# Patient Record
Sex: Female | Born: 1970
Health system: Southern US, Community
[De-identification: ages and names within clinical notes are randomized; demographics above are authoritative.]

## PROBLEM LIST (undated history)

## (undated) DIAGNOSIS — R1310 Dysphagia, unspecified: Secondary | ICD-10-CM

## (undated) DIAGNOSIS — F32A Depression, unspecified: Secondary | ICD-10-CM

## (undated) DIAGNOSIS — I10 Essential (primary) hypertension: Secondary | ICD-10-CM

## (undated) DIAGNOSIS — K5900 Constipation, unspecified: Secondary | ICD-10-CM

## (undated) DIAGNOSIS — R51 Headache: Secondary | ICD-10-CM

## (undated) DIAGNOSIS — M2550 Pain in unspecified joint: Secondary | ICD-10-CM

## (undated) DIAGNOSIS — G4733 Obstructive sleep apnea (adult) (pediatric): Secondary | ICD-10-CM

## (undated) DIAGNOSIS — F410 Panic disorder [episodic paroxysmal anxiety] without agoraphobia: Secondary | ICD-10-CM

## (undated) DIAGNOSIS — M549 Dorsalgia, unspecified: Secondary | ICD-10-CM

## (undated) DIAGNOSIS — F419 Anxiety disorder, unspecified: Secondary | ICD-10-CM

## (undated) DIAGNOSIS — R519 Headache, unspecified: Secondary | ICD-10-CM

## (undated) DIAGNOSIS — R079 Chest pain, unspecified: Secondary | ICD-10-CM

## (undated) DIAGNOSIS — E88819 Insulin resistance, unspecified: Secondary | ICD-10-CM

## (undated) DIAGNOSIS — R609 Edema, unspecified: Secondary | ICD-10-CM

## (undated) DIAGNOSIS — R0602 Shortness of breath: Secondary | ICD-10-CM

## (undated) DIAGNOSIS — R112 Nausea with vomiting, unspecified: Secondary | ICD-10-CM

## (undated) DIAGNOSIS — M1990 Unspecified osteoarthritis, unspecified site: Secondary | ICD-10-CM

## (undated) DIAGNOSIS — E039 Hypothyroidism, unspecified: Secondary | ICD-10-CM

## (undated) DIAGNOSIS — E8881 Metabolic syndrome: Secondary | ICD-10-CM

## (undated) DIAGNOSIS — K219 Gastro-esophageal reflux disease without esophagitis: Secondary | ICD-10-CM

## (undated) DIAGNOSIS — G43909 Migraine, unspecified, not intractable, without status migrainosus: Secondary | ICD-10-CM

## (undated) DIAGNOSIS — F329 Major depressive disorder, single episode, unspecified: Secondary | ICD-10-CM

## (undated) DIAGNOSIS — Z9889 Other specified postprocedural states: Secondary | ICD-10-CM

## (undated) HISTORY — DX: Chest pain, unspecified: R07.9

## (undated) HISTORY — DX: Depression, unspecified: F32.A

## (undated) HISTORY — DX: Headache, unspecified: R51.9

## (undated) HISTORY — DX: Gastro-esophageal reflux disease without esophagitis: K21.9

## (undated) HISTORY — DX: Obstructive sleep apnea (adult) (pediatric): G47.33

## (undated) HISTORY — DX: Headache: R51

## (undated) HISTORY — DX: Hypothyroidism, unspecified: E03.9

## (undated) HISTORY — PX: MINOR CARPAL TUNNEL: SHX6472

## (undated) HISTORY — DX: Unspecified osteoarthritis, unspecified site: M19.90

## (undated) HISTORY — DX: Major depressive disorder, single episode, unspecified: F32.9

## (undated) HISTORY — DX: Pain in unspecified joint: M25.50

## (undated) HISTORY — DX: Panic disorder (episodic paroxysmal anxiety): F41.0

## (undated) HISTORY — PX: WISDOM TOOTH EXTRACTION: SHX21

## (undated) HISTORY — DX: Dysphagia, unspecified: R13.10

## (undated) HISTORY — DX: Edema, unspecified: R60.9

## (undated) HISTORY — DX: Insulin resistance, unspecified: E88.819

## (undated) HISTORY — DX: Metabolic syndrome: E88.81

## (undated) HISTORY — PX: ELBOW SURGERY: SHX618

## (undated) HISTORY — DX: Anxiety disorder, unspecified: F41.9

## (undated) HISTORY — DX: Dorsalgia, unspecified: M54.9

## (undated) HISTORY — DX: Shortness of breath: R06.02

## (undated) HISTORY — DX: Constipation, unspecified: K59.00

## (undated) HISTORY — DX: Migraine, unspecified, not intractable, without status migrainosus: G43.909

---

## 2012-07-14 DIAGNOSIS — M129 Arthropathy, unspecified: Secondary | ICD-10-CM | POA: Insufficient documentation

## 2012-07-14 DIAGNOSIS — R112 Nausea with vomiting, unspecified: Secondary | ICD-10-CM | POA: Insufficient documentation

## 2012-07-14 DIAGNOSIS — R252 Cramp and spasm: Secondary | ICD-10-CM | POA: Insufficient documentation

## 2012-07-14 DIAGNOSIS — B349 Viral infection, unspecified: Secondary | ICD-10-CM | POA: Insufficient documentation

## 2012-07-14 DIAGNOSIS — R197 Diarrhea, unspecified: Secondary | ICD-10-CM | POA: Insufficient documentation

## 2013-03-26 ENCOUNTER — Ambulatory Visit: Payer: BC Managed Care – PPO | Admitting: Physical Therapy

## 2013-04-01 ENCOUNTER — Ambulatory Visit: Payer: BC Managed Care – PPO | Attending: Family Medicine | Admitting: Rehabilitation

## 2013-04-01 DIAGNOSIS — M6281 Muscle weakness (generalized): Secondary | ICD-10-CM | POA: Diagnosis not present

## 2013-04-01 DIAGNOSIS — IMO0001 Reserved for inherently not codable concepts without codable children: Secondary | ICD-10-CM | POA: Diagnosis present

## 2013-04-01 DIAGNOSIS — M25569 Pain in unspecified knee: Secondary | ICD-10-CM | POA: Diagnosis not present

## 2013-04-04 ENCOUNTER — Ambulatory Visit: Payer: BC Managed Care – PPO | Admitting: Rehabilitation

## 2013-04-08 ENCOUNTER — Ambulatory Visit: Payer: BC Managed Care – PPO | Attending: Family Medicine | Admitting: Rehabilitation

## 2013-04-08 DIAGNOSIS — M6281 Muscle weakness (generalized): Secondary | ICD-10-CM | POA: Insufficient documentation

## 2013-04-08 DIAGNOSIS — M25569 Pain in unspecified knee: Secondary | ICD-10-CM | POA: Diagnosis not present

## 2013-04-08 DIAGNOSIS — IMO0001 Reserved for inherently not codable concepts without codable children: Secondary | ICD-10-CM | POA: Insufficient documentation

## 2013-04-11 ENCOUNTER — Ambulatory Visit: Payer: BC Managed Care – PPO | Admitting: Rehabilitation

## 2013-04-11 DIAGNOSIS — IMO0001 Reserved for inherently not codable concepts without codable children: Secondary | ICD-10-CM | POA: Diagnosis not present

## 2013-04-15 ENCOUNTER — Ambulatory Visit: Payer: BC Managed Care – PPO | Admitting: Rehabilitation

## 2013-04-18 ENCOUNTER — Ambulatory Visit: Payer: BC Managed Care – PPO | Admitting: Rehabilitation

## 2013-04-18 DIAGNOSIS — IMO0001 Reserved for inherently not codable concepts without codable children: Secondary | ICD-10-CM | POA: Diagnosis not present

## 2013-04-22 ENCOUNTER — Ambulatory Visit: Payer: BC Managed Care – PPO | Admitting: Rehabilitation

## 2013-04-23 ENCOUNTER — Ambulatory Visit: Payer: BC Managed Care – PPO | Admitting: Rehabilitation

## 2013-04-23 DIAGNOSIS — IMO0001 Reserved for inherently not codable concepts without codable children: Secondary | ICD-10-CM | POA: Diagnosis not present

## 2013-04-25 ENCOUNTER — Ambulatory Visit: Payer: BC Managed Care – PPO | Admitting: Rehabilitation

## 2013-04-30 ENCOUNTER — Encounter: Payer: BC Managed Care – PPO | Admitting: Rehabilitation

## 2013-05-03 ENCOUNTER — Ambulatory Visit: Payer: BC Managed Care – PPO | Admitting: Rehabilitation

## 2013-05-08 ENCOUNTER — Ambulatory Visit: Payer: BC Managed Care – PPO | Attending: Family Medicine | Admitting: Rehabilitation

## 2013-05-08 DIAGNOSIS — IMO0001 Reserved for inherently not codable concepts without codable children: Secondary | ICD-10-CM | POA: Insufficient documentation

## 2013-05-08 DIAGNOSIS — M6281 Muscle weakness (generalized): Secondary | ICD-10-CM | POA: Insufficient documentation

## 2013-05-08 DIAGNOSIS — M25569 Pain in unspecified knee: Secondary | ICD-10-CM | POA: Insufficient documentation

## 2013-05-14 ENCOUNTER — Ambulatory Visit: Payer: BC Managed Care – PPO | Admitting: Rehabilitation

## 2013-05-20 ENCOUNTER — Ambulatory Visit: Payer: BC Managed Care – PPO | Admitting: Rehabilitation

## 2013-05-23 ENCOUNTER — Ambulatory Visit: Payer: BC Managed Care – PPO | Admitting: Rehabilitation

## 2013-05-27 ENCOUNTER — Ambulatory Visit: Payer: BC Managed Care – PPO | Admitting: Rehabilitation

## 2013-05-30 ENCOUNTER — Ambulatory Visit: Payer: BC Managed Care – PPO | Admitting: Rehabilitation

## 2013-06-13 DIAGNOSIS — S92009A Unspecified fracture of unspecified calcaneus, initial encounter for closed fracture: Secondary | ICD-10-CM | POA: Insufficient documentation

## 2013-09-12 DIAGNOSIS — M2240 Chondromalacia patellae, unspecified knee: Secondary | ICD-10-CM | POA: Insufficient documentation

## 2013-12-12 DIAGNOSIS — R12 Heartburn: Secondary | ICD-10-CM | POA: Insufficient documentation

## 2013-12-12 DIAGNOSIS — R519 Headache, unspecified: Secondary | ICD-10-CM | POA: Insufficient documentation

## 2013-12-12 DIAGNOSIS — I10 Essential (primary) hypertension: Secondary | ICD-10-CM | POA: Insufficient documentation

## 2013-12-12 DIAGNOSIS — R51 Headache: Secondary | ICD-10-CM

## 2015-04-30 DIAGNOSIS — G5600 Carpal tunnel syndrome, unspecified upper limb: Secondary | ICD-10-CM | POA: Insufficient documentation

## 2015-04-30 DIAGNOSIS — G5603 Carpal tunnel syndrome, bilateral upper limbs: Secondary | ICD-10-CM | POA: Insufficient documentation

## 2015-07-07 ENCOUNTER — Encounter (HOSPITAL_BASED_OUTPATIENT_CLINIC_OR_DEPARTMENT_OTHER): Payer: Self-pay | Admitting: Emergency Medicine

## 2015-07-07 ENCOUNTER — Emergency Department (HOSPITAL_BASED_OUTPATIENT_CLINIC_OR_DEPARTMENT_OTHER): Payer: Self-pay

## 2015-07-07 ENCOUNTER — Emergency Department (HOSPITAL_BASED_OUTPATIENT_CLINIC_OR_DEPARTMENT_OTHER)
Admission: EM | Admit: 2015-07-07 | Discharge: 2015-07-07 | Disposition: A | Discharge status: Home / Self Care | Payer: Self-pay | Attending: Emergency Medicine | Admitting: Emergency Medicine

## 2015-07-07 DIAGNOSIS — I10 Essential (primary) hypertension: Secondary | ICD-10-CM | POA: Insufficient documentation

## 2015-07-07 DIAGNOSIS — R079 Chest pain, unspecified: Secondary | ICD-10-CM | POA: Insufficient documentation

## 2015-07-07 DIAGNOSIS — Z79899 Other long term (current) drug therapy: Secondary | ICD-10-CM | POA: Insufficient documentation

## 2015-07-07 HISTORY — DX: Essential (primary) hypertension: I10

## 2015-07-07 LAB — BASIC METABOLIC PANEL
Anion gap: 5 (ref 5–15)
BUN: 14 mg/dL (ref 6–20)
CO2: 27 mmol/L (ref 22–32)
Calcium: 8.7 mg/dL — ABNORMAL LOW (ref 8.9–10.3)
Chloride: 106 mmol/L (ref 101–111)
Creatinine, Ser: 0.81 mg/dL (ref 0.44–1.00)
GFR calc Af Amer: >60 mL/min (ref >60)
GFR calc non Af Amer: >60 mL/min (ref >60)
Glucose, Bld: 76 mg/dL (ref 65–99)
Potassium: 3.6 mmol/L (ref 3.5–5.1)
Sodium: 138 mmol/L (ref 135–145)

## 2015-07-07 LAB — CBC
HCT: 37.9 % (ref 36.0–46.0)
Hemoglobin: 12.8 g/dL (ref 12.0–15.0)
MCH: 29.4 pg (ref 26.0–34.0)
MCHC: 33.8 g/dL (ref 30.0–36.0)
MCV: 87.1 fL (ref 78.0–100.0)
Platelets: 301 10*3/uL (ref 150–400)
RBC: 4.35 MIL/uL (ref 3.87–5.11)
RDW: 13.5 % (ref 11.5–15.5)
WBC: 5 10*3/uL (ref 4.0–10.5)

## 2015-07-07 LAB — TROPONIN I: Troponin I: <0.03 ng/mL (ref <0.031)

## 2015-07-07 MED ORDER — ASPIRIN 81 MG PO CHEW
324.0000 mg | CHEWABLE_TABLET | Freq: Once | ORAL | Status: AC
  Administered 2015-07-07: 324 mg via ORAL
  Filled 2015-07-07: qty 4

## 2015-07-07 NOTE — ED Notes (Signed)
Pt states she did not feel well this am.  Started to have centralized chest pain radiating to left arm.  Pt states she did have diaphoresis and nausea.

## 2015-07-07 NOTE — Discharge Instructions (Signed)
Nonspecific Chest Pain  °Chest pain can be caused by many different conditions. There is always a chance that your pain could be related to something serious, such as a heart attack or a blood clot in your lungs. Chest pain can also be caused by conditions that are not life-threatening. If you have chest pain, it is very important to follow up with your health care provider. °CAUSES  °Chest pain can be caused by: °· Heartburn. °· Pneumonia or bronchitis. °· Anxiety or stress. °· Inflammation around your heart (pericarditis) or lung (pleuritis or pleurisy). °· A blood clot in your lung. °· A collapsed lung (pneumothorax). It can develop suddenly on its own (spontaneous pneumothorax) or from trauma to the chest. °· Shingles infection (varicella-zoster virus). °· Heart attack. °· Damage to the bones, muscles, and cartilage that make up your chest wall. This can include: °¨ Bruised bones due to injury. °¨ Strained muscles or cartilage due to frequent or repeated coughing or overwork. °¨ Fracture to one or more ribs. °¨ Sore cartilage due to inflammation (costochondritis). °RISK FACTORS  °Risk factors for chest pain may include: °· Activities that increase your risk for trauma or injury to your chest. °· Respiratory infections or conditions that cause frequent coughing. °· Medical conditions or overeating that can cause heartburn. °· Heart disease or family history of heart disease. °· Conditions or health behaviors that increase your risk of developing a blood clot. °· Having had chicken pox (varicella zoster). °SIGNS AND SYMPTOMS °Chest pain can feel like: °· Burning or tingling on the surface of your chest or deep in your chest. °· Crushing, pressure, aching, or squeezing pain. °· Dull or sharp pain that is worse when you move, cough, or take a deep breath. °· Pain that is also felt in your back, neck, shoulder, or arm, or pain that spreads to any of these areas. °Your chest pain may come and go, or it may stay  constant. °DIAGNOSIS °Lab tests or other studies may be needed to find the cause of your pain. Your health care provider may have you take a test called an ambulatory ECG (electrocardiogram). An ECG records your heartbeat patterns at the time the test is performed. You may also have other tests, such as: °· Transthoracic echocardiogram (TTE). During echocardiography, sound waves are used to create a picture of all of the heart structures and to look at how blood flows through your heart. °· Transesophageal echocardiogram (TEE). This is a more advanced imaging test that obtains images from inside your body. It allows your health care provider to see your heart in finer detail. °· Cardiac monitoring. This allows your health care provider to monitor your heart rate and rhythm in real time. °· Holter monitor. This is a portable device that records your heartbeat and can help to diagnose abnormal heartbeats. It allows your health care provider to track your heart activity for several days, if needed. °· Stress tests. These can be done through exercise or by taking medicine that makes your heart beat more quickly. °· Blood tests. °· Imaging tests. °TREATMENT  °Your treatment depends on what is causing your chest pain. Treatment may include: °· Medicines. These may include: °¨ Acid blockers for heartburn. °¨ Anti-inflammatory medicine. °¨ Pain medicine for inflammatory conditions. °¨ Antibiotic medicine, if an infection is present. °¨ Medicines to dissolve blood clots. °¨ Medicines to treat coronary artery disease. °· Supportive care for conditions that do not require medicines. This may include: °¨ Resting. °¨ Applying heat   or cold packs to injured areas. °¨ Limiting activities until pain decreases. °HOME CARE INSTRUCTIONS °· If you were prescribed an antibiotic medicine, finish it all even if you start to feel better. °· Avoid any activities that bring on chest pain. °· Do not use any tobacco products, including  cigarettes, chewing tobacco, or electronic cigarettes. If you need help quitting, ask your health care provider. °· Do not drink alcohol. °· Take medicines only as directed by your health care provider. °· Keep all follow-up visits as directed by your health care provider. This is important. This includes any further testing if your chest pain does not go away. °· If heartburn is the cause for your chest pain, you may be told to keep your head raised (elevated) while sleeping. This reduces the chance that acid will go from your stomach into your esophagus. °· Make lifestyle changes as directed by your health care provider. These may include: °¨ Getting regular exercise. Ask your health care provider to suggest some activities that are safe for you. °¨ Eating a heart-healthy diet. A registered dietitian can help you to learn healthy eating options. °¨ Maintaining a healthy weight. °¨ Managing diabetes, if necessary. °¨ Reducing stress. °SEEK MEDICAL CARE IF: °· Your chest pain does not go away after treatment. °· You have a rash with blisters on your chest. °· You have a fever. °SEEK IMMEDIATE MEDICAL CARE IF:  °· Your chest pain is worse. °· You have an increasing cough, or you cough up blood. °· You have severe abdominal pain. °· You have severe weakness. °· You faint. °· You have chills. °· You have sudden, unexplained chest discomfort. °· You have sudden, unexplained discomfort in your arms, back, neck, or jaw. °· You have shortness of breath at any time. °· You suddenly start to sweat, or your skin gets clammy. °· You feel nauseous or you vomit. °· You suddenly feel light-headed or dizzy. °· Your heart begins to beat quickly, or it feels like it is skipping beats. °These symptoms may represent a serious problem that is an emergency. Do not wait to see if the symptoms will go away. Get medical help right away. Call your local emergency services (911 in the [place] Do not drive yourself to the hospital. °  °This  information is not intended to replace advice given to you by your health care provider. Make sure you discuss any questions you have with your health care provider. °  °Document Released: 11/03/2004 Document Revised: 02/14/2014 Document Reviewed: 08/30/2013 °Elsevier Interactive Patient Education ©2016 [employer] ° °

## 2015-07-07 NOTE — ED Provider Notes (Signed)
CSN: [id number]     Arrival date & time 07/07/15  1008 History   First MD Initiated Contact with Patient 07/07/15 1028     Chief Complaint  Patient presents with  . Chest Pain     (Consider location/radiation/quality/duration/timing/severity/associated sxs/prior Treatment) HPI   43yF with CP. 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 Pressure in the center/L chest and radiated into LUE. Associated with dyspnea. Says is felt like she could not get a deep breath. Also was sweaty and felt nauseated. Symptoms resolved over about 30 minutes. Currently feels better but not quite back to normal. Denies continued pain or dyspnea. Hx of HTN. No known CAD. Her mother has had a heart attack but she is unsure of age when diagnosed with heart disease. Non smoker. No cough. No unusual leg pain or swelling.  Past Medical History  Diagnosis Date  . Hypertension   . Thyroid disease    Past Surgical History  Procedure Laterality Date  . Cesarean section     No family history on file. Social History  Substance Use Topics  . Smoking status: Never Smoker   . Smokeless tobacco: None  . Alcohol Use: None   OB History    No data available     Review of Systems  All systems reviewed and negative, other than as noted in HPI.   Allergies  Sulfa antibiotics  Home Medications   Prior to Admission medications   Medication Sig Start Date End Date Taking? Authorizing Provider  hydrochlorothiazide (HYDRODIURIL) 25 MG tablet Take 25 mg by mouth daily.   Yes Historical Provider, MD  levothyroxine (SYNTHROID, LEVOTHROID) 100 MCG tablet Take 100 mcg by mouth daily before breakfast.   Yes Historical Provider, MD  lisinopril (PRINIVIL,ZESTRIL) 10 MG tablet Take 10 mg by mouth daily.   Yes Historical Provider, MD  omeprazole (PRILOSEC) 20 MG capsule Take 20 mg by mouth daily.   Yes Historical Provider, MD  UNKNOWN TO PATIENT Birth control   Yes Historical Provider, MD   BP 136/84  mmHg  Pulse 58  Temp(Src) 98.5 F (36.9 C) (Oral)  Resp 8  Ht 5\' 2"  (1.575 m)  Wt 279 lb 11.2 oz (126.871 kg)  BMI 51.14 kg/m2  SpO2 100%  LMP 06/23/2015 (Approximate) Physical Exam  Constitutional: She appears well-developed and well-nourished. No distress.  Laying in bed. NAD. Obese.  HENT:  Head: Normocephalic and atraumatic.  Eyes: Conjunctivae are normal. Right eye exhibits no discharge. Left eye exhibits no discharge.  Neck: Neck supple.  Cardiovascular: Normal rate, regular rhythm and normal heart sounds.  Exam reveals no gallop and no friction rub.   No murmur heard. Pulmonary/Chest: Effort normal and breath sounds normal. No respiratory distress.  Abdominal: Soft. She exhibits no distension. There is no tenderness.  Musculoskeletal: She exhibits no edema or tenderness.  Lower extremities symmetric as compared to each other. No calf tenderness. Negative Homan's. No palpable cords.   Neurological: She is alert.  Skin: Skin is warm and dry.  Psychiatric: She has a normal mood and affect. Her behavior is normal. Thought content normal.  Nursing note and vitals reviewed.   ED Course  Procedures (including critical care time) Labs Review Labs Reviewed  BASIC METABOLIC PANEL - Abnormal; Notable for the following:    Calcium 8.7 (*)    All other components within normal limits  CBC  TROPONIN I    Imaging Review Dg Chest 2 View  07/07/2015  CLINICAL DATA:  Chest tightness and left arm pain with shortness of breath, initial encounter EXAM: CHEST  2 VIEW COMPARISON:  None. FINDINGS: The heart size and mediastinal contours are within normal limits. Both lungs are clear. The visualized skeletal structures are unremarkable. IMPRESSION: No active cardiopulmonary disease. Electronically Signed   By: Inez Catalina M.D.   On: 07/07/2015 11:10   I have personally reviewed and evaluated these images and lab results as part of my medical decision-making.   EKG  Interpretation   Date/Time:  Tuesday Jul 07 2015 10:15:14 EDT Ventricular Rate:  70 PR Interval:  164 QRS Duration: 78 QT Interval:  422 QTC Calculation: 455 R Axis:   28 Text Interpretation:  Normal sinus rhythm Low voltage QRS No old tracing  to compare Confirmed by Wilson Singer  MD, Pegram ([id number]) on 07/07/2015 10:29:29 AM  Also confirmed by Wilson Singer  MD, Kerrigan Glendening ([id number]), editor Yehuda Mao  [phone])  on 07/07/2015 10:46:40 AM      MDM   Final diagnoses:  Chest pain, unspecified chest pain type    44yF with CP. Initial work-up unremarkable, but her HPI has several concerning elements. Advised admission for r/o. She declines. She understands my concerns for possible ACS and the benefit of staying and risks of leaving including but not limited to MI/death. Advised to get a stress test as soon as she can arrange. Return to ED immediately for any concerns symptoms in mean time.   Virgel Manifold, MD 07/08/15 [phone]

## 2015-07-14 ENCOUNTER — Encounter (HOSPITAL_BASED_OUTPATIENT_CLINIC_OR_DEPARTMENT_OTHER): Payer: Self-pay | Admitting: *Deleted

## 2015-07-14 ENCOUNTER — Emergency Department (HOSPITAL_BASED_OUTPATIENT_CLINIC_OR_DEPARTMENT_OTHER)
Admission: EM | Admit: 2015-07-14 | Discharge: 2015-07-14 | Disposition: A | Discharge status: Home / Self Care | Payer: BLUE CROSS/BLUE SHIELD | Attending: Emergency Medicine | Admitting: Emergency Medicine

## 2015-07-14 ENCOUNTER — Emergency Department (HOSPITAL_BASED_OUTPATIENT_CLINIC_OR_DEPARTMENT_OTHER): Payer: BLUE CROSS/BLUE SHIELD

## 2015-07-14 DIAGNOSIS — Z79899 Other long term (current) drug therapy: Secondary | ICD-10-CM | POA: Diagnosis not present

## 2015-07-14 DIAGNOSIS — I10 Essential (primary) hypertension: Secondary | ICD-10-CM | POA: Diagnosis not present

## 2015-07-14 DIAGNOSIS — E079 Disorder of thyroid, unspecified: Secondary | ICD-10-CM | POA: Insufficient documentation

## 2015-07-14 DIAGNOSIS — M79632 Pain in left forearm: Secondary | ICD-10-CM | POA: Diagnosis not present

## 2015-07-14 MED ORDER — NAPROXEN 250 MG PO TABS
500.0000 mg | ORAL_TABLET | Freq: Once | ORAL | Status: AC
  Administered 2015-07-14: 500 mg via ORAL
  Filled 2015-07-14: qty 2

## 2015-07-14 NOTE — ED Provider Notes (Signed)
CSN: [id number]     Arrival date & time 07/14/15  1335 History   First MD Initiated Contact with Patient 07/14/15 1417     Chief Complaint  Patient presents with  . Arm Pain     (Consider location/radiation/quality/duration/timing/severity/associated sxs/prior Treatment) HPI 45 year old female who presents with left forearm and hand pain. History of hypertension and thyroid disease. States several month history of intermittent numbness of the left arm and one week of increased pain in the left hand and arm. States for several months, she would have tingling and "falling asleep" of her left forearm randomly while her arm is at rest. She would move it and shake it, and it would improve. Her doctor told her to wear a brace, but this feeling would intermittently continue. She was seen in ED one week ago with chest pain after building a grill. At time, had forearm pain. Left AMA, and states chest pain resolved. Continued left forearm pain, described as soreness, worse with palpation and movement. Denies numbness or true weakness of the hand, but states hand seems stiffer and difficult to move and she noticed that hand is mildly more swollen than the right.  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   Past Medical History  Diagnosis Date  . Hypertension   . Thyroid disease    Past Surgical History  Procedure Laterality Date  . Cesarean section     No family history on file. Social History  Substance Use Topics  . Smoking status: Never Smoker   . Smokeless tobacco: None  . Alcohol Use: No   OB History    No data available     Review of Systems 10/14 systems reviewed and are negative other than those stated in the HPI   Allergies  Sulfa antibiotics  Home Medications   Prior to Admission medications   Medication Sig Start Date End Date Taking? Authorizing Provider  hydrochlorothiazide (HYDRODIURIL) 25 MG tablet Take 25 mg by mouth daily.     Historical Provider, MD  levothyroxine (SYNTHROID, LEVOTHROID) 100 MCG tablet Take 100 mcg by mouth daily before breakfast.    Historical Provider, MD  lisinopril (PRINIVIL,ZESTRIL) 10 MG tablet Take 10 mg by mouth daily.    Historical Provider, MD  omeprazole (PRILOSEC) 20 MG capsule Take 20 mg by mouth daily.    Historical Provider, MD  UNKNOWN TO PATIENT Birth control    Historical Provider, MD   BP 155/105 mmHg  Pulse 73  Temp(Src) 98.6 F (37 C) (Oral)  Resp 18  Ht 5\' 2"  (1.575 m)  Wt 279 lb (126.554 kg)  BMI 51.02 kg/m2  SpO2 99%  LMP 06/23/2015 (Approximate) Physical Exam Physical Exam  Nursing note and vitals reviewed. Constitutional: Well developed, well nourished, non-toxic, and in no acute distress Head: Normocephalic and atraumatic.  Mouth/Throat: Oropharynx is clear and moist.  Neck: Normal range of motion. Neck supple.  Cardiovascular: Normal rate and regular rhythm.  +2 radial pulse Pulmonary/Chest: Effort normal and breath sounds normal.  Abdominal: Soft. There is no tenderness. There is no rebound and no guarding.  Musculoskeletal: Normal range of motion with pain with forearm supination/pronation and soreness of wrist and fingers with ROM. Tender to palpation of forearm muscles.  Neurological: Alert, no facial droop, fluent speech, Sensation intact in all 4 extremities, no dysmetria with finger to nose, no pronator drift, intact innervation involving radial, ulnar, and median nerves bilaterally. Skin: Skin is warm and dry.  Psychiatric:  Cooperative  ED Course  Procedures (including critical care time) Labs Review Labs Reviewed - No data to display  Imaging Review No results found. I have personally reviewed and evaluated these images and lab results as part of my medical decision-making.   EKG Interpretation None      MDM   Final diagnoses:  None    45 year old female with presents with one week of left arm pain and soreness. On presentation  well-appearing in no acute distress with non-concerning vital signs. She has normal neurological exam. History and exam is not suggestive of true numbness and weakness of the left arm. She does have some intermittent tingling of the left forearm in the setting of likely peripheral nerve compression when she does not move her arm, but this sensation goes away when she moves her arm. She does reproducible pain with palpation of her forearm arm muscles as well as forearm supination/pronation and wrist and finger extension and flexion. This is all musculoskeletal pain likely in the setting of her repetitive activity building grills recently. She does have some minimal soft tissue swelling of the left hand. Extremity is neurovascularly in tact. With mild swelling [name] for DVT performed and negative. She is stable for discharge home with continued supportive care. Strict return and follow-up instructions are reviewed. She expressed understanding of all discharge instructions, and felt comfortable with the plan of care.    Forde Dandy, MD 07/14/15 [phone]

## 2015-07-14 NOTE — Discharge Instructions (Signed)
Avoid repetitive activity with the left arm. Ice or apply heat packs. Take NSAIDs and tylenol for pain control. Return for worsening symptoms, including worsening swelling or pain, or any other symptoms concerning to you.  Musculoskeletal Pain Musculoskeletal pain is muscle and boney aches and pains. These pains can occur in any part of the body. Your caregiver may treat you without knowing the cause of the pain. They may treat you if blood or urine tests, X-rays, and other tests were normal.  CAUSES There is often not a definite cause or reason for these pains. These pains may be caused by a type of germ (virus). The discomfort may also come from overuse. Overuse includes working out too hard when your body is not fit. Boney aches also come from weather changes. Bone is sensitive to atmospheric pressure changes. HOME CARE INSTRUCTIONS   Ask when your test results will be ready. Make sure you get your test results.  Only take over-the-counter or prescription medicines for pain, discomfort, or fever as directed by your caregiver. If you were given medications for your condition, do not drive, operate machinery or power tools, or sign legal documents for 24 hours. Do not drink alcohol. Do not take sleeping pills or other medications that may interfere with treatment.  Continue all activities unless the activities cause more pain. When the pain lessens, slowly resume normal activities. Gradually increase the intensity and duration of the activities or exercise.  During periods of severe pain, bed rest may be helpful. Lay or sit in any position that is comfortable.  Putting ice on the injured area.  Put ice in a bag.  Place a towel between your skin and the bag.  Leave the ice on for 15 to 20 minutes, 3 to 4 times a day.  Follow up with your caregiver for continued problems and no reason can be found for the pain. If the pain becomes worse or does not go away, it may be necessary to repeat tests  or do additional testing. Your caregiver may need to look further for a possible cause. SEEK IMMEDIATE MEDICAL CARE IF:  You have pain that is getting worse and is not relieved by medications.  You develop chest pain that is associated with shortness or breath, sweating, feeling sick to your stomach (nauseous), or throw up (vomit).  Your pain becomes localized to the abdomen.  You develop any new symptoms that seem different or that concern you. MAKE SURE YOU:   Understand these instructions.  Will watch your condition.  Will get help right away if you are not doing well or get worse.   This information is not intended to replace advice given to you by your health care provider. Make sure you discuss any questions you have with your health care provider.   Document Released: 01/24/2005 Document Revised: 04/18/2011 Document Reviewed: 09/28/2012 Elsevier Interactive Patient Education [employer].

## 2015-07-14 NOTE — ED Notes (Signed)
Left arm pain for a week. She has been numbness on and off in same arm for 2 days with weak grip. No injury. She was seen for same pain along with chest pain a week ago and had a negative exam.

## 2015-07-21 ENCOUNTER — Telehealth: Payer: Self-pay | Admitting: *Deleted

## 2015-07-21 ENCOUNTER — Encounter: Payer: Self-pay | Admitting: *Deleted

## 2015-07-21 NOTE — Telephone Encounter (Signed)
Lost connection on call, called pt back and pt did not answer. LM to return call to complete Pre-Visit Info if available.

## 2015-07-22 ENCOUNTER — Encounter: Payer: Self-pay | Admitting: Physician Assistant

## 2015-07-22 ENCOUNTER — Ambulatory Visit (INDEPENDENT_AMBULATORY_CARE_PROVIDER_SITE_OTHER): Payer: BLUE CROSS/BLUE SHIELD | Admitting: Physician Assistant

## 2015-07-22 ENCOUNTER — Telehealth: Payer: Self-pay | Admitting: Physician Assistant

## 2015-07-22 VITALS — BP 148/98 | HR 67 | Temp 97.6°F | Resp 16 | Ht 62.0 in | Wt 282.2 lb

## 2015-07-22 DIAGNOSIS — G5602 Carpal tunnel syndrome, left upper limb: Secondary | ICD-10-CM

## 2015-07-22 DIAGNOSIS — Z8249 Family history of ischemic heart disease and other diseases of the circulatory system: Secondary | ICD-10-CM | POA: Diagnosis not present

## 2015-07-22 DIAGNOSIS — R079 Chest pain, unspecified: Secondary | ICD-10-CM

## 2015-07-22 DIAGNOSIS — M7711 Lateral epicondylitis, right elbow: Secondary | ICD-10-CM

## 2015-07-22 DIAGNOSIS — E039 Hypothyroidism, unspecified: Secondary | ICD-10-CM | POA: Diagnosis not present

## 2015-07-22 DIAGNOSIS — I10 Essential (primary) hypertension: Secondary | ICD-10-CM | POA: Diagnosis not present

## 2015-07-22 DIAGNOSIS — M7710 Lateral epicondylitis, unspecified elbow: Secondary | ICD-10-CM | POA: Insufficient documentation

## 2015-07-22 LAB — COMPREHENSIVE METABOLIC PANEL
ALBUMIN: 3.8 g/dL (ref 3.5–5.2)
ALT: 14 U/L (ref 0–35)
AST: 13 U/L (ref 0–37)
Alkaline Phosphatase: 46 U/L (ref 39–117)
BUN: 12 mg/dL (ref 6–23)
CHLORIDE: 106 meq/L (ref 96–112)
CO2: 27 meq/L (ref 19–32)
Calcium: 8.9 mg/dL (ref 8.4–10.5)
Creatinine, Ser: 0.74 mg/dL (ref 0.40–1.20)
GFR: 90.77 mL/min (ref >60.00)
Glucose, Bld: 93 mg/dL (ref 70–99)
POTASSIUM: 4.2 meq/L (ref 3.5–5.1)
SODIUM: 136 meq/L (ref 135–145)
Total Bilirubin: 0.6 mg/dL (ref 0.2–1.2)
Total Protein: 6.9 g/dL (ref 6.0–8.3)

## 2015-07-22 LAB — LIPID PANEL
CHOL/HDL RATIO: 3
Cholesterol: 130 mg/dL (ref 0–200)
HDL: 37.2 mg/dL — AB (ref >39.00)
LDL Cholesterol: 82 mg/dL (ref 0–99)
NonHDL: 92.73
TRIGLYCERIDES: 56 mg/dL (ref 0.0–149.0)
VLDL: 11.2 mg/dL (ref 0.0–40.0)

## 2015-07-22 LAB — TSH: TSH: 8.42 u[IU]/mL — AB (ref 0.35–4.50)

## 2015-07-22 LAB — HEMOGLOBIN A1C: Hgb A1c MFr Bld: 4.7 % (ref 4.6–6.5)

## 2015-07-22 MED ORDER — OMEPRAZOLE 20 MG PO CPDR: 20.0000 mg | DELAYED_RELEASE_CAPSULE | Freq: Every day | ORAL | Status: DC

## 2015-07-22 MED ORDER — DICLOFENAC POTASSIUM 50 MG PO TABS: 50.0000 mg | ORAL_TABLET | Freq: Two times a day (BID) | ORAL | Status: DC

## 2015-07-22 MED ORDER — HYDROCHLOROTHIAZIDE 25 MG PO TABS: 25.0000 mg | ORAL_TABLET | Freq: Every day | ORAL | Status: DC

## 2015-07-22 MED ORDER — LISINOPRIL 20 MG PO TABS: 20.0000 mg | ORAL_TABLET | Freq: Every day | ORAL | Status: DC

## 2015-07-22 NOTE — Patient Instructions (Signed)
Please go to the lab for blood work. We will call with your results.  We are making a few changes:  1 - For blood pressure -- your BP is too high with current medication regimen. Continue your hydrochlorothiazide. I am increasing your lisinopril to 20 mg daily. I have sent in a new prescription. Follow the diet at the bottom of this visit summary. We are setting you up for a stress test. This is very important.  2 - For weight -- we need to reassess your thyroid function. I suspect your medication will need an adjustment. I cannot start medications now for obesity until we get your heart checked out and BP under control. Keep up with diet and exercise.  3 - For Carpal tunnel -- wear the wrist brace at night. Avoid heavy lifting. Take the diclofenac as directed with food. Apply topical Salon Pas to the area. If not improving we will have to set you up with a hand surgery.  4 - For Elbow -- Avoid heavy lifting. Apply Salon Pas to the area. The Diclofenac will also help with this. Let me know if this is not improving.  Follow-up with me in 2 weeks.  Don't forget to ask your HR about FMLA paperwork to be caregiver for your boyfriend.  DASH Eating Plan DASH stands for "Dietary Approaches to Stop Hypertension." The DASH eating plan is a healthy eating plan that has been shown to reduce high blood pressure (hypertension). Additional health benefits may include reducing the risk of type 2 diabetes mellitus, heart disease, and stroke. The DASH eating plan may also help with weight loss. WHAT DO I NEED TO KNOW ABOUT THE DASH EATING PLAN? For the DASH eating plan, you will follow these general guidelines:  Choose foods with a percent daily value for sodium of less than 5% (as listed on the food label).  Use salt-free seasonings or herbs instead of table salt or sea salt.  Check with your health care provider or pharmacist before using salt substitutes.  Eat lower-sodium products, often labeled as  "lower sodium" or "no salt added."  Eat fresh foods.  Eat more vegetables, fruits, and low-fat dairy products.  Choose whole grains. Look for the word "whole" as the first word in the ingredient list.  Choose fish and skinless chicken or [place] more often than red meat. Limit fish, poultry, and meat to 6 oz (170 g) each day.  Limit sweets, desserts, sugars, and sugary drinks.  Choose heart-healthy fats.  Limit cheese to 1 oz (28 g) per day.  Eat more home-cooked food and less restaurant, buffet, and fast food.  Limit fried foods.  Cook foods using methods other than frying.  Limit canned vegetables. If you do use them, rinse them well to decrease the sodium.  When eating at a restaurant, ask that your food be prepared with less salt, or no salt if possible. WHAT FOODS CAN I EAT? Seek help from a dietitian for individual calorie needs. Grains Whole grain or whole wheat bread. Brown rice. Whole grain or whole wheat pasta. Quinoa, bulgur, and whole grain cereals. Low-sodium cereals. Corn or whole wheat flour tortillas. Whole grain cornbread. Whole grain crackers. Low-sodium crackers. Vegetables Fresh or frozen vegetables (raw, steamed, roasted, or grilled). Low-sodium or reduced-sodium tomato and vegetable juices. Low-sodium or reduced-sodium tomato sauce and paste. Low-sodium or reduced-sodium canned vegetables.  Fruits All fresh, canned (in natural juice), or frozen fruits. Meat and Other Protein Products Ground beef (85% or leaner), grass-fed  beef, or beef trimmed of fat. Skinless chicken or [place]. Ground chicken or [place]. Pork trimmed of fat. All fish and seafood. Eggs. Dried beans, peas, or lentils. Unsalted nuts and seeds. Unsalted canned beans. Dairy Low-fat dairy products, such as skim or 1% milk, 2% or reduced-fat cheeses, low-fat ricotta or cottage cheese, or plain low-fat yogurt. Low-sodium or reduced-sodium cheeses. Fats and Oils Tub margarines without trans fats.  Light or reduced-fat mayonnaise and salad dressings (reduced sodium). Avocado. Safflower, olive, or canola oils. Natural peanut or almond butter. Other Unsalted popcorn and pretzels. The items listed above may not be a complete list of recommended foods or beverages. Contact your dietitian for more options. WHAT FOODS ARE NOT RECOMMENDED? Grains White bread. White pasta. White rice. Refined cornbread. Bagels and croissants. Crackers that contain trans fat. Vegetables Creamed or fried vegetables. Vegetables in a cheese sauce. Regular canned vegetables. Regular canned tomato sauce and paste. Regular tomato and vegetable juices. Fruits Dried fruits. Canned fruit in light or heavy syrup. Fruit juice. Meat and Other Protein Products Fatty cuts of meat. Ribs, chicken wings, bacon, sausage, bologna, salami, chitterlings, fatback, hot dogs, bratwurst, and packaged luncheon meats. Salted nuts and seeds. Canned beans with salt. Dairy Whole or 2% milk, cream, half-and-half, and cream cheese. Whole-fat or sweetened yogurt. Full-fat cheeses or blue cheese. Nondairy creamers and whipped toppings. Processed cheese, cheese spreads, or cheese curds. Condiments Onion and garlic salt, seasoned salt, table salt, and sea salt. Canned and packaged gravies. Worcestershire sauce. Tartar sauce. Barbecue sauce. Teriyaki sauce. Soy sauce, including reduced sodium. Steak sauce. Fish sauce. Oyster sauce. Cocktail sauce. Horseradish. Ketchup and mustard. Meat flavorings and tenderizers. Bouillon cubes. Hot sauce. Tabasco sauce. Marinades. Taco seasonings. Relishes. Fats and Oils Butter, stick margarine, lard, shortening, ghee, and bacon fat. Coconut, palm kernel, or palm oils. Regular salad dressings. Other Pickles and olives. Salted popcorn and pretzels. The items listed above may not be a complete list of foods and beverages to avoid. Contact your dietitian for more information. WHERE CAN I FIND MORE  INFORMATION? National Heart, Lung, and Blood Institute: travelstabloid.com   This information is not intended to replace advice given to you by your health care provider. Make sure you discuss any questions you have with your health care provider.   Document Released: 01/13/2011 Document Revised: 02/14/2014 Document Reviewed: 11/28/2012 Elsevier Interactive Patient Education [employer].

## 2015-07-22 NOTE — Telephone Encounter (Signed)
Thyroid lab is abnormal and patient would need urine pregnancy test before starting new Rx for birth control/SLS 06/14 [HCTZ and Omeprazole Rx request to pharmacy/SLS 06/14] Please Advise.

## 2015-07-22 NOTE — Telephone Encounter (Signed)
No refills were requested when patient was asked about refills at visit. As such no urine pregnancy was checked regarding birth control medications. She will have to give urine sample for urine pregnancy before I will refill.  In regards to the levothyroxine, her TSH was elevated. Need to increase to 125 mcg tablet daily. FU lab 6 weeks for repeat TSH. May have to tweak further. See result note for all other lab results from visit.

## 2015-07-22 NOTE — Progress Notes (Signed)
Pre visit review using our clinic review tool, if applicable. No additional management support is needed unless otherwise documented below in the visit note/SLS  

## 2015-07-22 NOTE — Telephone Encounter (Signed)
Relation to [id number] Call back [phone] Pharmacy: [id number] - [place], [place]  Reason for call:  Patient requesting a refill of the following medication:   hydrochlorothiazide (HYDRODIURIL) 25 MG tablet  norethindrone (JOLIVETTE) 0.35 MG tablet  levothyroxine (SYNTHROID, LEVOTHROID) 100 MCG tablet  omeprazole (PRILOSEC) 20 MG capsule

## 2015-07-22 NOTE — Progress Notes (Signed)
Patient presents to clinic today to establish care. Patient is also an ER follow-up -- 07/07/15 (chest pain), 07/14/15 (forearm pain).   Acute Concerns: Patient c/o pain in left wrist and forearm, sometimes radiating up to her elbow. Denies trauma or injury but does heavy lifting at work. Was seen in the ER this past week for this. Was told symptoms were muscular in nature. Supportive measures were reviewed with patient in the ER. Patient endorses hand is intermittent tingling with numbness in the first 3 fingers, sometimes the whole hand will go numb. Is alleviated if she shakes her hand. Endorses she was told before by another provider that she may have carpal tunnel syndrome.   Patient also c/o pain in R lateral elbow over the past week. Is only present with heavy lifting. Denies trauma or injury. Denies numbness or tingling. Has not taken anything for symptoms.   Chronic Issues: Hypertension -- Is currently on regimen of HCTZ 25 mg daily and Lisinopril 10 mg daily. Endorses taking daily as directed. Patient denies palpitations, lightheadedness, dizziness, vision changes. Does note frequent headaches. Mother just had another heart attack at 14. Strong family history of CAD. Patient denies history of DM or high cholesterol. Was seen in ER on 07/07/15 with an episode of chest pain radiating into LUE. Initial workup in ER -- EKG, CXR, troponin negative. Overnight monitoring and stress test recommended but patient left AMA. Denies recurrence of chest pain since that time.  Hypothyroidism -- Is currently on levothyroxine 100 mcg daily. Endorses taking as directed. Notes weight gain and fatigue. States she has not had TSH level checked in a few years.  Morbid Obesity -- Body mass index is 51.61 kg/(m^2). Is eating only baked foods. No fried of fast foods. Is working on fruits and veggie intake. Is drinking mostly water. Is cutting back on portion sizes. No exercise regimen at present.   Past Medical  History  Diagnosis Date  . Hypertension   . Thyroid disease   . GERD (gastroesophageal reflux disease)   . Arthritis   . Frequent headaches   . Migraines   . Panic anxiety syndrome   . Depression     Past Surgical History  Procedure Laterality Date  . Cesarean section  2001 & 2002  . Wisdom tooth extraction      Current Outpatient Prescriptions on File Prior to Visit  Medication Sig Dispense Refill  . norethindrone (JOLIVETTE) 0.35 MG tablet      No current facility-administered medications on file prior to visit.    Allergies  Allergen Reactions  . Adhesive [Tape] Other (See Comments)    Skin Burn.  . Sulfa Antibiotics Rash    Family History  Problem Relation Age of Onset  . Hypertension Mother     Living  . Hypertension Father     Stepfather  . Hypertension Maternal Grandmother   . Cancer Maternal Grandfather   . Hypertension Maternal Grandfather   . Hypertension Paternal Grandmother   . Hypertension Paternal Grandfather   . Arthritis Mother   . Thyroid disease Mother   . Heart disease Mother   . Heart attack Mother   . Parkinson's disease Maternal Grandmother   . Alzheimer's disease Maternal Grandmother   . Diabetes Neg Hx   . Gout Brother   . ADD / ADHD Son     x1  . Other Son     #2-Unknown    Social History   Social History  . Marital Status: Single  Spouse Name: N/A  . Number of Children: N/A  . Years of Education: N/A   Occupational History  . Not on file.   Social History Main Topics  . Smoking status: Former Research scientist (life sciences)  . Smokeless tobacco: Never Used     Comment: Quit >10 years ago  . Alcohol Use: No  . Drug Use: No  . Sexual Activity:    Partners: Female     Comment: OCP   Other Topics Concern  . Not on file   Social History Narrative    Review of Systems  Constitutional: Positive for malaise/fatigue. Negative for fever and weight loss.  HENT: Negative for ear discharge, ear pain, hearing loss and tinnitus.   Eyes:  Negative for blurred vision, double vision, photophobia and pain.  Respiratory: Negative for cough and shortness of breath.   Cardiovascular: Positive for chest pain. Negative for palpitations.  Gastrointestinal: Negative for heartburn, nausea, vomiting, abdominal pain, diarrhea, constipation, blood in stool and melena.  Genitourinary: Negative for dysuria, urgency, frequency, hematuria and flank pain.  Musculoskeletal: Positive for joint pain. Negative for falls.  Neurological: Positive for headaches. Negative for dizziness and loss of consciousness.  Endo/Heme/Allergies: Negative for environmental allergies.  Psychiatric/Behavioral: Negative for depression, suicidal ideas, hallucinations and substance abuse. The patient is not nervous/anxious and does not have insomnia.     BP 148/98 mmHg  Pulse 67  Temp(Src) 97.6 F (36.4 C) (Oral)  Resp 16  Ht _0  (1.575 m)  Wt 282 lb 4 oz (128.028 kg)  BMI 51.61 kg/m2  SpO2 98%  LMP 06/23/2015 (Approximate)  Physical Exam  Constitutional: She is oriented to person, place, and time and well-developed, well-nourished, and in no distress.  HENT:  Head: Normocephalic and atraumatic.  Right Ear: External ear normal.  Left Ear: External ear normal.  Nose: Nose normal.  Mouth/Throat: Oropharynx is clear and moist. No oropharyngeal exudate.  TM within normal limits bilaterally.  Eyes: Conjunctivae are normal. Pupils are equal, round, and reactive to light.  Neck: Neck supple. No thyromegaly present.  Cardiovascular: Normal rate, regular rhythm, normal heart sounds and intact distal pulses.   Pulmonary/Chest: Effort normal and breath sounds normal. No respiratory distress. She has no wheezes. She has no rales. She exhibits no tenderness.  Abdominal: Soft. Bowel sounds are normal. She exhibits no distension. There is no tenderness.  Musculoskeletal:       Right elbow: She exhibits normal range of motion and no swelling. Tenderness found. Lateral  epicondyle tenderness noted.       Left elbow: Normal.  Neurological: She is alert and oriented to person, place, and time.  Left upper extremity is neurovascularly intact. Sensation is present but patient unable to differentiate sharp and soft sensations in first three fingers of left hand only. + Tinel and Phalen sign on exam. No muscular atrophy noted.  Skin: Skin is warm and dry. No rash noted.  Vitals reviewed.   Recent Results (from the past 2160 hour(s))  Basic metabolic panel     Status: Abnormal   Collection Time: 07/07/15 10:30 AM  Result Value Ref Range   Sodium 138 135 - 145 mmol/L   Potassium 3.6 3.5 - 5.1 mmol/L   Chloride 106 101 - 111 mmol/L   CO2 27 22 - 32 mmol/L   Glucose, Bld 76 65 - 99 mg/dL   BUN 14 6 - 20 mg/dL   Creatinine, Ser 0.81 0.44 - 1.00 mg/dL   Calcium 8.7 (L) 8.9 - 10.3 mg/dL  GFR calc non Af Amer >60 >60 mL/min   GFR calc Af Amer >60 >60 mL/min    Comment: (NOTE) The eGFR has been calculated using the CKD EPI equation. This calculation has not been validated in all clinical situations. eGFR's persistently <60 mL/min signify possible Chronic Kidney Disease.    Anion gap 5 5 - 15  CBC     Status: None   Collection Time: 07/07/15 10:30 AM  Result Value Ref Range   WBC 5.0 4.0 - 10.5 K/uL   RBC 4.35 3.87 - 5.11 MIL/uL   Hemoglobin 12.8 12.0 - 15.0 g/dL   HCT 37.9 36.0 - 46.0 %   MCV 87.1 78.0 - 100.0 fL   MCH 29.4 26.0 - 34.0 pg   MCHC 33.8 30.0 - 36.0 g/dL   RDW 13.5 11.5 - 15.5 %   Platelets 301 150 - 400 K/uL  Troponin I     Status: None   Collection Time: 07/07/15 10:30 AM  Result Value Ref Range   Troponin I <0.03 <0.031 ng/mL    Comment:        NO INDICATION OF MYOCARDIAL INJURY.   Comp Met (CMET)     Status: None   Collection Time: 07/22/15  8:47 AM  Result Value Ref Range   Sodium 136 135 - 145 mEq/L   Potassium 4.2 3.5 - 5.1 mEq/L   Chloride 106 96 - 112 mEq/L   CO2 27 19 - 32 mEq/L   Glucose, Bld 93 70 - 99 mg/dL   BUN  12 6 - 23 mg/dL   Creatinine, Ser 0.74 0.40 - 1.20 mg/dL   Total Bilirubin 0.6 0.2 - 1.2 mg/dL   Alkaline Phosphatase 46 39 - 117 U/L   AST 13 0 - 37 U/L   ALT 14 0 - 35 U/L   Total Protein 6.9 6.0 - 8.3 g/dL   Albumin 3.8 3.5 - 5.2 g/dL   Calcium 8.9 8.4 - 10.5 mg/dL   GFR 90.77 >60.00 mL/min  TSH     Status: Abnormal   Collection Time: 07/22/15  8:47 AM  Result Value Ref Range   TSH 8.42 (H) 0.35 - 4.50 uIU/mL  Hemoglobin A1c     Status: None   Collection Time: 07/22/15  8:47 AM  Result Value Ref Range   Hgb A1c MFr Bld 4.7 4.6 - 6.5 %    Comment: Glycemic Control Guidelines for People with Diabetes:Non Diabetic:  <6%Goal of Therapy: <7%Additional Action Suggested:  >8%   Lipid panel     Status: Abnormal   Collection Time: 07/22/15  8:47 AM  Result Value Ref Range   Cholesterol 130 0 - 200 mg/dL    Comment: ATP III Classification       Desirable:  < 200 mg/dL               Borderline High:  200 - 239 mg/dL          High:  > = 240 mg/dL   Triglycerides 56.0 0.0 - 149.0 mg/dL    Comment: Normal:  <150 mg/dLBorderline High:  150 - 199 mg/dL   HDL 37.20 (L) >39.00 mg/dL   VLDL 11.2 0.0 - 40.0 mg/dL   LDL Cholesterol 82 0 - 99 mg/dL   Total CHOL/HDL Ratio 3     Comment:                Men          Women1/2 Average Risk  3.4          3.3Average Risk          5.0          4.42X Average Risk          9.6          7.13X Average Risk          15.0          11.0                       NonHDL 92.73     Comment: NOTE:  Non-HDL goal should be 30 mg/dL higher than patient's LDL goal (i.e. LDL goal of < 70 mg/dL, would have non-HDL goal of < 100 mg/dL)    Assessment/Plan: Hypertension BP above goal today. Lipids and Glycemic control unknown. + family history of early CAD. Patient with episode of chest pain for which she went to ER on 5/30. No recurrence but concern for angina. Did not complete recommended workup in the ER. Will continue HCTZ. Increase lisinopril to 20 mg daily. Diet and  exercise reviewed. Will check labs today. Stress test will be ordered to assess for CAD. Alarm signs.symptoms reviewed with patient that would prompt ER assessment.  Thyroid activity decreased Will check TSH today. Will alter dose based on results. Getting this under control will help some with weight.  Carpal tunnel syndrome Wear brace at nighttime. Rx diclofenac to help with inflammation. Supportive measures reviewed. Will need referral to hand specialist if symptoms are not improving.  Lateral epicondylitis Mild. Rx Diclofenac BID. Supportive measures and OTC medications reviewed. FU if not resolving.  Morbid obesity ([place]) Body mass index is 51.61 kg/(m^2). Unfortunately medications are not an option at present until heart is further assessed and BP is under control. Thyroid function likely contributing. Will check lab panel today. Stress testing ordered. Diet and exercise reviewed with patient. Once everything is assessed -- if all is well with stress test and we get BP and thyroid under control we can discuss medications. Nutrition referral recommended but patient declines at present.  Family history of early CAD Will check lipids, A1C and order stress test. BP medications as directed.      Leeanne Rio, PA-C

## 2015-07-23 ENCOUNTER — Other Ambulatory Visit: Payer: Self-pay | Admitting: *Deleted

## 2015-07-23 DIAGNOSIS — E039 Hypothyroidism, unspecified: Secondary | ICD-10-CM

## 2015-07-23 DIAGNOSIS — Z3040 Encounter for surveillance of contraceptives, unspecified: Secondary | ICD-10-CM

## 2015-07-23 MED ORDER — LEVOTHYROXINE SODIUM 125 MCG PO TABS: 125.0000 ug | ORAL_TABLET | Freq: Every day | ORAL | Status: DC

## 2015-07-23 NOTE — Telephone Encounter (Signed)
Pt notified of results and verbalized understanding. Medication filled to pharmacy as requested. 6 week lab appt scheduled and future lab orders placed, and lab appt scheduled tomorrow for urine pregnancy only.

## 2015-07-23 NOTE — Assessment & Plan Note (Signed)
Will check lipids, A1C and order stress test. BP medications as directed.

## 2015-07-23 NOTE — Assessment & Plan Note (Signed)
Mild. Rx Diclofenac BID. Supportive measures and OTC medications reviewed. FU if not resolving.

## 2015-07-23 NOTE — Assessment & Plan Note (Signed)
Body mass index is 51.61 kg/(m^2). Unfortunately medications are not an option at present until heart is further assessed and BP is under control. Thyroid function likely contributing. Will check lab panel today. Stress testing ordered. Diet and exercise reviewed with patient. Once everything is assessed -- if all is well with stress test and we get BP and thyroid under control we can discuss medications. Nutrition referral recommended but patient declines at present.

## 2015-07-23 NOTE — Assessment & Plan Note (Signed)
Wear brace at nighttime. Rx diclofenac to help with inflammation. Supportive measures reviewed. Will need referral to hand specialist if symptoms are not improving.

## 2015-07-23 NOTE — Assessment & Plan Note (Addendum)
BP above goal today. Lipids and Glycemic control unknown. + family history of early CAD. Patient with episode of chest pain for which she went to ER on 5/30. No recurrence but concern for angina. Did not complete recommended workup in the ER. Will continue HCTZ. Increase lisinopril to 20 mg daily. Diet and exercise reviewed. Will check labs today. Stress test will be ordered to assess for CAD. Alarm signs.symptoms reviewed with patient that would prompt ER assessment.

## 2015-07-23 NOTE — Assessment & Plan Note (Signed)
Will check TSH today. Will alter dose based on results. Getting this under control will help some with weight.

## 2015-07-24 ENCOUNTER — Other Ambulatory Visit: Payer: BLUE CROSS/BLUE SHIELD

## 2015-07-24 DIAGNOSIS — Z3040 Encounter for surveillance of contraceptives, unspecified: Secondary | ICD-10-CM

## 2015-07-25 LAB — PREGNANCY, URINE: Preg Test, Ur: NEGATIVE

## 2015-07-27 MED ORDER — NORETHINDRONE 0.35 MG PO TABS: 1.0000 | ORAL_TABLET | Freq: Every day | ORAL | Status: DC

## 2015-07-27 NOTE — Addendum Note (Signed)
Addended by: Tasia Catchings on: 07/27/2015 04:12 PM   Modules accepted: Orders

## 2015-07-29 ENCOUNTER — Encounter: Payer: Self-pay | Admitting: Physician Assistant

## 2015-07-29 ENCOUNTER — Ambulatory Visit (INDEPENDENT_AMBULATORY_CARE_PROVIDER_SITE_OTHER): Payer: BLUE CROSS/BLUE SHIELD | Admitting: Physician Assistant

## 2015-07-29 VITALS — Ht 62.0 in | Wt 274.5 lb

## 2015-07-29 DIAGNOSIS — G43809 Other migraine, not intractable, without status migrainosus: Secondary | ICD-10-CM | POA: Diagnosis not present

## 2015-07-29 MED ORDER — ONDANSETRON HCL 4 MG PO TABS
4.0000 mg | ORAL_TABLET | Freq: Once | ORAL | Status: AC
  Administered 2015-07-29: 4 mg via ORAL

## 2015-07-29 MED ORDER — ONDANSETRON HCL 4 MG PO TABS: 4.0000 mg | ORAL_TABLET | Freq: Three times a day (TID) | ORAL | Status: DC | PRN

## 2015-07-29 MED ORDER — SUMATRIPTAN SUCCINATE 50 MG PO TABS: 50.0000 mg | ORAL_TABLET | Freq: Once | ORAL | Status: DC

## 2015-07-29 MED ORDER — KETOROLAC TROMETHAMINE 60 MG/2ML IM SOLN
60.0000 mg | Freq: Once | INTRAMUSCULAR | Status: AC
  Administered 2015-07-29: 60 mg via INTRAMUSCULAR

## 2015-07-29 NOTE — Progress Notes (Signed)
Pre visit review using our clinic review tool, if applicable. No additional management support is needed unless otherwise documented below in the visit note/SLS  

## 2015-07-29 NOTE — Progress Notes (Signed)
Patient presents to clinic today c/o intermittent migraine headache over the past couple of days. Endorses recurrence since this morning with photophobia, phonophobia, nausea. Endorses one episode of non-bloody emesis this morning. No recurrence. Denies vision change, AMS, fever, neck stiffness. Has not taken anything today for symptoms.  Past Medical History  Diagnosis Date  . Hypertension   . Thyroid disease   . GERD (gastroesophageal reflux disease)   . Arthritis   . Frequent headaches   . Migraines   . Panic anxiety syndrome   . Depression     Current Outpatient Prescriptions on File Prior to Visit  Medication Sig Dispense Refill  . hydrochlorothiazide (HYDRODIURIL) 25 MG tablet Take 1 tablet (25 mg total) by mouth daily. 30 tablet 1  . levothyroxine (SYNTHROID, LEVOTHROID) 125 MCG tablet Take 1 tablet (125 mcg total) by mouth daily. 90 tablet 0  . lisinopril (PRINIVIL,ZESTRIL) 20 MG tablet Take 1 tablet (20 mg total) by mouth daily. 30 tablet 1  . norethindrone (JOLIVETTE) 0.35 MG tablet Take 1 tablet (0.35 mg total) by mouth daily. 1 Package 12  . omeprazole (PRILOSEC) 20 MG capsule Take 1 capsule (20 mg total) by mouth daily. 30 capsule 1   No current facility-administered medications on file prior to visit.    Allergies  Allergen Reactions  . Adhesive [Tape] Other (See Comments)    Skin Burn.  . Sulfa Antibiotics Rash    Family History  Problem Relation Age of Onset  . Hypertension Mother     Living  . Hypertension Father     Stepfather  . Hypertension Maternal Grandmother   . Cancer Maternal Grandfather   . Hypertension Maternal Grandfather   . Hypertension Paternal Grandmother   . Hypertension Paternal Grandfather   . Arthritis Mother   . Thyroid disease Mother   . Heart disease Mother   . Heart attack Mother   . Parkinson's disease Maternal Grandmother   . Alzheimer's disease Maternal Grandmother   . Diabetes Neg Hx   . Gout Brother   . ADD / ADHD  Son     x1  . Other Son     #2-Unknown    Social History   Social History  . Marital Status: Single    Spouse Name: N/A  . Number of Children: N/A  . Years of Education: N/A   Social History Main Topics  . Smoking status: Former Research scientist (life sciences)  . Smokeless tobacco: Never Used     Comment: Quit >10 years ago  . Alcohol Use: No  . Drug Use: No  . Sexual Activity:    Partners: Female     Comment: OCP   Other Topics Concern  . None   Social History Narrative    Review of Systems - See HPI.  All other ROS are negative.  Ht '5\' 2"'  (1.575 m)  Wt 274 lb 8 oz (124.512 kg)  BMI 50.19 kg/m2  LMP 06/23/2015 (Approximate)  Physical Exam  Constitutional: She is oriented to person, place, and time and well-developed, well-nourished, and in no distress.  HENT:  Head: Normocephalic and atraumatic.  Right Ear: External ear normal.  Left Ear: External ear normal.  Nose: Nose normal.  Mouth/Throat: Oropharynx is clear and moist. No oropharyngeal exudate.  Eyes: Conjunctivae are normal. Pupils are equal, round, and reactive to light.  Neck: Normal range of motion. Neck supple.  Cardiovascular: Normal rate, regular rhythm, normal heart sounds and intact distal pulses.   Pulmonary/Chest: Effort normal and breath sounds  normal. No respiratory distress. She has no wheezes. She has no rales. She exhibits no tenderness.  Neurological: She is alert and oriented to person, place, and time.  Skin: Skin is warm and dry. No rash noted.  Psychiatric: Affect normal.  Vitals reviewed.   Recent Results (from the past 2160 hour(s))  Basic metabolic panel     Status: Abnormal   Collection Time: 07/07/15 10:30 AM  Result Value Ref Range   Sodium 138 135 - 145 mmol/L   Potassium 3.6 3.5 - 5.1 mmol/L   Chloride 106 101 - 111 mmol/L   CO2 27 22 - 32 mmol/L   Glucose, Bld 76 65 - 99 mg/dL   BUN 14 6 - 20 mg/dL   Creatinine, Ser 0.81 0.44 - 1.00 mg/dL   Calcium 8.7 (L) 8.9 - 10.3 mg/dL   GFR calc non  Af Amer >60 >60 mL/min   GFR calc Af Amer >60 >60 mL/min    Comment: (NOTE) The eGFR has been calculated using the CKD EPI equation. This calculation has not been validated in all clinical situations. eGFR's persistently <60 mL/min signify possible Chronic Kidney Disease.    Anion gap 5 5 - 15  CBC     Status: None   Collection Time: 07/07/15 10:30 AM  Result Value Ref Range   WBC 5.0 4.0 - 10.5 K/uL   RBC 4.35 3.87 - 5.11 MIL/uL   Hemoglobin 12.8 12.0 - 15.0 g/dL   HCT 37.9 36.0 - 46.0 %   MCV 87.1 78.0 - 100.0 fL   MCH 29.4 26.0 - 34.0 pg   MCHC 33.8 30.0 - 36.0 g/dL   RDW 13.5 11.5 - 15.5 %   Platelets 301 150 - 400 K/uL  Troponin I     Status: None   Collection Time: 07/07/15 10:30 AM  Result Value Ref Range   Troponin I <0.03 <0.031 ng/mL    Comment:        NO INDICATION OF MYOCARDIAL INJURY.   Comp Met (CMET)     Status: None   Collection Time: 07/22/15  8:47 AM  Result Value Ref Range   Sodium 136 135 - 145 mEq/L   Potassium 4.2 3.5 - 5.1 mEq/L   Chloride 106 96 - 112 mEq/L   CO2 27 19 - 32 mEq/L   Glucose, Bld 93 70 - 99 mg/dL   BUN 12 6 - 23 mg/dL   Creatinine, Ser 0.74 0.40 - 1.20 mg/dL   Total Bilirubin 0.6 0.2 - 1.2 mg/dL   Alkaline Phosphatase 46 39 - 117 U/L   AST 13 0 - 37 U/L   ALT 14 0 - 35 U/L   Total Protein 6.9 6.0 - 8.3 g/dL   Albumin 3.8 3.5 - 5.2 g/dL   Calcium 8.9 8.4 - 10.5 mg/dL   GFR 90.77 >60.00 mL/min  TSH     Status: Abnormal   Collection Time: 07/22/15  8:47 AM  Result Value Ref Range   TSH 8.42 (H) 0.35 - 4.50 uIU/mL  Hemoglobin A1c     Status: None   Collection Time: 07/22/15  8:47 AM  Result Value Ref Range   Hgb A1c MFr Bld 4.7 4.6 - 6.5 %    Comment: Glycemic Control Guidelines for People with Diabetes:Non Diabetic:  <6%Goal of Therapy: <7%Additional Action Suggested:  >8%   Lipid panel     Status: Abnormal   Collection Time: 07/22/15  8:47 AM  Result Value Ref Range   Cholesterol 130  0 - 200 mg/dL    Comment: ATP III  Classification       Desirable:  < 200 mg/dL               Borderline High:  200 - 239 mg/dL          High:  > = 240 mg/dL   Triglycerides 56.0 0.0 - 149.0 mg/dL    Comment: Normal:  <150 mg/dLBorderline High:  150 - 199 mg/dL   HDL 37.20 (L) >39.00 mg/dL   VLDL 11.2 0.0 - 40.0 mg/dL   LDL Cholesterol 82 0 - 99 mg/dL   Total CHOL/HDL Ratio 3     Comment:                Men          Women1/2 Average Risk     3.4          3.3Average Risk          5.0          4.42X Average Risk          9.6          7.13X Average Risk          15.0          11.0                       NonHDL 92.73     Comment: NOTE:  Non-HDL goal should be 30 mg/dL higher than patient's LDL goal (i.e. LDL goal of < 70 mg/dL, would have non-HDL goal of < 100 mg/dL)  Pregnancy, urine     Status: None   Collection Time: 07/24/15  7:07 AM  Result Value Ref Range   Preg Test, Ur NEGATIVE NEGATIVE    Comment: The sensitivity for this methodology is >24 mIU/mL.       Assessment/Plan: 1. Other migraine without status migrainosus, not intractable IM Toradol given in office. Rx Zofran. Rx Imitrex to use for abortive therapy. Supportive measures and diet reviewed. FU if recurring, as we may have to consider preventative therapy and imaging.  - ketorolac (TORADOL) injection 60 mg; Inject 2 mLs (60 mg total) into the muscle once. - ondansetron (ZOFRAN) tablet 4 mg; Take 1 tablet (4 mg total) by mouth once.   Leeanne Rio, PA-C

## 2015-07-29 NOTE — Patient Instructions (Signed)
Please stay hydrated. Caffeine will help with headaches. If you develop another migraine, please take the Imitrex as directed.  If these headaches are becoming more frequent, then we will need to consider starting a preventive medication.

## 2015-07-30 ENCOUNTER — Telehealth: Payer: Self-pay | Admitting: Physician Assistant

## 2015-07-30 NOTE — Telephone Encounter (Signed)
Work note written and filed at front desk for pick up. Pt notified and states that her head is "sore." She feels that her symptoms are improving, but it is taking some time to recover from the migraine. She is no longer experiencing "pounding HA" and photophobia like she was at appt yesterday. Advised pt to schedule follow-up appt if her migraine symptoms recur.

## 2015-07-30 NOTE — Telephone Encounter (Signed)
I am out of office today. Hoyle Sauer, would you please extend work note through Friday. Also assess patient to see if she needs repeat OV with me tomorrow if symptoms are recurring. Thank you kindly.

## 2015-07-30 NOTE — Telephone Encounter (Signed)
Caller name: Aziana Relation to pt: self  Call back number: [phone] Pharmacy:  Reason for call: Pt called stating was seen yesterday 07-29-15 and was given a work letter to return to work today 07-30-15 but pt states still not feeling well and would like to have a work note stating to return on Monday 08-03-15. Pt is needing letter for today. Please advise ASAP.

## 2015-08-03 ENCOUNTER — Telehealth (HOSPITAL_COMMUNITY): Payer: Self-pay | Admitting: *Deleted

## 2015-08-03 NOTE — Telephone Encounter (Signed)
Left message on voicemail in reference to upcoming appointment scheduled for 08/05/15 Phone number given for a call back so details instructions can be given. Hubbard Robinson, RN

## 2015-08-05 ENCOUNTER — Ambulatory Visit (HOSPITAL_COMMUNITY): Payer: BLUE CROSS/BLUE SHIELD | Attending: Cardiology

## 2015-08-05 DIAGNOSIS — I10 Essential (primary) hypertension: Secondary | ICD-10-CM | POA: Diagnosis not present

## 2015-08-05 DIAGNOSIS — R079 Chest pain, unspecified: Secondary | ICD-10-CM

## 2015-08-05 DIAGNOSIS — R9439 Abnormal result of other cardiovascular function study: Secondary | ICD-10-CM | POA: Insufficient documentation

## 2015-08-05 DIAGNOSIS — Z8249 Family history of ischemic heart disease and other diseases of the circulatory system: Secondary | ICD-10-CM | POA: Diagnosis not present

## 2015-08-05 DIAGNOSIS — R0609 Other forms of dyspnea: Secondary | ICD-10-CM | POA: Insufficient documentation

## 2015-08-05 MED ORDER — TECHNETIUM TC 99M TETROFOSMIN IV KIT
32.8000 | PACK | Freq: Once | INTRAVENOUS | Status: AC | PRN
  Administered 2015-08-05: 32.8 via INTRAVENOUS
  Filled 2015-08-05: qty 33

## 2015-08-05 MED ORDER — REGADENOSON 0.4 MG/5ML IV SOLN
0.4000 mg | Freq: Once | INTRAVENOUS | Status: AC
  Administered 2015-08-05: 0.4 mg via INTRAVENOUS

## 2015-08-06 ENCOUNTER — Ambulatory Visit (HOSPITAL_COMMUNITY): Payer: BLUE CROSS/BLUE SHIELD | Attending: Cardiology

## 2015-08-06 LAB — MYOCARDIAL PERFUSION IMAGING
CHL CUP NUCLEAR SDS: 7
CHL CUP NUCLEAR SRS: 2
CHL CUP NUCLEAR SSS: 9
CSEPPHR: 102 {beats}/min
LHR: 0.26
LV dias vol: 87 mL (ref 46–106)
LV sys vol: 25 mL
Rest HR: 73 {beats}/min
TID: 0.78

## 2015-08-06 MED ORDER — TECHNETIUM TC 99M TETROFOSMIN IV KIT
32.3000 | PACK | Freq: Once | INTRAVENOUS | Status: AC | PRN
  Administered 2015-08-06: 32.3 via INTRAVENOUS
  Filled 2015-08-06: qty 32

## 2015-08-14 ENCOUNTER — Encounter: Payer: Self-pay | Admitting: Physician Assistant

## 2015-08-14 ENCOUNTER — Ambulatory Visit (INDEPENDENT_AMBULATORY_CARE_PROVIDER_SITE_OTHER): Payer: BLUE CROSS/BLUE SHIELD | Admitting: Physician Assistant

## 2015-08-14 VITALS — BP 142/96 | HR 81 | Temp 98.4°F | Resp 16 | Ht 62.0 in | Wt 279.4 lb

## 2015-08-14 DIAGNOSIS — G5602 Carpal tunnel syndrome, left upper limb: Secondary | ICD-10-CM

## 2015-08-14 NOTE — Patient Instructions (Signed)
Please keep your phone on. You will be contacted by Hand Surgery for further assessment. Please continue wearing braces as directed.

## 2015-08-15 ENCOUNTER — Other Ambulatory Visit: Payer: Self-pay | Admitting: Physician Assistant

## 2015-08-15 NOTE — Progress Notes (Signed)
Patient presents to clinic today for follow-up of carpal tunnel syndrome of L wrist. Patient endorses taking medication and wearing brace as directed without substantial improvement. Notes some decreased grip strength. Denies numbness at present but tingling has continued.   Past Medical History  Diagnosis Date  . Hypertension   . Thyroid disease   . GERD (gastroesophageal reflux disease)   . Arthritis   . Frequent headaches   . Migraines   . Panic anxiety syndrome   . Depression     Current Outpatient Prescriptions on File Prior to Visit  Medication Sig Dispense Refill  . hydrochlorothiazide (HYDRODIURIL) 25 MG tablet Take 1 tablet (25 mg total) by mouth daily. 30 tablet 1  . levothyroxine (SYNTHROID, LEVOTHROID) 125 MCG tablet Take 1 tablet (125 mcg total) by mouth daily. 90 tablet 0  . lisinopril (PRINIVIL,ZESTRIL) 20 MG tablet Take 1 tablet (20 mg total) by mouth daily. 30 tablet 1  . norethindrone (JOLIVETTE) 0.35 MG tablet Take 1 tablet (0.35 mg total) by mouth daily. 1 Package 12  . omeprazole (PRILOSEC) 20 MG capsule Take 1 capsule (20 mg total) by mouth daily. 30 capsule 1  . ondansetron (ZOFRAN) 4 MG tablet Take 1 tablet (4 mg total) by mouth every 8 (eight) hours as needed for nausea or vomiting. 20 tablet 0  . SUMAtriptan (IMITREX) 50 MG tablet Take 1 tablet (50 mg total) by mouth once. May repeat in 2 hours if headache persists or recurs. 10 tablet 0   No current facility-administered medications on file prior to visit.    Allergies  Allergen Reactions  . Adhesive [Tape] Other (See Comments)    Skin Burn.  [name] [Menthol (Topical Analgesic)] Other (See Comments)    Skin Burn.  . Sulfa Antibiotics Rash    Family History  Problem Relation Age of Onset  . Hypertension Mother     Living  . Hypertension Father     Stepfather  . Hypertension Maternal Grandmother   . Cancer Maternal Grandfather   . Hypertension Maternal Grandfather   . Hypertension  Paternal Grandmother   . Hypertension Paternal Grandfather   . Arthritis Mother   . Thyroid disease Mother   . Heart disease Mother   . Heart attack Mother   . Parkinson's disease Maternal Grandmother   . Alzheimer's disease Maternal Grandmother   . Diabetes Neg Hx   . Gout Brother   . ADD / ADHD Son     x1  . Other Son     #2-Unknown  . Migraines Mother     Social History   Social History  . Marital Status: Single    Spouse Name: N/A  . Number of Children: N/A  . Years of Education: N/A   Social History Main Topics  . Smoking status: Former Research scientist (life sciences)  . Smokeless tobacco: Never Used     Comment: Quit >10 years ago  . Alcohol Use: No  . Drug Use: No  . Sexual Activity:    Partners: Female     Comment: OCP   Other Topics Concern  . None   Social History Narrative    Review of Systems - See HPI.  All other ROS are negative.  BP 142/96 mmHg  Pulse 81  Temp(Src) 98.4 F (36.9 C) (Oral)  Resp 16  Ht _0  (1.575 m)  Wt 279 lb 6 oz (126.724 kg)  BMI 51.09 kg/m2  SpO2 98%  LMP 07/30/2015  Physical Exam  Constitutional: She is oriented  to person, place, and time and well-developed, well-nourished, and in no distress.  HENT:  Head: Normocephalic and atraumatic.  Eyes: Conjunctivae are normal.  Cardiovascular: Normal rate, regular rhythm, normal heart sounds and intact distal pulses.   Pulmonary/Chest: Effort normal.  Musculoskeletal:       Left hand: She exhibits normal range of motion and no tenderness. Decreased sensation noted. Decreased sensation is present in the radial distribution. Normal strength noted.  Neurological: She is alert and oriented to person, place, and time. She has normal sensation and normal strength.  Vitals reviewed.   Recent Results (from the past 2160 hour(s))  Basic metabolic panel     Status: Abnormal   Collection Time: 07/07/15 10:30 AM  Result Value Ref Range   Sodium 138 135 - 145 mmol/L   Potassium 3.6 3.5 - 5.1 mmol/L    Chloride 106 101 - 111 mmol/L   CO2 27 22 - 32 mmol/L   Glucose, Bld 76 65 - 99 mg/dL   BUN 14 6 - 20 mg/dL   Creatinine, Ser 0.81 0.44 - 1.00 mg/dL   Calcium 8.7 (L) 8.9 - 10.3 mg/dL   GFR calc non Af Amer >60 >60 mL/min   GFR calc Af Amer >60 >60 mL/min    Comment: (NOTE) The eGFR has been calculated using the CKD EPI equation. This calculation has not been validated in all clinical situations. eGFR's persistently <60 mL/min signify possible Chronic Kidney Disease.    Anion gap 5 5 - 15  CBC     Status: None   Collection Time: 07/07/15 10:30 AM  Result Value Ref Range   WBC 5.0 4.0 - 10.5 K/uL   RBC 4.35 3.87 - 5.11 MIL/uL   Hemoglobin 12.8 12.0 - 15.0 g/dL   HCT 37.9 36.0 - 46.0 %   MCV 87.1 78.0 - 100.0 fL   MCH 29.4 26.0 - 34.0 pg   MCHC 33.8 30.0 - 36.0 g/dL   RDW 13.5 11.5 - 15.5 %   Platelets 301 150 - 400 K/uL  Troponin I     Status: None   Collection Time: 07/07/15 10:30 AM  Result Value Ref Range   Troponin I <0.03 <0.031 ng/mL    Comment:        NO INDICATION OF MYOCARDIAL INJURY.   Comp Met (CMET)     Status: None   Collection Time: 07/22/15  8:47 AM  Result Value Ref Range   Sodium 136 135 - 145 mEq/L   Potassium 4.2 3.5 - 5.1 mEq/L   Chloride 106 96 - 112 mEq/L   CO2 27 19 - 32 mEq/L   Glucose, Bld 93 70 - 99 mg/dL   BUN 12 6 - 23 mg/dL   Creatinine, Ser 0.74 0.40 - 1.20 mg/dL   Total Bilirubin 0.6 0.2 - 1.2 mg/dL   Alkaline Phosphatase 46 39 - 117 U/L   AST 13 0 - 37 U/L   ALT 14 0 - 35 U/L   Total Protein 6.9 6.0 - 8.3 g/dL   Albumin 3.8 3.5 - 5.2 g/dL   Calcium 8.9 8.4 - 10.5 mg/dL   GFR 90.77 >60.00 mL/min  TSH     Status: Abnormal   Collection Time: 07/22/15  8:47 AM  Result Value Ref Range   TSH 8.42 (H) 0.35 - 4.50 uIU/mL  Hemoglobin A1c     Status: None   Collection Time: 07/22/15  8:47 AM  Result Value Ref Range   Hgb A1c MFr Bld 4.7  4.6 - 6.5 %    Comment: Glycemic Control Guidelines for People with Diabetes:Non Diabetic:  <6%Goal  of Therapy: <7%Additional Action Suggested:  >8%   Lipid panel     Status: Abnormal   Collection Time: 07/22/15  8:47 AM  Result Value Ref Range   Cholesterol 130 0 - 200 mg/dL    Comment: ATP III Classification       Desirable:  < 200 mg/dL               Borderline High:  200 - 239 mg/dL          High:  > = 240 mg/dL   Triglycerides 56.0 0.0 - 149.0 mg/dL    Comment: Normal:  <150 mg/dLBorderline High:  150 - 199 mg/dL   HDL 37.20 (L) >39.00 mg/dL   VLDL 11.2 0.0 - 40.0 mg/dL   LDL Cholesterol 82 0 - 99 mg/dL   Total CHOL/HDL Ratio 3     Comment:                Men          Women1/2 Average Risk     3.4          3.3Average Risk          5.0          4.42X Average Risk          9.6          7.13X Average Risk          15.0          11.0                       NonHDL 92.73     Comment: NOTE:  Non-HDL goal should be 30 mg/dL higher than patient's LDL goal (i.e. LDL goal of < 70 mg/dL, would have non-HDL goal of < 100 mg/dL)  Pregnancy, urine     Status: None   Collection Time: 07/24/15  7:07 AM  Result Value Ref Range   Preg Test, Ur NEGATIVE NEGATIVE    Comment: The sensitivity for this methodology is >24 mIU/mL.     Myocardial Perfusion Imaging     Status: None   Collection Time: 08/06/15 12:49 PM  Result Value Ref Range   Rest HR 73 bpm   Rest BP 95/72 mmHg   Exercise duration (min)  min   Exercise duration (sec)  sec   Estimated workload  METS   Peak HR 102 bpm   Peak BP 123/79 mmHg   MPHR  bpm   Percent HR  %   RPE     LV sys vol 25 mL   TID 0.78    LV dias vol 87 46 - 106 mL   LHR 0.26    SSS 9    SRS 2    SDS 7     Assessment/Plan: 1. Carpal tunnel syndrome, left Continue bracing and OTC medications. Referral placed to Hand Surgery for further management.  - Ambulatory referral to Hand Surgery   Leeanne Rio, PA-C

## 2015-08-20 ENCOUNTER — Encounter: Payer: Self-pay | Admitting: Physician Assistant

## 2015-08-20 ENCOUNTER — Other Ambulatory Visit: Payer: Self-pay | Admitting: Physician Assistant

## 2015-08-21 MED ORDER — ONDANSETRON HCL 4 MG PO TABS: 4.0000 mg | ORAL_TABLET | Freq: Three times a day (TID) | ORAL | Status: DC | PRN

## 2015-08-21 MED ORDER — LEVOTHYROXINE SODIUM 125 MCG PO TABS: 125.0000 ug | ORAL_TABLET | Freq: Every day | ORAL | Status: DC

## 2015-08-21 MED ORDER — SUMATRIPTAN SUCCINATE 50 MG PO TABS: 50.0000 mg | ORAL_TABLET | Freq: Once | ORAL | Status: DC

## 2015-09-04 ENCOUNTER — Other Ambulatory Visit (INDEPENDENT_AMBULATORY_CARE_PROVIDER_SITE_OTHER): Payer: BLUE CROSS/BLUE SHIELD

## 2015-09-04 DIAGNOSIS — E039 Hypothyroidism, unspecified: Secondary | ICD-10-CM

## 2015-09-04 LAB — TSH: TSH: 5.75 u[IU]/mL — AB (ref 0.35–4.50)

## 2015-09-07 ENCOUNTER — Telehealth: Payer: Self-pay | Admitting: *Deleted

## 2015-09-07 MED ORDER — LEVOTHYROXINE SODIUM 150 MCG PO TABS: 150.0000 ug | ORAL_TABLET | Freq: Every day | ORAL | 3 refills | Status: DC

## 2015-09-07 NOTE — Telephone Encounter (Signed)
Patient informed, understood & agreed; new Rx to pharmacy/SLS 07/31

## 2015-09-07 NOTE — Telephone Encounter (Signed)
-----   Message from Brunetta Jeans, PA-C sent at 09/06/2015  8:08 PM EDT ----- TSH improved but still above goal. Need to increase levothyroxine to 150 mcg daily. Ok to send in 30-day supply with 3 refills. Repeat TSH again in 6 weeks.

## 2015-09-28 ENCOUNTER — Other Ambulatory Visit: Payer: Self-pay | Admitting: Physician Assistant

## 2015-09-28 DIAGNOSIS — G5602 Carpal tunnel syndrome, left upper limb: Secondary | ICD-10-CM

## 2015-09-28 DIAGNOSIS — M7711 Lateral epicondylitis, right elbow: Secondary | ICD-10-CM

## 2015-09-28 NOTE — Telephone Encounter (Signed)
Rx request Denied; pt needs to contact office per provider; was D/C as Ineffective in July 2017/SLS 08/21

## 2015-10-16 ENCOUNTER — Encounter: Payer: Self-pay | Admitting: Physician Assistant

## 2015-10-16 ENCOUNTER — Ambulatory Visit (INDEPENDENT_AMBULATORY_CARE_PROVIDER_SITE_OTHER): Payer: BLUE CROSS/BLUE SHIELD | Admitting: Physician Assistant

## 2015-10-16 VITALS — BP 128/84 | HR 74 | Temp 98.2°F | Resp 16 | Ht 62.0 in | Wt 280.5 lb

## 2015-10-16 DIAGNOSIS — E039 Hypothyroidism, unspecified: Secondary | ICD-10-CM | POA: Diagnosis not present

## 2015-10-16 LAB — TSH: TSH: 0.99 u[IU]/mL (ref 0.35–4.50)

## 2015-10-16 MED ORDER — CITALOPRAM HYDROBROMIDE 10 MG PO TABS: 10.0000 mg | ORAL_TABLET | Freq: Every day | ORAL | 1 refills | Status: DC

## 2015-10-16 MED ORDER — PHENTERMINE HCL 15 MG PO CAPS: 15.0000 mg | ORAL_CAPSULE | ORAL | 1 refills | Status: DC

## 2015-10-16 NOTE — Progress Notes (Signed)
Patient presents to clinic today to discuss options for weight loss. Body mass index is 51.3 kg/m. Is trying to work on exercise but this is somewhat limited at present due to bilateral significant carpal tunnel syndrome, for which she is seeing a specialist. Is trying to cook at home more often. Does eat fast food a few times per week.  Patient also due for repeat TSH check to assess if current levothyroxine dose has been sufficient to treat her hypothyroidism.  Past Medical History:  Diagnosis Date  . Arthritis   . Depression   . Frequent headaches   . GERD (gastroesophageal reflux disease)   . Hypertension   . Migraines   . Panic anxiety syndrome   . Thyroid disease     Current Outpatient Prescriptions on File Prior to Visit  Medication Sig Dispense Refill  . hydrochlorothiazide (HYDRODIURIL) 25 MG tablet Take 1 tablet (25 mg total) by mouth daily. 30 tablet 1  . levothyroxine (SYNTHROID, LEVOTHROID) 150 MCG tablet Take 1 tablet (150 mcg total) by mouth daily. 30 tablet 3  . lisinopril (PRINIVIL,ZESTRIL) 20 MG tablet Take 1 tablet (20 mg total) by mouth daily. 30 tablet 1  . norethindrone (JOLIVETTE) 0.35 MG tablet Take 1 tablet (0.35 mg total) by mouth daily. 1 Package 12  . omeprazole (PRILOSEC) 20 MG capsule Take 1 capsule (20 mg total) by mouth daily. 30 capsule 1  . ondansetron (ZOFRAN) 4 MG tablet Take 1 tablet (4 mg total) by mouth every 8 (eight) hours as needed for nausea or vomiting. 20 tablet 0  . SUMAtriptan (IMITREX) 50 MG tablet Take 1 tablet (50 mg total) by mouth once. May repeat in 2 hours if headache persists or recurs. 10 tablet 0   No current facility-administered medications on file prior to visit.     Allergies  Allergen Reactions  . Adhesive [Tape] Other (See Comments)    Skin Burn.  [name] [Menthol (Topical Analgesic)] Other (See Comments)    Skin Burn.  . Sulfa Antibiotics Rash    Family History  Problem Relation Age of Onset  .  Hypertension Mother     Living  . Arthritis Mother   . Thyroid disease Mother   . Heart disease Mother   . Heart attack Mother   . Migraines Mother   . Hypertension Father     Stepfather  . Hypertension Maternal Grandmother   . Parkinson's disease Maternal Grandmother   . Alzheimer's disease Maternal Grandmother   . Cancer Maternal Grandfather   . Hypertension Maternal Grandfather   . Hypertension Paternal Grandmother   . Hypertension Paternal Grandfather   . Gout Brother   . ADD / ADHD Son     x1  . Other Son     #2-Unknown  . Diabetes Neg Hx     Social History   Social History  . Marital status: Single    Spouse name: N/A  . Number of children: N/A  . Years of education: N/A   Social History Main Topics  . Smoking status: Former Research scientist (life sciences)  . Smokeless tobacco: Never Used     Comment: Quit >10 years ago  . Alcohol use No  . Drug use: No  . Sexual activity: Yes    Partners: Female     Comment: OCP   Other Topics Concern  . None   Social History Narrative  . None    Review of Systems - See HPI.  All other ROS are negative.  BP 128/84 (  BP Location: Left Arm, Patient Position: Sitting, Cuff Size: Large)   Pulse 74   Temp 98.2 F (36.8 C) (Oral)   Resp 16   Ht _0  (1.575 m)   Wt 280 lb 8 oz (127.2 kg)   LMP 09/24/2015   SpO2 98%   BMI 51.30 kg/m   Physical Exam  Constitutional: She is oriented to person, place, and time and well-developed, well-nourished, and in no distress.  HENT:  Head: Normocephalic and atraumatic.  Eyes: Conjunctivae are normal.  Neck: Neck supple.  Cardiovascular: Normal rate, regular rhythm, normal heart sounds and intact distal pulses.   Pulmonary/Chest: Breath sounds normal. No respiratory distress. She has no wheezes. She has no rales. She exhibits no tenderness.  Neurological: She is alert and oriented to person, place, and time.  Skin: Skin is warm and dry. No rash noted.  Psychiatric: Affect normal.  Vitals  reviewed.   Recent Results (from the past 2160 hour(s))  Comp Met (CMET)     Status: None   Collection Time: 07/22/15  8:47 AM  Result Value Ref Range   Sodium 136 135 - 145 mEq/L   Potassium 4.2 3.5 - 5.1 mEq/L   Chloride 106 96 - 112 mEq/L   CO2 27 19 - 32 mEq/L   Glucose, Bld 93 70 - 99 mg/dL   BUN 12 6 - 23 mg/dL   Creatinine, Ser 0.74 0.40 - 1.20 mg/dL   Total Bilirubin 0.6 0.2 - 1.2 mg/dL   Alkaline Phosphatase 46 39 - 117 U/L   AST 13 0 - 37 U/L   ALT 14 0 - 35 U/L   Total Protein 6.9 6.0 - 8.3 g/dL   Albumin 3.8 3.5 - 5.2 g/dL   Calcium 8.9 8.4 - 10.5 mg/dL   GFR 90.77 >60.00 mL/min  TSH     Status: Abnormal   Collection Time: 07/22/15  8:47 AM  Result Value Ref Range   TSH 8.42 (H) 0.35 - 4.50 uIU/mL  Hemoglobin A1c     Status: None   Collection Time: 07/22/15  8:47 AM  Result Value Ref Range   Hgb A1c MFr Bld 4.7 4.6 - 6.5 %    Comment: Glycemic Control Guidelines for People with Diabetes:Non Diabetic:  <6%Goal of Therapy: <7%Additional Action Suggested:  >8%   Lipid panel     Status: Abnormal   Collection Time: 07/22/15  8:47 AM  Result Value Ref Range   Cholesterol 130 0 - 200 mg/dL    Comment: ATP III Classification       Desirable:  < 200 mg/dL               Borderline High:  200 - 239 mg/dL          High:  > = 240 mg/dL   Triglycerides 56.0 0.0 - 149.0 mg/dL    Comment: Normal:  <150 mg/dLBorderline High:  150 - 199 mg/dL   HDL 37.20 (L) >39.00 mg/dL   VLDL 11.2 0.0 - 40.0 mg/dL   LDL Cholesterol 82 0 - 99 mg/dL   Total CHOL/HDL Ratio 3     Comment:                Men          Women1/2 Average Risk     3.4          3.3Average Risk          5.0  4.42X Average Risk          9.6          7.13X Average Risk          15.0          11.0                       NonHDL 92.73     Comment: NOTE:  Non-HDL goal should be 30 mg/dL higher than patient's LDL goal (i.e. LDL goal of < 70 mg/dL, would have non-HDL goal of < 100 mg/dL)  Pregnancy, urine     Status: None    Collection Time: 07/24/15  7:07 AM  Result Value Ref Range   Preg Test, Ur NEGATIVE NEGATIVE    Comment: The sensitivity for this methodology is >24 mIU/mL.     Myocardial Perfusion Imaging     Status: None   Collection Time: 08/06/15 12:49 PM  Result Value Ref Range   Rest HR 73 bpm   Rest BP 95/72 mmHg   Exercise duration (min)  min   Exercise duration (sec)  sec   Estimated workload  METS   Peak HR 102 bpm   Peak BP 123/79 mmHg   MPHR  bpm   Percent HR  %   RPE     LV sys vol 25 mL   TID 0.78    LV dias vol 87 46 - 106 mL   LHR 0.26    SSS 9    SRS 2    SDS 7   TSH     Status: Abnormal   Collection Time: 09/04/15  7:04 AM  Result Value Ref Range   TSH 5.75 (H) 0.35 - 4.50 uIU/mL    Assessment/Plan: 1. Hypothyroidism, unspecified hypothyroidism type Is taking medication as directed. Will repeat TSH today. - TSH  2. Morbid obesity, unspecified obesity type [facility]) Exercise limited but discussed recommendations. Dietary recommendations reviewed. She needs to cut out fast food and watch portion sizes. Referral placed to nutrition. Rx phentermine 15 mg daily. FU scheduled. She will give thought to Bariatric consult.  - phentermine 15 MG capsule; Take 1 capsule (15 mg total) by mouth every morning.  Dispense: 30 capsule; Refill: 1   Leeanne Rio, [place]

## 2015-10-16 NOTE — Patient Instructions (Signed)
Please go to the lab for blood work. I will call you with your results.  Please start the phentermine as directed. You will be contacted for an assessment by Nutrition. Please keep up with diet and exercise.  Avoid fast foods.  Please start the Citalopram as directed.  Follow-up with me in 1 month.

## 2015-11-12 ENCOUNTER — Encounter: Payer: BLUE CROSS/BLUE SHIELD | Attending: Physician Assistant | Admitting: Dietician

## 2015-11-12 ENCOUNTER — Encounter: Payer: Self-pay | Admitting: Dietician

## 2015-11-12 NOTE — Progress Notes (Signed)
  Medical Nutrition Therapy:  Appt start time: 0935 end time:  1020.  Patient was early.   Assessment:  Primary concerns today: Patient is here alone.  She would like to lose weight.  She is currently seeing Dr. Hassell Done and considering options for weight loss surgery. She requested RD appointment.  Other hx includes HTN, GERD, and hypothyroidism.  She has been prescribed phentamine for weight loss but has not started this yet due to arm surgery yesterday.  She plans on starting this soon.  Labs 07/22/15 include:  HDL 37, A1C of 4.7%.  She only sleeps around 3 hours per day due to pain.  Patient lives with her boyfriend.  Both share shopping and cooking.  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  She is also going to school full time (online) studying criminal justice and substance abuse through [employer].  Hobbies include remote control cars and this has increased her walking.  Weight hx: Lowest adult weight Unknown Highest weight 290 lbs 05/2015 Today 282 lbs  Patient lives with her boyfriend.  Both shops and he cooks.  They use low fat cooking methods most often.  He is wanting to lose weight as well and has been motivational to her.  Preferred Learning Style:   No preference indicated   Learning Readiness:   Ready  Change in progress   MEDICATIONS: see list to include phentamine which she will start soon.   DIETARY INTAKE:  Usual eating pattern includes 2-3 meals and 2 snacks per day.  Avoided foods include soda.    24-hr recall:  B ( AM):  Sweetened oatmeal OR SKIPS most often "not a morning person". Snk ( AM): banana  L ( PM): sandwich or fast food Snk ( PM): granola bar D ( PM): SKIPS or peanut butter and banana or PB&J sandwich OR granola bar (based on how tired she is or how hot) Snk ( PM): none Beverages: water, half and half sweet tea,   Usual physical activity: she and her boyfriend joined the gym.  They go 3-4 times per week for one  hour.  She also walks the dogs and walks with remote control cars.  Estimated energy needs: 1600 calories 120 g protein  Progress Towards Goal(s):  In progress.   Nutritional Diagnosis:  NB-1.1 Food and nutrition-related knowledge deficit As related to weight loss.  As evidenced by patient report.    Intervention:  Nutrition counseling/education related to weight loss.  Discussed benefits of continued physical activity.  Effects of eating on metabolism and importance of not skipping meals.  Discussed increasing non starchy vegetables and healthier choices when eating out.  Teaching Method Utilized:  Visual Auditory Hands on  Handouts given during visit include:  Weight loss tips  My plate  Snack list  Barriers to learning/adherence to lifestyle change: none  Demonstrated degree of understanding via:  Teach Back   Monitoring/Evaluation:  Dietary intake, exercise, and body weight in 1 month(s).

## 2015-11-12 NOTE — Patient Instructions (Addendum)
Continue to stay as active as possible.  Aim for 30-60 minutes most days. Eat slowly.  Stop when you are full. Continue mindful food choices. Avoid skipping meals. Before a snack ask, "Am I hungry?".  If you are not hungry walk or do something else that you enjoy. Each meal needs a small amount of protein and carbohydrate. Half of the plate should be non starchy vegetables. A Quarter of the plate should be protein (meat, egg, cheese, beans, etc.) Aim for 2 carbohydrates choices per choices per meal (30 grams).

## 2015-11-17 ENCOUNTER — Ambulatory Visit: Payer: BLUE CROSS/BLUE SHIELD | Admitting: Physician Assistant

## 2015-12-14 ENCOUNTER — Ambulatory Visit: Payer: BLUE CROSS/BLUE SHIELD | Admitting: Dietician

## 2016-01-24 ENCOUNTER — Other Ambulatory Visit: Payer: Self-pay | Admitting: Physician Assistant

## 2016-01-26 ENCOUNTER — Encounter: Payer: Self-pay | Admitting: General Practice

## 2016-01-26 NOTE — Telephone Encounter (Signed)
Medication filled to pharmacy as requested. #30 with 0, mychart message sent to pt to schedule a Thyroid follow up this month with [facility].

## 2016-02-02 ENCOUNTER — Telehealth: Payer: Self-pay | Admitting: Physician Assistant

## 2016-02-02 NOTE — Telephone Encounter (Signed)
Patient would like to transfer care from [facility] to [facility].

## 2016-02-03 NOTE — Telephone Encounter (Signed)
Ok with me 

## 2016-02-04 NOTE — Telephone Encounter (Signed)
Ok with me 

## 2016-02-08 DIAGNOSIS — Z86718 Personal history of other venous thrombosis and embolism: Secondary | ICD-10-CM | POA: Insufficient documentation

## 2016-02-08 HISTORY — DX: Personal history of other venous thrombosis and embolism: Z86.718

## 2016-02-22 ENCOUNTER — Telehealth: Payer: Self-pay

## 2016-02-22 NOTE — Telephone Encounter (Signed)
Pre visit call made to patient. Patient at work right now. Will call before 5 or will try to come in early per patient.

## 2016-02-23 ENCOUNTER — Encounter: Payer: Self-pay | Admitting: Family

## 2016-02-23 ENCOUNTER — Ambulatory Visit (INDEPENDENT_AMBULATORY_CARE_PROVIDER_SITE_OTHER): Payer: Commercial Managed Care - PPO | Admitting: Family

## 2016-02-23 DIAGNOSIS — R5383 Other fatigue: Secondary | ICD-10-CM | POA: Diagnosis not present

## 2016-02-23 DIAGNOSIS — E039 Hypothyroidism, unspecified: Secondary | ICD-10-CM | POA: Diagnosis not present

## 2016-02-23 DIAGNOSIS — G4700 Insomnia, unspecified: Secondary | ICD-10-CM | POA: Insufficient documentation

## 2016-02-23 DIAGNOSIS — R400 Somnolence: Secondary | ICD-10-CM

## 2016-02-23 DIAGNOSIS — K219 Gastro-esophageal reflux disease without esophagitis: Secondary | ICD-10-CM | POA: Insufficient documentation

## 2016-02-23 DIAGNOSIS — F339 Major depressive disorder, recurrent, unspecified: Secondary | ICD-10-CM

## 2016-02-23 DIAGNOSIS — G43909 Migraine, unspecified, not intractable, without status migrainosus: Secondary | ICD-10-CM | POA: Insufficient documentation

## 2016-02-23 DIAGNOSIS — G43809 Other migraine, not intractable, without status migrainosus: Secondary | ICD-10-CM

## 2016-02-23 DIAGNOSIS — F32A Depression, unspecified: Secondary | ICD-10-CM | POA: Insufficient documentation

## 2016-02-23 DIAGNOSIS — F329 Major depressive disorder, single episode, unspecified: Secondary | ICD-10-CM | POA: Insufficient documentation

## 2016-02-23 DIAGNOSIS — I10 Essential (primary) hypertension: Secondary | ICD-10-CM

## 2016-02-23 DIAGNOSIS — Z0000 Encounter for general adult medical examination without abnormal findings: Secondary | ICD-10-CM

## 2016-02-23 LAB — COMPREHENSIVE METABOLIC PANEL
ALT: 17 U/L (ref 0–35)
AST: 18 U/L (ref 0–37)
Albumin: 3.7 g/dL (ref 3.5–5.2)
Alkaline Phosphatase: 50 U/L (ref 39–117)
BUN: 14 mg/dL (ref 6–23)
CO2: 27 meq/L (ref 19–32)
Calcium: 9 mg/dL (ref 8.4–10.5)
Chloride: 104 mEq/L (ref 96–112)
Creatinine, Ser: 0.68 mg/dL (ref 0.40–1.20)
GFR: 99.8 mL/min (ref >60.00)
GLUCOSE: 84 mg/dL (ref 70–99)
POTASSIUM: 3.7 meq/L (ref 3.5–5.1)
Sodium: 136 mEq/L (ref 135–145)
Total Bilirubin: 0.6 mg/dL (ref 0.2–1.2)
Total Protein: 7.1 g/dL (ref 6.0–8.3)

## 2016-02-23 LAB — CBC WITH DIFFERENTIAL/PLATELET
BASOS PCT: 0.3 % (ref 0.0–3.0)
Basophils Absolute: 0 10*3/uL (ref 0.0–0.1)
EOS PCT: 2.8 % (ref 0.0–5.0)
Eosinophils Absolute: 0.2 10*3/uL (ref 0.0–0.7)
HEMATOCRIT: 35.9 % — AB (ref 36.0–46.0)
Hemoglobin: 12.4 g/dL (ref 12.0–15.0)
LYMPHS PCT: 20.3 % (ref 12.0–46.0)
Lymphs Abs: 1.3 10*3/uL (ref 0.7–4.0)
MCHC: 34.4 g/dL (ref 30.0–36.0)
MCV: 83.3 fl (ref 78.0–100.0)
Monocytes Absolute: 0.5 10*3/uL (ref 0.1–1.0)
Monocytes Relative: 7.4 % (ref 3.0–12.0)
NEUTROS ABS: 4.5 10*3/uL (ref 1.4–7.7)
Neutrophils Relative %: 69.2 % (ref 43.0–77.0)
PLATELETS: 325 10*3/uL (ref 150.0–400.0)
RBC: 4.32 Mil/uL (ref 3.87–5.11)
RDW: 13.7 % (ref 11.5–15.5)
WBC: 6.6 10*3/uL (ref 4.0–10.5)

## 2016-02-23 LAB — TSH: TSH: 5.19 u[IU]/mL — ABNORMAL HIGH (ref 0.35–4.50)

## 2016-02-23 MED ORDER — AMLODIPINE BESYLATE 5 MG PO TABS: 5.0000 mg | ORAL_TABLET | Freq: Every day | ORAL | 3 refills | Status: DC

## 2016-02-23 MED ORDER — VENLAFAXINE HCL ER 37.5 MG PO CP24: ORAL_CAPSULE | ORAL | 1 refills | Status: DC

## 2016-02-23 MED ORDER — PANTOPRAZOLE SODIUM 40 MG PO TBEC
40.0000 mg | DELAYED_RELEASE_TABLET | Freq: Every day | ORAL | 3 refills | Status: DC
Start: 2016-02-23 — End: 2016-06-14

## 2016-02-23 NOTE — Patient Instructions (Addendum)
Stop phentermine. Decrease citalopram 10mg  to 1/2 tab daily for 1 week, then every other day for 1 week then stop. Begin effexor 1 tab once daily for 3 days, then increase to 2 tabs once daily on day 4.   You will be contacted about your referral to the medical weight loss clinic and about your referral for a home sleep study.   Add amlodipine for your blood pressure.

## 2016-02-23 NOTE — Assessment & Plan Note (Signed)
Will refer to the medical weight loss clinic for further management.

## 2016-02-23 NOTE — Progress Notes (Signed)
Subjective:    Patient ID: Sharon Rivers, female    DOB: October 13, 1970, 46 y.o.   MRN: [id number]  HPI  Ms.  Rivers is a 46 yr old female who presents today to establish care in transfer from another provider at our practice.    1) hypothyroid- reports good compliance with her synthroid. Reports feeling very tired.   Reports that she is "always sleepy."  Reports that her sleep is non-restorative.  Has trouble  staying asleep. She thinks she snores sometimes.   Lab Results  Component Value Date   TSH 0.99 10/16/2015   2) Obesity- reports that she does not feel that the phentermine is not helping to suppress her appetite.    Wt Readings from Last 3 Encounters:  02/23/16 295 lb 6.4 oz (134 kg)  11/12/15 282 lb (127.9 kg)  10/16/15 280 lb 8 oz (127.2 kg)   3)  GERD- patient has been maintained on omeprazole 20mg  once daily.  Notes recent deterioration in her gerd symptoms.   4) Insomnia-  Pt works 3:30 PM until midnight.  Having difficulty sleeping through the night.    5) HTN- reports good compliance with hctz and lisinopril.  BP Readings from Last 3 Encounters:  02/23/16 (!) 152/105  10/16/15 128/84  08/14/15 (!) 142/96     6) migraines- reports that she is having daily migraines.    7) Depression- notes that in the last 3 weeks her anxiety has worsened. Reports that she has been grinding her teeth, cries "at the drop of a hat."    Review of Systems See HPI  Past Medical History:  Diagnosis Date  . Arthritis   . Depression   . Frequent headaches   . GERD (gastroesophageal reflux disease)   . Hypertension   . Migraines   . Panic anxiety syndrome   . Thyroid disease      Social History   Social History  . Marital status: Single    Spouse name: N/A  . Number of children: N/A  . Years of education: N/A   Occupational History  . Not on file.   Social History Main Topics  . Smoking status: Former Research scientist (life sciences)  . Smokeless tobacco: Never Used     Comment: Quit >10  years ago  . Alcohol use No  . Drug use: No  . Sexual activity: Yes    Partners: Female     Comment: OCP   Other Topics Concern  . Not on file   Social History Narrative  . No narrative on file    Past Surgical History:  Procedure Laterality Date  . CESAREAN SECTION  2001 & 2002  . WISDOM TOOTH EXTRACTION      Family History  Problem Relation Age of Onset  . Hypertension Mother     Living  . Arthritis Mother   . Thyroid disease Mother   . Heart disease Mother   . Heart attack Mother   . Migraines Mother   . Hypertension Father     Stepfather  . Hypertension Maternal Grandmother   . Parkinson's disease Maternal Grandmother   . Alzheimer's disease Maternal Grandmother   . Cancer Maternal Grandfather   . Hypertension Maternal Grandfather   . Hypertension Paternal Grandmother   . Hypertension Paternal Grandfather   . Gout Brother   . ADD / ADHD Son     x1  . Other Son     #2-Unknown  . Diabetes Neg Hx     Allergies  Allergen  Reactions  . Adhesive [Tape] Other (See Comments)    Skin Burn.  [name] [Menthol (Topical Analgesic)] Other (See Comments)    Skin Burn.  . Sulfa Antibiotics Rash    Current Outpatient Prescriptions on File Prior to Visit  Medication Sig Dispense Refill  . citalopram (CELEXA) 10 MG tablet Take 1 tablet (10 mg total) by mouth daily. 30 tablet 1  . hydrochlorothiazide (HYDRODIURIL) 25 MG tablet Take 1 tablet (25 mg total) by mouth daily. 30 tablet 1  . HYDROcodone-acetaminophen (NORCO) 10-325 MG tablet Take 1 tablet by mouth every 6 (six) hours as needed.    [name] levothyroxine (SYNTHROID, LEVOTHROID) 150 MCG tablet TAKE 1 TABLET BY MOUTH DAILY 30 tablet 0  . lisinopril (PRINIVIL,ZESTRIL) 20 MG tablet Take 1 tablet (20 mg total) by mouth daily. 30 tablet 1  . norethindrone (JOLIVETTE) 0.35 MG tablet Take 1 tablet (0.35 mg total) by mouth daily. 1 Package 12  . ondansetron (ZOFRAN) 4 MG tablet Take 1 tablet (4 mg total) by mouth every 8  (eight) hours as needed for nausea or vomiting. 20 tablet 0  . SUMAtriptan (IMITREX) 50 MG tablet Take 1 tablet (50 mg total) by mouth once. May repeat in 2 hours if headache persists or recurs. 10 tablet 0   No current facility-administered medications on file prior to visit.     BP (!) 150/96 (BP Location: Right Arm) Comment (Cuff Size): thigh cuff  Pulse 75   Temp 98 F (36.7 C) (Oral)   Resp 18   Ht 5\' 2"  (1.575 m)   Wt 295 lb 6.4 oz (134 kg)   LMP 02/02/2016   SpO2 98% Comment: ROOM AIR  BMI 54.03 kg/m       Objective:   Physical Exam  Constitutional: She is oriented to person, place, and time. She appears well-developed and well-nourished.  HENT:  Head: Normocephalic and atraumatic.  Eyes: Conjunctivae are normal.  Cardiovascular: Normal rate, regular rhythm and normal heart sounds.   No murmur heard. Pulmonary/Chest: Effort normal and breath sounds normal. No respiratory distress. She has no wheezes.  Musculoskeletal: She exhibits no edema.  Lymphadenopathy:    She has no cervical adenopathy.  Neurological: She is alert and oriented to person, place, and time.  Psychiatric: She has a normal mood and affect. Her behavior is normal. Judgment and thought content normal.          Assessment & Plan:

## 2016-02-23 NOTE — Assessment & Plan Note (Signed)
Uncontrolled. Advised pt to d/c omeprazole and begin protonix 40mg . Discussed importance of weight loss and gerd diet.

## 2016-02-23 NOTE — Assessment & Plan Note (Signed)
Uncontrolled, add amlodipine 5mg .

## 2016-02-23 NOTE — Assessment & Plan Note (Signed)
Uncontrolled. She has gained 15 pounds since starting citalopram. Will wean off of citalopram and begin trial of effexor as outlined in AVS.

## 2016-02-23 NOTE — Assessment & Plan Note (Signed)
I would like to rule out OSA as a contributing factor.  Obtain sleep study.

## 2016-02-23 NOTE — Progress Notes (Signed)
Pre visit review using our clinic review tool, if applicable. No additional management support is needed unless otherwise documented below in the visit note. 

## 2016-02-23 NOTE — Assessment & Plan Note (Signed)
Uncontrolled.  ? If secondary to undiagnosed  OSA.

## 2016-02-23 NOTE — Assessment & Plan Note (Signed)
Due to fatigue, will check follow up TSH.  I would also recommend referral for a home sleep study to test her for OSA.  Due to habitus and complaints, I have a high suspicion for OSA.

## 2016-02-24 ENCOUNTER — Other Ambulatory Visit: Payer: Self-pay | Admitting: Physician Assistant

## 2016-02-25 ENCOUNTER — Other Ambulatory Visit: Payer: Self-pay | Admitting: Family

## 2016-02-25 ENCOUNTER — Encounter: Payer: Self-pay | Admitting: Family

## 2016-02-25 DIAGNOSIS — E039 Hypothyroidism, unspecified: Secondary | ICD-10-CM

## 2016-02-25 MED ORDER — LEVOTHYROXINE SODIUM 175 MCG PO TABS: 175.0000 ug | ORAL_TABLET | Freq: Every day | ORAL | 1 refills | Status: DC

## 2016-02-27 ENCOUNTER — Encounter (INDEPENDENT_AMBULATORY_CARE_PROVIDER_SITE_OTHER): Payer: Self-pay | Admitting: Family Medicine

## 2016-03-03 ENCOUNTER — Encounter: Payer: Self-pay | Admitting: Family

## 2016-03-10 ENCOUNTER — Ambulatory Visit (INDEPENDENT_AMBULATORY_CARE_PROVIDER_SITE_OTHER): Payer: Commercial Managed Care - PPO | Admitting: Family Medicine

## 2016-03-10 ENCOUNTER — Encounter (INDEPENDENT_AMBULATORY_CARE_PROVIDER_SITE_OTHER): Payer: Self-pay | Admitting: Family Medicine

## 2016-03-10 ENCOUNTER — Ambulatory Visit (HOSPITAL_BASED_OUTPATIENT_CLINIC_OR_DEPARTMENT_OTHER): Payer: Commercial Managed Care - PPO

## 2016-03-10 VITALS — BP 113/76 | HR 72 | Temp 98.5°F | Resp 12 | Ht 62.0 in | Wt 280.0 lb

## 2016-03-10 DIAGNOSIS — R5383 Other fatigue: Secondary | ICD-10-CM

## 2016-03-10 DIAGNOSIS — Z1331 Encounter for screening for depression: Secondary | ICD-10-CM

## 2016-03-10 DIAGNOSIS — I10 Essential (primary) hypertension: Secondary | ICD-10-CM | POA: Diagnosis not present

## 2016-03-10 DIAGNOSIS — E039 Hypothyroidism, unspecified: Secondary | ICD-10-CM

## 2016-03-10 DIAGNOSIS — R0602 Shortness of breath: Secondary | ICD-10-CM | POA: Diagnosis not present

## 2016-03-10 DIAGNOSIS — Z1389 Encounter for screening for other disorder: Secondary | ICD-10-CM

## 2016-03-10 DIAGNOSIS — Z9189 Other specified personal risk factors, not elsewhere classified: Secondary | ICD-10-CM | POA: Diagnosis not present

## 2016-03-10 DIAGNOSIS — Z0289 Encounter for other administrative examinations: Secondary | ICD-10-CM

## 2016-03-10 NOTE — Progress Notes (Signed)
Office: [phone]  /  Fax: [phone]   HPI:   Chief Complaint: OBESITY  Sharon Rivers (MR# [id number]) is a 46 y.o. female who presents on 03/10/2016 for obesity evaluation and treatment. Current BMI is Body mass index is 51.21 kg/m.[name] Sharon Rivers has struggled with obesity for years and has been unsuccessful in either losing weight or maintaining long term weight loss. Sharon Rivers attended our information session and states she is currently in the action stage of change and ready to dedicate time achieving and maintaining a healthier weight. Patient feels she does not eat a lot, but does make poor food choices.   Sharon Rivers states her desired weight loss is 140 lbs.  she has been heavy most of  her life she started gaining weight around 46 years old. her heaviest weight ever was 295 lbs. she has significant food cravings issues  she snacks frequently in the evenings she wakes up frquently in the middle of the night to eat she is frequently drinking liquids with calories she has problems with excessive hunger    Sharon Rivers feels her energy is lower than it should be. This has worsened with weight gain and has worsened recently. Lashana admits to daytime somnolence and  admits to waking up still tired. Patient is at risk for obstructive sleep apnea. Patent has a history of symptoms of morning Sharon and morning headache. Patient generally gets 2 or 3 hours of sleep per night, and states they generally have nightime awakenings. Snoring is present. Apneic episodes is present. Epworth Sleepiness Score is 22.  Sharon Rivers notes increasing shortness of breath with exercising and seems to be worsening over time with weight gain. She notes getting out of breath sooner with activity than she used to. This has gotten worse recently. Sharon Rivers denies orthopnea.  Sharon Rivers has a diagnosis of hypothyroidism. She is on levothyroxine. She denies hot or cold intolerance or palpitations, but  does admit to ongoing Sharon.  Sharon Rivers is a 46 y.o. female with Sharon.  Sharon Rivers denies chest pain or shortness of breath on exertion. She is working weight loss to help control her blood pressure with the goal of decreasing her risk of heart attack and stroke. Sharon Rivers blood pressure is currently controlled. She is currently on Lisinopril/Amlodipine/HCTZ.    At risk for cardiovascular disease Sharon Rivers is at a higher than average risk for cardiovascular disease due to obesity. She currently denies any chest pain.   Sharon Rivers Food and Mood (modified PHQ-9) score was  Sharon screen PHQ 2/9 03/10/2016  Decreased Interest 3  Down, Depressed, Hopeless 3  PHQ - 2 Score 6  Altered sleeping 3  Tired, decreased energy 3  Change in appetite 3  Feeling bad or failure about yourself  3  Trouble concentrating 3  Moving slowly or fidgety/restless 2  PHQ-9 Score 23    ALLERGIES: Allergies  Allergen Reactions  . Adhesive [Tape] Other (See Comments)    Skin Burn.  [name] [Menthol (Topical Analgesic)] Other (See Comments)    Skin Burn.  . Sulfa Antibiotics Rash    MEDICATIONS: Current Outpatient Prescriptions on File Prior to Visit  Medication Sig Dispense Refill  . amLODipine (NORVASC) 5 MG tablet Take 1 tablet (5 mg total) by mouth daily. 30 tablet 3  . hydrochlorothiazide (HYDRODIURIL) 25 MG tablet Take 1 tablet (25 mg total) by mouth daily. 30 tablet 1  . levothyroxine (SYNTHROID) 175 MCG tablet Take 1 tablet (175 mcg total) by mouth  daily before breakfast. 30 tablet 1  . lisinopril (PRINIVIL,ZESTRIL) 20 MG tablet Take 1 tablet (20 mg total) by mouth daily. 30 tablet 1  . norethindrone (JOLIVETTE) 0.35 MG tablet Take 1 tablet (0.35 mg total) by mouth daily. 1 Package 12  . ondansetron (ZOFRAN) 4 MG tablet Take 1 tablet (4 mg total) by mouth every 8 (eight) hours as needed for nausea or vomiting. 20 tablet 0  . pantoprazole (PROTONIX)  40 MG tablet Take 1 tablet (40 mg total) by mouth daily. 30 tablet 3  . SUMAtriptan (IMITREX) 50 MG tablet Take 1 tablet (50 mg total) by mouth once. May repeat in 2 hours if headache persists or recurs. 10 tablet 0  . venlafaxine XR (EFFEXOR XR) 37.5 MG 24 hr capsule 1 tab by mouth once daily for 3 days, then increase to 2 tabs once daily on day 4 60 capsule 1   No current facility-administered medications on file prior to visit.     PAST MEDICAL HISTORY: Past Medical History:  Diagnosis Date  . Anxiety   . Arthritis   . Back pain   . Chest pain   . Constipation   . Sharon   . Edema    feet and legs  . Frequent headaches   . GERD (gastroesophageal reflux disease)   . Sharon   . Hypothyroidism   . Joint pain   . Migraines   . Osteoarthritis   . Panic anxiety syndrome   . SOB (shortness of breath)   . Swallowing difficulty   . Thyroid disease     PAST SURGICAL HISTORY: Past Surgical History:  Procedure Laterality Date  . CESAREAN SECTION  2001 & 2002  . MINOR CARPAL TUNNEL     pinched nerve in elbow  . WISDOM TOOTH EXTRACTION      SOCIAL HISTORY: Social History  Substance Use Topics  . Smoking status: Former Research scientist (life sciences)  . Smokeless tobacco: Never Used     Comment: Quit >10 years ago  . Alcohol use No    FAMILY HISTORY: Family History  Problem Relation Age of Onset  . Sharon Mother     Living  . Arthritis Mother   . Thyroid disease Mother   . Heart disease Mother   . Heart attack Mother   . Migraines Mother   . Sharon Mother   . Obesity Mother   . Sharon Father     Stepfather  . Sharon Maternal Grandmother   . Parkinson's disease Maternal Grandmother   . Alzheimer's disease Maternal Grandmother   . Cancer Maternal Grandfather   . Sharon Maternal Grandfather   . Sharon Paternal Grandmother   . Sharon Paternal Grandfather   . Gout Brother   . ADD / ADHD Son     x1  . Other Son     #2-Unknown  .  Diabetes Neg Hx     ROS: Review of Systems  Constitutional: Positive for malaise/Sharon.       Trouble sleeping Dry mouth  HENT: Positive for sinus pain and tinnitus.        Headache Nose stuffiness  Eyes: Positive for blurred vision and double vision.       Floaters  Respiratory: Positive for shortness of breath (with activity).        Difficulty Breathing while lying down Sudden awakening from sleep with shortness of breath  Cardiovascular: Positive for chest pain.       Calf/Leg pain with walking Leg cramping  Gastrointestinal: Positive for constipation  and heartburn.       Difficulty swallowing  Musculoskeletal: Positive for back pain and neck pain.       Stiffness  Skin:       Dryness  Psychiatric/Behavioral: Positive for Sharon. The patient is nervous/anxious.        Stress  All other systems reviewed and are negative.   PHYSICAL EXAM: Blood pressure 113/76, pulse 72, temperature 98.5 F (36.9 C), temperature source Oral, resp. rate 12, height 5\' 2"  (1.575 m), weight 280 lb (127 kg), last menstrual period 03/06/2016, SpO2 97 %. Body mass index is 51.21 kg/m. Physical Exam  Constitutional: She is oriented to person, place, and time. She appears well-developed and well-nourished.  HENT:  Head: Normocephalic and atraumatic.  Eyes: Conjunctivae and EOM are normal. Pupils are equal, round, and reactive to light.  Neck: Normal range of motion. Neck supple.  Cardiovascular: Normal rate and regular rhythm.   Pulmonary/Chest: Effort normal and breath sounds normal.  Abdominal: Soft. Bowel sounds are normal.  Musculoskeletal: Normal range of motion. She exhibits edema (on BLE).  Neurological: She is alert and oriented to person, place, and time.  Skin: Skin is warm and dry.  Psychiatric: She has a normal mood and affect. Her behavior is normal.  Vitals reviewed.   RECENT LABS AND TESTS: BMET    Component Value Date/Time   NA 136 02/23/2016 1235   K 3.7  02/23/2016 1235   CL 104 02/23/2016 1235   CO2 27 02/23/2016 1235   GLUCOSE 84 02/23/2016 1235   BUN 14 02/23/2016 1235   CREATININE 0.68 02/23/2016 1235   CALCIUM 9.0 02/23/2016 1235   GFRNONAA >60 07/07/2015 1030   GFRAA >60 07/07/2015 1030   Lab Results  Component Value Date   HGBA1C 4.7 07/22/2015   No results found for: INSULIN CBC    Component Value Date/Time   WBC 6.6 02/23/2016 1235   RBC 4.32 02/23/2016 1235   HGB 12.4 02/23/2016 1235   HCT 35.9 (L) 02/23/2016 1235   PLT 325.0 02/23/2016 1235   MCV 83.3 02/23/2016 1235   MCH 29.4 07/07/2015 1030   MCHC 34.4 02/23/2016 1235   RDW 13.7 02/23/2016 1235   LYMPHSABS 1.3 02/23/2016 1235   MONOABS 0.5 02/23/2016 1235   EOSABS 0.2 02/23/2016 1235   BASOSABS 0.0 02/23/2016 1235   Iron/TIBC/Ferritin/ %Sat No results found for: IRON, TIBC, FERRITIN, IRONPCTSAT Lipid Panel     Component Value Date/Time   CHOL 130 07/22/2015 0847   TRIG 56.0 07/22/2015 0847   HDL 37.20 (L) 07/22/2015 0847   CHOLHDL 3 07/22/2015 0847   VLDL 11.2 07/22/2015 0847   LDLCALC 82 07/22/2015 0847   Hepatic Function Panel     Component Value Date/Time   PROT 7.1 02/23/2016 1235   ALBUMIN 3.7 02/23/2016 1235   AST 18 02/23/2016 1235   ALT 17 02/23/2016 1235   ALKPHOS 50 02/23/2016 1235   BILITOT 0.6 02/23/2016 1235      Component Value Date/Time   TSH 5.19 (H) 02/23/2016 1235   TSH 0.99 10/16/2015 1135   TSH 5.75 (H) 09/04/2015 0704    ECG  shows NSR with a rate of 73 bpm. INDIRECT CALORIMETER done today shows a VO2 of 288 and a REE of 2006.    ASSESSMENT AND PLAN: Other Sharon - Plan: EKG 12-Lead, Vitamin B12, Comprehensive metabolic panel, Folate, Hemoglobin A1c, Insulin, random, VITAMIN D 25 Hydroxy (Vit-D Deficiency, Fractures), Lipid Panel With LDL/HDL Ratio, CANCELED: CBC With Differential  Shortness  of breath on exertion  Essential Sharon  Hypothyroidism, unspecified type - Plan: T3, T4, free, TSH  Sharon  screening  At risk for heart disease  Morbid obesity ([place])  PLAN:  Sharon Rivers was informed that her Sharon may be related to obesity, Sharon or many other causes. Labs will be ordered, and in the meanwhile Sharon Rivers has agreed to work on diet, exercise and weight loss to help with Sharon. Proper sleep hygiene was discussed including the need for 7-8 hours of quality sleep each night. A sleep study was not ordered based on symptoms and Epworth score. Will check labs and follow up.   Sharon Rivers shortness of breath appears to be obesity related and exercise induced. She has agreed to work on weight loss and gradually increase exercise to treat her exercise induced shortness of breath. If Sharon Rivers follows our instructions and loses weight without improvement of her shortness of breath, we will plan to refer to pulmonology. We will monitor this condition regularly. Sharon Rivers agrees to this plan.  Sharon Rivers was informed of the importance of good thyroid control to help with weight loss efforts. She was also informed that supertheraputic thyroid levels are dangerous and will not improve weight loss results. Will check labs and follow up.    Sharon We discussed sodium restriction, working on healthy weight loss, and a regular exercise program as the means to achieve improved blood pressure control. Ernie agreed with this plan and agreed to follow up as directed. We will continue to monitor her blood pressure as well as her progress with the above lifestyle modifications. She will continue her medications as prescribed and will watch for signs of hypotension as she continues her lifestyle modifications.   Cardiovascular risk counselling Yalexi was given extended (at least 15 minutes) coronary artery disease prevention counseling today. She is 46 y.o. female and has risk factors for heart disease including obesity. We discussed intensive lifestyle modifications today  with an emphasis on specific weight loss instructions and strategies. Pt was also informed of the importance of increasing exercise and decreasing saturated fats to help prevent heart disease.  Sharon Screen Mamta had a positive Sharon screening. Sharon is commonly associated with obesity and often results in emotional eating behaviors. We will monitor this closely and work on CBT to help improve the non-hunger eating patterns. Referral to Psychology may be required if no improvement is seen as she continues in our clinic.  Obesity Aliannah is currently in the action stage of change and her goal is to continue with weight loss efforts She has agreed to follow the Category 3 plan Yocelin has been instructed to work up to a goal of 150 minutes of combined cardio and strengthening exercise per week for weight loss and overall health benefits. We discussed the following Behavioral Modification Stratagies today: increasing lean protein intake, decreasing simple carbohydrates , increasing vegetables and increasing fiber rich foods  Mayda has agreed to follow up with our clinic in 2 weeks. She was informed of the importance of frequent follow up visits to maximize her success with intensive lifestyle modifications for her multiple health conditions. She was informed we would discuss her lab results at her next visit unless there is a critical issue that needs to be addressed sooner. Xion agreed to keep her next visit at the agreed upon time to discuss these results.  I, April Moore , am acting as Education administrator for Dennard Nip, MD  I have reviewed the above documentation for  accuracy and completeness, and I agree with the above. -Dennard Nip, MD

## 2016-03-11 LAB — INSULIN, RANDOM: INSULIN: 37.1 u[IU]/mL — ABNORMAL HIGH (ref 2.6–24.9)

## 2016-03-11 LAB — COMPREHENSIVE METABOLIC PANEL
A/G RATIO: 1.4 (ref 1.2–2.2)
ALBUMIN: 4.2 g/dL (ref 3.5–5.5)
ALT: 17 IU/L (ref 0–32)
AST: 13 IU/L (ref 0–40)
Alkaline Phosphatase: 73 IU/L (ref 39–117)
BILIRUBIN TOTAL: 0.4 mg/dL (ref 0.0–1.2)
BUN / CREAT RATIO: 18 (ref 9–23)
BUN: 14 mg/dL (ref 6–24)
CHLORIDE: 97 mmol/L (ref 96–106)
CO2: 25 mmol/L (ref 18–29)
Calcium: 9.3 mg/dL (ref 8.7–10.2)
Creatinine, Ser: 0.78 mg/dL (ref 0.57–1.00)
GFR calc non Af Amer: 93 mL/min/{1.73_m2} (ref >59)
GFR, EST AFRICAN AMERICAN: 107 mL/min/{1.73_m2} (ref >59)
GLUCOSE: 89 mg/dL (ref 65–99)
Globulin, Total: 3 g/dL (ref 1.5–4.5)
POTASSIUM: 4.2 mmol/L (ref 3.5–5.2)
Sodium: 136 mmol/L (ref 134–144)
TOTAL PROTEIN: 7.2 g/dL (ref 6.0–8.5)

## 2016-03-11 LAB — LIPID PANEL WITH LDL/HDL RATIO
Cholesterol, Total: 123 mg/dL (ref 100–199)
HDL: 38 mg/dL — AB (ref >39)
LDL Calculated: 68 mg/dL (ref 0–99)
LDl/HDL Ratio: 1.8 ratio units (ref 0.0–3.2)
TRIGLYCERIDES: 87 mg/dL (ref 0–149)
VLDL Cholesterol Cal: 17 mg/dL (ref 5–40)

## 2016-03-11 LAB — T4, FREE: FREE T4: 1.72 ng/dL (ref 0.82–1.77)

## 2016-03-11 LAB — TSH: TSH: 0.428 u[IU]/mL — AB (ref 0.450–4.500)

## 2016-03-11 LAB — HEMOGLOBIN A1C
Est. average glucose Bld gHb Est-mCnc: 91 mg/dL
Hgb A1c MFr Bld: 4.8 % (ref 4.8–5.6)

## 2016-03-11 LAB — VITAMIN B12: VITAMIN B 12: 415 pg/mL (ref 232–1245)

## 2016-03-11 LAB — T3: T3, Total: 142 ng/dL (ref 71–180)

## 2016-03-11 LAB — VITAMIN D 25 HYDROXY (VIT D DEFICIENCY, FRACTURES): VIT D 25 HYDROXY: 16.2 ng/mL — AB (ref 30.0–100.0)

## 2016-03-11 LAB — FOLATE: FOLATE: 7.8 ng/mL (ref >3.0)

## 2016-03-13 DIAGNOSIS — G4733 Obstructive sleep apnea (adult) (pediatric): Secondary | ICD-10-CM | POA: Diagnosis not present

## 2016-03-14 ENCOUNTER — Ambulatory Visit (INDEPENDENT_AMBULATORY_CARE_PROVIDER_SITE_OTHER): Payer: Commercial Managed Care - PPO | Admitting: Family

## 2016-03-14 ENCOUNTER — Encounter: Payer: Self-pay | Admitting: Family

## 2016-03-14 VITALS — BP 117/79 | HR 85 | Temp 98.2°F | Resp 16 | Ht 62.0 in | Wt 286.0 lb

## 2016-03-14 DIAGNOSIS — Z23 Encounter for immunization: Secondary | ICD-10-CM

## 2016-03-14 DIAGNOSIS — G43809 Other migraine, not intractable, without status migrainosus: Secondary | ICD-10-CM

## 2016-03-14 MED ORDER — KETOROLAC TROMETHAMINE 60 MG/2ML IM SOLN
60.0000 mg | Freq: Once | INTRAMUSCULAR | Status: AC
  Administered 2016-03-14: 60 mg via INTRAMUSCULAR

## 2016-03-14 MED ORDER — LORATADINE 10 MG PO TABS: 10.0000 mg | ORAL_TABLET | Freq: Every day | ORAL | 11 refills | Status: DC

## 2016-03-14 MED ORDER — FLUTICASONE PROPIONATE 50 MCG/ACT NA SUSP: 2.0000 | Freq: Every day | NASAL | 0 refills | Status: DC

## 2016-03-14 NOTE — Progress Notes (Signed)
Pre visit review using our clinic review tool, if applicable. No additional management support is needed unless otherwise documented below in the visit note. 

## 2016-03-14 NOTE — Patient Instructions (Signed)
Please begin flonase nasal spray and claritin (otc) for your nasal congestion. You may continue imitrex on an as needed basis as well as zofran as needed for nausea. Call if headache worsens or if headache does not improve. Call if nasal congestion is not improved in 1 week.

## 2016-03-14 NOTE — Progress Notes (Signed)
Subjective:    Patient ID: Sharon Rivers, female    DOB: Jan 16, 1971, 46 y.o.   MRN: [id number]  HPI  Sharon Rivers is 46 yr old female with chief complaint of migraine. Began 2 days ago.  Resolves briefly with imitrex, then "comes right back." reports associated nausea without vomiting.  Feels like "my whole head is going to blow apart." she reports that her headache is 10/10.  Notes that when she blows her nose she is blowing out blood.  Has had nasal congestion for 2-3 days. She notes some tenderness on her forehead and at the back of her head. She denies fever.     Review of Systems See HPI  Past Medical History:  Diagnosis Date  . Anxiety   . Arthritis   . Back pain   . Chest pain   . Constipation   . Depression   . Edema    feet and legs  . Frequent headaches   . GERD (gastroesophageal reflux disease)   . Hypertension   . Hypothyroidism   . Joint pain   . Migraines   . Osteoarthritis   . Panic anxiety syndrome   . SOB (shortness of breath)   . Swallowing difficulty   . Thyroid disease      Social History   Social History  . Marital status: Single    Spouse name: N/A  . Number of children: N/A  . Years of education: N/A   Occupational History  . Assembler     [employer]   Social History Main Topics  . Smoking status: Former Research scientist (life sciences)  . Smokeless tobacco: Never Used     Comment: Quit >10 years ago  . Alcohol use No  . Drug use: No  . Sexual activity: Yes    Partners: Female     Comment: OCP   Other Topics Concern  . Not on file   Social History Narrative  . No narrative on file    Past Surgical History:  Procedure Laterality Date  . CESAREAN SECTION  2001 & 2002  . MINOR CARPAL TUNNEL     pinched nerve in elbow  . WISDOM TOOTH EXTRACTION      Family History  Problem Relation Age of Onset  . Hypertension Mother     Living  . Arthritis Mother   . Thyroid disease Mother   . Heart disease Mother   . Heart attack Mother   . Migraines Mother    . Depression Mother   . Obesity Mother   . Hypertension Father     Stepfather  . Hypertension Maternal Grandmother   . Parkinson's disease Maternal Grandmother   . Alzheimer's disease Maternal Grandmother   . Cancer Maternal Grandfather   . Hypertension Maternal Grandfather   . Hypertension Paternal Grandmother   . Hypertension Paternal Grandfather   . Gout Brother   . ADD / ADHD Son     x1  . Other Son     #2-Unknown  . Diabetes Neg Hx     Allergies  Allergen Reactions  . Adhesive [Tape] Other (See Comments)    Skin Burn.  [name] [Menthol (Topical Analgesic)] Other (See Comments)    Skin Burn.  . Sulfa Antibiotics Rash    Current Outpatient Prescriptions on File Prior to Visit  Medication Sig Dispense Refill  . amLODipine (NORVASC) 5 MG tablet Take 1 tablet (5 mg total) by mouth daily. 30 tablet 3  . gabapentin (NEURONTIN) 300 MG capsule Take 300  mg by mouth 3 (three) times daily.    . hydrochlorothiazide (HYDRODIURIL) 25 MG tablet Take 1 tablet (25 mg total) by mouth daily. 30 tablet 1  . levothyroxine (SYNTHROID) 175 MCG tablet Take 1 tablet (175 mcg total) by mouth daily before breakfast. 30 tablet 1  . lisinopril (PRINIVIL,ZESTRIL) 20 MG tablet Take 1 tablet (20 mg total) by mouth daily. 30 tablet 1  . norethindrone (JOLIVETTE) 0.35 MG tablet Take 1 tablet (0.35 mg total) by mouth daily. 1 Package 12  . ondansetron (ZOFRAN) 4 MG tablet Take 1 tablet (4 mg total) by mouth every 8 (eight) hours as needed for nausea or vomiting. 20 tablet 0  . pantoprazole (PROTONIX) 40 MG tablet Take 1 tablet (40 mg total) by mouth daily. 30 tablet 3  . SUMAtriptan (IMITREX) 50 MG tablet Take 1 tablet (50 mg total) by mouth once. May repeat in 2 hours if headache persists or recurs. 10 tablet 0  . venlafaxine XR (EFFEXOR XR) 37.5 MG 24 hr capsule 1 tab by mouth once daily for 3 days, then increase to 2 tabs once daily on day 4 60 capsule 1   No current facility-administered  medications on file prior to visit.     BP 117/79 (BP Location: Left Arm, Cuff Size: Large)   Pulse 85   Temp 98.2 F (36.8 C) (Oral)   Resp 16   Ht 5\' 2"  (1.575 m)   Wt 286 lb (129.7 kg)   LMP 03/06/2016   SpO2 100% Comment: room air  BMI 52.31 kg/m       Objective:   Physical Exam  Constitutional: She is oriented to person, place, and time. She appears well-developed and well-nourished.  HENT:  Head: Normocephalic and atraumatic.  Right Ear: Tympanic membrane and ear canal normal.  Left Ear: Tympanic membrane normal.  Nose: Right sinus exhibits no maxillary sinus tenderness and no frontal sinus tenderness. Left sinus exhibits no maxillary sinus tenderness and no frontal sinus tenderness.  Poor dentition  Eyes: EOM are normal. Pupils are equal, round, and reactive to light.  Cardiovascular: Normal rate, regular rhythm and normal heart sounds.   No murmur heard. Pulmonary/Chest: Effort normal and breath sounds normal. No respiratory distress. She has no wheezes.  Musculoskeletal: She exhibits no edema.  Neurological: She is alert and oriented to person, place, and time. No cranial nerve deficit. She exhibits normal muscle tone. Coordination normal.  Psychiatric: She has a normal mood and affect. Her behavior is normal. Judgment and thought content normal.          Assessment & Plan:  Tdap today

## 2016-03-14 NOTE — Assessment & Plan Note (Signed)
Deteriorated.  Will give toradol 60mg  IM x 1 in the office today.  Advised pt OK to continue imitrex prn and zofran prn. She is advised to call if symptoms worsen or if symptoms fail to improve.   She is also complaining of some nasal congestion- will rx with flonase and otc claritin.  Could be viral etiology. She is advised to call if symptoms worsen or if not improved in 1 week.

## 2016-03-15 ENCOUNTER — Telehealth: Payer: Self-pay | Admitting: Family

## 2016-03-15 ENCOUNTER — Other Ambulatory Visit: Payer: Self-pay | Admitting: *Deleted

## 2016-03-15 DIAGNOSIS — R400 Somnolence: Secondary | ICD-10-CM

## 2016-03-15 DIAGNOSIS — G4733 Obstructive sleep apnea (adult) (pediatric): Secondary | ICD-10-CM

## 2016-03-15 NOTE — Telephone Encounter (Signed)
Attempted to reach pt and left message to return my call. 

## 2016-03-15 NOTE — Telephone Encounter (Signed)
Patient would like to extend Dr. Note returning back to work 03/16/16. Patient would like to pick up letter when ready, please advise

## 2016-03-15 NOTE — Telephone Encounter (Signed)
Pt states the headache went away. States she feels bruised in the hip at the injection site and it hurts to wear anything. States this happens every time she gets an injection and she is usually very sore for a couple of days afterward. Pt is asking to extend work note due to this until tomorrow.  Please advise?

## 2016-03-15 NOTE — Telephone Encounter (Signed)
Spoke with pt and she voices understanding. Pt will pick up letter tomorrow. Letter placed at front desk for pick up.

## 2016-03-15 NOTE — Telephone Encounter (Signed)
Ok. She should see [name] back in the office is the soreness worsens or fails to improve in the next day or two please.

## 2016-03-16 ENCOUNTER — Encounter: Payer: Self-pay | Admitting: Family

## 2016-03-16 DIAGNOSIS — G43909 Migraine, unspecified, not intractable, without status migrainosus: Secondary | ICD-10-CM

## 2016-03-16 MED ORDER — AMITRIPTYLINE HCL 25 MG PO TABS: 25.0000 mg | ORAL_TABLET | Freq: Every day | ORAL | 1 refills | Status: DC

## 2016-03-17 ENCOUNTER — Encounter: Payer: Self-pay | Admitting: Family

## 2016-03-17 ENCOUNTER — Telehealth: Payer: Self-pay | Admitting: Family

## 2016-03-17 DIAGNOSIS — G4733 Obstructive sleep apnea (adult) (pediatric): Secondary | ICD-10-CM

## 2016-03-17 HISTORY — DX: Obstructive sleep apnea (adult) (pediatric): G47.33

## 2016-03-17 NOTE — Telephone Encounter (Signed)
Sleep study shows severe osa, needs titration in lab for cpap.

## 2016-03-18 NOTE — Addendum Note (Signed)
Addended by: Kelle Darting A on: 03/18/2016 02:58 PM   Modules accepted: Orders

## 2016-03-18 NOTE — Telephone Encounter (Signed)
Order re-entered.  Sharon Rivers  Ronny Flurry, CMA        Can you change place of service to [place]. Thanks

## 2016-03-18 NOTE — Telephone Encounter (Signed)
Notified pt and she voices understanding. 

## 2016-03-21 ENCOUNTER — Encounter: Payer: Self-pay | Admitting: Family

## 2016-03-21 NOTE — Telephone Encounter (Signed)
Spoke with pt. She states that she woke up with a slight headache and took her imitrex. States she started to feel dizzy after this. Reports that she feels lightheaded when she gets up to move around, has a constant pressure in her chest that tightens when she tries to take a deep breath. Per verbal from RN, pt should proceed to the ER for further evaluation. Notified pt and she voices understanding.

## 2016-03-22 ENCOUNTER — Emergency Department (HOSPITAL_BASED_OUTPATIENT_CLINIC_OR_DEPARTMENT_OTHER): Payer: Commercial Managed Care - PPO

## 2016-03-22 ENCOUNTER — Emergency Department (HOSPITAL_BASED_OUTPATIENT_CLINIC_OR_DEPARTMENT_OTHER)
Admission: EM | Admit: 2016-03-22 | Discharge: 2016-03-22 | Disposition: A | Discharge status: Home / Self Care | Payer: Commercial Managed Care - PPO | Attending: Emergency Medicine | Admitting: Emergency Medicine

## 2016-03-22 ENCOUNTER — Encounter (HOSPITAL_BASED_OUTPATIENT_CLINIC_OR_DEPARTMENT_OTHER): Payer: Self-pay | Admitting: *Deleted

## 2016-03-22 DIAGNOSIS — R079 Chest pain, unspecified: Secondary | ICD-10-CM | POA: Insufficient documentation

## 2016-03-22 DIAGNOSIS — Z79899 Other long term (current) drug therapy: Secondary | ICD-10-CM | POA: Diagnosis not present

## 2016-03-22 DIAGNOSIS — R42 Dizziness and giddiness: Secondary | ICD-10-CM | POA: Diagnosis not present

## 2016-03-22 DIAGNOSIS — Z87891 Personal history of nicotine dependence: Secondary | ICD-10-CM | POA: Insufficient documentation

## 2016-03-22 DIAGNOSIS — R0602 Shortness of breath: Secondary | ICD-10-CM | POA: Insufficient documentation

## 2016-03-22 DIAGNOSIS — E039 Hypothyroidism, unspecified: Secondary | ICD-10-CM | POA: Insufficient documentation

## 2016-03-22 DIAGNOSIS — I10 Essential (primary) hypertension: Secondary | ICD-10-CM | POA: Insufficient documentation

## 2016-03-22 LAB — CBC
HCT: 38.2 % (ref 36.0–46.0)
HEMOGLOBIN: 13.1 g/dL (ref 12.0–15.0)
MCH: 28.9 pg (ref 26.0–34.0)
MCHC: 34.3 g/dL (ref 30.0–36.0)
MCV: 84.1 fL (ref 78.0–100.0)
PLATELETS: 326 10*3/uL (ref 150–400)
RBC: 4.54 MIL/uL (ref 3.87–5.11)
RDW: 13.6 % (ref 11.5–15.5)
WBC: 8.7 10*3/uL (ref 4.0–10.5)

## 2016-03-22 LAB — BASIC METABOLIC PANEL
ANION GAP: 8 (ref 5–15)
BUN: 14 mg/dL (ref 6–20)
CALCIUM: 9 mg/dL (ref 8.9–10.3)
CO2: 29 mmol/L (ref 22–32)
CREATININE: 0.89 mg/dL (ref 0.44–1.00)
Chloride: 98 mmol/L — ABNORMAL LOW (ref 101–111)
GFR calc Af Amer: >60 mL/min (ref >60)
GLUCOSE: 93 mg/dL (ref 65–99)
Potassium: 3.6 mmol/L (ref 3.5–5.1)
Sodium: 135 mmol/L (ref 135–145)

## 2016-03-22 LAB — D-DIMER, QUANTITATIVE (NOT AT ARMC): D DIMER QUANT: 0.5 ug{FEU}/mL (ref 0.00–0.50)

## 2016-03-22 LAB — TROPONIN I: Troponin I: <0.03 ng/mL (ref <0.03)

## 2016-03-22 MED ORDER — ASPIRIN 81 MG PO CHEW
324.0000 mg | CHEWABLE_TABLET | Freq: Once | ORAL | Status: AC
  Administered 2016-03-22: 324 mg via ORAL
  Filled 2016-03-22: qty 4

## 2016-03-22 MED ORDER — GI COCKTAIL ~~LOC~~
30.0000 mL | Freq: Once | ORAL | Status: AC
  Administered 2016-03-22: 30 mL via ORAL
  Filled 2016-03-22: qty 30

## 2016-03-22 NOTE — ED Triage Notes (Signed)
Patient states she developed light chest pain yesterday.  Today, the chest pain returned while she as working.  Describes the pain as across her entire chest, a tightness and is associated with lightheadedness.  Has a history of same.

## 2016-03-22 NOTE — Discharge Instructions (Signed)

## 2016-03-22 NOTE — ED Provider Notes (Signed)
Emergency Department Provider Note  By signing my name below, I, Higinio Plan, attest that this documentation has been prepared under the direction and in the presence of Margette Fast, MD . Electronically Signed: Higinio Plan, Scribe. 03/22/2016. 6:53 PM.  I have reviewed the triage vital signs and the nursing notes.  HISTORY  Chief Complaint Chest Pain  HPI Comments: Shaivi Deboe is a 46 y.o. female with PMHx of HTN, who presents to the Emergency Department complaining of gradually improving, intermittent, "squeezing," chest pain that began yesterday evening while at work. Pt reports her pain is exacerbated with exertion but temporarily relieved when lying still and does not radiate anywhere. She states associated shortness of breath and lightheadedness. She notes she has not taken any aspirin or other medications for her pain. Pt denies any recent surgeries or hospitalizations, recent long travel, and hx of DVT.   Past Medical History:  Diagnosis Date  . Anxiety   . Arthritis   . Back pain   . Chest pain   . Constipation   . Depression   . Edema    feet and legs  . Frequent headaches   . GERD (gastroesophageal reflux disease)   . Hypertension   . Hypothyroidism   . Joint pain   . Migraines   . OSA (obstructive sleep apnea) 03/17/2016  . Osteoarthritis   . Panic anxiety syndrome   . SOB (shortness of breath)   . Swallowing difficulty   . Thyroid disease     Patient Active Problem List   Diagnosis Date Noted  . OSA (obstructive sleep apnea) 03/17/2016  . Insomnia 02/23/2016  . GERD (gastroesophageal reflux disease) 02/23/2016  . Migraines 02/23/2016  . Depression 02/23/2016  . Hypertension 07/22/2015  . Lateral epicondylitis 07/22/2015  . Thyroid activity decreased 07/22/2015  . Family history of early CAD 07/22/2015  . Carpal tunnel syndrome 04/30/2015  . Morbid obesity ([place]) 09/12/2013    Past Surgical History:  Procedure Laterality Date  . CESAREAN SECTION   2001 & 2002  . ELBOW SURGERY    . MINOR CARPAL TUNNEL     pinched nerve in elbow  . WISDOM TOOTH EXTRACTION      Current Outpatient Rx  . Order #: [id number] Class: Normal  . Order #: [id number] Class: Normal  . Order #: [id number] Class: Normal  . Order #: [id number] Class: Historical Med  . Order #: [id number] Class: Normal  . Order #: [id number] Class: Normal  . Order #: [id number] Class: Normal  . Order #: [id number] Class: OTC  . Order #: [id number] Class: Normal  . Order #: [id number] Class: Normal  . Order #: [id number] Class: Normal  . Order #: [id number] Class: Normal  . Order #: [id number] Class: Normal    Allergies Adhesive [tape]; Biofreeze [menthol (topical analgesic)]; and Sulfa antibiotics  Family History  Problem Relation Age of Onset  . Hypertension Mother     Living  . Arthritis Mother   . Thyroid disease Mother   . Heart disease Mother   . Heart attack Mother   . Migraines Mother   . Depression Mother   . Obesity Mother   . Hypertension Father     Stepfather  . Hypertension Maternal Grandmother   . Parkinson's disease Maternal Grandmother   . Alzheimer's disease Maternal Grandmother   . Cancer Maternal Grandfather   . Hypertension Maternal Grandfather   . Hypertension Paternal Grandmother   . Hypertension Paternal Grandfather   . Gout Brother   . ADD / ADHD Son     x1  .  Other Son     #2-Unknown  . Diabetes Neg Hx     Social History Social History  Substance Use Topics  . Smoking status: Former Research scientist (life sciences)  . Smokeless tobacco: Never Used     Comment: Quit >10 years ago  . Alcohol use No    Review of Systems  Constitutional: No fever/chills Eyes: No visual changes. ENT: No sore throat. Cardiovascular: Positive chest pain with lightheadedness.  Respiratory: Denies shortness of breath. Gastrointestinal: No abdominal pain.  No nausea, no vomiting.  No diarrhea.  No constipation. Genitourinary: Negative for dysuria. Musculoskeletal: Negative for back  pain. Skin: Negative for rash. Neurological: Negative for headaches, focal weakness or numbness.  10-point ROS otherwise negative.  ____________________________________________   PHYSICAL EXAM:  VITAL SIGNS: ED Triage Vitals  Enc Vitals Group     BP 03/22/16 1809 126/84     Pulse Rate 03/22/16 1809 106     Resp 03/22/16 1809 20     Temp 03/22/16 1809 97.8 F (36.6 C)     Temp Source 03/22/16 1809 Oral     SpO2 03/22/16 1809 100 %     Weight 03/22/16 1810 285 lb (129.3 kg)     Height 03/22/16 1810 5\' 2"  (1.575 m)     Pain Score 03/22/16 1810 8    Constitutional: Alert and oriented. Well appearing and in no acute distress. Eyes: Conjunctivae are normal.  Head: Atraumatic. Nose: No congestion/rhinnorhea. Mouth/Throat: Mucous membranes are moist.  Neck: No stridor.   Cardiovascular: Normal rate, regular rhythm. Good peripheral circulation. Grossly normal heart sounds.   Respiratory: Normal respiratory effort.  No retractions. Lungs CTAB. Gastrointestinal: Soft and nontender. No distention.  Musculoskeletal: No lower extremity tenderness nor edema. No gross deformities of extremities. Neurologic:  Normal speech and language. No gross focal neurologic deficits are appreciated.  Skin:  Skin is warm, dry and intact. No rash noted.   ____________________________________________   LABS (all labs ordered are listed, but only abnormal results are displayed)  Labs Reviewed  BASIC METABOLIC PANEL - Abnormal; Notable for the following:       Result Value   Chloride 98 (*)    All other components within normal limits  CBC  TROPONIN I  D-DIMER, QUANTITATIVE (NOT AT [facility])  TROPONIN I   ____________________________________________  EKG   EKG Interpretation  Date/Time:  Tuesday March 22 2016 18:10:15 EST Ventricular Rate:  103 PR Interval:  170 QRS Duration: 84 QT Interval:  346 QTC Calculation: 453 R Axis:   14 Text Interpretation:  Sinus tachycardia Cannot rule  out Anterior infarct , age undetermined Abnormal ECG No STEMI.  Confirmed by LONG MD, JOSHUA [phone]) on 03/22/2016 11:41:49 PM       ____________________________________________  RADIOLOGY  Dg Chest 2 View  Result Date: 03/22/2016 CLINICAL DATA:  Chest pain EXAM: CHEST  2 VIEW COMPARISON:  Jul 07, 2015 FINDINGS: There is no edema or consolidation. Heart size and pulmonary vascularity are within normal limits. No pneumothorax. No adenopathy. No bone lesions. IMPRESSION: No edema or consolidation Electronically Signed   By: Lowella Grip III M.D.   On: 03/22/2016 19:16   ____________________________________________   PROCEDURES  Procedure(s) performed:   Procedures  None ____________________________________________   INITIAL IMPRESSION / ASSESSMENT AND PLAN / ED COURSE  Pertinent labs & imaging results that were available during my care of the patient were reviewed by me and considered in my medical decision making (see chart for details).  Patient presents to the ED for  evaluation of CP and lightheadedness. Normal troponin trending. Normal d-dimer with low pre-test probability. EKG unremarkable. Plan for discharge home with PCP and Cardiology follow up for consideration of outpatient ECHO and Stress Test. Discussed my impression and plan with the patient in detail.   At this time, I do not feel there is any life-threatening condition present. I have reviewed and discussed all results (EKG, imaging, lab, urine as appropriate), exam findings with patient. I have reviewed nursing notes and appropriate previous records.  I feel the patient is safe to be discharged home without further emergent workup. Discussed usual and customary return precautions. Patient and family (if present) verbalize understanding and are comfortable with this plan.  Patient will follow-up with their primary care provider. If they do not have a primary care provider, information for follow-up has been provided  to them. All questions have been answered.   I personally performed the services described in this documentation, which was scribed in my presence. The recorded information has been reviewed and is accurate.    ____________________________________________  FINAL CLINICAL IMPRESSION(S) / ED DIAGNOSES  Final diagnoses:  Nonspecific chest pain     MEDICATIONS GIVEN DURING THIS VISIT:  Medications  aspirin chewable tablet 324 mg (324 mg Oral Given 03/22/16 1845)  gi cocktail (Maalox,Lidocaine,Donnatal) (30 mLs Oral Given 03/22/16 1845)     NEW OUTPATIENT MEDICATIONS STARTED DURING THIS VISIT:  None   Note:  This document was prepared using Dragon voice recognition software and may include unintentional dictation errors.  Nanda Quinton, MD Emergency Medicine    Margette Fast, MD 03/23/16 [phone]

## 2016-03-23 ENCOUNTER — Encounter: Payer: Self-pay | Admitting: Family

## 2016-03-24 ENCOUNTER — Ambulatory Visit (INDEPENDENT_AMBULATORY_CARE_PROVIDER_SITE_OTHER): Payer: Commercial Managed Care - PPO | Admitting: Family Medicine

## 2016-03-24 VITALS — BP 110/77 | HR 91 | Ht 62.0 in | Wt 286.0 lb

## 2016-03-24 DIAGNOSIS — Z9189 Other specified personal risk factors, not elsewhere classified: Secondary | ICD-10-CM

## 2016-03-24 DIAGNOSIS — E8881 Metabolic syndrome: Secondary | ICD-10-CM | POA: Diagnosis not present

## 2016-03-24 DIAGNOSIS — E559 Vitamin D deficiency, unspecified: Secondary | ICD-10-CM

## 2016-03-24 MED ORDER — VITAMIN D (ERGOCALCIFEROL) 1.25 MG (50000 UNIT) PO CAPS: 50000.0000 [IU] | ORAL_CAPSULE | ORAL | 0 refills | Status: DC

## 2016-03-24 MED ORDER — METFORMIN HCL 500 MG PO TABS: 500.0000 mg | ORAL_TABLET | Freq: Every day | ORAL | 0 refills | Status: DC

## 2016-03-24 NOTE — Progress Notes (Signed)
Office: [phone]  /  Fax: [phone]   HPI:   Chief Complaint: OBESITY Sharon Rivers is here to discuss her progress with her obesity treatment plan. She is following her eating plan approximately 75 % of the time and states she is exercising 0 minutes 0 times per week. Sharon Rivers hasn't followed the plan well over the last week. She states she hasn't felt good and went to the emergency department with chest pain since our last visit. She states she is confused easily, especially in the last 2 days. She states she followed the plan for the first week. Her weight is 286 lb (129.7 kg) today and has had a weight gain of 6 lbs over a period of 2 weeks since her last visit. She has gained 6 lbs since starting treatment with [name].  Vitamin D deficiency Sharon Rivers has a new diagnosis of vitamin D deficiency, very low at 16.2 She is not currently taking vit D. She admits fatigue and denies nausea, vomiting or muscle weakness.  Insulin Resistance Sharon Rivers has a new diagnosis of insulin resistance based on her elevated fasting insulin level very high at 37. She admits polyphagia. She has a normal Hgb A1c and glucose. Although Sharon Rivers's blood glucose readings are still under good control, insulin resistance puts her at greater risk of metabolic syndrome and diabetes. Her elevated insulin may be contributing to her polyphagia and feeling weak and tired. She is not taking metformin currently and continues to work on diet and exercise to decrease risk of diabetes.  At risk for diabetes Sharon Rivers is at higher than average risk for developing diabetes due to her obesity. She currently denies polyuria or polydipsia.  Wt Readings from Last 500 Encounters:  03/24/16 286 lb (129.7 kg)  03/22/16 285 lb (129.3 kg)  03/14/16 286 lb (129.7 kg)  03/10/16 280 lb (127 kg)  02/23/16 295 lb 6.4 oz (134 kg)  11/12/15 282 lb (127.9 kg)  10/16/15 280 lb 8 oz (127.2 kg)  08/14/15 279 lb 6 oz (126.7 kg)  08/05/15 282 lb (127.9 kg)    07/29/15 274 lb 8 oz (124.5 kg)  07/22/15 282 lb 4 oz (128 kg)  07/14/15 279 lb (126.6 kg)  07/07/15 279 lb 11.2 oz (126.9 kg)     ALLERGIES: Allergies  Allergen Reactions  . Adhesive [Tape] Other (See Comments)    Skin Burn.  [name] [Menthol (Topical Analgesic)] Other (See Comments)    Skin Burn.  . Sulfa Antibiotics Rash    MEDICATIONS: Current Outpatient Prescriptions on File Prior to Visit  Medication Sig Dispense Refill  . amitriptyline (ELAVIL) 25 MG tablet Take 1 tablet (25 mg total) by mouth at bedtime. 30 tablet 1  . amLODipine (NORVASC) 5 MG tablet Take 1 tablet (5 mg total) by mouth daily. 30 tablet 3  . fluticasone (FLONASE) 50 MCG/ACT nasal spray Place 2 sprays into both nostrils daily. 16 g 0  . gabapentin (NEURONTIN) 300 MG capsule Take 300 mg by mouth 3 (three) times daily.    . hydrochlorothiazide (HYDRODIURIL) 25 MG tablet Take 1 tablet (25 mg total) by mouth daily. 30 tablet 1  . levothyroxine (SYNTHROID) 175 MCG tablet Take 1 tablet (175 mcg total) by mouth daily before breakfast. 30 tablet 1  . lisinopril (PRINIVIL,ZESTRIL) 20 MG tablet Take 1 tablet (20 mg total) by mouth daily. 30 tablet 1  . loratadine (CLARITIN) 10 MG tablet Take 1 tablet (10 mg total) by mouth daily. 30 tablet 11  . norethindrone (JOLIVETTE) 0.35  MG tablet Take 1 tablet (0.35 mg total) by mouth daily. 1 Package 12  . ondansetron (ZOFRAN) 4 MG tablet Take 1 tablet (4 mg total) by mouth every 8 (eight) hours as needed for nausea or vomiting. 20 tablet 0  . pantoprazole (PROTONIX) 40 MG tablet Take 1 tablet (40 mg total) by mouth daily. 30 tablet 3  . SUMAtriptan (IMITREX) 50 MG tablet Take 1 tablet (50 mg total) by mouth once. May repeat in 2 hours if headache persists or recurs. 10 tablet 0  . venlafaxine XR (EFFEXOR XR) 37.5 MG 24 hr capsule 1 tab by mouth once daily for 3 days, then increase to 2 tabs once daily on day 4 60 capsule 1   No current facility-administered medications on  file prior to visit.     PAST MEDICAL HISTORY: Past Medical History:  Diagnosis Date  . Anxiety   . Arthritis   . Back pain   . Chest pain   . Constipation   . Depression   . Edema    feet and legs  . Frequent headaches   . GERD (gastroesophageal reflux disease)   . Hypertension   . Hypothyroidism   . Joint pain   . Migraines   . OSA (obstructive sleep apnea) 03/17/2016  . Osteoarthritis   . Panic anxiety syndrome   . SOB (shortness of breath)   . Swallowing difficulty   . Thyroid disease     PAST SURGICAL HISTORY: Past Surgical History:  Procedure Laterality Date  . CESAREAN SECTION  2001 & 2002  . ELBOW SURGERY    . MINOR CARPAL TUNNEL     pinched nerve in elbow  . WISDOM TOOTH EXTRACTION      SOCIAL HISTORY: Social History  Substance Use Topics  . Smoking status: Former Research scientist (life sciences)  . Smokeless tobacco: Never Used     Comment: Quit >10 years ago  . Alcohol use No    FAMILY HISTORY: Family History  Problem Relation Age of Onset  . Hypertension Mother     Living  . Arthritis Mother   . Thyroid disease Mother   . Heart disease Mother   . Heart attack Mother   . Migraines Mother   . Depression Mother   . Obesity Mother   . Hypertension Father     Stepfather  . Hypertension Maternal Grandmother   . Parkinson's disease Maternal Grandmother   . Alzheimer's disease Maternal Grandmother   . Cancer Maternal Grandfather   . Hypertension Maternal Grandfather   . Hypertension Paternal Grandmother   . Hypertension Paternal Grandfather   . Gout Brother   . ADD / ADHD Son     x1  . Other Son     #2-Unknown  . Diabetes Neg Hx     ROS: Review of Systems  Constitutional: Positive for malaise/fatigue. Negative for weight loss.  Gastrointestinal: Negative for nausea and vomiting.  Genitourinary: Negative for frequency.  Musculoskeletal:       Negative muscle weakness  Neurological: Positive for weakness.  Endo/Heme/Allergies: Negative for polydipsia.        Polyphagia    PHYSICAL EXAM: Blood pressure 110/77, pulse 91, height 5\' 2"  (1.575 m), weight 286 lb (129.7 kg), last menstrual period 03/06/2016, SpO2 99 %. Body mass index is 52.31 kg/m. Physical Exam  Constitutional: She is oriented to person, place, and time. She appears well-developed and well-nourished.  Cardiovascular: Normal rate.   Pulmonary/Chest: Effort normal.  Musculoskeletal: Normal range of motion.  Neurological: She is  oriented to person, place, and time.  Skin: Skin is warm and dry.  Psychiatric: She has a normal mood and affect. Her behavior is normal.  Vitals reviewed.   RECENT LABS AND TESTS: BMET    Component Value Date/Time   NA 135 03/22/2016 1828   NA 136 03/10/2016 1051   K 3.6 03/22/2016 1828   CL 98 (L) 03/22/2016 1828   CO2 29 03/22/2016 1828   GLUCOSE 93 03/22/2016 1828   BUN 14 03/22/2016 1828   BUN 14 03/10/2016 1051   CREATININE 0.89 03/22/2016 1828   CALCIUM 9.0 03/22/2016 1828   GFRNONAA >60 03/22/2016 1828   GFRAA >60 03/22/2016 1828   Lab Results  Component Value Date   HGBA1C 4.8 03/10/2016   HGBA1C 4.7 07/22/2015   Lab Results  Component Value Date   INSULIN 37.1 (H) 03/10/2016   CBC    Component Value Date/Time   WBC 8.7 03/22/2016 1828   RBC 4.54 03/22/2016 1828   HGB 13.1 03/22/2016 1828   HCT 38.2 03/22/2016 1828   PLT 326 03/22/2016 1828   MCV 84.1 03/22/2016 1828   MCH 28.9 03/22/2016 1828   MCHC 34.3 03/22/2016 1828   RDW 13.6 03/22/2016 1828   LYMPHSABS 1.3 02/23/2016 1235   MONOABS 0.5 02/23/2016 1235   EOSABS 0.2 02/23/2016 1235   BASOSABS 0.0 02/23/2016 1235   Iron/TIBC/Ferritin/ %Sat No results found for: IRON, TIBC, FERRITIN, IRONPCTSAT Lipid Panel     Component Value Date/Time   CHOL 123 03/10/2016 1051   TRIG 87 03/10/2016 1051   HDL 38 (L) 03/10/2016 1051   CHOLHDL 3 07/22/2015 0847   VLDL 11.2 07/22/2015 0847   LDLCALC 68 03/10/2016 1051   Hepatic Function Panel     Component Value  Date/Time   PROT 7.2 03/10/2016 1051   ALBUMIN 4.2 03/10/2016 1051   AST 13 03/10/2016 1051   ALT 17 03/10/2016 1051   ALKPHOS 73 03/10/2016 1051   BILITOT 0.4 03/10/2016 1051      Component Value Date/Time   TSH 0.428 (L) 03/10/2016 1051   TSH 5.19 (H) 02/23/2016 1235   TSH 0.99 10/16/2015 1135    ASSESSMENT AND PLAN: Vitamin D deficiency - Plan: Vitamin D, Ergocalciferol, (DRISDOL) 50000 units CAPS capsule  Insulin resistance  At risk for diabetes mellitus  Morbid obesity ([place])  PLAN:  Vitamin D Deficiency Sharon Rivers was informed that low vitamin D levels contributes to fatigue and are associated with obesity, breast, and colon cancer. She agrees to start to take prescription Vit D @50 ,000 IU every week #4 with no refills and will follow up for routine testing of vitamin D, at least 2-3 times per year. She was informed of the risk of over-replacement of vitamin D and agrees to not increase her dose unless he discusses this with [name] first.  Insulin Resistance Sharon Rivers will continue to work on weight loss, exercise, and decreasing simple carbohydrates in her diet to help decrease the risk of diabetes. We dicussed metformin including benefits and risks. She was informed that eating too many simple carbohydrates or too many calories at one sitting increases the likelihood of GI side effects. Sharon Rivers requested metformin for now and prescription was written today for Metformin 500 mg every morning #30 with no refills. Sharon Rivers agreed to follow up with [name] as directed to monitor her progress.  Diabetes risk counselling Sharon Rivers was given extended (at least 15 minutes) diabetes prevention counseling today. She is 46 y.o. female and has risk factors for  diabetes including obesity. We discussed intensive lifestyle modifications today with an emphasis on weight loss as well as increasing exercise and decreasing simple carbohydrates in her diet.  Obesity Sharon Rivers is currently in the action stage of  change. As such, her goal is to get back to weight loss efforts  She has agreed to get back to strict Category 3 plan Sharon Rivers has been instructed to work up to a goal of 150 minutes of combined cardio and strengthening exercise per week for weight loss and overall health benefits. We discussed the following Behavioral Modification Stratagies today: increasing lean protein intake, decreasing simple carbohydrates , increasing vegetables and work on meal planning and easy cooking plans  Sharon Rivers has agreed to follow up with our clinic in 2 weeks. She was informed of the importance of frequent follow up visits to maximize her success with intensive lifestyle modifications for her multiple health conditions.  I, Doreene Nest, am acting as scribe for Dennard Nip, MD  I have reviewed the above documentation for accuracy and completeness, and I agree with the above. -Dennard Nip, MD

## 2016-03-25 ENCOUNTER — Telehealth: Payer: Self-pay | Admitting: *Deleted

## 2016-03-25 ENCOUNTER — Encounter: Payer: Self-pay | Admitting: Family

## 2016-03-25 ENCOUNTER — Ambulatory Visit (INDEPENDENT_AMBULATORY_CARE_PROVIDER_SITE_OTHER): Payer: Commercial Managed Care - PPO | Admitting: Family

## 2016-03-25 ENCOUNTER — Ambulatory Visit (HOSPITAL_BASED_OUTPATIENT_CLINIC_OR_DEPARTMENT_OTHER)
Admission: RE | Admit: 2016-03-25 | Discharge: 2016-03-25 | Disposition: A | Discharge status: Home / Self Care | Payer: Commercial Managed Care - PPO | Source: Ambulatory Visit | Attending: Family | Admitting: Family

## 2016-03-25 VITALS — BP 126/84 | HR 86 | Temp 98.0°F | Resp 16 | Ht 62.0 in | Wt 289.8 lb

## 2016-03-25 DIAGNOSIS — R079 Chest pain, unspecified: Secondary | ICD-10-CM | POA: Diagnosis not present

## 2016-03-25 DIAGNOSIS — R51 Headache: Secondary | ICD-10-CM | POA: Diagnosis not present

## 2016-03-25 DIAGNOSIS — R413 Other amnesia: Secondary | ICD-10-CM

## 2016-03-25 DIAGNOSIS — I10 Essential (primary) hypertension: Secondary | ICD-10-CM | POA: Diagnosis not present

## 2016-03-25 DIAGNOSIS — R519 Headache, unspecified: Secondary | ICD-10-CM

## 2016-03-25 DIAGNOSIS — G43909 Migraine, unspecified, not intractable, without status migrainosus: Secondary | ICD-10-CM

## 2016-03-25 NOTE — Progress Notes (Signed)
Subjective:    Patient ID: Sharon Rivers, female    DOB: 06-Jan-1971, 46 y.o.   MRN: [id number]  HPI  Sharon Rivers is a 46 yr old female who presents today for ER follow up.  ER record is reviewed.  Pt presented to the ED on 03/22/16 with chief complaint of chest pain. She also reported "lightheadedness" at that time.  She had normal troponin, normal d dimer.    We last saw her on 2/5 with chief complaint of migraine.    Patient reports that when she walks her head gets "more woozy."  Notes some blurring of her vision.  Has a dull headache. Patient reports some mild nausea the other night.  None now.  She reports that the imitrex "eases it off" . Reports that she has jumbling of words in her mind .   Notes that she sometimes will find herself in  Denies seizures, tremors, bowel incontinence.  Notes stress incontinence.  She denies LOC.   She reports that claritin and flonase is helping her.  The last day that she worked was Tuesday.    Review of Systems    see HPI  Past Medical History:  Diagnosis Date  . Anxiety   . Arthritis   . Back pain   . Chest pain   . Constipation   . Depression   . Edema    feet and legs  . Frequent headaches   . GERD (gastroesophageal reflux disease)   . Hypertension   . Hypothyroidism   . Joint pain   . Migraines   . OSA (obstructive sleep apnea) 03/17/2016  . Osteoarthritis   . Panic anxiety syndrome   . SOB (shortness of breath)   . Swallowing difficulty   . Thyroid disease      Social History   Social History  . Marital status: Single    Spouse name: N/A  . Number of children: N/A  . Years of education: N/A   Occupational History  . Assembler     [employer]   Social History Main Topics  . Smoking status: Former Research scientist (life sciences)  . Smokeless tobacco: Never Used     Comment: Quit >10 years ago  . Alcohol use No  . Drug use: No  . Sexual activity: Yes    Partners: Female    Birth control/ protection: Pill     Comment: OCP   Other  Topics Concern  . Not on file   Social History Narrative  . No narrative on file    Past Surgical History:  Procedure Laterality Date  . CESAREAN SECTION  2001 & 2002  . ELBOW SURGERY    . MINOR CARPAL TUNNEL     pinched nerve in elbow  . WISDOM TOOTH EXTRACTION      Family History  Problem Relation Age of Onset  . Hypertension Mother     Living  . Arthritis Mother   . Thyroid disease Mother   . Heart disease Mother   . Heart attack Mother   . Migraines Mother   . Depression Mother   . Obesity Mother   . Hypertension Father     Stepfather  . Hypertension Maternal Grandmother   . Parkinson's disease Maternal Grandmother   . Alzheimer's disease Maternal Grandmother   . Cancer Maternal Grandfather   . Hypertension Maternal Grandfather   . Hypertension Paternal Grandmother   . Hypertension Paternal Grandfather   . Gout Brother   . ADD / ADHD Son  x1  . Other Son     #2-Unknown  . Diabetes Neg Hx     Allergies  Allergen Reactions  . Adhesive [Tape] Other (See Comments)    Skin Burn.  [name] [Menthol (Topical Analgesic)] Other (See Comments)    Skin Burn.  . Sulfa Antibiotics Rash    Current Outpatient Prescriptions on File Prior to Visit  Medication Sig Dispense Refill  . amitriptyline (ELAVIL) 25 MG tablet Take 1 tablet (25 mg total) by mouth at bedtime. 30 tablet 1  . amLODipine (NORVASC) 5 MG tablet Take 1 tablet (5 mg total) by mouth daily. 30 tablet 3  . fluticasone (FLONASE) 50 MCG/ACT nasal spray Place 2 sprays into both nostrils daily. 16 g 0  . gabapentin (NEURONTIN) 300 MG capsule Take 300 mg by mouth 3 (three) times daily.    . hydrochlorothiazide (HYDRODIURIL) 25 MG tablet Take 1 tablet (25 mg total) by mouth daily. 30 tablet 1  . levothyroxine (SYNTHROID) 175 MCG tablet Take 1 tablet (175 mcg total) by mouth daily before breakfast. 30 tablet 1  . lisinopril (PRINIVIL,ZESTRIL) 20 MG tablet Take 1 tablet (20 mg total) by mouth daily. 30  tablet 1  . loratadine (CLARITIN) 10 MG tablet Take 1 tablet (10 mg total) by mouth daily. 30 tablet 11  . metFORMIN (GLUCOPHAGE) 500 MG tablet Take 1 tablet (500 mg total) by mouth daily with breakfast. 30 tablet 0  . norethindrone (JOLIVETTE) 0.35 MG tablet Take 1 tablet (0.35 mg total) by mouth daily. 1 Package 12  . ondansetron (ZOFRAN) 4 MG tablet Take 1 tablet (4 mg total) by mouth every 8 (eight) hours as needed for nausea or vomiting. 20 tablet 0  . pantoprazole (PROTONIX) 40 MG tablet Take 1 tablet (40 mg total) by mouth daily. 30 tablet 3  . SUMAtriptan (IMITREX) 50 MG tablet Take 1 tablet (50 mg total) by mouth once. May repeat in 2 hours if headache persists or recurs. 10 tablet 0  . venlafaxine XR (EFFEXOR XR) 37.5 MG 24 hr capsule 1 tab by mouth once daily for 3 days, then increase to 2 tabs once daily on day 4 60 capsule 1  . Vitamin D, Ergocalciferol, (DRISDOL) 50000 units CAPS capsule Take 1 capsule (50,000 Units total) by mouth every 7 (seven) days. 4 capsule 0   No current facility-administered medications on file prior to visit.     BP 126/84 (BP Location: Left Arm) Comment (Cuff Size): thigh  Pulse 86   Temp 98 F (36.7 C) (Oral)   Resp 16   Ht 5\' 2"  (1.575 m)   Wt 289 lb 12.8 oz (131.5 kg)   LMP 03/06/2016 (Exact Date)   SpO2 100% Comment: room air  BMI 53.01 kg/m    Objective:   Physical Exam  Constitutional: She is oriented to person, place, and time. She appears well-developed and well-nourished.  HENT:  Head: Normocephalic.  Cardiovascular: Normal rate, regular rhythm and normal heart sounds.   No murmur heard. Pulmonary/Chest: Effort normal and breath sounds normal. No respiratory distress. She has no wheezes.  Musculoskeletal: She exhibits no edema.  Neurological: She is alert and oriented to person, place, and time.  Psychiatric: She has a normal mood and affect. Her behavior is normal. Judgment and thought content normal.          Assessment &  Plan:  Atypical chest pain- will refer to cardiology for further evaluation.  She is instructed to return to the ER if she develops  recurrent chest pain. EKG tracing is personally reviewed.  EKG notes NSR.  No acute changes.    HTN- will d/c hctz due to c/o feeling dizzy when she bends over. Orthostatics are performed today and she is not not significantly orthostatic.    Migraine- she has apt scheduled with neurology.  Advised pt to keep this appointment. I also advised her to schedule an eye exam to see if she may need glasses.  I will check a CT of the head to rule out CVA.   She is asking to be written out of work on medical leave.  At this time I feel that she is still medically able to work.

## 2016-03-25 NOTE — Patient Instructions (Signed)
Please keep your upcoming appointment with neurology.  You will be contacted about your referral to cardiology. Please return to the ER if you develop recurrent chest pain.

## 2016-03-25 NOTE — Telephone Encounter (Signed)
Gave pt work note for this past week. Advised pt to look into FMLA for intermittent leave while continuing work up for current symptoms. Pt states she has not been at work long enough to be eligible for [facility] but was told that she could be out for medical leave. Pt states she is not driving because of her dizziness and "wavy vision" and was hoping she could get medical leave until symptoms subside. Just wanted to check and see if you have any other recommendation?

## 2016-03-25 NOTE — Telephone Encounter (Signed)
Notified pt and she voices understanding. She states she is going to schedule an appt with [facility] her self and will have them forward their findings to [name].

## 2016-03-25 NOTE — Telephone Encounter (Signed)
I would recommend eye exam and d/c hctz as we discussed at her visit. I think that it may be some time time before we get a good answer and since she has not had an Loss of consciousness and her orthostatics, CT head and neuro exam are unremarkable, I think it is ok for her to continue to work.

## 2016-03-25 NOTE — Progress Notes (Signed)
Pre visit review using our clinic review tool, if applicable. No additional management support is needed unless otherwise documented below in the visit note. 

## 2016-03-26 ENCOUNTER — Ambulatory Visit (HOSPITAL_BASED_OUTPATIENT_CLINIC_OR_DEPARTMENT_OTHER)
Admission: RE | Admit: 2016-03-26 | Discharge: 2016-03-26 | Disposition: A | Discharge status: Home / Self Care | Payer: Commercial Managed Care - PPO | Source: Ambulatory Visit | Attending: Family | Admitting: Family

## 2016-03-26 ENCOUNTER — Encounter (HOSPITAL_BASED_OUTPATIENT_CLINIC_OR_DEPARTMENT_OTHER): Payer: Self-pay

## 2016-03-26 DIAGNOSIS — Z1231 Encounter for screening mammogram for malignant neoplasm of breast: Secondary | ICD-10-CM | POA: Diagnosis not present

## 2016-03-26 DIAGNOSIS — Z0000 Encounter for general adult medical examination without abnormal findings: Secondary | ICD-10-CM

## 2016-03-29 ENCOUNTER — Encounter: Payer: Self-pay | Admitting: Family

## 2016-03-30 ENCOUNTER — Ambulatory Visit (INDEPENDENT_AMBULATORY_CARE_PROVIDER_SITE_OTHER): Payer: BLUE CROSS/BLUE SHIELD | Admitting: Family Medicine

## 2016-03-30 ENCOUNTER — Encounter: Payer: Self-pay | Admitting: Family Medicine

## 2016-03-30 VITALS — BP 132/88 | HR 91 | Temp 99.0°F | Ht 62.75 in | Wt 288.4 lb

## 2016-03-30 DIAGNOSIS — G4733 Obstructive sleep apnea (adult) (pediatric): Secondary | ICD-10-CM

## 2016-03-30 DIAGNOSIS — I10 Essential (primary) hypertension: Secondary | ICD-10-CM | POA: Diagnosis not present

## 2016-03-30 DIAGNOSIS — R400 Somnolence: Secondary | ICD-10-CM

## 2016-03-30 DIAGNOSIS — R079 Chest pain, unspecified: Secondary | ICD-10-CM

## 2016-03-30 NOTE — Progress Notes (Signed)
[place] [facility] [address] [place], [place] [address] [id number] [phone]  Date:  03/30/2016   Name:  Sharon Rivers   DOB:  26-Mar-1970   MRN:  [id number]  PCP:  [name]., NP    Chief Complaint: Loss of Consciousness (Per patient. Lightheadedness past couple weeks. )   History of Present Illness:  Sharon Rivers is a 46 y.o. very pleasant female patient who presents with the following:  See in the ER on 2/13 with chest pain and lightheadedness.  Had a CP rule out in the ER and negative D dimer, and then saw Melissa on 2/16 She was referred to cardiology.  We also stopped her HCTZ due to feeling dizzy at her last visit here  Here today with concern about possible fainting episode She states that 2 days ago while home alone she was watching TV in a chair and "hours later I came to on my couch."  She was not lying on the ground and does not remember passing out, but does not remember how she got from the chair to the couch. She is not aware of any injury or fall.  No one else was home and she is not sure what may have happened.   She otherwise felt fine when she woke up apart from being sleepy  Her appt with cardiology is tomorrow.   She states that otherwise she is still having some dizziness and feels tired.  "I just want to sleep."  She is not having any CP like what took her to the ER She had a low risk cardiac stress done 6/17  She does have a vitamin D def which is now being treated.   Possible cause of her episode may be somnolence due to OSA She has an in lab sleep study scheduled for 3/4, she did do a home test already which was positive for OSA.  They have not given her a CPAP as of yet but she should be able to get this soon She had a negative contrasted CT of her head on 2/16 but has not had an MRI as of yet She is also scheduled to see neurology in April  LMP 1/28 BP Readings from Last 3 Encounters:   03/30/16 132/88  03/25/16 126/84  03/24/16 110/77    Patient Active Problem List   Diagnosis Date Noted  . OSA (obstructive sleep apnea) 03/17/2016  . Insomnia 02/23/2016  . GERD (gastroesophageal reflux disease) 02/23/2016  . Migraines 02/23/2016  . Depression 02/23/2016  . Hypertension 07/22/2015  . Lateral epicondylitis 07/22/2015  . Thyroid activity decreased 07/22/2015  . Family history of early CAD 07/22/2015  . Carpal tunnel syndrome 04/30/2015  . Morbid obesity ([place]) 09/12/2013    Past Medical History:  Diagnosis Date  . Anxiety   . Arthritis   . Back pain   . Chest pain   . Constipation   . Depression   . Edema    feet and legs  . Frequent headaches   . GERD (gastroesophageal reflux disease)   . Hypertension   . Hypothyroidism   . Joint pain   . Migraines   . OSA (obstructive sleep apnea) 03/17/2016  . Osteoarthritis   . Panic anxiety syndrome   . SOB (shortness of breath)   . Swallowing difficulty   . Thyroid disease     Past Surgical History:  Procedure Laterality Date  . CESAREAN SECTION  2001 & 2002  .  ELBOW SURGERY    . MINOR CARPAL TUNNEL     pinched nerve in elbow  . WISDOM TOOTH EXTRACTION      Social History  Substance Use Topics  . Smoking status: Former Research scientist (life sciences)  . Smokeless tobacco: Never Used     Comment: Quit >10 years ago  . Alcohol use No    Family History  Problem Relation Age of Onset  . Hypertension Mother     Living  . Arthritis Mother   . Thyroid disease Mother   . Heart disease Mother     MI at age 33  . Heart attack Mother   . Migraines Mother   . Depression Mother   . Obesity Mother   . Hypertension Maternal Grandmother   . Parkinson's disease Maternal Grandmother   . Alzheimer's disease Maternal Grandmother   . Cancer Maternal Grandfather   . Hypertension Maternal Grandfather   . Hypertension Paternal Grandmother   . Hypertension Paternal Grandfather   . Gout Brother   . ADD / ADHD Son     x1  . Other  Son     #2-Unknown  . Diabetes Neg Hx     Allergies  Allergen Reactions  . Adhesive [Tape] Other (See Comments)    Skin Burn.  [name] [Menthol (Topical Analgesic)] Other (See Comments)    Skin Burn.  . Sulfa Antibiotics Rash    Medication list has been reviewed and updated.  Current Outpatient Prescriptions on File Prior to Visit  Medication Sig Dispense Refill  . amitriptyline (ELAVIL) 25 MG tablet Take 1 tablet (25 mg total) by mouth at bedtime. 30 tablet 1  . amLODipine (NORVASC) 5 MG tablet Take 1 tablet (5 mg total) by mouth daily. 30 tablet 3  . fluticasone (FLONASE) 50 MCG/ACT nasal spray Place 2 sprays into both nostrils daily. 16 g 0  . gabapentin (NEURONTIN) 300 MG capsule Take 300 mg by mouth 3 (three) times daily.    [name] levothyroxine (SYNTHROID) 175 MCG tablet Take 1 tablet (175 mcg total) by mouth daily before breakfast. 30 tablet 1  . lisinopril (PRINIVIL,ZESTRIL) 20 MG tablet Take 1 tablet (20 mg total) by mouth daily. 30 tablet 1  . loratadine (CLARITIN) 10 MG tablet Take 1 tablet (10 mg total) by mouth daily. 30 tablet 11  . metFORMIN (GLUCOPHAGE) 500 MG tablet Take 1 tablet (500 mg total) by mouth daily with breakfast. 30 tablet 0  . norethindrone (JOLIVETTE) 0.35 MG tablet Take 1 tablet (0.35 mg total) by mouth daily. 1 Package 12  . ondansetron (ZOFRAN) 4 MG tablet Take 1 tablet (4 mg total) by mouth every 8 (eight) hours as needed for nausea or vomiting. 20 tablet 0  . pantoprazole (PROTONIX) 40 MG tablet Take 1 tablet (40 mg total) by mouth daily. 30 tablet 3  . SUMAtriptan (IMITREX) 50 MG tablet Take 1 tablet (50 mg total) by mouth once. May repeat in 2 hours if headache persists or recurs. 10 tablet 0  . venlafaxine XR (EFFEXOR XR) 37.5 MG 24 hr capsule 1 tab by mouth once daily for 3 days, then increase to 2 tabs once daily on day 4 60 capsule 1  . Vitamin D, Ergocalciferol, (DRISDOL) 50000 units CAPS capsule Take 1 capsule (50,000 Units total) by mouth  every 7 (seven) days. 4 capsule 0   No current facility-administered medications on file prior to visit.     Review of Systems: No fever or chills, no nausea, vomiting or diarrhea, no rash  As per HPI- otherwise negative. She takes elavil and neurontin at bedtime but had not taken anything sedating prior to her episode 2 days ago  She notes that she will have urinary urgency but this is baseline since she had children She does have somnolence for the last week or so- maybe longer.  She has started snoring "really bad" per her BF  Physical Examination: Vitals:   03/30/16 1546  BP: 132/88  Pulse: 91  Temp: 99 F (37.2 C)   Vitals:   03/30/16 1546  Weight: 288 lb 6.4 oz (130.8 kg)  Height: 5' 2.75" (1.594 m)   Body mass index is 51.5 kg/m. Ideal Body Weight: Weight in (lb) to have BMI = 25: 139.7  GEN: WDWN, NAD, Non-toxic, A & O x 3, obese, appears older than stated age  81: Atraumatic, Normocephalic. Neck supple. No masses, No LAD. Bilateral TM wnl, oropharynx normal.  PEERL,EOMI.   Ears and Nose: No external deformity. CV: RRR, No M/G/R. No JVD. No thrill. No extra heart sounds. PULM: CTA B, no wheezes, crackles, rhonchi. No retractions. No resp. distress. No accessory muscle use. ABD: S, NT, ND, +BS. No rebound. No HSM.  Benign belly EXTR: No c/c/e NEURO Normal gait.   Moves all extremities normally, normal speech, no facial drooping PSYCH: Normally interactive. Conversant. Not depressed or anxious appearing.  Calm demeanor.    Assessment and Plan: Daytime somnolence  Essential hypertension  Morbid obesity (HCC)  OSA (obstructive sleep apnea)  Chest pain, unspecified type  Here today to follow-up. She is concerned about an episode 2 days ago when she was watching TV in a chair and then woke up from sleep on her couch, unsure of how she moved to the couch.  I suspect that she likely got very tired and moved to the couch without remembering it, perhaps related to  her sleep apnea. At this time she has no acute symptoms and is already set up to see cardiology (tomorrow), for a formal sleep study and to see neurology Asked her to be aware of somnolence and avoid driving if she feels sleepy- take a nap first.  Otherwise she will keep her planned follow- up appts and alert me if she has any other concerning symptoms.   Signed Lamar Blinks, MD

## 2016-03-30 NOTE — Telephone Encounter (Signed)
Pt read above mychart message at 1:35pm. Attempted to reach pt to confirm that she would come in to appt as scheduled at 3:45pm but got pt's voicemail.

## 2016-03-30 NOTE — Telephone Encounter (Signed)
Attempted to reach pt for additional information and left message for pt to return my call. Per verbal from PCP, she needs office evaluation today and should not drive herself. If pt unable to come in today she needs to go to the ER for evaluation today. Scheduled pt for 3:45 today with Dr Lorelei Pont and left message for pt to return my call. Also sent email to pt regarding appt today.

## 2016-03-30 NOTE — Patient Instructions (Signed)
Be sure to avoid driving if you are feeling sleepy.   I think that you have all the appropriate follow-up in place.  Let me know if you need anything in the meantime

## 2016-03-30 NOTE — Telephone Encounter (Signed)
See additional patient email from 03/29/16.

## 2016-03-30 NOTE — Progress Notes (Signed)
Pre visit review using our clinic tool,if applicable. No additional management support is needed unless otherwise documented below in the visit note.  

## 2016-03-31 ENCOUNTER — Ambulatory Visit (INDEPENDENT_AMBULATORY_CARE_PROVIDER_SITE_OTHER): Payer: Commercial Managed Care - PPO | Admitting: Nurse Practitioner

## 2016-03-31 ENCOUNTER — Encounter: Payer: Self-pay | Admitting: Nurse Practitioner

## 2016-03-31 VITALS — BP 138/84 | HR 73 | Ht 62.5 in | Wt 290.8 lb

## 2016-03-31 DIAGNOSIS — R072 Precordial pain: Secondary | ICD-10-CM

## 2016-03-31 DIAGNOSIS — I10 Essential (primary) hypertension: Secondary | ICD-10-CM

## 2016-03-31 DIAGNOSIS — R400 Somnolence: Secondary | ICD-10-CM | POA: Diagnosis not present

## 2016-03-31 NOTE — Progress Notes (Signed)
Cardiology Clinic Note   Patient Name: Sharon Rivers Date of Encounter: 03/31/2016  Primary Care Provider:  [name]., NP Primary Cardiologist:  New - will f/u with Corky Downs, MD   Patient Profile    46 year old female with a prior history of hypertension, hypothyroidism, GERD, obesity, anxiety, depression, and presumed sleep apnea, who presents for evaluation of chest pain and dyspnea on exertion.  Past Medical History    Past Medical History:  Diagnosis Date  . Anxiety   . Back pain   . Chest pain    a. 07/2015 Myoview: EF 71%, medium size, mild intensity, partially reversible septal defect with overlying breast attenuation -> likely artifact. No significant reversible ischemia.  . Constipation   . Depression   . Edema    feet and legs  . Frequent headaches   . GERD (gastroesophageal reflux disease)   . Hypertension   . Hypothyroidism   . Joint pain   . Migraines   . OSA (obstructive sleep apnea) 03/17/2016  . Osteoarthritis   . Panic anxiety syndrome   . SOB (shortness of breath)   . Swallowing difficulty    Past Surgical History:  Procedure Laterality Date  . CESAREAN SECTION  2001 & 2002  . ELBOW SURGERY    . MINOR CARPAL TUNNEL     pinched nerve in elbow  . WISDOM TOOTH EXTRACTION      Allergies  Allergies  Allergen Reactions  . Adhesive [Tape] Other (See Comments)    Skin Burn.  [name] [Menthol (Topical Analgesic)] Other (See Comments)    Skin Burn.  . Sulfa Antibiotics Rash    History of Present Illness    46 year old female with the above past medical history including morbid obesity, presumed sleep apnea, osteoarthritis, arthritis, migraines, anxiety, depression, GERD, and hypothyroidism. She previously smoked in high school but hasn't smoked since 1992. She rarely uses alcohol. She does not have a significant history of premature CAD, though it is notable that her mother had a heart attack at age 45. She is unaware of her father's  medical history.  In the late spring of 2017, she had an episode of chest and left arm pain and was subsequently evaluated with stress testing. This was deemed to be low risk with attenuation artifact and normal LV function. She says that she did reasonably well following that episode. 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 This involves a fair amount of exertion. She has been seen by weight loss medicine and does hope to begin exercising more regularly.  She was in her usual state of health until February 13, when she developed band like discomfort around her chest, will driving to work. This persisted for several hours at work. She also noted some lightheadedness. She called her boyfriend who picked her up and brought to the emergency department. There, ECG was normal. Troponin, d-dimer, and electrolytes were also normal. Chest x-ray was nonacute. She was subsequently discharged home. Since then, she has been more aware of dyspnea on exertion occurring at work. She has also noted brief episodes of exertional chest discomfort that resolved within a few seconds to minutes. She denies PND, orthopnea, dizziness, edema, palpitations, or early satiety. She does describe an episode where she was sitting on her chair watching TV and then she awoke 4-5 hours later, lying on her couch. She doesn't know how she got from her chair to her couch. She does not recall any prodromal  symptoms or sequelae. She has no prior history of syncope.   Home Medications    Prior to Admission medications   Medication Sig Start Date End Date Taking? Authorizing Provider  amitriptyline (ELAVIL) 25 MG tablet Take 1 tablet (25 mg total) by mouth at bedtime. 03/16/16  Yes [name], NP  amLODipine (NORVASC) 5 MG tablet Take 1 tablet (5 mg total) by mouth daily. 02/23/16  Yes [name], NP  fluticasone (FLONASE) 50 MCG/ACT nasal spray Place 2 sprays into both  nostrils daily. 03/14/16  Yes [name], NP  gabapentin (NEURONTIN) 300 MG capsule Take 300 mg by mouth 3 (three) times daily.   Yes Historical Provider, MD  levothyroxine (SYNTHROID) 175 MCG tablet Take 1 tablet (175 mcg total) by mouth daily before breakfast. 02/25/16  Yes [name], NP  lisinopril (PRINIVIL,ZESTRIL) 20 MG tablet Take 1 tablet (20 mg total) by mouth daily. 07/22/15  Yes Brunetta Jeans, PA-C  loratadine (CLARITIN) 10 MG tablet Take 1 tablet (10 mg total) by mouth daily. 03/14/16  Yes [name], NP  metFORMIN (GLUCOPHAGE) 500 MG tablet Take 1 tablet (500 mg total) by mouth daily with breakfast. 03/24/16  Yes Caren D Leafy Ro, MD  norethindrone (JOLIVETTE) 0.35 MG tablet Take 1 tablet (0.35 mg total) by mouth daily. 07/27/15  Yes Brunetta Jeans, PA-C  ondansetron (ZOFRAN) 4 MG tablet Take 1 tablet (4 mg total) by mouth every 8 (eight) hours as needed for nausea or vomiting. 08/21/15  Yes Brunetta Jeans, PA-C  pantoprazole (PROTONIX) 40 MG tablet Take 1 tablet (40 mg total) by mouth daily. 02/23/16  Yes [name], NP  SUMAtriptan (IMITREX) 50 MG tablet Take 1 tablet (50 mg total) by mouth once. May repeat in 2 hours if headache persists or recurs. 08/21/15  Yes Brunetta Jeans, PA-C  venlafaxine XR (EFFEXOR XR) 37.5 MG 24 hr capsule 1 tab by mouth once daily for 3 days, then increase to 2 tabs once daily on day 4 02/23/16  Yes [name], NP  Vitamin D, Ergocalciferol, (DRISDOL) 50000 units CAPS capsule Take 1 capsule (50,000 Units total) by mouth every 7 (seven) days. 03/24/16  Yes Caren Drucie Opitz, MD    Family History    Family History  Problem Relation Age of Onset  . Hypertension Mother     Living  . Arthritis Mother   . Thyroid disease Mother   . Heart disease Mother     MI at age 42  . Heart attack Mother   . Migraines Mother   . Depression Mother   . Obesity Mother   . Hypertension Maternal Grandmother   . Parkinson's disease  Maternal Grandmother   . Alzheimer's disease Maternal Grandmother   . Cancer Maternal Grandfather   . Hypertension Maternal Grandfather   . Hypertension Paternal Grandmother   . Hypertension Paternal Grandfather   . Gout Brother   . ADD / ADHD Son     x1  . Other Son     #2-Unknown  . Diabetes Neg Hx     Social History    Social History   Social History  . Marital status: Single    Spouse name: N/A  . Number of children: N/A  . Years of education: N/A   Occupational History  . Assembler     [employer]   Social History Main Topics  . Smoking status: Former Research scientist (life sciences)  . Smokeless tobacco: Never Used     Comment: quit 1992  . Alcohol  use No  . Drug use: No  . Sexual activity: Yes    Partners: Female    Birth control/ protection: Pill     Comment: OCP   Other Topics Concern  . Not on file   Social History Narrative   She has 2 children (grown). She did not retain custody of these children.   Lives with boyfriend in [place].   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      Review of Systems    General:  No chills, fever, night sweats or weight changes.  Cardiovascular:  +++ chest pain, +++ dyspnea on exertion, no edema, orthopnea, palpitations, paroxysmal nocturnal dyspnea. Dermatological: No rash, lesions/masses Respiratory: No cough, +++ dyspnea Urologic: No hematuria, dysuria Abdominal:   No nausea, vomiting, diarrhea, bright red blood per rectum, melena, or hematemesis Neurologic:  No visual changes, wkns, changes in mental status. All other systems reviewed and are otherwise negative except as noted above.  Physical Exam    VS:  BP 138/84   Pulse 73   Ht 5' 2.5" (1.588 m)   Wt 290 lb 12.8 oz (131.9 kg)   LMP 03/06/2016   BMI 52.34 kg/m  , BMI Body mass index is 52.34 kg/m. GEN: morbidly obese, in no acute distress.  HEENT: normal.  Neck: Supple, obese, difficult to gauge JVP, no carotid bruits, or masses. Cardiac: RRR, 2/6 SEM LUSB, no rubs, or  gallops. No clubbing, cyanosis, edema.  Radials/DP/PT 2+ and equal bilaterally.  Respiratory:  Respirations regular and unlabored, clear to auscultation bilaterally. GI: Soft, obese, nontender, nondistended, BS + x 4. MS: no deformity or atrophy. Skin: warm and dry, no rash. Neuro:  Strength and sensation are intact. Psych: Normal affect.  Accessory Clinical Findings    ECG - Regular sinus rhythm, 73, no acute ST or T changes.   Assessment & Plan   1.  Precordial chest pain: Patient has prior history of chest pain dating back to June 2017, at which time she underwent stress testing, which was low risk with breast attenuation noted. EF was normal. She had recurrent chest pain on February 13, which occurred at rest and persisted for 3-4 hours. She was seen in the emergency department. Despite prolonged symptoms, ECG and troponin were normal. Since that episode, she notes intermittent somewhat fleeting rest and exertional chest discomfort. She has also had an increase in dyspnea on exertion. We discussed options for further evaluation. I do not think there is value to repeat stress testing at this time. We did discuss cardiac CT angiography, however given her size, she would not be a suitable candidate. We also discussed diagnostic catheterization, however collectively agreed that given prior nonischemic stress test and recent lack of objective evidence of ischemia despite prolonged chest pain, that a more conservative approach would be appropriate. As a result, in the setting of dyspnea and a soft systolic murmur, we'll arrange for 2-D echocardiogram. I will plan to see her back in 2-3 weeks to discuss results and symptoms. Provided that echo is normal and symptoms are stable, I would not pursue further invasive evaluation. If however symptoms worsen or LV function is noted to be reduced, we will need to reconsider diagnostic catheterization.   2. Essential hypertension: Blood pressure is mildly  elevated at 134/84 today. She is on amlodipine 5 mg and lisinopril 20 mg. She is in the process of working on lifestyle changes and weight loss. I will not titrate her medications at all.   3. Hypothyroidism: TSH was  recently mildly suppressed at 0.4-8. T3 and free T4 were normal. She is on levothyroxine and followed by primary care.   4. Probable sleep apnea: History of snoring. She has a sleep study scheduled for early March.   5. Daytime somnolence: Sleep study for early March as above. She recently had an episode where she was sitting in her chair watching TV and subsequently woke up several hours later on her couch. She has no recollection of how she got from her chair to her couch. This does not sound to be syncope or arrhythmia related. She most likely fell asleep. I would not pursue monitoring at this time.   6. Morbid obesity: Patient is now being followed in the healthy weight and wellness clinic. She is interested in losing weight and does want to begin an exercise regimen.   7. Disposition: Follow-up echocardiogram. Follow up in clinic in 2-3 weeks.  Murray Hodgkins, NP 03/31/2016, 9:58 AM

## 2016-03-31 NOTE — Patient Instructions (Signed)
Medication Instructions:   NO CHANGE  Testing/Procedures:  Your physician has requested that you have an echocardiogram. Echocardiography is a painless test that uses sound waves to create images of your heart. It provides your doctor with information about the size and shape of your heart and how well your heart's chambers and valves are working. This procedure takes approximately one hour. There are no restrictions for this procedure.    Follow-Up:  Your physician recommends that you schedule a follow-up appointment in: [place] NP

## 2016-04-05 ENCOUNTER — Encounter (INDEPENDENT_AMBULATORY_CARE_PROVIDER_SITE_OTHER): Payer: Self-pay | Admitting: Family Medicine

## 2016-04-10 ENCOUNTER — Ambulatory Visit (HOSPITAL_BASED_OUTPATIENT_CLINIC_OR_DEPARTMENT_OTHER): Payer: BLUE CROSS/BLUE SHIELD | Attending: Family | Admitting: Pulmonary Disease

## 2016-04-10 VITALS — Ht 62.5 in | Wt 290.0 lb

## 2016-04-10 DIAGNOSIS — G4733 Obstructive sleep apnea (adult) (pediatric): Secondary | ICD-10-CM | POA: Diagnosis present

## 2016-04-12 ENCOUNTER — Ambulatory Visit (INDEPENDENT_AMBULATORY_CARE_PROVIDER_SITE_OTHER): Payer: BLUE CROSS/BLUE SHIELD | Admitting: Family Medicine

## 2016-04-12 VITALS — BP 122/83 | HR 89 | Temp 97.8°F | Resp 16 | Ht 62.5 in | Wt 284.0 lb

## 2016-04-12 DIAGNOSIS — E559 Vitamin D deficiency, unspecified: Secondary | ICD-10-CM

## 2016-04-12 DIAGNOSIS — R7303 Prediabetes: Secondary | ICD-10-CM | POA: Diagnosis not present

## 2016-04-12 DIAGNOSIS — Z9189 Other specified personal risk factors, not elsewhere classified: Secondary | ICD-10-CM | POA: Diagnosis not present

## 2016-04-12 MED ORDER — METFORMIN HCL 500 MG PO TABS: 500.0000 mg | ORAL_TABLET | Freq: Every day | ORAL | 0 refills | Status: DC

## 2016-04-12 MED ORDER — VITAMIN D (ERGOCALCIFEROL) 1.25 MG (50000 UNIT) PO CAPS: 50000.0000 [IU] | ORAL_CAPSULE | ORAL | 0 refills | Status: DC

## 2016-04-12 NOTE — Progress Notes (Signed)
Office: [phone]  /  Fax: [phone]   HPI:   Chief Complaint: OBESITY Sharon Rivers is here to discuss her progress with her obesity treatment plan. She is following her eating plan approximately 95 % of the time and states she is exercising 0 minutes 0 times per week. Sharon Rivers is doing well with weight loss. She is struggling to eat all th food on her plan. She is bored with lunch and would like more options.  Her weight is 284 lb (128.8 kg) today and has had a weight loss of 2 pounds over a period of 2 to 3 weeks since her last visit. She has lost 0 lbs since starting treatment with [name].  Vitamin D deficiency Sharon Rivers has a diagnosis of vitamin D deficiency and is not yet at goal. She is currently taking vit D and denies nausea, vomiting or muscle weakness.  Pre-Diabetes Sharon Rivers has a diagnosis of prediabetes based on her elevated Hgb A1c and was informed this puts her at greater risk of developing diabetes. She is taking metformin currently and is stable on Metformin. She is doing well with diet and is ready to start exercise to decrease risk of diabetes. She denies nausea or hypoglycemia.  At risk for diabetes Sharon Rivers is at higher than average risk for developing diabetes due to her obesity. She currently denies polyuria or polydipsia.   Wt Readings from Last 500 Encounters:  04/12/16 284 lb (128.8 kg)  04/10/16 290 lb (131.5 kg)  03/31/16 290 lb 12.8 oz (131.9 kg)  03/30/16 288 lb 6.4 oz (130.8 kg)  03/25/16 289 lb 12.8 oz (131.5 kg)  03/24/16 286 lb (129.7 kg)  03/22/16 285 lb (129.3 kg)  03/14/16 286 lb (129.7 kg)  03/10/16 280 lb (127 kg)  02/23/16 295 lb 6.4 oz (134 kg)  11/12/15 282 lb (127.9 kg)  10/16/15 280 lb 8 oz (127.2 kg)  08/14/15 279 lb 6 oz (126.7 kg)  08/05/15 282 lb (127.9 kg)  07/29/15 274 lb 8 oz (124.5 kg)  07/22/15 282 lb 4 oz (128 kg)  07/14/15 279 lb (126.6 kg)  07/07/15 279 lb 11.2 oz (126.9 kg)     ALLERGIES: Allergies  Allergen Reactions  .  Adhesive [Tape] Other (See Comments)    Skin Burn.  [name] [Menthol (Topical Analgesic)] Other (See Comments)    Skin Burn.  . Sulfa Antibiotics Rash    MEDICATIONS: Current Outpatient Prescriptions on File Prior to Visit  Medication Sig Dispense Refill  . amitriptyline (ELAVIL) 25 MG tablet Take 1 tablet (25 mg total) by mouth at bedtime. 30 tablet 1  . amLODipine (NORVASC) 5 MG tablet Take 1 tablet (5 mg total) by mouth daily. 30 tablet 3  . fluticasone (FLONASE) 50 MCG/ACT nasal spray Place 2 sprays into both nostrils daily. 16 g 0  . gabapentin (NEURONTIN) 300 MG capsule Take 300 mg by mouth 3 (three) times daily.    [name] levothyroxine (SYNTHROID) 175 MCG tablet Take 1 tablet (175 mcg total) by mouth daily before breakfast. 30 tablet 1  . lisinopril (PRINIVIL,ZESTRIL) 20 MG tablet Take 1 tablet (20 mg total) by mouth daily. 30 tablet 1  . loratadine (CLARITIN) 10 MG tablet Take 1 tablet (10 mg total) by mouth daily. 30 tablet 11  . norethindrone (JOLIVETTE) 0.35 MG tablet Take 1 tablet (0.35 mg total) by mouth daily. 1 Package 12  . ondansetron (ZOFRAN) 4 MG tablet Take 1 tablet (4 mg total) by mouth every 8 (eight) hours as needed for  nausea or vomiting. 20 tablet 0  . pantoprazole (PROTONIX) 40 MG tablet Take 1 tablet (40 mg total) by mouth daily. 30 tablet 3  . SUMAtriptan (IMITREX) 50 MG tablet Take 1 tablet (50 mg total) by mouth once. Sharon Rivers repeat in 2 hours if headache persists or recurs. 10 tablet 0  . venlafaxine XR (EFFEXOR XR) 37.5 MG 24 hr capsule 1 tab by mouth once daily for 3 days, then increase to 2 tabs once daily on day 4 60 capsule 1   No current facility-administered medications on file prior to visit.     PAST MEDICAL HISTORY: Past Medical History:  Diagnosis Date  . Anxiety   . Back pain   . Chest pain    a. 07/2015 Myoview: EF 71%, medium size, mild intensity, partially reversible septal defect with overlying breast attenuation -> likely artifact. No  significant reversible ischemia.  . Constipation   . Depression   . Edema    feet and legs  . Frequent headaches   . GERD (gastroesophageal reflux disease)   . Hypertension   . Hypothyroidism   . Joint pain   . Migraines   . OSA (obstructive sleep apnea) 03/17/2016  . Osteoarthritis   . Panic anxiety syndrome   . SOB (shortness of breath)   . Swallowing difficulty     PAST SURGICAL HISTORY: Past Surgical History:  Procedure Laterality Date  . CESAREAN SECTION  2001 & 2002  . ELBOW SURGERY    . MINOR CARPAL TUNNEL     pinched nerve in elbow  . WISDOM TOOTH EXTRACTION      SOCIAL HISTORY: Social History  Substance Use Topics  . Smoking status: Former Research scientist (life sciences)  . Smokeless tobacco: Never Used     Comment: quit 1992  . Alcohol use No    FAMILY HISTORY: Family History  Problem Relation Age of Onset  . Hypertension Mother     Living  . Arthritis Mother   . Thyroid disease Mother   . Heart disease Mother     MI at age 27  . Heart attack Mother   . Migraines Mother   . Depression Mother   . Obesity Mother   . Hypertension Maternal Grandmother   . Parkinson's disease Maternal Grandmother   . Alzheimer's disease Maternal Grandmother   . Cancer Maternal Grandfather   . Hypertension Maternal Grandfather   . Hypertension Paternal Grandmother   . Hypertension Paternal Grandfather   . Gout Brother   . ADD / ADHD Son     x1  . Other Son     #2-Unknown  . Diabetes Neg Hx     ROS: Review of Systems  Constitutional: Positive for weight loss.  Gastrointestinal: Negative for nausea and vomiting.  Genitourinary: Negative for frequency.  Musculoskeletal:       Negative muscle weakness  Endo/Heme/Allergies: Negative for polydipsia.       Negative hypoglycemia    PHYSICAL EXAM: Blood pressure 122/83, pulse 89, temperature 97.8 F (36.6 C), temperature source Oral, resp. rate 16, height 5' 2.5" (1.588 m), weight 284 lb (128.8 kg), SpO2 98 %. Body mass index is 51.12  kg/m. Physical Exam  Constitutional: She is oriented to person, place, and time. She appears well-developed and well-nourished.  Cardiovascular: Normal rate.   Pulmonary/Chest: Effort normal.  Musculoskeletal: Normal range of motion.  Neurological: She is oriented to person, place, and time.  Skin: Skin is warm and dry.  Psychiatric: She has a normal mood and affect. Her  behavior is normal.  Vitals reviewed.   RECENT LABS AND TESTS: BMET    Component Value Date/Time   NA 135 03/22/2016 1828   NA 136 03/10/2016 1051   K 3.6 03/22/2016 1828   CL 98 (L) 03/22/2016 1828   CO2 29 03/22/2016 1828   GLUCOSE 93 03/22/2016 1828   BUN 14 03/22/2016 1828   BUN 14 03/10/2016 1051   CREATININE 0.89 03/22/2016 1828   CALCIUM 9.0 03/22/2016 1828   GFRNONAA >60 03/22/2016 1828   GFRAA >60 03/22/2016 1828   Lab Results  Component Value Date   HGBA1C 4.8 03/10/2016   HGBA1C 4.7 07/22/2015   Lab Results  Component Value Date   INSULIN 37.1 (H) 03/10/2016   CBC    Component Value Date/Time   WBC 8.7 03/22/2016 1828   RBC 4.54 03/22/2016 1828   HGB 13.1 03/22/2016 1828   HCT 38.2 03/22/2016 1828   PLT 326 03/22/2016 1828   MCV 84.1 03/22/2016 1828   MCH 28.9 03/22/2016 1828   MCHC 34.3 03/22/2016 1828   RDW 13.6 03/22/2016 1828   LYMPHSABS 1.3 02/23/2016 1235   MONOABS 0.5 02/23/2016 1235   EOSABS 0.2 02/23/2016 1235   BASOSABS 0.0 02/23/2016 1235   Iron/TIBC/Ferritin/ %Sat No results found for: IRON, TIBC, FERRITIN, IRONPCTSAT Lipid Panel     Component Value Date/Time   CHOL 123 03/10/2016 1051   TRIG 87 03/10/2016 1051   HDL 38 (L) 03/10/2016 1051   CHOLHDL 3 07/22/2015 0847   VLDL 11.2 07/22/2015 0847   LDLCALC 68 03/10/2016 1051   Hepatic Function Panel     Component Value Date/Time   PROT 7.2 03/10/2016 1051   ALBUMIN 4.2 03/10/2016 1051   AST 13 03/10/2016 1051   ALT 17 03/10/2016 1051   ALKPHOS 73 03/10/2016 1051   BILITOT 0.4 03/10/2016 1051        Component Value Date/Time   TSH 0.428 (L) 03/10/2016 1051   TSH 5.19 (H) 02/23/2016 1235   TSH 0.99 10/16/2015 1135    ASSESSMENT AND PLAN: Vitamin D deficiency - Plan: Vitamin D, Ergocalciferol, (DRISDOL) 50000 units CAPS capsule  Prediabetes - Plan: metFORMIN (GLUCOPHAGE) 500 MG tablet  At risk for diabetes mellitus  Morbid obesity ([place])  PLAN:  Vitamin D Deficiency Sharon Rivers was informed that low vitamin D levels contributes to fatigue and are associated with obesity, breast, and colon cancer. She agrees to continue to take prescription Vit D @50 ,000 IU every week, we will refill for 1 month and will follow up for routine testing of vitamin D, at least 2-3 times per year. She was informed of the risk of over-replacement of vitamin D and agrees to not increase her dose unless he discusses this with [name] first.  Pre-Diabetes Sharon Rivers will continue to work on weight loss, exercise, and decreasing simple carbohydrates in her diet to help decrease the risk of diabetes. We dicussed metformin including benefits and risks. She was informed that eating too many simple carbohydrates or too many calories at one sitting increases the likelihood of GI side effects. Sharon Rivers agrees to continue to take metformin for now and a prescription was written today for 1 month refill. Sharon Rivers agreed to follow up with [name] as directed to monitor her progress.  Diabetes risk counselling Sharon Rivers was given extended (at least 15 minutes) diabetes prevention counseling today. She is 46 y.o. female and has risk factors for diabetes including obesity. We discussed intensive lifestyle modifications today with an emphasis on weight loss as  well as increasing exercise and decreasing simple carbohydrates in her diet.  Obesity Sharon Rivers is currently in the action stage of change. As such, her goal is to continue with weight loss efforts She has agreed to keep a food journal with 350 to 550 calories and 30 grams of protein at lunch  daily and follow the Category 3 plan Sharon Rivers has been instructed to work up to a goal of 150 minutes of combined cardio and strengthening exercise per week for weight loss and overall health benefits. We discussed the following Behavioral Modification Stratagies today: increasing lean protein intake, increasing lower sugar fruits and work on meal planning and easy cooking plans  Sharon Rivers has agreed to follow up with our clinic in 2 weeks. She was informed of the importance of frequent follow up visits to maximize her success with intensive lifestyle modifications for her multiple health conditions.  I, Doreene Nest, am acting as scribe for Dennard Nip, MD  I have reviewed the above documentation for accuracy and completeness, and I agree with the above. -Dennard Nip, MD

## 2016-04-13 DIAGNOSIS — G4730 Sleep apnea, unspecified: Secondary | ICD-10-CM | POA: Diagnosis not present

## 2016-04-13 NOTE — Procedures (Signed)
Patient Name: Sharon Rivers, Sharon Rivers Date: 04/10/2016 Gender: Female D.O.B: 1970/11/14 Age (years): 45 Referring Provider: Earlie Counts Height (inches): 63 Interpreting Physician: Kara Mead MD, ABSM Weight (lbs): 290 RPSGT: Baxter Flattery BMI: 32 MRN: [id number] Neck Size: 16.00   CLINICAL INFORMATION The patient is referred for a CPAP titration to treat sleep apnea.  Date of  HST: 03/2016, AHI 39/h  SLEEP STUDY TECHNIQUE As per the AASM Manual for the Scoring of Sleep and Associated Events v2.3 (April 2016) with a hypopnea requiring 4% desaturations.  The channels recorded and monitored were frontal, central and occipital EEG, electrooculogram (EOG), submentalis EMG (chin), nasal and oral airflow, thoracic and abdominal wall motion, anterior tibialis EMG, snore microphone, electrocardiogram, and pulse oximetry. Continuous positive airway pressure (CPAP) was initiated at the beginning of the study and titrated to treat sleep-disordered breathing.  MEDICATIONS Medications self-administered by patient taken the night of the study : LEVOTHYROXINE, VITAMIN D2, LISINOPRIL, AMLODIPINE, NORETHINDRONE ACETATE, PANTOPRAZOLE SODIUM, AMITRIPTYLINE, GABAPENTIN, VENLAFAXINE X12  TECHNICIAN COMMENTS Comments added by technician: Patient had difficulty initiating sleep. PATIENT TOLERATED THE MASK GOOD   RESPIRATORY PARAMETERS Optimal PAP Pressure (cm): 11 AHI at Optimal Pressure (/hr): 0.0 Overall Minimal O2 (%): 88.00 Supine % at Optimal Pressure (%): 27 Minimal O2 at Optimal Pressure (%): 90.0     SLEEP ARCHITECTURE The study was initiated at 11:03:44 PM and ended at 5:10:34 AM.  Sleep onset time was 10.3 minutes and the sleep efficiency was 92.4%. The total sleep time was 339.1 minutes.  The patient spent 5.01% of the night in stage N1 sleep, 69.48% in stage N2 sleep, 6.78% in stage N3 and 18.73% in REM.Stage REM latency was 182.0 minutes  Wake after sleep onset was 17.5. Alpha  intrusion was absent. Supine sleep was 25.02%.  CARDIAC DATA The 2 lead EKG demonstrated sinus rhythm. The mean heart rate was 75.28 beats per minute. Other EKG findings include: None.   LEG MOVEMENT DATA The total Periodic Limb Movements of Sleep (PLMS) were 72. The PLMS index was 12.74. A PLMS index of <15 is considered normal in adults.  IMPRESSIONS - The optimal PAP pressure was 11 cm of water. - Central sleep apnea was not noted during this titration (CAI = 0.0/h). - Mild oxygen desaturations were observed during this titration (min O2 = 88.00%). - No snoring was audible during this study. - No cardiac abnormalities were observed during this study. - Mild periodic limb movements were observed during this study. Arousals associated with PLMs were rare.   DIAGNOSIS - Obstructive Sleep Apnea (327.23 [G47.33 ICD-10])   RECOMMENDATIONS - Trial of CPAP therapy on 11 cm H2O with a Small size Fisher[employer]Paykel Full Face Mask Simplus mask and heated humidification. - Avoid alcohol, sedatives and other CNS depressants that may worsen sleep apnea and disrupt normal sleep architecture. - Sleep hygiene should be reviewed to assess factors that may improve sleep quality. - Weight management and regular exercise should be initiated or continued. - Return to [facility] for re-evaluation after 4 weeks of therapy    Kara Mead MD Board Certified in [place]

## 2016-04-14 ENCOUNTER — Telehealth: Payer: Self-pay | Admitting: Family

## 2016-04-14 DIAGNOSIS — G4733 Obstructive sleep apnea (adult) (pediatric): Secondary | ICD-10-CM

## 2016-04-14 NOTE — Telephone Encounter (Signed)
Sleep study show OSA.  I will arrange home cpap.  I would like to see her back in the office 1 month after starting cpap.

## 2016-04-15 ENCOUNTER — Ambulatory Visit (HOSPITAL_COMMUNITY): Payer: BLUE CROSS/BLUE SHIELD | Attending: Internal Medicine

## 2016-04-15 ENCOUNTER — Other Ambulatory Visit: Payer: Self-pay

## 2016-04-15 DIAGNOSIS — E669 Obesity, unspecified: Secondary | ICD-10-CM | POA: Insufficient documentation

## 2016-04-15 DIAGNOSIS — R072 Precordial pain: Secondary | ICD-10-CM | POA: Insufficient documentation

## 2016-04-15 DIAGNOSIS — I10 Essential (primary) hypertension: Secondary | ICD-10-CM | POA: Insufficient documentation

## 2016-04-15 DIAGNOSIS — Z6841 Body Mass Index (BMI) 40.0 and over, adult: Secondary | ICD-10-CM | POA: Insufficient documentation

## 2016-04-15 NOTE — Telephone Encounter (Signed)
Tried to reach pt lvm to call back.  

## 2016-04-19 ENCOUNTER — Other Ambulatory Visit: Payer: Self-pay | Admitting: Family

## 2016-04-19 ENCOUNTER — Other Ambulatory Visit (INDEPENDENT_AMBULATORY_CARE_PROVIDER_SITE_OTHER): Payer: Self-pay | Admitting: Family Medicine

## 2016-04-19 DIAGNOSIS — E039 Hypothyroidism, unspecified: Secondary | ICD-10-CM

## 2016-04-19 DIAGNOSIS — R7303 Prediabetes: Secondary | ICD-10-CM

## 2016-04-19 DIAGNOSIS — E559 Vitamin D deficiency, unspecified: Secondary | ICD-10-CM

## 2016-04-19 NOTE — Telephone Encounter (Signed)
Notified pt and she voices understanding and is agreeable to proceed with CPAP. Pt will call back to schedule follow up once she gets set up on CPAP machine.

## 2016-04-19 NOTE — Telephone Encounter (Signed)
30 day supply of levothyroxine was sent to pharmacy. Pt last TSH was 03/15/16 and I am unsure if we addressed the result.  Please advise?

## 2016-04-20 NOTE — Telephone Encounter (Signed)
It looks like this was ordered by Dr. Leafy Ro, lets have her repeat TSH please.

## 2016-04-20 NOTE — Telephone Encounter (Signed)
Notified pt and she voices understanding. Lab appt scheduled for tomorrow at 7:15am, future order entered. Pt also states that she will be having her CPAP set up on 04/27/16. Scheduled pt for 1 month f/u of OSA / CPAP on 05/31/16 at 11:45am.

## 2016-04-21 ENCOUNTER — Other Ambulatory Visit (INDEPENDENT_AMBULATORY_CARE_PROVIDER_SITE_OTHER): Payer: BLUE CROSS/BLUE SHIELD

## 2016-04-21 ENCOUNTER — Encounter: Payer: Self-pay | Admitting: Nurse Practitioner

## 2016-04-21 ENCOUNTER — Ambulatory Visit (INDEPENDENT_AMBULATORY_CARE_PROVIDER_SITE_OTHER): Payer: BLUE CROSS/BLUE SHIELD | Admitting: Nurse Practitioner

## 2016-04-21 VITALS — BP 143/109 | HR 73 | Ht 62.5 in | Wt 291.4 lb

## 2016-04-21 DIAGNOSIS — R0789 Other chest pain: Secondary | ICD-10-CM | POA: Diagnosis not present

## 2016-04-21 DIAGNOSIS — G4733 Obstructive sleep apnea (adult) (pediatric): Secondary | ICD-10-CM

## 2016-04-21 DIAGNOSIS — I10 Essential (primary) hypertension: Secondary | ICD-10-CM | POA: Diagnosis not present

## 2016-04-21 DIAGNOSIS — E039 Hypothyroidism, unspecified: Secondary | ICD-10-CM

## 2016-04-21 LAB — TSH: TSH: 1.98 u[IU]/mL (ref 0.35–4.50)

## 2016-04-21 MED ORDER — AMLODIPINE BESYLATE 10 MG PO TABS: 10.0000 mg | ORAL_TABLET | Freq: Every day | ORAL | 5 refills | Status: DC

## 2016-04-21 NOTE — Patient Instructions (Signed)
Increase your amlodipine from 5mg  to 10mg  daily. Continue to check your blood pressure readings at home daily.  Your physician wants you to follow-up as needed. Call us if you have concerns or questions.

## 2016-04-21 NOTE — Progress Notes (Signed)
Office Visit    Patient Name: Sharon Rivers Date of Encounter: 04/21/2016  Primary Care Provider:  [name]., NP Primary Cardiologist:  Corky Downs, MD   Chief Complaint    46 year old female with history of morbid obesity, sleep apnea, atypical chest pain, hypertension, dyspnea on exertion, who presents for follow-up.  Past Medical History    Past Medical History:  Diagnosis Date  . Anxiety   . Back pain   . Chest pain    a. 07/2015 Myoview: EF 71%, medium size, mild intensity, partially reversible septal defect with overlying breast attenuation -> likely artifact. No significant reversible ischemia.  . Constipation   . Depression   . Edema    feet and legs  . Frequent headaches   . GERD (gastroesophageal reflux disease)   . Hypertension   . Hypothyroidism   . Joint pain   . Migraines   . OSA (obstructive sleep apnea) 03/17/2016  . Osteoarthritis   . Panic anxiety syndrome   . SOB (shortness of breath)    a. 04/2016 Echo: EF 60-65%, Gr1 DD.  [name] Swallowing difficulty    Past Surgical History:  Procedure Laterality Date  . CESAREAN SECTION  2001 & 2002  . ELBOW SURGERY    . MINOR CARPAL TUNNEL     pinched nerve in elbow  . WISDOM TOOTH EXTRACTION      Allergies  Allergies  Allergen Reactions  . Adhesive [Tape] Other (See Comments)    Skin Burn.  [name] [Menthol (Topical Analgesic)] Other (See Comments)    Skin Burn.  . Sulfa Antibiotics Rash    History of Present Illness    46 year old female with the above past medical history including morbid obesity, sleep apnea, arthritis, migraines, anxiety, depression, GERD, hypertension, atypical chest pain, dyspnea on exertion, and hypothyroidism. Chest pain history dates back to the spring of 2017, at which time she was evaluated with stress testing, which was deemed to be low risk with attenuation artifact and normal LV function. She had recurrent bandlike chest discomfort on February 13 and was seen  in the emergency department. ECG, Troponin, chest x-ray, and d-dimer were normal and she was discharged home with cardiology follow-up. I saw her in February 22. At that point, she was having occasional brief episodes of chest discomfort that was noting dyspnea on exertion more than anything. I obtained an echocardiogram which showed normal LV function and grade 1 diastolic dysfunction.  Since her last visit, she has done reasonably well. She has not had any significant or prolonged chest discomfort. Dyspnea seems to have improved some. She recently had a sleep study and has been diagnosed with sleep apnea. She picks up her CPAP next week. She denies palpitations, PND, orthopnea, dizziness, syncope, edema, or early satiety.  Home Medications    Prior to Admission medications   Medication Sig Start Date End Date Taking? Authorizing Provider  amitriptyline (ELAVIL) 25 MG tablet Take 1 tablet (25 mg total) by mouth at bedtime. 03/16/16  Yes [name], NP  amLODipine (NORVASC) 5 MG tablet Take 1 tablet (5 mg total) by mouth daily. 02/23/16  Yes [name], NP  fluticasone (FLONASE) 50 MCG/ACT nasal spray Place 2 sprays into both nostrils daily. 03/14/16  Yes [name], NP  gabapentin (NEURONTIN) 300 MG capsule Take 300 mg by mouth 3 (three) times daily.   Yes Historical Provider, MD  levothyroxine (SYNTHROID, LEVOTHROID) 175 MCG tablet TAKE 1 TABLET(175 MCG) BY MOUTH DAILY BEFORE BREAKFAST 04/19/16  Yes [name], NP  lisinopril (PRINIVIL,ZESTRIL) 20 MG tablet Take 1 tablet (20 mg total) by mouth daily. 07/22/15  Yes Brunetta Jeans, PA-C  loratadine (CLARITIN) 10 MG tablet Take 1 tablet (10 mg total) by mouth daily. 03/14/16  Yes [name], NP  metFORMIN (GLUCOPHAGE) 500 MG tablet Take 1 tablet (500 mg total) by mouth daily with breakfast. 04/12/16  Yes Caren D Leafy Ro, MD  norethindrone (JOLIVETTE) 0.35 MG tablet Take 1 tablet (0.35 mg total) by mouth daily. 07/27/15   Yes Brunetta Jeans, PA-C  ondansetron (ZOFRAN) 4 MG tablet Take 1 tablet (4 mg total) by mouth every 8 (eight) hours as needed for nausea or vomiting. 08/21/15  Yes Brunetta Jeans, PA-C  pantoprazole (PROTONIX) 40 MG tablet Take 1 tablet (40 mg total) by mouth daily. 02/23/16  Yes [name], NP  SUMAtriptan (IMITREX) 50 MG tablet Take 1 tablet (50 mg total) by mouth once. May repeat in 2 hours if headache persists or recurs. 08/21/15  Yes Brunetta Jeans, PA-C  venlafaxine XR (EFFEXOR-XR) 37.5 MG 24 hr capsule TAKE 1 CAPSULE BY MOUTH EVERY DAY FOR 3 DAYS. THEN INCREASE TO 2 CAPSULE CAPSULES DAILY 04/19/16  Yes [name], NP  Vitamin D, Ergocalciferol, (DRISDOL) 50000 units CAPS capsule Take 1 capsule (50,000 Units total) by mouth every 7 (seven) days. 04/12/16  Yes Caren D Leafy Ro, MD    Review of Systems    Still with some dyspnea on exertion but this seems to be slightly improved. Occasional brief/fleeting chest discomfort.  All other systems reviewed and are otherwise negative except as noted above.  Physical Exam    VS:  BP (!) 143/109   Pulse 73   Ht 5' 2.5" (1.588 m)   Wt 291 lb 6.4 oz (132.2 kg)   SpO2 98%   BMI 52.45 kg/m  , BMI Body mass index is 52.45 kg/m. GEN: Morbidly obese, in no acute distress.  HEENT: normal.  Neck: Supple, obese, difficult to gauge JVP, no carotid bruits, or masses. Cardiac: RRR, soft systolic murmur at the left upper sternal border, no rubs, or gallops. No clubbing, cyanosis, edema.  Radials/DP/PT 2+ and equal bilaterally.  Respiratory:  Respirations regular and unlabored, clear to auscultation bilaterally. GI: Soft, nontender, nondistended, BS + x 4. MS: no deformity or atrophy. Skin: warm and dry, no rash. Neuro:  Strength and sensation are intact. Psych: Normal affect.  Accessory Clinical Findings    2D Echocardiogram 3.9.2018  Study Conclusions   - Left ventricle: The cavity size was normal. Wall thickness was   increased  in a pattern of moderate LVH. Systolic function was   normal. The estimated ejection fraction was in the range of 60%   to 65%. Wall motion was normal; there were no regional wall   motion abnormalities. Doppler parameters are consistent with   abnormal left ventricular relaxation (grade 1 diastolic   dysfunction). The E/e&' ratio is between 8-15, suggesting   indeterminate LV filling pressure. - Left atrium: The atrium was normal in size. - Inferior vena cava: The vessel was normal in size. The   respirophasic diameter changes were in the normal range (>= 50%),   consistent with normal central venous pressure.   Impressions:   - LVEF 60-65%, moderate LVH, normal wall motion, diastolic   dysfunction, indetermiante LV filling pressure, normal LA size,   normal IVC.   Assessment & Plan    1.  Atypical chest pain: Patient has a history of chest  pain with negative Myoview in 2017. She was recently evaluated in emergency department with recurrent chest pain and normal enzymes and EKG. I recently obtained an echo that showed normal LV function with diastolic dysfunction. Since her last visit, she has had only a few episodes of fleeting discomfort. Given atypical symptoms and previous nonischemic workup, we will not pursue this further.  2. Essential hypertension: Blood pressure is elevated today. She is not sure what it runs at home. I will increase her amlodipine to 10 mg daily. She remains on lisinopril 20 g daily. If pressure remains elevated, lisinopril could be titrated to 40 mg. She will likely require an additional agent however.  3. Obstructive apnea: Status post recent sleep study. She is pending placement of CPAP.  4. Disposition: Follow-up when necessary.  Murray Hodgkins, NP 04/21/2016, 9:04 AM

## 2016-04-24 ENCOUNTER — Encounter: Payer: Self-pay | Admitting: Family

## 2016-04-26 ENCOUNTER — Ambulatory Visit (INDEPENDENT_AMBULATORY_CARE_PROVIDER_SITE_OTHER): Payer: BLUE CROSS/BLUE SHIELD | Admitting: Family Medicine

## 2016-04-26 VITALS — BP 129/85 | HR 85 | Temp 98.8°F | Ht 63.0 in | Wt 284.0 lb

## 2016-04-26 DIAGNOSIS — F3289 Other specified depressive episodes: Secondary | ICD-10-CM | POA: Diagnosis not present

## 2016-04-26 MED ORDER — TOPIRAMATE 50 MG PO TABS: 50.0000 mg | ORAL_TABLET | Freq: Every day | ORAL | 0 refills | Status: DC

## 2016-04-26 NOTE — Progress Notes (Signed)
Office: [phone]  /  Fax: [phone]   HPI:   Chief Complaint: OBESITY Sharon Rivers is here to discuss her progress with her obesity treatment plan. She is following her eating plan approximately 100 % of the time and states she is exercising 30 minutes 3 times per week. Sharon Rivers is off track with weight loss, mostly portion control/smart choices. She is working on [employer] around 50 % of the time. She is still struggling with emotional eating. Her weight is 284 lb (128.8 kg) today and has maintained her weight over a period of 2 weeks since her last visit. She has gained 4 lbs since starting treatment with [name].  Depression with emotional eating behaviors Sharon Rivers is struggling with emotional eating and using food for comfort to the extent that it is negatively impacting her health. She often snacks when she is not hungry. Sharon Rivers sometimes feels she is out of control and then feels guilty that she made poor food choices. She is on effexor but still notes emotional eating. She has been working on behavior modification techniques to help reduce her emotional eating and has been somewhat successful. She shows no sign of suicidal or homicidal ideations. She is on birth control and not sexually active.  Depression screen [facility] 2/9 03/10/2016 11/12/2015  Decreased Interest 3 0  Down, Depressed, Hopeless 3 1  PHQ - 2 Score 6 1  Altered sleeping 3 -  Tired, decreased energy 3 -  Change in appetite 3 -  Feeling bad or failure about yourself  3 -  Trouble concentrating 3 -  Moving slowly or fidgety/restless 2 -  PHQ-9 Score 23 -     Wt Readings from Last 500 Encounters:  04/26/16 284 lb (128.8 kg)  04/21/16 291 lb 6.4 oz (132.2 kg)  04/12/16 284 lb (128.8 kg)  04/10/16 290 lb (131.5 kg)  03/31/16 290 lb 12.8 oz (131.9 kg)  03/30/16 288 lb 6.4 oz (130.8 kg)  03/25/16 289 lb 12.8 oz (131.5 kg)  03/24/16 286 lb (129.7 kg)  03/22/16 285 lb (129.3 kg)  03/14/16 286 lb (129.7 kg)  03/10/16  280 lb (127 kg)  02/23/16 295 lb 6.4 oz (134 kg)  11/12/15 282 lb (127.9 kg)  10/16/15 280 lb 8 oz (127.2 kg)  08/14/15 279 lb 6 oz (126.7 kg)  08/05/15 282 lb (127.9 kg)  07/29/15 274 lb 8 oz (124.5 kg)  07/22/15 282 lb 4 oz (128 kg)  07/14/15 279 lb (126.6 kg)  07/07/15 279 lb 11.2 oz (126.9 kg)     ALLERGIES: Allergies  Allergen Reactions  . Adhesive [Tape] Other (See Comments)    Skin Burn.  [name] [Menthol (Topical Analgesic)] Other (See Comments)    Skin Burn.  . Sulfa Antibiotics Rash    MEDICATIONS: Current Outpatient Prescriptions on File Prior to Visit  Medication Sig Dispense Refill  . amitriptyline (ELAVIL) 25 MG tablet Take 1 tablet (25 mg total) by mouth at bedtime. 30 tablet 1  . amLODipine (NORVASC) 10 MG tablet Take 1 tablet (10 mg total) by mouth daily. 30 tablet 5  . fluticasone (FLONASE) 50 MCG/ACT nasal spray Place 2 sprays into both nostrils daily. 16 g 0  . gabapentin (NEURONTIN) 300 MG capsule Take 300 mg by mouth 3 (three) times daily.    [name] levothyroxine (SYNTHROID, LEVOTHROID) 175 MCG tablet TAKE 1 TABLET(175 MCG) BY MOUTH DAILY BEFORE BREAKFAST 30 tablet 0  . lisinopril (PRINIVIL,ZESTRIL) 20 MG tablet Take 1 tablet (20 mg total) by mouth  daily. 30 tablet 1  . loratadine (CLARITIN) 10 MG tablet Take 1 tablet (10 mg total) by mouth daily. 30 tablet 11  . metFORMIN (GLUCOPHAGE) 500 MG tablet Take 1 tablet (500 mg total) by mouth daily with breakfast. 30 tablet 0  . norethindrone (JOLIVETTE) 0.35 MG tablet Take 1 tablet (0.35 mg total) by mouth daily. 1 Package 12  . ondansetron (ZOFRAN) 4 MG tablet Take 1 tablet (4 mg total) by mouth every 8 (eight) hours as needed for nausea or vomiting. 20 tablet 0  . pantoprazole (PROTONIX) 40 MG tablet Take 1 tablet (40 mg total) by mouth daily. 30 tablet 3  . SUMAtriptan (IMITREX) 50 MG tablet Take 1 tablet (50 mg total) by mouth once. May repeat in 2 hours if headache persists or recurs. 10 tablet 0  .  venlafaxine XR (EFFEXOR-XR) 37.5 MG 24 hr capsule TAKE 1 CAPSULE BY MOUTH EVERY DAY FOR 3 DAYS. THEN INCREASE TO 2 CAPSULE CAPSULES DAILY 60 capsule 3  . Vitamin D, Ergocalciferol, (DRISDOL) 50000 units CAPS capsule Take 1 capsule (50,000 Units total) by mouth every 7 (seven) days. 4 capsule 0   No current facility-administered medications on file prior to visit.     PAST MEDICAL HISTORY: Past Medical History:  Diagnosis Date  . Anxiety   . Back pain   . Chest pain    a. 07/2015 Myoview: EF 71%, medium size, mild intensity, partially reversible septal defect with overlying breast attenuation -> likely artifact. No significant reversible ischemia.  . Constipation   . Depression   . Edema    feet and legs  . Frequent headaches   . GERD (gastroesophageal reflux disease)   . Hypertension   . Hypothyroidism   . Joint pain   . Migraines   . OSA (obstructive sleep apnea) 03/17/2016  . Osteoarthritis   . Panic anxiety syndrome   . SOB (shortness of breath)    a. 04/2016 Echo: EF 60-65%, Gr1 DD.  [name] Swallowing difficulty     PAST SURGICAL HISTORY: Past Surgical History:  Procedure Laterality Date  . CESAREAN SECTION  2001 & 2002  . ELBOW SURGERY    . MINOR CARPAL TUNNEL     pinched nerve in elbow  . WISDOM TOOTH EXTRACTION      SOCIAL HISTORY: Social History  Substance Use Topics  . Smoking status: Former Research scientist (life sciences)  . Smokeless tobacco: Never Used     Comment: quit 1992  . Alcohol use No    FAMILY HISTORY: Family History  Problem Relation Age of Onset  . Hypertension Mother     Living  . Arthritis Mother   . Thyroid disease Mother   . Heart disease Mother     MI at age 87  . Heart attack Mother   . Migraines Mother   . Depression Mother   . Obesity Mother   . Hypertension Maternal Grandmother   . Parkinson's disease Maternal Grandmother   . Alzheimer's disease Maternal Grandmother   . Cancer Maternal Grandfather   . Hypertension Maternal Grandfather   . Hypertension  Paternal Grandmother   . Hypertension Paternal Grandfather   . Gout Brother   . ADD / ADHD Son     x1  . Other Son     #2-Unknown  . Diabetes Neg Hx     ROS: Review of Systems  Constitutional: Negative for weight loss.  Psychiatric/Behavioral: Positive for depression. Negative for suicidal ideas.    PHYSICAL EXAM: Blood pressure 129/85, pulse 85,  temperature 98.8 F (37.1 C), temperature source Oral, height 5\' 3"  (1.6 m), weight 284 lb (128.8 kg), last menstrual period 04/12/2016, SpO2 100 %. Body mass index is 50.31 kg/m. Physical Exam  Constitutional: She is oriented to person, place, and time. She appears well-developed and well-nourished.  Cardiovascular: Normal rate.   Pulmonary/Chest: Effort normal.  Musculoskeletal: Normal range of motion.  Neurological: She is oriented to person, place, and time.  Skin: Skin is warm and dry.  Vitals reviewed.   RECENT LABS AND TESTS: BMET    Component Value Date/Time   NA 135 03/22/2016 1828   NA 136 03/10/2016 1051   K 3.6 03/22/2016 1828   CL 98 (L) 03/22/2016 1828   CO2 29 03/22/2016 1828   GLUCOSE 93 03/22/2016 1828   BUN 14 03/22/2016 1828   BUN 14 03/10/2016 1051   CREATININE 0.89 03/22/2016 1828   CALCIUM 9.0 03/22/2016 1828   GFRNONAA >60 03/22/2016 1828   GFRAA >60 03/22/2016 1828   Lab Results  Component Value Date   HGBA1C 4.8 03/10/2016   HGBA1C 4.7 07/22/2015   Lab Results  Component Value Date   INSULIN 37.1 (H) 03/10/2016   CBC    Component Value Date/Time   WBC 8.7 03/22/2016 1828   RBC 4.54 03/22/2016 1828   HGB 13.1 03/22/2016 1828   HCT 38.2 03/22/2016 1828   PLT 326 03/22/2016 1828   MCV 84.1 03/22/2016 1828   MCH 28.9 03/22/2016 1828   MCHC 34.3 03/22/2016 1828   RDW 13.6 03/22/2016 1828   LYMPHSABS 1.3 02/23/2016 1235   MONOABS 0.5 02/23/2016 1235   EOSABS 0.2 02/23/2016 1235   BASOSABS 0.0 02/23/2016 1235   Iron/TIBC/Ferritin/ %Sat No results found for: IRON, TIBC, FERRITIN,  IRONPCTSAT Lipid Panel     Component Value Date/Time   CHOL 123 03/10/2016 1051   TRIG 87 03/10/2016 1051   HDL 38 (L) 03/10/2016 1051   CHOLHDL 3 07/22/2015 0847   VLDL 11.2 07/22/2015 0847   LDLCALC 68 03/10/2016 1051   Hepatic Function Panel     Component Value Date/Time   PROT 7.2 03/10/2016 1051   ALBUMIN 4.2 03/10/2016 1051   AST 13 03/10/2016 1051   ALT 17 03/10/2016 1051   ALKPHOS 73 03/10/2016 1051   BILITOT 0.4 03/10/2016 1051      Component Value Date/Time   TSH 1.98 04/21/2016 0709   TSH 0.428 (L) 03/10/2016 1051   TSH 5.19 (H) 02/23/2016 1235    ASSESSMENT AND PLAN: Other depression - Plan: topiramate (TOPAMAX) 50 MG tablet  Morbid obesity (HCC)  PLAN:  Depression with Emotional Eating Behaviors We discussed behavior modification techniques today to help Jenilyn deal with her emotional eating and depression. She has agreed to start to take topiramate 50 mg qhs #30 with no refills and agreed to follow up as directed. Nabria wants to continue birth control, faithful due to risk of birth defects on topiramate.  We spent > than 50% of the 30 minute visit on the counseling as documented in the note.  Obesity Darnelle is not currently in the action stage of change. As such, her goal is to get back to weightloss efforts  She has agreed to keep a food journal with 400 to 500 calories and 30 grams of  Protein at lunch daily and follow the Category 3 plan Adlene has been instructed to work up to a goal of 150 minutes of combined cardio and strengthening exercise per week for weight loss and overall  health benefits. We discussed the following Behavioral Modification Stratagies today: increasing lean protein intake, decreasing simple carbohydrates , increasing lower sugar fruits and emotional eating strategies  Dotty has agreed to follow up with our clinic in 2 weeks. She was informed of the importance of frequent follow up visits to maximize her success with intensive  lifestyle modifications for her multiple health conditions.  I, Doreene Nest, am acting as scribe for Dennard Nip, MD  I have reviewed the above documentation for accuracy and completeness, and I agree with the above. -Dennard Nip, MD

## 2016-05-10 ENCOUNTER — Ambulatory Visit (INDEPENDENT_AMBULATORY_CARE_PROVIDER_SITE_OTHER): Payer: BLUE CROSS/BLUE SHIELD | Admitting: Family Medicine

## 2016-05-10 VITALS — BP 86/62 | HR 97 | Temp 98.5°F | Ht 63.0 in | Wt 279.0 lb

## 2016-05-10 DIAGNOSIS — E559 Vitamin D deficiency, unspecified: Secondary | ICD-10-CM | POA: Diagnosis not present

## 2016-05-10 DIAGNOSIS — E8881 Metabolic syndrome: Secondary | ICD-10-CM

## 2016-05-10 MED ORDER — METFORMIN HCL 500 MG PO TABS: 500.0000 mg | ORAL_TABLET | Freq: Every day | ORAL | 0 refills | Status: DC

## 2016-05-10 MED ORDER — VITAMIN D (ERGOCALCIFEROL) 1.25 MG (50000 UNIT) PO CAPS
50000.0000 [IU] | ORAL_CAPSULE | ORAL | 0 refills | Status: DC
Start: 2016-05-10 — End: 2016-06-13

## 2016-05-10 NOTE — Progress Notes (Signed)
Office: [phone]  /  Fax: [phone]   HPI:   Chief Complaint: OBESITY Sharon Rivers is here to discuss her progress with her obesity treatment plan. She is following her eating plan approximately 85 % of the time and states she is exercising 30 minutes 3 to 4 times per week. Sharon Rivers continues to do well with weight loss but often not eating enough calories and likely not enough protein. She is not journaling much, mostly portion control/smart choices. Her weight is 279 lb (126.6 kg) today and has had a weight loss of 5 pounds over a period of 2 weeks since her last visit. She has lost 1 lb since starting treatment with [name].  Vitamin D deficiency Sharon Rivers has a diagnosis of vitamin D deficiency. She is currently stable on vit D, not yet at goal. she denies nausea, vomiting or muscle weakness.  Insulin Resistance Sharon Rivers has a diagnosis of insulin resistance based on her elevated fasting insulin level >5. Although Sharon Rivers blood glucose readings are still under good control, insulin resistance puts her at greater risk of metabolic syndrome and diabetes. She is stable on metformin currently and continues to work on diet and exercise to decrease risk of diabetes. She denies nausea, vomiting or hypoglycemia and notes decreased polyphagia.  Wt Readings from Last 500 Encounters:  05/10/16 279 lb (126.6 kg)  04/26/16 284 lb (128.8 kg)  04/21/16 291 lb 6.4 oz (132.2 kg)  04/12/16 284 lb (128.8 kg)  04/10/16 290 lb (131.5 kg)  03/31/16 290 lb 12.8 oz (131.9 kg)  03/30/16 288 lb 6.4 oz (130.8 kg)  03/25/16 289 lb 12.8 oz (131.5 kg)  03/24/16 286 lb (129.7 kg)  03/22/16 285 lb (129.3 kg)  03/14/16 286 lb (129.7 kg)  03/10/16 280 lb (127 kg)  02/23/16 295 lb 6.4 oz (134 kg)  11/12/15 282 lb (127.9 kg)  10/16/15 280 lb 8 oz (127.2 kg)  08/14/15 279 lb 6 oz (126.7 kg)  08/05/15 282 lb (127.9 kg)  07/29/15 274 lb 8 oz (124.5 kg)  07/22/15 282 lb 4 oz (128 kg)  07/14/15 279 lb (126.6 kg)  07/07/15  279 lb 11.2 oz (126.9 kg)     ALLERGIES: Allergies  Allergen Reactions  . Adhesive [Tape] Other (See Comments)    Skin Burn.  [name] [Menthol (Topical Analgesic)] Other (See Comments)    Skin Burn.  . Sulfa Antibiotics Rash    MEDICATIONS: Current Outpatient Prescriptions on File Prior to Visit  Medication Sig Dispense Refill  . amitriptyline (ELAVIL) 25 MG tablet Take 1 tablet (25 mg total) by mouth at bedtime. 30 tablet 1  . amLODipine (NORVASC) 10 MG tablet Take 1 tablet (10 mg total) by mouth daily. 30 tablet 5  . fluticasone (FLONASE) 50 MCG/ACT nasal spray Place 2 sprays into both nostrils daily. 16 g 0  . gabapentin (NEURONTIN) 300 MG capsule Take 300 mg by mouth 3 (three) times daily.    [name] levothyroxine (SYNTHROID, LEVOTHROID) 175 MCG tablet TAKE 1 TABLET(175 MCG) BY MOUTH DAILY BEFORE BREAKFAST 30 tablet 0  . lisinopril (PRINIVIL,ZESTRIL) 20 MG tablet Take 1 tablet (20 mg total) by mouth daily. 30 tablet 1  . loratadine (CLARITIN) 10 MG tablet Take 1 tablet (10 mg total) by mouth daily. 30 tablet 11  . norethindrone (JOLIVETTE) 0.35 MG tablet Take 1 tablet (0.35 mg total) by mouth daily. 1 Package 12  . ondansetron (ZOFRAN) 4 MG tablet Take 1 tablet (4 mg total) by mouth every 8 (eight) hours as  needed for nausea or vomiting. 20 tablet 0  . pantoprazole (PROTONIX) 40 MG tablet Take 1 tablet (40 mg total) by mouth daily. 30 tablet 3  . SUMAtriptan (IMITREX) 50 MG tablet Take 1 tablet (50 mg total) by mouth once. May repeat in 2 hours if headache persists or recurs. 10 tablet 0  . topiramate (TOPAMAX) 50 MG tablet Take 1 tablet (50 mg total) by mouth daily. Take one tab QHS 30 tablet 0  . venlafaxine XR (EFFEXOR-XR) 37.5 MG 24 hr capsule TAKE 1 CAPSULE BY MOUTH EVERY DAY FOR 3 DAYS. THEN INCREASE TO 2 CAPSULE CAPSULES DAILY 60 capsule 3   No current facility-administered medications on file prior to visit.     PAST MEDICAL HISTORY: Past Medical History:  Diagnosis Date   . Anxiety   . Back pain   . Chest pain    a. 07/2015 Myoview: EF 71%, medium size, mild intensity, partially reversible septal defect with overlying breast attenuation -> likely artifact. No significant reversible ischemia.  . Constipation   . Depression   . Edema    feet and legs  . Frequent headaches   . GERD (gastroesophageal reflux disease)   . Hypertension   . Hypothyroidism   . Joint pain   . Migraines   . OSA (obstructive sleep apnea) 03/17/2016  . Osteoarthritis   . Panic anxiety syndrome   . SOB (shortness of breath)    a. 04/2016 Echo: EF 60-65%, Gr1 DD.  [name] Swallowing difficulty     PAST SURGICAL HISTORY: Past Surgical History:  Procedure Laterality Date  . CESAREAN SECTION  2001 & 2002  . ELBOW SURGERY    . MINOR CARPAL TUNNEL     pinched nerve in elbow  . WISDOM TOOTH EXTRACTION      SOCIAL HISTORY: Social History  Substance Use Topics  . Smoking status: Former Research scientist (life sciences)  . Smokeless tobacco: Never Used     Comment: quit 1992  . Alcohol use No    FAMILY HISTORY: Family History  Problem Relation Age of Onset  . Hypertension Mother     Living  . Arthritis Mother   . Thyroid disease Mother   . Heart disease Mother     MI at age 9  . Heart attack Mother   . Migraines Mother   . Depression Mother   . Obesity Mother   . Hypertension Maternal Grandmother   . Parkinson's disease Maternal Grandmother   . Alzheimer's disease Maternal Grandmother   . Cancer Maternal Grandfather   . Hypertension Maternal Grandfather   . Hypertension Paternal Grandmother   . Hypertension Paternal Grandfather   . Gout Brother   . ADD / ADHD Son     x1  . Other Son     #2-Unknown  . Diabetes Neg Hx     ROS: Review of Systems  Constitutional: Positive for weight loss.  Gastrointestinal: Negative for nausea and vomiting.  Musculoskeletal:       Negative muscle weakness  Endo/Heme/Allergies:       Polyphagia Negative hypoglycemia    PHYSICAL EXAM: Blood pressure  (!) 86/62, pulse 97, temperature 98.5 F (36.9 C), temperature source Oral, height 5\' 3"  (1.6 m), weight 279 lb (126.6 kg), last menstrual period 04/12/2016, SpO2 99 %. Body mass index is 49.42 kg/m. Physical Exam  RECENT LABS AND TESTS: BMET    Component Value Date/Time   NA 135 03/22/2016 1828   NA 136 03/10/2016 1051   K 3.6 03/22/2016 1828  CL 98 (L) 03/22/2016 1828   CO2 29 03/22/2016 1828   GLUCOSE 93 03/22/2016 1828   BUN 14 03/22/2016 1828   BUN 14 03/10/2016 1051   CREATININE 0.89 03/22/2016 1828   CALCIUM 9.0 03/22/2016 1828   GFRNONAA >60 03/22/2016 1828   GFRAA >60 03/22/2016 1828   Lab Results  Component Value Date   HGBA1C 4.8 03/10/2016   HGBA1C 4.7 07/22/2015   Lab Results  Component Value Date   INSULIN 37.1 (H) 03/10/2016   CBC    Component Value Date/Time   WBC 8.7 03/22/2016 1828   RBC 4.54 03/22/2016 1828   HGB 13.1 03/22/2016 1828   HCT 38.2 03/22/2016 1828   PLT 326 03/22/2016 1828   MCV 84.1 03/22/2016 1828   MCH 28.9 03/22/2016 1828   MCHC 34.3 03/22/2016 1828   RDW 13.6 03/22/2016 1828   LYMPHSABS 1.3 02/23/2016 1235   MONOABS 0.5 02/23/2016 1235   EOSABS 0.2 02/23/2016 1235   BASOSABS 0.0 02/23/2016 1235   Iron/TIBC/Ferritin/ %Sat No results found for: IRON, TIBC, FERRITIN, IRONPCTSAT Lipid Panel     Component Value Date/Time   CHOL 123 03/10/2016 1051   TRIG 87 03/10/2016 1051   HDL 38 (L) 03/10/2016 1051   CHOLHDL 3 07/22/2015 0847   VLDL 11.2 07/22/2015 0847   LDLCALC 68 03/10/2016 1051   Hepatic Function Panel     Component Value Date/Time   PROT 7.2 03/10/2016 1051   ALBUMIN 4.2 03/10/2016 1051   AST 13 03/10/2016 1051   ALT 17 03/10/2016 1051   ALKPHOS 73 03/10/2016 1051   BILITOT 0.4 03/10/2016 1051      Component Value Date/Time   TSH 1.98 04/21/2016 0709   TSH 0.428 (L) 03/10/2016 1051   TSH 5.19 (H) 02/23/2016 1235    ASSESSMENT AND PLAN: Vitamin D deficiency - Plan: Vitamin D, Ergocalciferol,  (DRISDOL) 50000 units CAPS capsule  Insulin resistance - Plan: metFORMIN (GLUCOPHAGE) 500 MG tablet  Morbid obesity ([place])  PLAN:  Vitamin D Deficiency Amazing was informed that low vitamin D levels contributes to fatigue and are associated with obesity, breast, and colon cancer. She agrees to continue to take prescription Vit D @50 ,000 IU every week, we will refill for 1 month and will follow up for routine testing of vitamin D, at least 2-3 times per year. She was informed of the risk of over-replacement of vitamin D and agrees to not increase her dose unless he discusses this with [name] first.  Insulin Resistance Mayrene will continue to work on weight loss, exercise, and decreasing simple carbohydrates in her diet to help decrease the risk of diabetes. We dicussed metformin including benefits and risks. She was informed that eating too many simple carbohydrates or too many calories at one sitting increases the likelihood of GI side effects. Concha agrees to continue to take metformin for now and prescription was written today for 1 month refill. Amijah agreed to follow up with [name] as directed to monitor her progress.  Obesity Oaklynn is currently in the action stage of change. As such, her goal is to continue with weight loss efforts She has agreed to keep a food journal with 400 to 500 calories and 30+ grams of protein at lunch daily and follow the Category 2 plan Louie has been instructed to work up to a goal of 150 minutes of combined cardio and strengthening exercise per week or continue walking 30 minutes 3 to 4 times per week for weight loss and overall health  benefits. We discussed the following Behavioral Modification Stratagies today: increasing lean protein intake, increasing lower sugar fruits, ways to avoid boredom eating and decrease liquid calories  Chinwe has agreed to follow up with our clinic in 2 weeks. She was informed of the importance of frequent follow up visits to maximize  her success with intensive lifestyle modifications for her multiple health conditions.  I, Doreene Nest, am acting as scribe for Dennard Nip, MD  I have reviewed the above documentation for accuracy and completeness, and I agree with the above. -Dennard Nip, MD

## 2016-05-16 ENCOUNTER — Ambulatory Visit (INDEPENDENT_AMBULATORY_CARE_PROVIDER_SITE_OTHER): Payer: BLUE CROSS/BLUE SHIELD | Admitting: Neurology

## 2016-05-16 ENCOUNTER — Encounter: Payer: Self-pay | Admitting: Neurology

## 2016-05-16 ENCOUNTER — Other Ambulatory Visit: Payer: Self-pay | Admitting: Family

## 2016-05-16 VITALS — BP 122/70 | HR 86 | Ht 63.0 in | Wt 279.2 lb

## 2016-05-16 DIAGNOSIS — G43709 Chronic migraine without aura, not intractable, without status migrainosus: Secondary | ICD-10-CM

## 2016-05-16 DIAGNOSIS — Z9989 Dependence on other enabling machines and devices: Secondary | ICD-10-CM | POA: Diagnosis not present

## 2016-05-16 DIAGNOSIS — G4733 Obstructive sleep apnea (adult) (pediatric): Secondary | ICD-10-CM

## 2016-05-16 DIAGNOSIS — F419 Anxiety disorder, unspecified: Secondary | ICD-10-CM | POA: Diagnosis not present

## 2016-05-16 MED ORDER — SUMATRIPTAN SUCCINATE 100 MG PO TABS: ORAL_TABLET | ORAL | 2 refills | Status: DC

## 2016-05-16 NOTE — Progress Notes (Addendum)
NEUROLOGY CONSULTATION NOTE  Sharon Rivers MRN: [id number] DOB: 1970-06-28  Referring provider: [name], NP Primary care provider: [name], NP  Reason for consult:  migraines  HISTORY OF PRESENT ILLNESS: Sharon Rivers is a 46 year old female with hypertension, hypothyroidism, OSA, and anxiety who presents for migraines.  History supplemented by PCP note.  Onset:  "all of my life" but worse over the past year.  She denies new triggers. Location:  Bi-frontal/occipital Quality:  Varies from pressure to pounding Intensity:  Varies from moderate to severe Aura:  no Prodrome:  no Postdrome:  no Associated symptoms:  Nausea, vomiting, photophobia, phonophobia.  She has not had any new worse headache of her life, waking up from sleep Duration:  A couple of hours to all day.  Sometimes wakes up with them. Frequency:  Almost daily (severe occurs once a week) Frequency of abortive medication: when she takes sumatriptan, not more than 2 days out of the week) Triggers/exacerbating factors:  Menstrual cycle Relieving factors:  Laying down Activity:  Cannot function when severe.  Sometimes, she reports blurred vision.  Past NSAIDS:  Ibuprofen, naproxen Past analgesics:  Tylenol (previously helpful) Past abortive triptans:  no Past muscle relaxants:  no Past anti-emetic:  no Past antihypertensive medications:  HCTZ Past antidepressant medications:  citalopram Past anticonvulsant medications:  no Past vitamins/Herbal/Supplements:  no Other past therapies:  no  Current NSAIDS:  no Current analgesics:  Excedrin (ineffective) Current triptans:  sumatriptan 50mg  (eases intensity but not aborts) Current anti-emetic:  Zofran 4mg  Current muscle relaxants:  no Current anti-anxiolytic:  no Current sleep aide:  no Current Antihypertensive medications:  amlodipine Current Antidepressant medications:  venlafaxine XR 75mg  (started less than a month ago for anxiety),  amitriptyline 25mg  Current Anticonvulsant medications:  topiramate 50mg  (started 2.5 weeks ago for weight loss) Current Vitamins/Herbal/Supplements:  D Current Antihistamines/Decongestants:  Flonase, Claritin Other therapy:  no  Caffeine:  tea Alcohol:  no Smoker:  Former smoker Diet:  Hydrates with water.  Stopped soda Exercise:  3 to 4 days a week Depression/anxiety:  Some anxiety.  Recently started venlafaxine. Sleep hygiene:  Poor.  Recently started CPAP Family history of headache:  Mother, aunt, grandmother  CT of head from 03/25/16 was unremarkable. Recent labs:  CBC with WBC 8.7, HGB 13.1, HCT 38.2, PLT 326; BMP with Na 135, K 3.6, Cl 98, CO2 29, glucose 93, BUN 14, Cr 0.89; TSH 1.98; B12 415  PAST MEDICAL HISTORY: Past Medical History:  Diagnosis Date  . Anxiety   . Back pain   . Chest pain    a. 07/2015 Myoview: EF 71%, medium size, mild intensity, partially reversible septal defect with overlying breast attenuation -> likely artifact. No significant reversible ischemia.  . Constipation   . Depression   . Edema    feet and legs  . Frequent headaches   . GERD (gastroesophageal reflux disease)   . Hypertension   . Hypothyroidism   . Joint pain   . Migraines   . OSA (obstructive sleep apnea) 03/17/2016  . Osteoarthritis   . Panic anxiety syndrome   . SOB (shortness of breath)    a. 04/2016 Echo: EF 60-65%, Gr1 DD.  [name] Swallowing difficulty     PAST SURGICAL HISTORY: Past Surgical History:  Procedure Laterality Date  . CESAREAN SECTION  2001 & 2002  . ELBOW SURGERY    . MINOR CARPAL TUNNEL     pinched nerve in elbow  . WISDOM TOOTH EXTRACTION  MEDICATIONS: Current Outpatient Prescriptions on File Prior to Visit  Medication Sig Dispense Refill  . amLODipine (NORVASC) 10 MG tablet Take 1 tablet (10 mg total) by mouth daily. 30 tablet 5  . fluticasone (FLONASE) 50 MCG/ACT nasal spray Place 2 sprays into both nostrils daily. 16 g 0  . gabapentin (NEURONTIN)  300 MG capsule Take 300 mg by mouth 3 (three) times daily.    [name] levothyroxine (SYNTHROID, LEVOTHROID) 175 MCG tablet TAKE 1 TABLET(175 MCG) BY MOUTH DAILY BEFORE BREAKFAST 30 tablet 0  . lisinopril (PRINIVIL,ZESTRIL) 20 MG tablet Take 1 tablet (20 mg total) by mouth daily. 30 tablet 1  . loratadine (CLARITIN) 10 MG tablet Take 1 tablet (10 mg total) by mouth daily. 30 tablet 11  . metFORMIN (GLUCOPHAGE) 500 MG tablet Take 1 tablet (500 mg total) by mouth daily with breakfast. 30 tablet 0  . norethindrone (JOLIVETTE) 0.35 MG tablet Take 1 tablet (0.35 mg total) by mouth daily. 1 Package 12  . ondansetron (ZOFRAN) 4 MG tablet Take 1 tablet (4 mg total) by mouth every 8 (eight) hours as needed for nausea or vomiting. 20 tablet 0  . pantoprazole (PROTONIX) 40 MG tablet Take 1 tablet (40 mg total) by mouth daily. 30 tablet 3  . topiramate (TOPAMAX) 50 MG tablet Take 1 tablet (50 mg total) by mouth daily. Take one tab QHS 30 tablet 0  . venlafaxine XR (EFFEXOR-XR) 37.5 MG 24 hr capsule TAKE 1 CAPSULE BY MOUTH EVERY DAY FOR 3 DAYS. THEN INCREASE TO 2 CAPSULE CAPSULES DAILY 60 capsule 3  . Vitamin D, Ergocalciferol, (DRISDOL) 50000 units CAPS capsule Take 1 capsule (50,000 Units total) by mouth every 7 (seven) days. 4 capsule 0   No current facility-administered medications on file prior to visit.     ALLERGIES: Allergies  Allergen Reactions  . Adhesive [Tape] Other (See Comments)    Skin Burn.  [name] [Menthol (Topical Analgesic)] Other (See Comments)    Skin Burn.  . Sulfa Antibiotics Rash    FAMILY HISTORY: Family History  Problem Relation Age of Onset  . Hypertension Mother     Living  . Arthritis Mother   . Thyroid disease Mother   . Heart disease Mother     MI at age 57  . Heart attack Mother   . Migraines Mother   . Depression Mother   . Obesity Mother   . Hypertension Maternal Grandmother   . Parkinson's disease Maternal Grandmother   . Alzheimer's disease Maternal  Grandmother   . Cancer Maternal Grandfather   . Hypertension Maternal Grandfather   . Hypertension Paternal Grandmother   . Hypertension Paternal Grandfather   . Gout Brother   . ADD / ADHD Son     x1  . Other Son     #2-Unknown  . Diabetes Neg Hx     SOCIAL HISTORY: Social History   Social History  . Marital status: Single    Spouse name: N/A  . Number of children: N/A  . Years of education: N/A   Occupational History  . Assembler     [employer]   Social History Main Topics  . Smoking status: Former Research scientist (life sciences)  . Smokeless tobacco: Never Used     Comment: quit 1992  . Alcohol use No  . Drug use: No  . Sexual activity: Yes    Partners: Female    Birth control/ protection: Pill     Comment: OCP   Other Topics Concern  . Not  on file   Social History Narrative   She has 2 children (grown). She did not retain custody of these children.   Lives with boyfriend in [place].   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     REVIEW OF SYSTEMS: Constitutional: No fevers, chills, or sweats, no generalized fatigue, change in appetite Eyes: No visual changes, double vision, eye pain Ear, nose and throat: No hearing loss, ear pain, nasal congestion, sore throat Cardiovascular: No chest pain, palpitations Respiratory:  No shortness of breath at rest or with exertion, wheezes GastrointestinaI: No nausea, vomiting, diarrhea, abdominal pain, fecal incontinence Genitourinary:  No dysuria, urinary retention or frequency Musculoskeletal:  No neck pain, back pain Integumentary: No rash, pruritus, skin lesions Neurological: as above Psychiatric: No depression, insomnia, anxiety Endocrine: No palpitations, fatigue, diaphoresis, mood swings, change in appetite, change in weight, increased thirst Hematologic/Lymphatic:  No purpura, petechiae. Allergic/Immunologic: no itchy/runny eyes, nasal congestion, recent allergic reactions, rashes  PHYSICAL EXAM: Vitals:   05/16/16 0806  BP:  122/70  Pulse: 86   General: No acute distress.  Patient appears well-groomed.  Morbidly obese Head:  Normocephalic/atraumatic Eyes:  fundi examined but not visualized Neck: supple, no paraspinal tenderness, full range of motion Back: No paraspinal tenderness Heart: regular rate and rhythm Lungs: Clear to auscultation bilaterally. Vascular: No carotid bruits. Neurological Exam: Mental status: alert and oriented to person, place, and time, recent and remote memory intact, fund of knowledge intact, attention and concentration intact, speech fluent and not dysarthric, language intact. Cranial nerves: CN I: not tested CN II: pupils equal, round and reactive to light, visual fields intact CN III, IV, VI:  full range of motion, no nystagmus, no ptosis CN V: facial sensation intact CN VII: upper and lower face symmetric CN VIII: hearing intact CN IX, X: gag intact, uvula midline CN XI: sternocleidomastoid and trapezius muscles intact CN XII: tongue midline Bulk & Tone: normal, no fasciculations. Motor:  5/5 throughout  Sensation: temperature and vibration sensation intact. Deep Tendon Reflexes:  2+ throughout, toes downgoing. Finger to nose testing:  Without dysmetria.  Heel to shin:  Without dysmetria.  Gait:  Normal station and stride.  Able to turn and tandem walk. Romberg negative.  IMPRESSION: Chronic migraine OSA Morbid obesity (BMI 49.46) Anxiety  PLAN: 1.  Stop amitriptyline.  Continue venlafaxine and topiramate.  She is to contact me in one week and we can increase topiramate from 50mg  to 100mg  at bedtime if needed. 2.  Increase sumatriptan to 100mg  3.  Limit use of pain relievers to no more than 2 days out of the week.  These medications include acetaminophen, ibuprofen, triptans and narcotics.  This will help reduce risk of rebound headaches. 4.  Be aware of common food triggers such as processed sweets, processed foods with nitrites (such as deli meat, hot dogs,  sausages), foods with MSG, alcohol (such as wine), chocolate, certain cheeses, certain fruits (dried fruits, some citrus fruit), vinegar, diet soda. 4.  Avoid caffeine 5.  Routine exercise 6.  Proper sleep hygiene.  Use CPAP as directed. 7.  Stay adequately hydrated with water 8.  Keep a headache diary. 9.  Maintain proper stress management. 10.  Continue working on weight loss 11.  Consider supplements:  Magnesium citrate 400mg  to 600mg  daily, riboflavin 400mg , Coenzyme Q 10 100mg  three times daily 12.  Advised her to see an optometrist for formal eye exam. 13.  Follow up in 3 months.  Thank you for allowing me to take part in the  care of this patient.  Metta Clines, DO  CC:  [name], NP

## 2016-05-16 NOTE — Patient Instructions (Signed)
Migraine Recommendations: 1.  Stop amitriptyline.  Contact me in one week with update.  If headaches still frequent, we will increase the topiramate. 2.  Take sumatriptan 100mg  at earliest onset of headache.  May repeat dose once in 2 hours if needed.  Do not exceed two tablets in 24 hours. 3.  Limit use of pain relievers to no more than 2 days out of the week.  These medications include acetaminophen, ibuprofen, triptans and narcotics.  This will help reduce risk of rebound headaches. 4.  Be aware of common food triggers such as processed sweets, processed foods with nitrites (such as deli meat, hot dogs, sausages), foods with MSG, alcohol (such as wine), chocolate, certain cheeses, certain fruits (dried fruits, some citrus fruit), vinegar, diet soda. 4.  Avoid caffeine 5.  Routine exercise 6.  Proper sleep hygiene.  Use CPAP as directed. 7.  Stay adequately hydrated with water 8.  Keep a headache diary. 9.  Maintain proper stress management. 10.  Do not skip meals. 11.  Consider supplements:  Magnesium citrate 400mg  to 600mg  daily, riboflavin 400mg , Coenzyme Q 10 100mg  three times daily 12.  Please see an optometrist for formal eye exam. 13.  Follow up in 3 months.

## 2016-05-17 NOTE — Telephone Encounter (Signed)
Thyroid medication [levothyroxine] sent to pharmacy, Amitriptyline not px by PCP; and also d/c by Neurology [Dr. Jaffe] per [facility]; request Denied/SLS 04/10

## 2016-05-22 ENCOUNTER — Other Ambulatory Visit (INDEPENDENT_AMBULATORY_CARE_PROVIDER_SITE_OTHER): Payer: Self-pay | Admitting: Family Medicine

## 2016-05-22 DIAGNOSIS — F3289 Other specified depressive episodes: Secondary | ICD-10-CM

## 2016-05-23 ENCOUNTER — Encounter: Payer: Self-pay | Admitting: Neurology

## 2016-05-26 ENCOUNTER — Ambulatory Visit (INDEPENDENT_AMBULATORY_CARE_PROVIDER_SITE_OTHER): Payer: BLUE CROSS/BLUE SHIELD | Admitting: Family Medicine

## 2016-05-26 VITALS — BP 111/75 | HR 93 | Temp 98.1°F | Ht 63.0 in | Wt 274.0 lb

## 2016-05-26 DIAGNOSIS — G43809 Other migraine, not intractable, without status migrainosus: Secondary | ICD-10-CM

## 2016-05-26 MED ORDER — TOPIRAMATE 100 MG PO TABS: 100.0000 mg | ORAL_TABLET | Freq: Every day | ORAL | 0 refills | Status: DC

## 2016-05-26 NOTE — Progress Notes (Signed)
Office: [phone]  /  Fax: [phone]   HPI:   Chief Complaint: OBESITY Sharon Rivers is here to discuss her progress with her obesity treatment plan. She is following her eating plan approximately 80 % of the time and states she is walking 30 minutes 3 times per week. Sharon Rivers is doing well with weight loss, she is working on increasing lean protein for dinner and eating 2 to 3 servings of vegetables per day. Her weight is 274 lb (124.3 kg) today and has had a weight loss of 5 pounds over a period of 2 weeks since her last visit. She has lost 6 lbs since starting treatment with [name].  Migraine Headache Sharon Rivers is on Topamax, doing well. She saw her neurologist recently and he suggested we increase dose to 100 mg. [name] is taking birth control pills very regularly. LMP was approximately 2 weeks ago.  Wt Readings from Last 500 Encounters:  05/26/16 274 lb (124.3 kg)  05/16/16 279 lb 3.2 oz (126.6 kg)  05/10/16 279 lb (126.6 kg)  04/26/16 284 lb (128.8 kg)  04/21/16 291 lb 6.4 oz (132.2 kg)  04/12/16 284 lb (128.8 kg)  04/10/16 290 lb (131.5 kg)  03/31/16 290 lb 12.8 oz (131.9 kg)  03/30/16 288 lb 6.4 oz (130.8 kg)  03/25/16 289 lb 12.8 oz (131.5 kg)  03/24/16 286 lb (129.7 kg)  03/22/16 285 lb (129.3 kg)  03/14/16 286 lb (129.7 kg)  03/10/16 280 lb (127 kg)  02/23/16 295 lb 6.4 oz (134 kg)  11/12/15 282 lb (127.9 kg)  10/16/15 280 lb 8 oz (127.2 kg)  08/14/15 279 lb 6 oz (126.7 kg)  08/05/15 282 lb (127.9 kg)  07/29/15 274 lb 8 oz (124.5 kg)  07/22/15 282 lb 4 oz (128 kg)  07/14/15 279 lb (126.6 kg)  07/07/15 279 lb 11.2 oz (126.9 kg)     ALLERGIES: Allergies  Allergen Reactions  . Adhesive [Tape] Other (See Comments)    Skin Burn.  [name] [Menthol (Topical Analgesic)] Other (See Comments)    Skin Burn.  . Sulfa Antibiotics Rash    MEDICATIONS: Current Outpatient Prescriptions on File Prior to Visit  Medication Sig Dispense Refill  . amLODipine (NORVASC) 10 MG  tablet Take 1 tablet (10 mg total) by mouth daily. 30 tablet 5  . fluticasone (FLONASE) 50 MCG/ACT nasal spray Place 2 sprays into both nostrils daily. 16 g 0  . gabapentin (NEURONTIN) 300 MG capsule Take 300 mg by mouth 3 (three) times daily.    [name] levothyroxine (SYNTHROID, LEVOTHROID) 175 MCG tablet TAKE 1 TABLET(175 MCG) BY MOUTH DAILY BEFORE BREAKFAST 30 tablet 0  . lisinopril (PRINIVIL,ZESTRIL) 20 MG tablet Take 1 tablet (20 mg total) by mouth daily. 30 tablet 1  . loratadine (CLARITIN) 10 MG tablet Take 1 tablet (10 mg total) by mouth daily. 30 tablet 11  . metFORMIN (GLUCOPHAGE) 500 MG tablet Take 1 tablet (500 mg total) by mouth daily with breakfast. 30 tablet 0  . norethindrone (JOLIVETTE) 0.35 MG tablet Take 1 tablet (0.35 mg total) by mouth daily. 1 Package 12  . ondansetron (ZOFRAN) 4 MG tablet Take 1 tablet (4 mg total) by mouth every 8 (eight) hours as needed for nausea or vomiting. 20 tablet 0  . pantoprazole (PROTONIX) 40 MG tablet Take 1 tablet (40 mg total) by mouth daily. 30 tablet 3  . SUMAtriptan (IMITREX) 100 MG tablet Take 1 tablet earliest onset of migraine.  May repeat once in 2 hours if headache persists  or recurs.  Do not exceed more than 2 tablets in 24 hours 10 tablet 2  . venlafaxine XR (EFFEXOR-XR) 37.5 MG 24 hr capsule TAKE 1 CAPSULE BY MOUTH EVERY DAY FOR 3 DAYS. THEN INCREASE TO 2 CAPSULE CAPSULES DAILY 60 capsule 3  . Vitamin D, Ergocalciferol, (DRISDOL) 50000 units CAPS capsule Take 1 capsule (50,000 Units total) by mouth every 7 (seven) days. 4 capsule 0   No current facility-administered medications on file prior to visit.     PAST MEDICAL HISTORY: Past Medical History:  Diagnosis Date  . Anxiety   . Back pain   . Chest pain    a. 07/2015 Myoview: EF 71%, medium size, mild intensity, partially reversible septal defect with overlying breast attenuation -> likely artifact. No significant reversible ischemia.  . Constipation   . Depression   . Edema    feet  and legs  . Frequent headaches   . GERD (gastroesophageal reflux disease)   . Hypertension   . Hypothyroidism   . Joint pain   . Migraines   . OSA (obstructive sleep apnea) 03/17/2016  . Osteoarthritis   . Panic anxiety syndrome   . SOB (shortness of breath)    a. 04/2016 Echo: EF 60-65%, Gr1 DD.  [name] Swallowing difficulty     PAST SURGICAL HISTORY: Past Surgical History:  Procedure Laterality Date  . CESAREAN SECTION  2001 & 2002  . ELBOW SURGERY    . MINOR CARPAL TUNNEL     pinched nerve in elbow  . WISDOM TOOTH EXTRACTION      SOCIAL HISTORY: Social History  Substance Use Topics  . Smoking status: Former Research scientist (life sciences)  . Smokeless tobacco: Never Used     Comment: quit 1992  . Alcohol use No    FAMILY HISTORY: Family History  Problem Relation Age of Onset  . Hypertension Mother     Living  . Arthritis Mother   . Thyroid disease Mother   . Heart disease Mother     MI at age 58  . Heart attack Mother   . Migraines Mother   . Depression Mother   . Obesity Mother   . Hypertension Maternal Grandmother   . Parkinson's disease Maternal Grandmother   . Alzheimer's disease Maternal Grandmother   . Cancer Maternal Grandfather   . Hypertension Maternal Grandfather   . Hypertension Paternal Grandmother   . Hypertension Paternal Grandfather   . Gout Brother   . ADD / ADHD Son     x1  . Other Son     #2-Unknown  . Diabetes Neg Hx     ROS: Review of Systems  Constitutional: Positive for weight loss.  Neurological: Positive for headaches (migraine).    PHYSICAL EXAM: Blood pressure 111/75, pulse 93, temperature 98.1 F (36.7 C), temperature source Oral, height 5\' 3"  (1.6 m), weight 274 lb (124.3 kg), last menstrual period 05/02/2016, SpO2 97 %. Body mass index is 48.54 kg/m. Physical Exam  Constitutional: She is oriented to person, place, and time. She appears well-developed and well-nourished.  Cardiovascular: Normal rate.   Pulmonary/Chest: Effort normal.    Musculoskeletal: Normal range of motion.  Neurological: She is oriented to person, place, and time.  Skin: Skin is warm and dry.  Psychiatric: She has a normal mood and affect. Her behavior is normal.  Vitals reviewed.   RECENT LABS AND TESTS: BMET    Component Value Date/Time   NA 135 03/22/2016 1828   NA 136 03/10/2016 1051   K 3.6  03/22/2016 1828   CL 98 (L) 03/22/2016 1828   CO2 29 03/22/2016 1828   GLUCOSE 93 03/22/2016 1828   BUN 14 03/22/2016 1828   BUN 14 03/10/2016 1051   CREATININE 0.89 03/22/2016 1828   CALCIUM 9.0 03/22/2016 1828   GFRNONAA >60 03/22/2016 1828   GFRAA >60 03/22/2016 1828   Lab Results  Component Value Date   HGBA1C 4.8 03/10/2016   HGBA1C 4.7 07/22/2015   Lab Results  Component Value Date   INSULIN 37.1 (H) 03/10/2016   CBC    Component Value Date/Time   WBC 8.7 03/22/2016 1828   RBC 4.54 03/22/2016 1828   HGB 13.1 03/22/2016 1828   HCT 38.2 03/22/2016 1828   PLT 326 03/22/2016 1828   MCV 84.1 03/22/2016 1828   MCH 28.9 03/22/2016 1828   MCHC 34.3 03/22/2016 1828   RDW 13.6 03/22/2016 1828   LYMPHSABS 1.3 02/23/2016 1235   MONOABS 0.5 02/23/2016 1235   EOSABS 0.2 02/23/2016 1235   BASOSABS 0.0 02/23/2016 1235   Iron/TIBC/Ferritin/ %Sat No results found for: IRON, TIBC, FERRITIN, IRONPCTSAT Lipid Panel     Component Value Date/Time   CHOL 123 03/10/2016 1051   TRIG 87 03/10/2016 1051   HDL 38 (L) 03/10/2016 1051   CHOLHDL 3 07/22/2015 0847   VLDL 11.2 07/22/2015 0847   LDLCALC 68 03/10/2016 1051   Hepatic Function Panel     Component Value Date/Time   PROT 7.2 03/10/2016 1051   ALBUMIN 4.2 03/10/2016 1051   AST 13 03/10/2016 1051   ALT 17 03/10/2016 1051   ALKPHOS 73 03/10/2016 1051   BILITOT 0.4 03/10/2016 1051      Component Value Date/Time   TSH 1.98 04/21/2016 0709   TSH 0.428 (L) 03/10/2016 1051   TSH 5.19 (H) 02/23/2016 1235    ASSESSMENT AND PLAN: Other migraine without status migrainosus, not  intractable  Morbid obesity ([place])  PLAN:  Migraine Headache Michala agrees to increase Topamax to 100 mg every night #30 with no refills and follow up with our clinic in 2 weeks.  Obesity Pearlene is currently in the action stage of change. As such, her goal is to continue with weight loss efforts She has agreed to follow the Category 2 plan Citlalic has been instructed to work up to a goal of 150 minutes of combined cardio and strengthening exercise per week for weight loss and overall health benefits. We discussed the following Behavioral Modification Stratagies today: increasing lean protein intake, increasing lower sugar fruits, work on meal planning and easy cooking plans, emotional eating strategies and decrease liquid calories  Shakeyla has agreed to follow up with our clinic in 2 weeks. She was informed of the importance of frequent follow up visits to maximize her success with intensive lifestyle modifications for her multiple health conditions.  I, Doreene Nest, am acting as scribe for Dennard Nip, MD  I have reviewed the above documentation for accuracy and completeness, and I agree with the above. -Dennard Nip, MD

## 2016-05-31 ENCOUNTER — Encounter: Payer: Self-pay | Admitting: Family

## 2016-05-31 ENCOUNTER — Ambulatory Visit (INDEPENDENT_AMBULATORY_CARE_PROVIDER_SITE_OTHER): Payer: BLUE CROSS/BLUE SHIELD | Admitting: Family

## 2016-05-31 DIAGNOSIS — I10 Essential (primary) hypertension: Secondary | ICD-10-CM | POA: Diagnosis not present

## 2016-05-31 DIAGNOSIS — G43809 Other migraine, not intractable, without status migrainosus: Secondary | ICD-10-CM | POA: Diagnosis not present

## 2016-05-31 DIAGNOSIS — G4733 Obstructive sleep apnea (adult) (pediatric): Secondary | ICD-10-CM | POA: Diagnosis not present

## 2016-05-31 NOTE — Assessment & Plan Note (Signed)
Improving clinically, continue cpap. Encouraged pt to continue her weight loss efforts (working with the weight loss clinic).  Will request download report.

## 2016-05-31 NOTE — Assessment & Plan Note (Signed)
BP stable off hctz.  Monitor.

## 2016-05-31 NOTE — Progress Notes (Signed)
Subjective:    Patient ID: Sharon Rivers, female    DOB: August 21, 1970, 46 y.o.   MRN: [id number]  HPI  OSA- reports feeling better during the day. She reports good compliance with CPAP.    HTN-reports improvement in her dizziness since hctz was d/c'd.  BP Readings from Last 3 Encounters:  05/31/16 114/66  05/26/16 111/75  05/16/16 122/70   Migraine- following with neurology. Reports that topamax was increased.  Amitriptyline was d/c'd.  She reports only mild HA's, no severe migraine symptoms as before.    Review of Systems See HPI  Past Medical History:  Diagnosis Date  . Anxiety   . Back pain   . Chest pain    a. 07/2015 Myoview: EF 71%, medium size, mild intensity, partially reversible septal defect with overlying breast attenuation -> likely artifact. No significant reversible ischemia.  . Constipation   . Depression   . Edema    feet and legs  . Frequent headaches   . GERD (gastroesophageal reflux disease)   . Hypertension   . Hypothyroidism   . Joint pain   . Migraines   . OSA (obstructive sleep apnea) 03/17/2016  . Osteoarthritis   . Panic anxiety syndrome   . SOB (shortness of breath)    a. 04/2016 Echo: EF 60-65%, Gr1 DD.  [name] Swallowing difficulty      Social History   Social History  . Marital status: Single    Spouse name: N/A  . Number of children: N/A  . Years of education: N/A   Occupational History  . Assembler     [employer]   Social History Main Topics  . Smoking status: Former Research scientist (life sciences)  . Smokeless tobacco: Never Used     Comment: quit 1992  . Alcohol use No  . Drug use: No  . Sexual activity: Yes    Partners: Female    Birth control/ protection: Pill     Comment: OCP   Other Topics Concern  . Not on file   Social History Narrative   She has 2 children (grown). She did not retain custody of these children.   Lives with boyfriend in [place].   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     Past Surgical History:  Procedure  Laterality Date  . CESAREAN SECTION  2001 & 2002  . ELBOW SURGERY    . MINOR CARPAL TUNNEL     pinched nerve in elbow  . WISDOM TOOTH EXTRACTION      Family History  Problem Relation Age of Onset  . Hypertension Mother     Living  . Arthritis Mother   . Thyroid disease Mother   . Heart disease Mother     MI at age 37  . Heart attack Mother   . Migraines Mother   . Depression Mother   . Obesity Mother   . Hypertension Maternal Grandmother   . Parkinson's disease Maternal Grandmother   . Alzheimer's disease Maternal Grandmother   . Cancer Maternal Grandfather   . Hypertension Maternal Grandfather   . Hypertension Paternal Grandmother   . Hypertension Paternal Grandfather   . Gout Brother   . ADD / ADHD Son     x1  . Other Son     #2-Unknown  . Diabetes Neg Hx     Allergies  Allergen Reactions  . Adhesive [Tape] Other (See Comments)    Skin Burn.  [name] [Menthol (Topical Analgesic)] Other (See Comments)    Skin Burn.  [name]  Sulfa Antibiotics Rash    Current Outpatient Prescriptions on File Prior to Visit  Medication Sig Dispense Refill  . amLODipine (NORVASC) 10 MG tablet Take 1 tablet (10 mg total) by mouth daily. 30 tablet 5  . fluticasone (FLONASE) 50 MCG/ACT nasal spray Place 2 sprays into both nostrils daily. 16 g 0  . gabapentin (NEURONTIN) 300 MG capsule Take 300 mg by mouth 3 (three) times daily.    [name] levothyroxine (SYNTHROID, LEVOTHROID) 175 MCG tablet TAKE 1 TABLET(175 MCG) BY MOUTH DAILY BEFORE BREAKFAST 30 tablet 0  . lisinopril (PRINIVIL,ZESTRIL) 20 MG tablet Take 1 tablet (20 mg total) by mouth daily. 30 tablet 1  . loratadine (CLARITIN) 10 MG tablet Take 1 tablet (10 mg total) by mouth daily. 30 tablet 11  . metFORMIN (GLUCOPHAGE) 500 MG tablet Take 1 tablet (500 mg total) by mouth daily with breakfast. 30 tablet 0  . norethindrone (JOLIVETTE) 0.35 MG tablet Take 1 tablet (0.35 mg total) by mouth daily. 1 Package 12  . ondansetron (ZOFRAN) 4 MG  tablet Take 1 tablet (4 mg total) by mouth every 8 (eight) hours as needed for nausea or vomiting. 20 tablet 0  . pantoprazole (PROTONIX) 40 MG tablet Take 1 tablet (40 mg total) by mouth daily. 30 tablet 3  . SUMAtriptan (IMITREX) 100 MG tablet Take 1 tablet earliest onset of migraine.  May repeat once in 2 hours if headache persists or recurs.  Do not exceed more than 2 tablets in 24 hours 10 tablet 2  . topiramate (TOPAMAX) 100 MG tablet Take 1 tablet (100 mg total) by mouth at bedtime. 30 tablet 0  . venlafaxine XR (EFFEXOR-XR) 37.5 MG 24 hr capsule TAKE 1 CAPSULE BY MOUTH EVERY DAY FOR 3 DAYS. THEN INCREASE TO 2 CAPSULE CAPSULES DAILY 60 capsule 3  . Vitamin D, Ergocalciferol, (DRISDOL) 50000 units CAPS capsule Take 1 capsule (50,000 Units total) by mouth every 7 (seven) days. 4 capsule 0   No current facility-administered medications on file prior to visit.     BP 114/66 (BP Location: Right Arm, Cuff Size: Large)   Pulse 80   Temp 98.4 F (36.9 C) (Oral)   Resp 18   Ht 5\' 3"  (1.6 m)   Wt 273 lb (123.8 kg)   LMP 05/02/2016   SpO2 100% Comment: room air  BMI 48.36 kg/m       Objective:   Physical Exam  Constitutional: She is oriented to person, place, and time. She appears well-developed and well-nourished.  HENT:  Poor dentition  Cardiovascular: Normal rate, regular rhythm and normal heart sounds.   No murmur heard. Pulmonary/Chest: Effort normal and breath sounds normal. No respiratory distress. She has no wheezes.  Neurological: She is alert and oriented to person, place, and time.  Psychiatric: She has a normal mood and affect. Her behavior is normal. Judgment and thought content normal.          Assessment & Plan:

## 2016-05-31 NOTE — Assessment & Plan Note (Signed)
Stable/improved. Management per neurology.

## 2016-05-31 NOTE — Patient Instructions (Addendum)
Please schedule office visit for mole removal.  Continue to wear your cpap every night.

## 2016-05-31 NOTE — Progress Notes (Signed)
Pre visit review using our clinic review tool, if applicable. No additional management support is needed unless otherwise documented below in the visit note. 

## 2016-06-03 ENCOUNTER — Encounter: Payer: Self-pay | Admitting: Family

## 2016-06-03 ENCOUNTER — Ambulatory Visit (INDEPENDENT_AMBULATORY_CARE_PROVIDER_SITE_OTHER): Payer: BLUE CROSS/BLUE SHIELD | Admitting: Family

## 2016-06-03 ENCOUNTER — Other Ambulatory Visit (HOSPITAL_COMMUNITY)
Admission: RE | Admit: 2016-06-03 | Discharge: 2016-06-03 | Disposition: A | Discharge status: Home / Self Care | Payer: BLUE CROSS/BLUE SHIELD | Source: Ambulatory Visit | Attending: Family | Admitting: Family

## 2016-06-03 VITALS — BP 110/78 | HR 84 | Temp 98.5°F | Resp 18 | Ht 63.0 in | Wt 277.8 lb

## 2016-06-03 DIAGNOSIS — D229 Melanocytic nevi, unspecified: Secondary | ICD-10-CM | POA: Diagnosis not present

## 2016-06-03 DIAGNOSIS — L918 Other hypertrophic disorders of the skin: Secondary | ICD-10-CM

## 2016-06-03 NOTE — Progress Notes (Signed)
Pre visit review using our clinic review tool, if applicable. No additional management support is needed unless otherwise documented below in the visit note. 

## 2016-06-03 NOTE — Progress Notes (Addendum)
Subjective:    Patient ID: Sharon Rivers, female    DOB: 03/17/1970, 46 y.o.   MRN: [id number]  HPI   Sharon Rivers is a 46 yr old female who presents today for removal of 3 moles from her trunk.     Review of Systems See HPI  Past Medical History:  Diagnosis Date  . Anxiety   . Back pain   . Chest pain    a. 07/2015 Myoview: EF 71%, medium size, mild intensity, partially reversible septal defect with overlying breast attenuation -> likely artifact. No significant reversible ischemia.  . Constipation   . Depression   . Edema    feet and legs  . Frequent headaches   . GERD (gastroesophageal reflux disease)   . Hypertension   . Hypothyroidism   . Joint pain   . Migraines   . OSA (obstructive sleep apnea) 03/17/2016  . Osteoarthritis   . Panic anxiety syndrome   . SOB (shortness of breath)    a. 04/2016 Echo: EF 60-65%, Gr1 DD.  [name] Swallowing difficulty      Social History   Social History  . Marital status: Single    Spouse name: N/A  . Number of children: N/A  . Years of education: N/A   Occupational History  . Assembler     [employer]   Social History Main Topics  . Smoking status: Former Research scientist (life sciences)  . Smokeless tobacco: Never Used     Comment: quit 1992  . Alcohol use No  . Drug use: No  . Sexual activity: Yes    Partners: Female    Birth control/ protection: Pill     Comment: OCP   Other Topics Concern  . Not on file   Social History Narrative   She has 2 children (grown). She did not retain custody of these children.   Lives with boyfriend in [place].   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     Past Surgical History:  Procedure Laterality Date  . CESAREAN SECTION  2001 & 2002  . ELBOW SURGERY    . MINOR CARPAL TUNNEL     pinched nerve in elbow  . WISDOM TOOTH EXTRACTION      Family History  Problem Relation Age of Onset  . Hypertension Mother     Living  . Arthritis Mother   . Thyroid disease Mother   . Heart disease Mother     MI  at age 45  . Heart attack Mother   . Migraines Mother   . Depression Mother   . Obesity Mother   . Hypertension Maternal Grandmother   . Parkinson's disease Maternal Grandmother   . Alzheimer's disease Maternal Grandmother   . Cancer Maternal Grandfather   . Hypertension Maternal Grandfather   . Hypertension Paternal Grandmother   . Hypertension Paternal Grandfather   . Gout Brother   . ADD / ADHD Son     x1  . Other Son     #2-Unknown  . Diabetes Neg Hx     Allergies  Allergen Reactions  . Adhesive [Tape] Other (See Comments)    Skin Burn.  [name] [Menthol (Topical Analgesic)] Other (See Comments)    Skin Burn.  . Sulfa Antibiotics Rash    Current Outpatient Prescriptions on File Prior to Visit  Medication Sig Dispense Refill  . amLODipine (NORVASC) 10 MG tablet Take 1 tablet (10 mg total) by mouth daily. 30 tablet 5  . fluticasone (FLONASE) 50 MCG/ACT nasal spray Place  2 sprays into both nostrils daily. 16 g 0  . gabapentin (NEURONTIN) 300 MG capsule Take 300 mg by mouth 3 (three) times daily.    [name] levothyroxine (SYNTHROID, LEVOTHROID) 175 MCG tablet TAKE 1 TABLET(175 MCG) BY MOUTH DAILY BEFORE BREAKFAST 30 tablet 0  . lisinopril (PRINIVIL,ZESTRIL) 20 MG tablet Take 1 tablet (20 mg total) by mouth daily. 30 tablet 1  . loratadine (CLARITIN) 10 MG tablet Take 1 tablet (10 mg total) by mouth daily. 30 tablet 11  . metFORMIN (GLUCOPHAGE) 500 MG tablet Take 1 tablet (500 mg total) by mouth daily with breakfast. 30 tablet 0  . norethindrone (JOLIVETTE) 0.35 MG tablet Take 1 tablet (0.35 mg total) by mouth daily. 1 Package 12  . ondansetron (ZOFRAN) 4 MG tablet Take 1 tablet (4 mg total) by mouth every 8 (eight) hours as needed for nausea or vomiting. 20 tablet 0  . pantoprazole (PROTONIX) 40 MG tablet Take 1 tablet (40 mg total) by mouth daily. 30 tablet 3  . SUMAtriptan (IMITREX) 100 MG tablet Take 1 tablet earliest onset of migraine.  May repeat once in 2 hours if  headache persists or recurs.  Do not exceed more than 2 tablets in 24 hours 10 tablet 2  . topiramate (TOPAMAX) 100 MG tablet Take 1 tablet (100 mg total) by mouth at bedtime. 30 tablet 0  . venlafaxine XR (EFFEXOR-XR) 37.5 MG 24 hr capsule TAKE 1 CAPSULE BY MOUTH EVERY DAY FOR 3 DAYS. THEN INCREASE TO 2 CAPSULE CAPSULES DAILY 60 capsule 3  . Vitamin D, Ergocalciferol, (DRISDOL) 50000 units CAPS capsule Take 1 capsule (50,000 Units total) by mouth every 7 (seven) days. 4 capsule 0   No current facility-administered medications on file prior to visit.     BP 110/78 (BP Location: Right Arm) Comment (Cuff Size): thigh cuff  Pulse 84   Temp 98.5 F (36.9 C) (Oral)   Resp 18   Ht 5\' 3"  (1.6 m)   Wt 277 lb 12.8 oz (126 kg)   SpO2 100% Comment: room air  BMI 49.21 kg/m       Objective:   Physical Exam  #2 (left) 5 mm wide and #3 (right) 3 mm wide at base (pedunculated) both beneath the left breast     #1 near right breast 15mm wide     Assessment & Plan:  Multiple Nevi- Procedure including risks/benefits explained to patient.  Questions were answered. After informed consent was obtained, site was cleansed with betadine and then alcohol. 1% Lidocaine with epinephrine was injected under each lesion and then shave biopsy was performed. Areas were cauterized to obtain hemostasis.  Pt tolerated procedure well.  Specimen sent for pathology review.  Pt instructed to keep the area dry for 24 hours and to contact us if he develops redness, drainage or swelling at the site.  Pt may use tylenol as needed for discomfort today.

## 2016-06-03 NOTE — Patient Instructions (Signed)
Please keep area clean and dry today.  You may shower as normal tomorrow. You may use tylenol as needed for pain. Call if you develop redness/drainage.

## 2016-06-03 NOTE — Addendum Note (Signed)
Addended by: Kelle Darting A on: 06/03/2016 09:18 AM   Modules accepted: Orders

## 2016-06-13 ENCOUNTER — Ambulatory Visit (INDEPENDENT_AMBULATORY_CARE_PROVIDER_SITE_OTHER): Payer: BLUE CROSS/BLUE SHIELD | Admitting: Family Medicine

## 2016-06-13 VITALS — BP 117/84 | HR 72 | Temp 97.9°F | Ht 63.0 in | Wt 271.0 lb

## 2016-06-13 DIAGNOSIS — E8881 Metabolic syndrome: Secondary | ICD-10-CM | POA: Insufficient documentation

## 2016-06-13 DIAGNOSIS — R7303 Prediabetes: Secondary | ICD-10-CM

## 2016-06-13 DIAGNOSIS — E559 Vitamin D deficiency, unspecified: Secondary | ICD-10-CM

## 2016-06-13 DIAGNOSIS — F3289 Other specified depressive episodes: Secondary | ICD-10-CM

## 2016-06-13 MED ORDER — METFORMIN HCL 500 MG PO TABS: 500.0000 mg | ORAL_TABLET | Freq: Every day | ORAL | 0 refills | Status: DC

## 2016-06-13 MED ORDER — TOPIRAMATE 100 MG PO TABS: 100.0000 mg | ORAL_TABLET | Freq: Every day | ORAL | 0 refills | Status: DC

## 2016-06-13 MED ORDER — VITAMIN D (ERGOCALCIFEROL) 1.25 MG (50000 UNIT) PO CAPS: 50000.0000 [IU] | ORAL_CAPSULE | ORAL | 0 refills | Status: DC

## 2016-06-13 NOTE — Progress Notes (Signed)
Office: [phone]  /  Fax: [phone]   HPI:   Chief Complaint: OBESITY Sharon Rivers is here to discuss her progress with her obesity treatment plan. She is on the  keep a food journal with 350 to 550 calories and 30+ grams of protein daily and follow the Category 2 plan and is following her eating plan approximately 85 % of the time. She states she is exercising 30 minutes 3 times per week. Sharon Rivers continues to do well with weight loss. She is journaling lunch on and off and guessing some of her food. She denies significant sabotage. She has started to walk for exercise. Her weight is 271 lb (122.9 kg) today and has had a weight loss of 3 pounds over a period of 2 to 3 weeks since her last visit. She has lost 9 lbs since starting treatment with [name].  Vitamin D deficiency Sharon Rivers has a diagnosis of vitamin D deficiency. She is currently stable on vit D and denies nausea, vomiting or muscle weakness.  Pre-Diabetes Sharon Rivers has a diagnosis of prediabetes based on her elevated Hgb A1c and was informed this puts her at greater risk of developing diabetes. She is stable on metformin currently and continues to work on diet and exercise to decrease risk of diabetes. She denies nausea or hypoglycemia.  Depression with emotional eating behaviors LMP 06/07/16. Sharon Rivers notes decreased emotional eating, mood is stable on topamax. She struggles with emotional eating and using food for comfort to the extent that it is negatively impacting her health. She often snacks when she is not hungry. Sharon Rivers sometimes feels she is out of control and then feels guilty that she made poor food choices. She has been working on behavior modification techniques to help reduce her emotional eating and has been somewhat successful. She shows no sign of suicidal or homicidal ideations.  Depression screen [facility] 2/9 03/10/2016 11/12/2015  Decreased Interest 3 0  Down, Depressed, Hopeless 3 1  PHQ - 2 Score 6 1  Altered sleeping 3 -    Tired, decreased energy 3 -  Change in appetite 3 -  Feeling bad or failure about yourself  3 -  Trouble concentrating 3 -  Moving slowly or fidgety/restless 2 -  PHQ-9 Score 23 -     Wt Readings from Last 500 Encounters:  06/13/16 271 lb (122.9 kg)  06/03/16 277 lb 12.8 oz (126 kg)  05/31/16 273 lb (123.8 kg)  05/26/16 274 lb (124.3 kg)  05/16/16 279 lb 3.2 oz (126.6 kg)  05/10/16 279 lb (126.6 kg)  04/26/16 284 lb (128.8 kg)  04/21/16 291 lb 6.4 oz (132.2 kg)  04/12/16 284 lb (128.8 kg)  04/10/16 290 lb (131.5 kg)  03/31/16 290 lb 12.8 oz (131.9 kg)  03/30/16 288 lb 6.4 oz (130.8 kg)  03/25/16 289 lb 12.8 oz (131.5 kg)  03/24/16 286 lb (129.7 kg)  03/22/16 285 lb (129.3 kg)  03/14/16 286 lb (129.7 kg)  03/10/16 280 lb (127 kg)  02/23/16 295 lb 6.4 oz (134 kg)  11/12/15 282 lb (127.9 kg)  10/16/15 280 lb 8 oz (127.2 kg)  08/14/15 279 lb 6 oz (126.7 kg)  08/05/15 282 lb (127.9 kg)  07/29/15 274 lb 8 oz (124.5 kg)  07/22/15 282 lb 4 oz (128 kg)  07/14/15 279 lb (126.6 kg)  07/07/15 279 lb 11.2 oz (126.9 kg)     ALLERGIES: Allergies  Allergen Reactions  . Adhesive [Tape] Other (See Comments)    Skin Burn.  [name]  Biofreeze [Menthol (Topical Analgesic)] Other (See Comments)    Skin Burn.  . Sulfa Antibiotics Rash    MEDICATIONS: Current Outpatient Prescriptions on File Prior to Visit  Medication Sig Dispense Refill  . amLODipine (NORVASC) 10 MG tablet Take 1 tablet (10 mg total) by mouth daily. 30 tablet 5  . fluticasone (FLONASE) 50 MCG/ACT nasal spray Place 2 sprays into both nostrils daily. 16 g 0  . gabapentin (NEURONTIN) 300 MG capsule Take 300 mg by mouth 3 (three) times daily.    [name] levothyroxine (SYNTHROID, LEVOTHROID) 175 MCG tablet TAKE 1 TABLET(175 MCG) BY MOUTH DAILY BEFORE BREAKFAST 30 tablet 0  . lisinopril (PRINIVIL,ZESTRIL) 20 MG tablet Take 1 tablet (20 mg total) by mouth daily. 30 tablet 1  . loratadine (CLARITIN) 10 MG tablet Take 1 tablet (10 mg  total) by mouth daily. 30 tablet 11  . metFORMIN (GLUCOPHAGE) 500 MG tablet Take 1 tablet (500 mg total) by mouth daily with breakfast. 30 tablet 0  . norethindrone (JOLIVETTE) 0.35 MG tablet Take 1 tablet (0.35 mg total) by mouth daily. 1 Package 12  . ondansetron (ZOFRAN) 4 MG tablet Take 1 tablet (4 mg total) by mouth every 8 (eight) hours as needed for nausea or vomiting. 20 tablet 0  . pantoprazole (PROTONIX) 40 MG tablet Take 1 tablet (40 mg total) by mouth daily. 30 tablet 3  . SUMAtriptan (IMITREX) 100 MG tablet Take 1 tablet earliest onset of migraine.  May repeat once in 2 hours if headache persists or recurs.  Do not exceed more than 2 tablets in 24 hours 10 tablet 2  . topiramate (TOPAMAX) 100 MG tablet Take 1 tablet (100 mg total) by mouth at bedtime. 30 tablet 0  . venlafaxine XR (EFFEXOR-XR) 37.5 MG 24 hr capsule TAKE 1 CAPSULE BY MOUTH EVERY DAY FOR 3 DAYS. THEN INCREASE TO 2 CAPSULE CAPSULES DAILY 60 capsule 3  . Vitamin D, Ergocalciferol, (DRISDOL) 50000 units CAPS capsule Take 1 capsule (50,000 Units total) by mouth every 7 (seven) days. 4 capsule 0   No current facility-administered medications on file prior to visit.     PAST MEDICAL HISTORY: Past Medical History:  Diagnosis Date  . Anxiety   . Back pain   . Chest pain    a. 07/2015 Myoview: EF 71%, medium size, mild intensity, partially reversible septal defect with overlying breast attenuation -> likely artifact. No significant reversible ischemia.  . Constipation   . Depression   . Edema    feet and legs  . Frequent headaches   . GERD (gastroesophageal reflux disease)   . Hypertension   . Hypothyroidism   . Joint pain   . Migraines   . OSA (obstructive sleep apnea) 03/17/2016  . Osteoarthritis   . Panic anxiety syndrome   . SOB (shortness of breath)    a. 04/2016 Echo: EF 60-65%, Gr1 DD.  [name] Swallowing difficulty     PAST SURGICAL HISTORY: Past Surgical History:  Procedure Laterality Date  . CESAREAN  SECTION  2001 & 2002  . ELBOW SURGERY    . MINOR CARPAL TUNNEL     pinched nerve in elbow  . WISDOM TOOTH EXTRACTION      SOCIAL HISTORY: Social History  Substance Use Topics  . Smoking status: Former Research scientist (life sciences)  . Smokeless tobacco: Never Used     Comment: quit 1992  . Alcohol use No    FAMILY HISTORY: Family History  Problem Relation Age of Onset  . Hypertension Mother  Living  . Arthritis Mother   . Thyroid disease Mother   . Heart disease Mother     MI at age 63  . Heart attack Mother   . Migraines Mother   . Depression Mother   . Obesity Mother   . Hypertension Maternal Grandmother   . Parkinson's disease Maternal Grandmother   . Alzheimer's disease Maternal Grandmother   . Cancer Maternal Grandfather   . Hypertension Maternal Grandfather   . Hypertension Paternal Grandmother   . Hypertension Paternal Grandfather   . Gout Brother   . ADD / ADHD Son     x1  . Other Son     #2-Unknown  . Diabetes Neg Hx     ROS: Review of Systems  Constitutional: Positive for weight loss.  Gastrointestinal: Negative for nausea and vomiting.  Musculoskeletal:       Negative muscle weakness  Endo/Heme/Allergies:       Negative hypoglycemia  Psychiatric/Behavioral: Positive for depression. Negative for suicidal ideas.    PHYSICAL EXAM: Blood pressure 117/84, pulse 72, temperature 97.9 F (36.6 C), temperature source Oral, height 5\' 3"  (1.6 m), weight 271 lb (122.9 kg), last menstrual period 06/07/2016, SpO2 99 %. Body mass index is 48.01 kg/m. Physical Exam  Constitutional: She is oriented to person, place, and time. She appears well-developed and well-nourished.  Cardiovascular: Normal rate.   Pulmonary/Chest: Effort normal.  Musculoskeletal: Normal range of motion.  Neurological: She is oriented to person, place, and time.  Skin: Skin is warm and dry.  Psychiatric: She has a normal mood and affect. Her behavior is normal.  Vitals reviewed.   RECENT LABS AND  TESTS: BMET    Component Value Date/Time   NA 135 03/22/2016 1828   NA 136 03/10/2016 1051   K 3.6 03/22/2016 1828   CL 98 (L) 03/22/2016 1828   CO2 29 03/22/2016 1828   GLUCOSE 93 03/22/2016 1828   BUN 14 03/22/2016 1828   BUN 14 03/10/2016 1051   CREATININE 0.89 03/22/2016 1828   CALCIUM 9.0 03/22/2016 1828   GFRNONAA >60 03/22/2016 1828   GFRAA >60 03/22/2016 1828   Lab Results  Component Value Date   HGBA1C 4.8 03/10/2016   HGBA1C 4.7 07/22/2015   Lab Results  Component Value Date   INSULIN 37.1 (H) 03/10/2016   CBC    Component Value Date/Time   WBC 8.7 03/22/2016 1828   RBC 4.54 03/22/2016 1828   HGB 13.1 03/22/2016 1828   HCT 38.2 03/22/2016 1828   PLT 326 03/22/2016 1828   MCV 84.1 03/22/2016 1828   MCH 28.9 03/22/2016 1828   MCHC 34.3 03/22/2016 1828   RDW 13.6 03/22/2016 1828   LYMPHSABS 1.3 02/23/2016 1235   MONOABS 0.5 02/23/2016 1235   EOSABS 0.2 02/23/2016 1235   BASOSABS 0.0 02/23/2016 1235   Iron/TIBC/Ferritin/ %Sat No results found for: IRON, TIBC, FERRITIN, IRONPCTSAT Lipid Panel     Component Value Date/Time   CHOL 123 03/10/2016 1051   TRIG 87 03/10/2016 1051   HDL 38 (L) 03/10/2016 1051   CHOLHDL 3 07/22/2015 0847   VLDL 11.2 07/22/2015 0847   LDLCALC 68 03/10/2016 1051   Hepatic Function Panel     Component Value Date/Time   PROT 7.2 03/10/2016 1051   ALBUMIN 4.2 03/10/2016 1051   AST 13 03/10/2016 1051   ALT 17 03/10/2016 1051   ALKPHOS 73 03/10/2016 1051   BILITOT 0.4 03/10/2016 1051      Component Value Date/Time   TSH  1.98 04/21/2016 0709   TSH 0.428 (L) 03/10/2016 1051   TSH 5.19 (H) 02/23/2016 1235    ASSESSMENT AND PLAN: Prediabetes  Vitamin D deficiency - Plan: Vitamin D, Ergocalciferol, (DRISDOL) 50000 units CAPS capsule  Other depression - Plan: topiramate (TOPAMAX) 100 MG tablet  Insulin resistance - Plan: metFORMIN (GLUCOPHAGE) 500 MG tablet  Morbid obesity (HCC) - BMI 48.1  PLAN:  Vitamin D  Deficiency Sharon Rivers was informed that low vitamin D levels contributes to fatigue and are associated with obesity, breast, and colon cancer. She agrees to continue to take prescription Vit D @50 ,000 IU every week, we will refill for 1 month and will follow up for routine testing of vitamin D, at least 2-3 times per year. She was informed of the risk of over-replacement of vitamin D and agrees to not increase her dose unless he discusses this with [name] first. Sharon Rivers agrees to follow up with our clinic in 2 to 3 weeks.  Pre-Diabetes Sharon Rivers will continue to work on weight loss, exercise, and decreasing simple carbohydrates in her diet to help decrease the risk of diabetes. We dicussed metformin including benefits and risks. She was informed that eating too many simple carbohydrates or too many calories at one sitting increases the likelihood of GI side effects. Sharon Rivers requested metformin for now and a prescription was written today for 1 month refill. Sharon Rivers agreed to follow up with [name] as directed to monitor her progress.  Depression with Emotional Eating Behaviors We discussed behavior modification techniques today to help Sharon Rivers deal with her emotional eating and depression. She has agreed to continue to take Topamax 100 mg qd #30 with no refills and agreed to follow up as directed.  Obesity Sharon Rivers is currently in the action stage of change. As such, her goal is to continue with weight loss efforts She has agreed to keep a food journal with 350to 550 calories and 30+ grams of protein at lunch daily and follow the Category 2 plan Sharon Rivers has been instructed to work up to a goal of 150 minutes of combined cardio and strengthening exercise per week for weight loss and overall health benefits. We discussed the following Behavioral Modification Stratagies today: increasing lean protein intake, decreasing simple carbohydrates  and ways to avoid night time snacking  Sharon Rivers has agreed to follow up with our clinic  in 2 to 3 weeks. She was informed of the importance of frequent follow up visits to maximize her success with intensive lifestyle modifications for her multiple health conditions.  I, Doreene Nest, am acting as scribe for Dennard Nip, MD  I have reviewed the above documentation for accuracy and completeness, and I agree with the above. -Dennard Nip, MD

## 2016-06-14 ENCOUNTER — Other Ambulatory Visit: Payer: Self-pay | Admitting: Family

## 2016-06-15 ENCOUNTER — Other Ambulatory Visit: Payer: Self-pay | Admitting: Physician Assistant

## 2016-06-15 DIAGNOSIS — I10 Essential (primary) hypertension: Secondary | ICD-10-CM

## 2016-06-19 ENCOUNTER — Other Ambulatory Visit: Payer: Self-pay | Admitting: Physician Assistant

## 2016-06-19 DIAGNOSIS — I10 Essential (primary) hypertension: Secondary | ICD-10-CM

## 2016-06-21 ENCOUNTER — Encounter (INDEPENDENT_AMBULATORY_CARE_PROVIDER_SITE_OTHER): Payer: Self-pay | Admitting: Family Medicine

## 2016-06-21 NOTE — Telephone Encounter (Signed)
Please schedule

## 2016-06-26 ENCOUNTER — Encounter (INDEPENDENT_AMBULATORY_CARE_PROVIDER_SITE_OTHER): Payer: Self-pay | Admitting: Family Medicine

## 2016-06-27 ENCOUNTER — Encounter: Payer: Self-pay | Admitting: Family

## 2016-06-27 ENCOUNTER — Ambulatory Visit (INDEPENDENT_AMBULATORY_CARE_PROVIDER_SITE_OTHER): Payer: BLUE CROSS/BLUE SHIELD | Admitting: Family

## 2016-06-27 VITALS — BP 128/86 | HR 78 | Temp 98.7°F | Resp 18 | Ht 63.0 in | Wt 272.2 lb

## 2016-06-27 DIAGNOSIS — K529 Noninfective gastroenteritis and colitis, unspecified: Secondary | ICD-10-CM | POA: Diagnosis not present

## 2016-06-27 DIAGNOSIS — R350 Frequency of micturition: Secondary | ICD-10-CM

## 2016-06-27 LAB — POC URINALSYSI DIPSTICK (AUTOMATED)
BILIRUBIN UA: NEGATIVE
Blood, UA: NEGATIVE
GLUCOSE UA: NEGATIVE
Ketones, UA: NEGATIVE
LEUKOCYTES UA: NEGATIVE
NITRITE UA: NEGATIVE
Protein, UA: NEGATIVE
Spec Grav, UA: >=1.03 — AB (ref 1.010–1.025)
UROBILINOGEN UA: 0.2 U/dL
pH, UA: 6 (ref 5.0–8.0)

## 2016-06-27 MED ORDER — ONDANSETRON HCL 4 MG/2ML IJ SOLN
4.0000 mg | Freq: Once | INTRAMUSCULAR | Status: AC
  Administered 2016-06-27: 4 mg via INTRAMUSCULAR

## 2016-06-27 NOTE — Patient Instructions (Signed)
Continue zofran as needed for nausea. You may add imodium as needed for diarrhea.  Continue to drink plenty of fluids. If you are unable to keep down adequate fluids, or if you develop increased weakness please proceed to the ED. Complete lab work prior to leaving.

## 2016-06-27 NOTE — Progress Notes (Signed)
Subjective:    Patient ID: Sharon Rivers, female    DOB: January 26, 1971, 46 y.o.   MRN: [id number]  HPI  Sharon Rivers is a 46 yr old female who presents today with chief complaint of nausea/vomitting.   Reports that 2 weeks ago she had episode of vomiting. Took zofran with improvement.    Reports that she has been eating crackers.  Has abdominal cramping. Symptoms started yesterday after breakfast.  Notes + loose stools <5 today. Went to work but vomited several times at work. Felt hot.  Last dose of zofran was this AM.  Denies dysuria.  + urinary frequency.  Had some mild low back pain.    Review of Systems See HPI  Past Medical History:  Diagnosis Date  . Anxiety   . Back pain   . Chest pain    a. 07/2015 Myoview: EF 71%, medium size, mild intensity, partially reversible septal defect with overlying breast attenuation -> likely artifact. No significant reversible ischemia.  . Constipation   . Depression   . Edema    feet and legs  . Frequent headaches   . GERD (gastroesophageal reflux disease)   . Hypertension   . Hypothyroidism   . Joint pain   . Migraines   . OSA (obstructive sleep apnea) 03/17/2016  . Osteoarthritis   . Panic anxiety syndrome   . SOB (shortness of breath)    a. 04/2016 Echo: EF 60-65%, Gr1 DD.  [name] Swallowing difficulty      Social History   Social History  . Marital status: Single    Spouse name: N/A  . Number of children: N/A  . Years of education: N/A   Occupational History  . Assembler     Sharon Rivers   Social History Main Topics  . Smoking status: Former Research scientist (life sciences)  . Smokeless tobacco: Never Used     Comment: quit 1992  . Alcohol use No  . Drug use: No  . Sexual activity: Yes    Partners: Female    Birth control/ protection: Pill     Comment: OCP   Other Topics Concern  . Not on file   Social History Narrative   She has 2 children (grown). She did not retain custody of these children.   Lives with boyfriend in [place].   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     Past Surgical History:  Procedure Laterality Date  . CESAREAN SECTION  2001 & 2002  . ELBOW SURGERY    . MINOR CARPAL TUNNEL     pinched nerve in elbow  . WISDOM TOOTH EXTRACTION      Family History  Problem Relation Age of Onset  . Hypertension Mother        Living  . Arthritis Mother   . Thyroid disease Mother   . Heart disease Mother        MI at age 9  . Heart attack Mother   . Migraines Mother   . Depression Mother   . Obesity Mother   . Hypertension Maternal Grandmother   . Parkinson's disease Maternal Grandmother   . Alzheimer's disease Maternal Grandmother   . Cancer Maternal Grandfather   . Hypertension Maternal Grandfather   . Hypertension Paternal Grandmother   . Hypertension Paternal Grandfather   . Gout Brother   . ADD / ADHD Son        x1  . Other Son        #2-Unknown  . Diabetes Neg Hx  Allergies  Allergen Reactions  . Adhesive [Tape] Other (See Comments)    Skin Burn.  [name] [Menthol (Topical Analgesic)] Other (See Comments)    Skin Burn.  . Sulfa Antibiotics Rash    Current Outpatient Prescriptions on File Prior to Visit  Medication Sig Dispense Refill  . amLODipine (NORVASC) 10 MG tablet Take 1 tablet (10 mg total) by mouth daily. 30 tablet 5  . fluticasone (FLONASE) 50 MCG/ACT nasal spray Place 2 sprays into both nostrils daily. 16 g 0  . gabapentin (NEURONTIN) 300 MG capsule Take 300 mg by mouth 3 (three) times daily.    [name] levothyroxine (SYNTHROID, LEVOTHROID) 175 MCG tablet TAKE 1 TABLET(175 MCG) BY MOUTH DAILY BEFORE BREAKFAST 30 tablet 5  . lisinopril (PRINIVIL,ZESTRIL) 20 MG tablet TAKE 1 TABLET(20 MG) BY MOUTH DAILY 30 tablet 2  . loratadine (CLARITIN) 10 MG tablet Take 1 tablet (10 mg total) by mouth daily. 30 tablet 11  . metFORMIN (GLUCOPHAGE) 500 MG tablet Take 1 tablet (500 mg total) by mouth daily with breakfast. 30 tablet 0  . norethindrone (JOLIVETTE) 0.35 MG tablet Take  1 tablet (0.35 mg total) by mouth daily. 1 Package 12  . ondansetron (ZOFRAN) 4 MG tablet Take 1 tablet (4 mg total) by mouth every 8 (eight) hours as needed for nausea or vomiting. 20 tablet 0  . pantoprazole (PROTONIX) 40 MG tablet TAKE 1 TABLET(40 MG) BY MOUTH DAILY 30 tablet 5  . SUMAtriptan (IMITREX) 100 MG tablet Take 1 tablet earliest onset of migraine.  May repeat once in 2 hours if headache persists or recurs.  Do not exceed more than 2 tablets in 24 hours 10 tablet 2  . topiramate (TOPAMAX) 100 MG tablet Take 1 tablet (100 mg total) by mouth at bedtime. 30 tablet 0  . venlafaxine XR (EFFEXOR-XR) 37.5 MG 24 hr capsule TAKE 1 CAPSULE BY MOUTH EVERY DAY FOR 3 DAYS. THEN INCREASE TO 2 CAPSULE CAPSULES DAILY 60 capsule 3  . Vitamin D, Ergocalciferol, (DRISDOL) 50000 units CAPS capsule Take 1 capsule (50,000 Units total) by mouth every 7 (seven) days. 4 capsule 0   No current facility-administered medications on file prior to visit.     BP 128/86 (BP Location: Left Arm, Cuff Size: Large)   Pulse 78   Temp 98.7 F (37.1 C) (Oral)   Resp 18   Ht 5\' 3"  (1.6 m)   Wt 272 lb 3.2 oz (123.5 kg)   LMP 06/07/2016   SpO2 100%   BMI 48.22 kg/m       Objective:   Physical Exam  Constitutional: She appears well-developed and well-nourished.  Cardiovascular: Normal rate, regular rhythm and normal heart sounds.   No murmur heard. Pulmonary/Chest: Effort normal and breath sounds normal. No respiratory distress. She has no wheezes.  Abdominal: Soft. Bowel sounds are normal. She exhibits no distension.  Mild diffuse abdominal tenderness without guarding.   Psychiatric: She has a normal mood and affect. Her behavior is normal. Judgment and thought content normal.          Assessment & Plan:  Viral Gastroenteritis- symptoms most consistent with viral gastroenteritis. Urinalysis is unremarkable, will send for culture to exclude UTI.   Obtain CMET, CBC.  Advised pt as follows: Continue zofran  as needed for nausea. add imodium as needed for diarrhea.   Continue to drink plenty of fluids. If you are unable to keep down adequate fluids, or if you develop increased weakness please proceed to the ED.

## 2016-06-28 ENCOUNTER — Encounter: Payer: Self-pay | Admitting: Family

## 2016-06-28 LAB — COMPREHENSIVE METABOLIC PANEL
ALBUMIN: 4.1 g/dL (ref 3.5–5.2)
ALK PHOS: 49 U/L (ref 39–117)
ALT: 15 U/L (ref 0–35)
AST: 11 U/L (ref 0–37)
BILIRUBIN TOTAL: 0.3 mg/dL (ref 0.2–1.2)
BUN: 11 mg/dL (ref 6–23)
CALCIUM: 9.3 mg/dL (ref 8.4–10.5)
CO2: 25 meq/L (ref 19–32)
CREATININE: 0.77 mg/dL (ref 0.40–1.20)
Chloride: 108 mEq/L (ref 96–112)
GFR: 86.33 mL/min (ref >60.00)
Glucose, Bld: 77 mg/dL (ref 70–99)
Potassium: 3.5 mEq/L (ref 3.5–5.1)
Sodium: 140 mEq/L (ref 135–145)
TOTAL PROTEIN: 7.1 g/dL (ref 6.0–8.3)

## 2016-06-28 LAB — CBC WITH DIFFERENTIAL/PLATELET
BASOS ABS: 0.1 10*3/uL (ref 0.0–0.1)
Basophils Relative: 1.3 % (ref 0.0–3.0)
EOS ABS: 0.2 10*3/uL (ref 0.0–0.7)
Eosinophils Relative: 2.8 % (ref 0.0–5.0)
HEMATOCRIT: 36.4 % (ref 36.0–46.0)
Hemoglobin: 12.4 g/dL (ref 12.0–15.0)
LYMPHS PCT: 23.3 % (ref 12.0–46.0)
Lymphs Abs: 1.7 10*3/uL (ref 0.7–4.0)
MCHC: 34.2 g/dL (ref 30.0–36.0)
MCV: 85.1 fl (ref 78.0–100.0)
MONOS PCT: 8.9 % (ref 3.0–12.0)
Monocytes Absolute: 0.6 10*3/uL (ref 0.1–1.0)
NEUTROS ABS: 4.5 10*3/uL (ref 1.4–7.7)
NEUTROS PCT: 63.7 % (ref 43.0–77.0)
PLATELETS: 356 10*3/uL (ref 150.0–400.0)
RBC: 4.27 Mil/uL (ref 3.87–5.11)
RDW: 14 % (ref 11.5–15.5)
WBC: 7.1 10*3/uL (ref 4.0–10.5)

## 2016-06-28 LAB — URINE CULTURE

## 2016-06-28 NOTE — Telephone Encounter (Signed)
See additional email from later today.

## 2016-06-29 ENCOUNTER — Encounter: Payer: Self-pay | Admitting: Family

## 2016-06-30 ENCOUNTER — Ambulatory Visit (INDEPENDENT_AMBULATORY_CARE_PROVIDER_SITE_OTHER): Payer: BLUE CROSS/BLUE SHIELD | Admitting: Family Medicine

## 2016-06-30 VITALS — BP 112/74 | HR 78 | Temp 98.4°F | Ht 63.0 in | Wt 267.0 lb

## 2016-06-30 DIAGNOSIS — F3289 Other specified depressive episodes: Secondary | ICD-10-CM | POA: Diagnosis not present

## 2016-06-30 DIAGNOSIS — R7303 Prediabetes: Secondary | ICD-10-CM | POA: Diagnosis not present

## 2016-07-05 ENCOUNTER — Encounter: Payer: Self-pay | Admitting: Family

## 2016-07-05 ENCOUNTER — Ambulatory Visit (INDEPENDENT_AMBULATORY_CARE_PROVIDER_SITE_OTHER): Payer: BLUE CROSS/BLUE SHIELD | Admitting: Family

## 2016-07-05 VITALS — BP 113/56 | HR 76 | Temp 98.4°F | Resp 20 | Ht 63.0 in | Wt 271.2 lb

## 2016-07-05 DIAGNOSIS — M5417 Radiculopathy, lumbosacral region: Secondary | ICD-10-CM | POA: Diagnosis not present

## 2016-07-05 DIAGNOSIS — M5416 Radiculopathy, lumbar region: Secondary | ICD-10-CM

## 2016-07-05 MED ORDER — HYDROCODONE-ACETAMINOPHEN 5-300 MG PO TABS: 1.0000 | ORAL_TABLET | Freq: Four times a day (QID) | ORAL | 0 refills | Status: DC | PRN

## 2016-07-05 MED ORDER — METHYLPREDNISOLONE 4 MG PO TBPK
ORAL_TABLET | ORAL | 0 refills | Status: AC
Start: 2016-07-05 — End: 2016-07-13

## 2016-07-05 MED ORDER — CYCLOBENZAPRINE HCL 5 MG PO TABS: 5.0000 mg | ORAL_TABLET | Freq: Three times a day (TID) | ORAL | 0 refills | Status: DC | PRN

## 2016-07-05 NOTE — Progress Notes (Signed)
Subjective:    Patient ID: Sharon Rivers, female    DOB: [birth year], 46 y.o.   MRN: [id number]  HPI  Sharon Rivers is a 46 yr old female who presents today with chief complaint of back pain.  Pain is located in the left lower back and radiates down the left leg.  Reports previous hx of similar pain. Pain began immediately after bending over to feed the dog yesterday.   Using ice/heat without much improvement. Pain is radiating into the left buttock and down the back ofher leg.  Needed help getting her shoes on today. Denies bowel/bladder incontinence.  Using motrin/aleve without improvement I her pain.   Review of Systems See HPI  Past Medical History:  Diagnosis Date  . Anxiety   . Back pain   . Chest pain    a. 07/2015 Myoview: EF 71%, medium size, mild intensity, partially reversible septal defect with overlying breast attenuation -> likely artifact. No significant reversible ischemia.  . Constipation   . Depression   . Edema    feet and legs  . Frequent headaches   . GERD (gastroesophageal reflux disease)   . Hypertension   . Hypothyroidism   . Joint pain   . Migraines   . OSA (obstructive sleep apnea) 03/17/2016  . Osteoarthritis   . Panic anxiety syndrome   . SOB (shortness of breath)    a. 04/2016 Echo: EF 60-65%, Gr1 DD.  [name] Swallowing difficulty      Social History   Social History  . Marital status: Single    Spouse name: N/A  . Number of children: N/A  . Years of education: N/A   Occupational History  . Assembler     Sharon Rivers   Social History Main Topics  . Smoking status: Former Research scientist (life sciences)  . Smokeless tobacco: Never Used     Comment: quit 1992  . Alcohol use No  . Drug use: No  . Sexual activity: Yes    Partners: Female    Birth control/ protection: Pill     Comment: OCP   Other Topics Concern  . Not on file   Social History Narrative   She has 2 children (grown). She did not retain custody of these children.   Lives with boyfriend in [place].    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     Past Surgical History:  Procedure Laterality Date  . CESAREAN SECTION  2001 & 2002  . ELBOW SURGERY    . MINOR CARPAL TUNNEL     pinched nerve in elbow  . WISDOM TOOTH EXTRACTION      Family History  Problem Relation Age of Onset  . Hypertension Mother        Living  . Arthritis Mother   . Thyroid disease Mother   . Heart disease Mother        MI at age 72  . Heart attack Mother   . Migraines Mother   . Depression Mother   . Obesity Mother   . Hypertension Maternal Grandmother   . Parkinson's disease Maternal Grandmother   . Alzheimer's disease Maternal Grandmother   . Cancer Maternal Grandfather   . Hypertension Maternal Grandfather   . Hypertension Paternal Grandmother   . Hypertension Paternal Grandfather   . Gout Brother   . ADD / ADHD Son        x1  . Other Son        #2-Unknown  . Diabetes Neg Hx  Allergies  Allergen Reactions  . Adhesive [Tape] Other (See Comments)    Skin Burn.  [name] [Menthol (Topical Analgesic)] Other (See Comments)    Skin Burn.  . Sulfa Antibiotics Rash    Current Outpatient Prescriptions on File Prior to Visit  Medication Sig Dispense Refill  . amLODipine (NORVASC) 10 MG tablet Take 1 tablet (10 mg total) by mouth daily. 30 tablet 5  . fluticasone (FLONASE) 50 MCG/ACT nasal spray Place 2 sprays into both nostrils daily. 16 g 0  . gabapentin (NEURONTIN) 300 MG capsule Take 300 mg by mouth 3 (three) times daily.    [name] levothyroxine (SYNTHROID, LEVOTHROID) 175 MCG tablet TAKE 1 TABLET(175 MCG) BY MOUTH DAILY BEFORE BREAKFAST 30 tablet 5  . lisinopril (PRINIVIL,ZESTRIL) 20 MG tablet TAKE 1 TABLET(20 MG) BY MOUTH DAILY 30 tablet 2  . loratadine (CLARITIN) 10 MG tablet Take 1 tablet (10 mg total) by mouth daily. 30 tablet 11  . metFORMIN (GLUCOPHAGE) 500 MG tablet Take 1 tablet (500 mg total) by mouth daily with breakfast. 30 tablet 0  . norethindrone (JOLIVETTE) 0.35 MG tablet  Take 1 tablet (0.35 mg total) by mouth daily. 1 Package 12  . ondansetron (ZOFRAN) 4 MG tablet Take 1 tablet (4 mg total) by mouth every 8 (eight) hours as needed for nausea or vomiting. 20 tablet 0  . pantoprazole (PROTONIX) 40 MG tablet TAKE 1 TABLET(40 MG) BY MOUTH DAILY 30 tablet 5  . SUMAtriptan (IMITREX) 100 MG tablet Take 1 tablet earliest onset of migraine.  May repeat once in 2 hours if headache persists or recurs.  Do not exceed more than 2 tablets in 24 hours 10 tablet 2  . topiramate (TOPAMAX) 100 MG tablet Take 1 tablet (100 mg total) by mouth at bedtime. 30 tablet 0  . venlafaxine XR (EFFEXOR-XR) 37.5 MG 24 hr capsule TAKE 1 CAPSULE BY MOUTH EVERY DAY FOR 3 DAYS. THEN INCREASE TO 2 CAPSULE CAPSULES DAILY 60 capsule 3  . Vitamin D, Ergocalciferol, (DRISDOL) 50000 units CAPS capsule Take 1 capsule (50,000 Units total) by mouth every 7 (seven) days. 4 capsule 0   No current facility-administered medications on file prior to visit.     BP (!) 113/56 (BP Location: Left Arm, Cuff Size: Large)   Pulse 76   Temp 98.4 F (36.9 C) (Oral)   Resp 20   Ht 5\' 3"  (1.6 m)   Wt 271 lb 3.2 oz (123 kg)   LMP 06/07/2016   SpO2 100%   BMI 48.04 kg/m       Objective:   Physical Exam  Constitutional: She is oriented to person, place, and time. She appears well-developed and well-nourished.  HENT:  Head: Normocephalic and atraumatic.  Cardiovascular: Normal rate, regular rhythm and normal heart sounds.   No murmur heard. Pulmonary/Chest: Effort normal and breath sounds normal. No respiratory distress. She has no wheezes.  Musculoskeletal: She exhibits no edema.       Cervical back: She exhibits no tenderness.       Thoracic back: She exhibits no tenderness.       Lumbar back: She exhibits tenderness.  Bilateral LE strength is 5/5  Neurological: She is alert and oriented to person, place, and time.  Psychiatric: She has a normal mood and affect. Her behavior is normal. Judgment and  thought content normal.          Assessment & Plan:  Lumbar back pain with radiculopathy- rx with medrol dose pak, flexeril prn, Sparing use  of short term hydrocodone for severe back pain ( [place] controlled substance registry is reviewed).  Pt is advised to call if new/worsening symptoms or if symptoms do not improve.

## 2016-07-05 NOTE — Patient Instructions (Signed)
Please begin medrol dose pak for your back. You may use flexeril and hydrocodone as needed for pain.  Do not drive after taking these medications. Call if symptoms worsen or if not improved in 3 days.

## 2016-07-05 NOTE — Progress Notes (Signed)
Office: [phone]  /  Fax: [phone]   HPI:   Chief Complaint: OBESITY Sharon Rivers is here to discuss her progress with her obesity treatment plan. She is on the  keep a food journal with 350 to 550 calories and 30+ grams of protein  and follow the Category 2 plan and is following her eating plan approximately 85 % of the time. She states she is walking at work 30 minutes 3 times per week. Sharon Rivers continues to do well with weight loss on category 2 and dinner journaling. She is doing better planning meals ahead of time and making better choices. She has started exercising and energy is improved. Her weight is 267 lb (121.1 kg) today and has had a weight loss of 4 pounds over a period of 2 weeks since her last visit. She has lost 13 lbs since starting treatment with [name].  Pre-Diabetes Sharon Rivers has a diagnosis of pre-diabetes based on her elevated Hgb A1c and was informed this puts her at greater risk of developing diabetes. She is stable on  metformin currently and continues to work on decreasing simple carbohydrates, diet and exercise to decrease risk of diabetes. She denies nausea, vomiting or hypoglycemia.  Depression with emotional eating behaviors Sharon Rivers started on Topamax and feels she is more in control of her snacking and is doing better with planing meals ahead of time. Her mood is stable and she notes increased energy. She only sleeps 5 to 6 hours per night but wakes up refreshed. Sharon Rivers struggles with emotional eating and using food for comfort to the extent that it is negatively impacting her health. She often snacks when she is not hungry. Sharon Rivers sometimes feels she is out of control and then feels guilty that she made poor food choices. She has been working on behavior modification techniques to help reduce her emotional eating and has been somewhat successful. She shows no sign of suicidal or homicidal ideations.  Depression screen [facility] 2/9 03/10/2016 11/12/2015  Decreased Interest 3 0    Down, Depressed, Hopeless 3 1  PHQ - 2 Score 6 1  Altered sleeping 3 -  Tired, decreased energy 3 -  Change in appetite 3 -  Feeling bad or failure about yourself  3 -  Trouble concentrating 3 -  Moving slowly or fidgety/restless 2 -  PHQ-9 Score 23 -      ALLERGIES: Allergies  Allergen Reactions  . Adhesive [Tape] Other (See Comments)    Skin Burn.  Sharon Rivers [Menthol (Topical Analgesic)] Other (See Comments)    Skin Burn.  . Sulfa Antibiotics Rash    MEDICATIONS: Current Outpatient Prescriptions on File Prior to Visit  Medication Sig Dispense Refill  . amLODipine (NORVASC) 10 MG tablet Take 1 tablet (10 mg total) by mouth daily. 30 tablet 5  . fluticasone (FLONASE) 50 MCG/ACT nasal spray Place 2 sprays into both nostrils daily. 16 g 0  . gabapentin (NEURONTIN) 300 MG capsule Take 300 mg by mouth 3 (three) times daily.    [name] levothyroxine (SYNTHROID, LEVOTHROID) 175 MCG tablet TAKE 1 TABLET(175 MCG) BY MOUTH DAILY BEFORE BREAKFAST 30 tablet 5  . lisinopril (PRINIVIL,ZESTRIL) 20 MG tablet TAKE 1 TABLET(20 MG) BY MOUTH DAILY 30 tablet 2  . loratadine (CLARITIN) 10 MG tablet Take 1 tablet (10 mg total) by mouth daily. 30 tablet 11  . metFORMIN (GLUCOPHAGE) 500 MG tablet Take 1 tablet (500 mg total) by mouth daily with breakfast. 30 tablet 0  . norethindrone (JOLIVETTE) 0.35 MG tablet  Take 1 tablet (0.35 mg total) by mouth daily. 1 Package 12  . ondansetron (ZOFRAN) 4 MG tablet Take 1 tablet (4 mg total) by mouth every 8 (eight) hours as needed for nausea or vomiting. 20 tablet 0  . pantoprazole (PROTONIX) 40 MG tablet TAKE 1 TABLET(40 MG) BY MOUTH DAILY 30 tablet 5  . SUMAtriptan (IMITREX) 100 MG tablet Take 1 tablet earliest onset of migraine.  May repeat once in 2 hours if headache persists or recurs.  Do not exceed more than 2 tablets in 24 hours 10 tablet 2  . topiramate (TOPAMAX) 100 MG tablet Take 1 tablet (100 mg total) by mouth at bedtime. 30 tablet 0  . venlafaxine XR  (EFFEXOR-XR) 37.5 MG 24 hr capsule TAKE 1 CAPSULE BY MOUTH EVERY DAY FOR 3 DAYS. THEN INCREASE TO 2 CAPSULE CAPSULES DAILY 60 capsule 3  . Vitamin D, Ergocalciferol, (DRISDOL) 50000 units CAPS capsule Take 1 capsule (50,000 Units total) by mouth every 7 (seven) days. 4 capsule 0   No current facility-administered medications on file prior to visit.     PAST MEDICAL HISTORY: Past Medical History:  Diagnosis Date  . Anxiety   . Back pain   . Chest pain    a. 07/2015 Myoview: EF 71%, medium size, mild intensity, partially reversible septal defect with overlying breast attenuation -> likely artifact. No significant reversible ischemia.  . Constipation   . Depression   . Edema    feet and legs  . Frequent headaches   . GERD (gastroesophageal reflux disease)   . Hypertension   . Hypothyroidism   . Joint pain   . Migraines   . OSA (obstructive sleep apnea) 03/17/2016  . Osteoarthritis   . Panic anxiety syndrome   . SOB (shortness of breath)    a. 04/2016 Echo: EF 60-65%, Gr1 DD.  [name] Swallowing difficulty     PAST SURGICAL HISTORY: Past Surgical History:  Procedure Laterality Date  . CESAREAN SECTION  2001 & 2002  . ELBOW SURGERY    . MINOR CARPAL TUNNEL     pinched nerve in elbow  . WISDOM TOOTH EXTRACTION      SOCIAL HISTORY: Social History  Substance Use Topics  . Smoking status: Former Research scientist (life sciences)  . Smokeless tobacco: Never Used     Comment: quit 1992  . Alcohol use No    FAMILY HISTORY: Family History  Problem Relation Age of Onset  . Hypertension Mother        Living  . Arthritis Mother   . Thyroid disease Mother   . Heart disease Mother        MI at age 42  . Heart attack Mother   . Migraines Mother   . Depression Mother   . Obesity Mother   . Hypertension Maternal Grandmother   . Parkinson's disease Maternal Grandmother   . Alzheimer's disease Maternal Grandmother   . Cancer Maternal Grandfather   . Hypertension Maternal Grandfather   . Hypertension Paternal  Grandmother   . Hypertension Paternal Grandfather   . Gout Brother   . ADD / ADHD Son        x1  . Other Son        #2-Unknown  . Diabetes Neg Hx     ROS: Review of Systems  Constitutional: Positive for weight loss.  Gastrointestinal: Negative for nausea and vomiting.  Endo/Heme/Allergies:       Negative hypoglycemia  Psychiatric/Behavioral: Positive for depression. Negative for suicidal ideas.  PHYSICAL EXAM: Blood pressure 112/74, pulse 78, temperature 98.4 F (36.9 C), temperature source Oral, height 5\' 3"  (1.6 m), weight 267 lb (121.1 kg), last menstrual period 06/07/2016, SpO2 99 %. Body mass index is 47.3 kg/m. Physical Exam  Constitutional: She is oriented to person, place, and time. She appears well-developed and well-nourished.  Cardiovascular: Normal rate.   Pulmonary/Chest: Effort normal.  Musculoskeletal: Normal range of motion.  Neurological: She is oriented to person, place, and time.  Skin: Skin is warm and dry.  Vitals reviewed.   RECENT LABS AND TESTS: BMET    Component Value Date/Time   NA 140 06/27/2016 1629   NA 136 03/10/2016 1051   K 3.5 06/27/2016 1629   CL 108 06/27/2016 1629   CO2 25 06/27/2016 1629   GLUCOSE 77 06/27/2016 1629   BUN 11 06/27/2016 1629   BUN 14 03/10/2016 1051   CREATININE 0.77 06/27/2016 1629   CALCIUM 9.3 06/27/2016 1629   GFRNONAA >60 03/22/2016 1828   GFRAA >60 03/22/2016 1828   Lab Results  Component Value Date   HGBA1C 4.8 03/10/2016   HGBA1C 4.7 07/22/2015   Lab Results  Component Value Date   INSULIN 37.1 (H) 03/10/2016   CBC    Component Value Date/Time   WBC 7.1 06/27/2016 1629   RBC 4.27 06/27/2016 1629   HGB 12.4 06/27/2016 1629   HCT 36.4 06/27/2016 1629   PLT 356.0 06/27/2016 1629   MCV 85.1 06/27/2016 1629   MCH 28.9 03/22/2016 1828   MCHC 34.2 06/27/2016 1629   RDW 14.0 06/27/2016 1629   LYMPHSABS 1.7 06/27/2016 1629   MONOABS 0.6 06/27/2016 1629   EOSABS 0.2 06/27/2016 1629    BASOSABS 0.1 06/27/2016 1629   Iron/TIBC/Ferritin/ %Sat No results found for: IRON, TIBC, FERRITIN, IRONPCTSAT Lipid Panel     Component Value Date/Time   CHOL 123 03/10/2016 1051   TRIG 87 03/10/2016 1051   HDL 38 (L) 03/10/2016 1051   CHOLHDL 3 07/22/2015 0847   VLDL 11.2 07/22/2015 0847   LDLCALC 68 03/10/2016 1051   Hepatic Function Panel     Component Value Date/Time   PROT 7.1 06/27/2016 1629   PROT 7.2 03/10/2016 1051   ALBUMIN 4.1 06/27/2016 1629   ALBUMIN 4.2 03/10/2016 1051   AST 11 06/27/2016 1629   ALT 15 06/27/2016 1629   ALKPHOS 49 06/27/2016 1629   BILITOT 0.3 06/27/2016 1629   BILITOT 0.4 03/10/2016 1051      Component Value Date/Time   TSH 1.98 04/21/2016 0709   TSH 0.428 (L) 03/10/2016 1051   TSH 5.19 (H) 02/23/2016 1235    ASSESSMENT AND PLAN: Prediabetes  Other depression  Morbid obesity ([place])  PLAN:  Pre-Diabetes Czarina will continue to work on weight loss, exercise, and decreasing simple carbohydrates in her diet to help decrease the risk of diabetes. We dicussed metformin including benefits and risks. She was informed that eating too many simple carbohydrates or too many calories at one sitting increases the likelihood of GI side effects. Madisson agrees to continue metformin for now and follow up with [name] as directed to monitor her progress.  Depression with Emotional Eating Behaviors We discussed behavior modification techniques today to help Angelita deal with her emotional eating and depression. She has agreed to continue Topamax as directed and cognitive behavioral therapy and follow up with our clinic in 2 weeks.  We spent > than 50% of the 30 minute visit on the counseling as documented in the note.  Obesity  Vercie is currently in the action stage of change. As such, her goal is to continue with weight loss efforts She has agreed to keep a food journal with 350 to 550 calories and 35+ grams of protein  and follow the Category 2  plan Monque has been instructed to work up to a goal of 150 minutes of combined cardio and strengthening exercise per week for weight loss and overall health benefits. We discussed the following Behavioral Modification Strategies today: increasing lean protein intake, decreasing simple carbohydrates , increasing fiber rich foods and decrease liquid calories  Saina has agreed to follow up with our clinic in 2 weeks. She was informed of the importance of frequent follow up visits to maximize her success with intensive lifestyle modifications for her multiple health conditions.  I, Doreene Nest, am acting as scribe for Dennard Nip, MD  I have reviewed the above documentation for accuracy and completeness, and I agree with the above. -Dennard Nip, MD  OBESITY BEHAVIORAL INTERVENTION VISIT  Today's visit was # 8 out of 22.  Starting weight: 280 lbs Starting date: 03/10/16 Todays weight : 267 lbs Total lbs lost to date: 13 (Patients must lose 7 lbs in the first 6 months to continue with counseling)   ASK: We discussed the diagnosis of obesity with [name] today and Roxanna agreed to give [name] permission to discuss obesity behavioral modification therapy today.  ASSESS: Jerilee has the diagnosis of obesity and her BMI today is @TBMI @ Kimba is in the action stage of change   ADVISE: Kierstan was educated on the multiple health risks of obesity as well as the benefit of weight loss to improve her health. She was advised of the need for long term treatment and the importance of lifestyle modifications.  AGREE: Multiple dietary modification options and treatment options were discussed and  Crescent agreed to keep a food journal with 350 to 550 calories and 35+ grams of protein  and follow the Category 2 plan We discussed the following Behavioral Modification Strategies today: increasing lean protein intake, decreasing simple carbohydrates , increasing fiber rich foods and decrease liquid  calories

## 2016-07-13 ENCOUNTER — Ambulatory Visit (INDEPENDENT_AMBULATORY_CARE_PROVIDER_SITE_OTHER): Payer: BLUE CROSS/BLUE SHIELD | Admitting: Family Medicine

## 2016-07-13 VITALS — BP 109/73 | HR 96 | Temp 98.2°F | Ht 63.0 in | Wt 263.0 lb

## 2016-07-13 DIAGNOSIS — E559 Vitamin D deficiency, unspecified: Secondary | ICD-10-CM

## 2016-07-13 DIAGNOSIS — R7303 Prediabetes: Secondary | ICD-10-CM | POA: Diagnosis not present

## 2016-07-13 DIAGNOSIS — F3289 Other specified depressive episodes: Secondary | ICD-10-CM | POA: Diagnosis not present

## 2016-07-13 MED ORDER — METFORMIN HCL 500 MG PO TABS: 500.0000 mg | ORAL_TABLET | Freq: Every day | ORAL | 0 refills | Status: DC

## 2016-07-13 MED ORDER — VITAMIN D (ERGOCALCIFEROL) 1.25 MG (50000 UNIT) PO CAPS: 50000.0000 [IU] | ORAL_CAPSULE | ORAL | 0 refills | Status: DC

## 2016-07-13 MED ORDER — TOPIRAMATE 100 MG PO TABS: 100.0000 mg | ORAL_TABLET | Freq: Every day | ORAL | 0 refills | Status: DC

## 2016-07-14 NOTE — Progress Notes (Signed)
Office: [phone]  /  Fax: [phone]   HPI:   Chief Complaint: OBESITY Sharon Rivers is here to discuss her progress with her obesity treatment plan. She is on the  follow the Category 3 plan and is following her eating plan approximately 80 % of the time. She states she is walking 30 minutes 3 times per week. Sharon Rivers continues to do very well with weight loss, hunger is mostly controlled and is happy with category 3 plan but makes some substitutions. Her weight is 263 lb (119.3 kg) today and has had a weight loss of 4 pounds over a period of 3 weeks since her last visit. She has lost 17 lbs since starting treatment with [name].  Vitamin D deficiency Sharon Rivers has a diagnosis of vitamin D deficiency. She is currently stable on vit D, not yet at goal and denies nausea, vomiting or muscle weakness.  Pre-Diabetes Sharon Rivers has a diagnosis of pre-diabetes based on her elevated Hgb A1c and was informed this puts her at greater risk of developing diabetes. She is taking metformin currently and continues to work on diet and exercise to decrease risk of diabetes. She has decreased polyphagia and denies nausea, vomiting or hypoglycemia.  Depression with emotional eating behaviors Sharon Rivers's mood is stable on topamax and she has decreased emotional eating. She struggles with emotional eating and using food for comfort to the extent that it is negatively impacting her health. She often snacks when she is not hungry. Sharon Rivers sometimes feels she is out of control and then feels guilty that she made poor food choices. She has been working on behavior modification techniques to help reduce her emotional eating and has been somewhat successful. She shows no sign of suicidal or homicidal ideations.  Depression screen Sharon Rivers 2/9 03/10/2016 11/12/2015  Decreased Interest 3 0  Down, Depressed, Hopeless 3 1  PHQ - 2 Score 6 1  Altered sleeping 3 -  Tired, decreased energy 3 -  Change in appetite 3 -  Feeling bad or failure  about yourself  3 -  Trouble concentrating 3 -  Moving slowly or fidgety/restless 2 -  PHQ-9 Score 23 -     ALLERGIES: Allergies  Allergen Reactions   Adhesive [Tape] Other (See Comments)    Skin Burn.   Biofreeze [Menthol (Topical Analgesic)] Other (See Comments)    Skin Burn.   Sulfa Antibiotics Rash    MEDICATIONS: Current Outpatient Prescriptions on File Prior to Visit  Medication Sig Dispense Refill   fluticasone (FLONASE) 50 MCG/ACT nasal spray Place 2 sprays into both nostrils daily. 16 g 0   gabapentin (NEURONTIN) 300 MG capsule Take 300 mg by mouth 3 (three) times daily.     levothyroxine (SYNTHROID, LEVOTHROID) 175 MCG tablet TAKE 1 TABLET(175 MCG) BY MOUTH DAILY BEFORE BREAKFAST 30 tablet 5   lisinopril (PRINIVIL,ZESTRIL) 20 MG tablet TAKE 1 TABLET(20 MG) BY MOUTH DAILY 30 tablet 2   loratadine (CLARITIN) 10 MG tablet Take 1 tablet (10 mg total) by mouth daily. 30 tablet 11   norethindrone (JOLIVETTE) 0.35 MG tablet Take 1 tablet (0.35 mg total) by mouth daily. 1 Package 12   ondansetron (ZOFRAN) 4 MG tablet Take 1 tablet (4 mg total) by mouth every 8 (eight) hours as needed for nausea or vomiting. 20 tablet 0   pantoprazole (PROTONIX) 40 MG tablet TAKE 1 TABLET(40 MG) BY MOUTH DAILY 30 tablet 5   SUMAtriptan (IMITREX) 100 MG tablet Take 1 tablet earliest onset of migraine.  May repeat once in  2 hours if headache persists or recurs.  Do not exceed more than 2 tablets in 24 hours 10 tablet 2   venlafaxine XR (EFFEXOR-XR) 37.5 MG 24 hr capsule TAKE 1 CAPSULE BY MOUTH EVERY DAY FOR 3 DAYS. THEN INCREASE TO 2 CAPSULE CAPSULES DAILY 60 capsule 3   amLODipine (NORVASC) 10 MG tablet Take 1 tablet (10 mg total) by mouth daily. (Patient not taking: Reported on 07/13/2016) 30 tablet 5   cyclobenzaprine (FLEXERIL) 5 MG tablet Take 1 tablet (5 mg total) by mouth 3 (three) times daily as needed for muscle spasms. (Patient not taking: Reported on 07/13/2016) 20 tablet 0    Hydrocodone-Acetaminophen 5-300 MG TABS Take 1 tablet by mouth every 6 (six) hours as needed. (Patient not taking: Reported on 07/13/2016) 12 each 0   No current facility-administered medications on file prior to visit.     PAST MEDICAL HISTORY: Past Medical History:  Diagnosis Date   Anxiety    Back pain    Chest pain    a. 07/2015 Myoview: EF 71%, medium size, mild intensity, partially reversible septal defect with overlying breast attenuation -> likely artifact. No significant reversible ischemia.   Constipation    Depression    Edema    feet and legs   Frequent headaches    GERD (gastroesophageal reflux disease)    Hypertension    Hypothyroidism    Joint pain    Migraines    OSA (obstructive sleep apnea) 03/17/2016   Osteoarthritis    Panic anxiety syndrome    SOB (shortness of breath)    a. 04/2016 Echo: EF 60-65%, Gr1 DD.   Swallowing difficulty     PAST SURGICAL HISTORY: Past Surgical History:  Procedure Laterality Date   CESAREAN SECTION  2001 & 2002   ELBOW SURGERY     MINOR CARPAL TUNNEL     pinched nerve in elbow   WISDOM TOOTH EXTRACTION      SOCIAL HISTORY: Social History  Substance Use Topics   Smoking status: Former Smoker   Smokeless tobacco: Never Used     Comment: quit 1992   Alcohol use No    FAMILY HISTORY: Family History  Problem Relation Age of Onset   Hypertension Mother        Living   Arthritis Mother    Thyroid disease Mother    Heart disease Mother        MI at age 37   Heart attack Mother    Migraines Mother    Depression Mother    Obesity Mother    Hypertension Maternal Grandmother    Parkinson's disease Maternal Grandmother    Alzheimer's disease Maternal Grandmother    Cancer Maternal Grandfather    Hypertension Maternal Grandfather    Hypertension Paternal Grandmother    Hypertension Paternal Grandfather    Gout Brother    ADD / ADHD Son        x1   Other Son        #2-Unknown     Diabetes Neg Hx     ROS: Review of Systems  Constitutional: Positive for weight loss.  Gastrointestinal: Negative for nausea and vomiting.  Musculoskeletal:       Negative muscle weakness  Endo/Heme/Allergies:       Polyphagia Negative hypoglycemia  Psychiatric/Behavioral: Positive for depression. Negative for suicidal ideas.    PHYSICAL EXAM: Blood pressure 109/73, pulse 96, temperature 98.2 F (36.8 C), temperature source Oral, height 5\' 3"  (1.6 m), weight 263 lb (119.3  kg), SpO2 97 %. Body mass index is 46.59 kg/m. Physical Exam  Constitutional: She is oriented to person, place, and time. She appears well-developed and well-nourished.  Cardiovascular: Normal rate.   Pulmonary/Chest: Effort normal.  Musculoskeletal: Normal range of motion.  Neurological: She is oriented to person, place, and time.  Skin: Skin is warm and dry.  Vitals reviewed.   RECENT LABS AND TESTS: BMET    Component Value Date/Time   NA 140 06/27/2016 1629   NA 136 03/10/2016 1051   K 3.5 06/27/2016 1629   CL 108 06/27/2016 1629   CO2 25 06/27/2016 1629   GLUCOSE 77 06/27/2016 1629   BUN 11 06/27/2016 1629   BUN 14 03/10/2016 1051   CREATININE 0.77 06/27/2016 1629   CALCIUM 9.3 06/27/2016 1629   GFRNONAA >60 03/22/2016 1828   GFRAA >60 03/22/2016 1828   Lab Results  Component Value Date   HGBA1C 4.8 03/10/2016   HGBA1C 4.7 07/22/2015   Lab Results  Component Value Date   INSULIN 37.1 (H) 03/10/2016   CBC    Component Value Date/Time   WBC 7.1 06/27/2016 1629   RBC 4.27 06/27/2016 1629   HGB 12.4 06/27/2016 1629   HCT 36.4 06/27/2016 1629   PLT 356.0 06/27/2016 1629   MCV 85.1 06/27/2016 1629   MCH 28.9 03/22/2016 1828   MCHC 34.2 06/27/2016 1629   RDW 14.0 06/27/2016 1629   LYMPHSABS 1.7 06/27/2016 1629   MONOABS 0.6 06/27/2016 1629   EOSABS 0.2 06/27/2016 1629   BASOSABS 0.1 06/27/2016 1629   Iron/TIBC/Ferritin/ %Sat No results found for: IRON, TIBC, FERRITIN,  IRONPCTSAT Lipid Panel     Component Value Date/Time   CHOL 123 03/10/2016 1051   TRIG 87 03/10/2016 1051   HDL 38 (L) 03/10/2016 1051   CHOLHDL 3 07/22/2015 0847   VLDL 11.2 07/22/2015 0847   LDLCALC 68 03/10/2016 1051   Hepatic Function Panel     Component Value Date/Time   PROT 7.1 06/27/2016 1629   PROT 7.2 03/10/2016 1051   ALBUMIN 4.1 06/27/2016 1629   ALBUMIN 4.2 03/10/2016 1051   AST 11 06/27/2016 1629   ALT 15 06/27/2016 1629   ALKPHOS 49 06/27/2016 1629   BILITOT 0.3 06/27/2016 1629   BILITOT 0.4 03/10/2016 1051      Component Value Date/Time   TSH 1.98 04/21/2016 0709   TSH 0.428 (L) 03/10/2016 1051   TSH 5.19 (H) 02/23/2016 1235    ASSESSMENT AND PLAN: Prediabetes - Plan: metFORMIN (GLUCOPHAGE) 500 MG tablet  Other depression - Plan: topiramate (TOPAMAX) 100 MG tablet  Vitamin D deficiency - Plan: Vitamin D, Ergocalciferol, (DRISDOL) 50000 units CAPS capsule  Morbid obesity ([place]) - BMI 46.7  PLAN:  Vitamin D Deficiency Keonta was informed that low vitamin D levels contributes to fatigue and are associated with obesity, breast, and colon cancer. She agrees to continue to take prescription Vit D @50 ,000 IU every week, we will refill for 1 month and will follow up for routine testing of vitamin D, at least 2-3 times per year. She was informed of the risk of over-replacement of vitamin D and agrees to not increase her dose unless he discusses this with [name] first. Casi agrees to follow up with our clinic in 3 weeks.  Pre-Diabetes Nakita will continue to work on weight loss, exercise, and decreasing simple carbohydrates in her diet to help decrease the risk of diabetes. We dicussed metformin including benefits and risks. She was informed that eating too many  simple carbohydrates or too many calories at one sitting increases the likelihood of GI side effects. Derika agrees to continue to take metformin for now and a prescription was written today for 1 month  refill. Arryana agreed to follow up with [name] as directed to monitor her progress.  Depression with Emotional Eating Behaviors We discussed behavior modification techniques today to help Judith deal with her emotional eating and depression. She has agreed to continue to take topamax 100 mg qd and agreed to follow up as directed.  Obesity Darianne is currently in the action stage of change. As such, her goal is to continue with weight loss efforts She has agreed to follow the Category 3 plan Kirbie has been instructed to work up to a goal of 150 minutes of combined cardio and strengthening exercise per week for weight loss and overall health benefits. We discussed the following Behavioral Modification Strategies today: no skipping meals, meal planning & cooking strategies, increasing lean protein intake and decreasing simple carbohydrates   Zhania has agreed to follow up with our clinic in 3 weeks. She was informed of the importance of frequent follow up visits to maximize her success with intensive lifestyle modifications for her multiple health conditions.  I, Doreene Nest, am acting as scribe for Dennard Nip, MD  I have reviewed the above documentation for accuracy and completeness, and I agree with the above. -Dennard Nip, MD  OBESITY BEHAVIORAL INTERVENTION VISIT  Today's visit was # 9 out of 22.  Starting weight: 280 lbs Starting date: 03/10/16 Today's weight : 263 lbs Today's date: 07/13/2016 Total lbs lost to date: 17 (Patients must lose 7 lbs in the first 6 months to continue with counseling)   ASK: We discussed the diagnosis of obesity with [name] today and Shannell agreed to give [name] permission to discuss obesity behavioral modification therapy today.  ASSESS: Jorryn has the diagnosis of obesity and her BMI today is 46.7 Alixandrea is in the action stage of change   ADVISE: Mechele was educated on the multiple health risks of obesity as well as the benefit of weight loss to  improve her health. She was advised of the need for long term treatment and the importance of lifestyle modifications.  AGREE: Multiple dietary modification options and treatment options were discussed and  Tatum agreed to follow the Category 3 plan We discussed the following Behavioral Modification Strategies today: increasing lean protein intake and decreasing simple carbohydrates, no skipping meals and meal planning & cooking strategies.

## 2016-07-16 ENCOUNTER — Emergency Department (HOSPITAL_BASED_OUTPATIENT_CLINIC_OR_DEPARTMENT_OTHER)
Admission: EM | Admit: 2016-07-16 | Discharge: 2016-07-16 | Disposition: A | Discharge status: Home / Self Care | Payer: BLUE CROSS/BLUE SHIELD | Attending: Emergency Medicine | Admitting: Emergency Medicine

## 2016-07-16 ENCOUNTER — Emergency Department (HOSPITAL_BASED_OUTPATIENT_CLINIC_OR_DEPARTMENT_OTHER): Payer: BLUE CROSS/BLUE SHIELD

## 2016-07-16 ENCOUNTER — Encounter (HOSPITAL_BASED_OUTPATIENT_CLINIC_OR_DEPARTMENT_OTHER): Payer: Self-pay | Admitting: Emergency Medicine

## 2016-07-16 DIAGNOSIS — Z87891 Personal history of nicotine dependence: Secondary | ICD-10-CM | POA: Insufficient documentation

## 2016-07-16 DIAGNOSIS — J069 Acute upper respiratory infection, unspecified: Secondary | ICD-10-CM | POA: Diagnosis not present

## 2016-07-16 DIAGNOSIS — I10 Essential (primary) hypertension: Secondary | ICD-10-CM | POA: Diagnosis not present

## 2016-07-16 DIAGNOSIS — K0889 Other specified disorders of teeth and supporting structures: Secondary | ICD-10-CM | POA: Diagnosis not present

## 2016-07-16 DIAGNOSIS — R05 Cough: Secondary | ICD-10-CM | POA: Diagnosis present

## 2016-07-16 DIAGNOSIS — K047 Periapical abscess without sinus: Secondary | ICD-10-CM

## 2016-07-16 DIAGNOSIS — E039 Hypothyroidism, unspecified: Secondary | ICD-10-CM | POA: Insufficient documentation

## 2016-07-16 DIAGNOSIS — Z79899 Other long term (current) drug therapy: Secondary | ICD-10-CM | POA: Diagnosis not present

## 2016-07-16 DIAGNOSIS — B9789 Other viral agents as the cause of diseases classified elsewhere: Secondary | ICD-10-CM

## 2016-07-16 MED ORDER — PENICILLIN V POTASSIUM 250 MG PO TABS
500.0000 mg | ORAL_TABLET | Freq: Once | ORAL | Status: AC
  Administered 2016-07-16: 500 mg via ORAL
  Filled 2016-07-16: qty 2

## 2016-07-16 MED ORDER — NAPROXEN 250 MG PO TABS
500.0000 mg | ORAL_TABLET | Freq: Once | ORAL | Status: AC
  Administered 2016-07-16: 500 mg via ORAL
  Filled 2016-07-16: qty 2

## 2016-07-16 MED ORDER — NAPROXEN 500 MG PO TABS: 500.0000 mg | ORAL_TABLET | Freq: Two times a day (BID) | ORAL | 0 refills | Status: DC

## 2016-07-16 MED ORDER — PENICILLIN V POTASSIUM 500 MG PO TABS: 500.0000 mg | ORAL_TABLET | Freq: Four times a day (QID) | ORAL | 0 refills | Status: AC

## 2016-07-16 MED ORDER — BENZONATATE 100 MG PO CAPS: 100.0000 mg | ORAL_CAPSULE | Freq: Three times a day (TID) | ORAL | 0 refills | Status: DC

## 2016-07-16 NOTE — ED Triage Notes (Signed)
Cough x 1 week. Dental pain to R lower teeth with possible infection. Pt states she has bad teeth.

## 2016-07-16 NOTE — Discharge Instructions (Signed)
Take the medications as needed for the cough. Take the antibiotics until finished to treat the dental infection. Follow-up with a dentist as soon as possible.  Return to an emergency room if he began having worsening swallowing , increasing redness

## 2016-07-16 NOTE — ED Provider Notes (Signed)
[facility] [facility] Provider Note   CSN: [id number] Arrival date & time: 07/16/16  1004     History   Chief Complaint Chief Complaint  Patient presents with  . Cough  . Dental Pain    HPI Sharon Rivers is a 46 y.o. female.  HPI Patient presents to the emergency room for evaluation of cough and toothache. Patient states she's had some upper respiratory symptoms recently. She has been coughing without any fever. She does have a history of chronic dental caries and eating a dentist. However, she started having increasing pain in her teeth and felt some swelling below her jaw. She denies vomiting and diarrhea. No dysuria. No difficulty swallowing or breathing. It does hurt for her to bite and chew Past Medical History:  Diagnosis Date  . Anxiety   . Back pain   . Chest pain    a. 07/2015 Myoview: EF 71%, medium size, mild intensity, partially reversible septal defect with overlying breast attenuation -> likely artifact. No significant reversible ischemia.  . Constipation   . Depression   . Edema    feet and legs  . Frequent headaches   . GERD (gastroesophageal reflux disease)   . Hypertension   . Hypothyroidism   . Joint pain   . Migraines   . OSA (obstructive sleep apnea) 03/17/2016  . Osteoarthritis   . Panic anxiety syndrome   . SOB (shortness of breath)    a. 04/2016 Echo: EF 60-65%, Gr1 DD.  [name] Swallowing difficulty     Patient Active Problem List   Diagnosis Date Noted  . Insulin resistance 06/13/2016  . Vitamin D deficiency 04/12/2016  . OSA (obstructive sleep apnea) 03/17/2016  . Insomnia 02/23/2016  . GERD (gastroesophageal reflux disease) 02/23/2016  . Migraines 02/23/2016  . Depression 02/23/2016  . Hypertension 07/22/2015  . Lateral epicondylitis 07/22/2015  . Thyroid activity decreased 07/22/2015  . Family history of early CAD 07/22/2015  . Carpal tunnel syndrome 04/30/2015  . Morbid obesity ([place]) 09/12/2013    Past Surgical History:  Procedure  Laterality Date  . CESAREAN SECTION  2001 & 2002  . ELBOW SURGERY    . MINOR CARPAL TUNNEL     pinched nerve in elbow  . WISDOM TOOTH EXTRACTION      OB History    Gravida Para Term Preterm AB Living   3 2 2   1 2    SAB TAB Ectopic Multiple Live Births                   Home Medications    Prior to Admission medications   Medication Sig Start Date End Date Taking? Authorizing Provider  benzonatate (TESSALON) 100 MG capsule Take 1 capsule (100 mg total) by mouth every 8 (eight) hours. 07/16/16   Dorie Rank, MD  fluticasone (FLONASE) 50 MCG/ACT nasal spray Place 2 sprays into both nostrils daily. 03/14/16   [name], NP  gabapentin (NEURONTIN) 300 MG capsule Take 300 mg by mouth 3 (three) times daily.    [provider]  Hydrocodone-Acetaminophen 5-300 MG TABS Take 1 tablet by mouth every 6 (six) hours as needed. Patient not taking: Reported on 07/13/2016 07/05/16   [name], NP  levothyroxine (SYNTHROID, LEVOTHROID) 175 MCG tablet TAKE 1 TABLET(175 MCG) BY MOUTH DAILY BEFORE BREAKFAST 06/15/16   [name], NP  lisinopril (PRINIVIL,ZESTRIL) 20 MG tablet TAKE 1 TABLET(20 MG) BY MOUTH DAILY 06/20/16   [name], NP  loratadine (CLARITIN) 10 MG tablet Take 1 tablet (  10 mg total) by mouth daily. 03/14/16   [name], NP  metFORMIN (GLUCOPHAGE) 500 MG tablet Take 1 tablet (500 mg total) by mouth daily with breakfast. 07/13/16   Dennard Nip D, MD  naproxen (NAPROSYN) 500 MG tablet Take 1 tablet (500 mg total) by mouth 2 (two) times daily. 07/16/16   Dorie Rank, MD  norethindrone (JOLIVETTE) 0.35 MG tablet Take 1 tablet (0.35 mg total) by mouth daily. 07/27/15   Brunetta Jeans, PA-C  ondansetron (ZOFRAN) 4 MG tablet Take 1 tablet (4 mg total) by mouth every 8 (eight) hours as needed for nausea or vomiting. 08/21/15   Brunetta Jeans, PA-C  pantoprazole (PROTONIX) 40 MG tablet TAKE 1 TABLET(40 MG) BY MOUTH DAILY 06/15/16   [name],  NP  penicillin v potassium (VEETID) 500 MG tablet Take 1 tablet (500 mg total) by mouth 4 (four) times daily. 07/16/16 07/23/16  Dorie Rank, MD  SUMAtriptan (IMITREX) 100 MG tablet Take 1 tablet earliest onset of migraine.  May repeat once in 2 hours if headache persists or recurs.  Do not exceed more than 2 tablets in 24 hours 05/16/16   Pieter Partridge, DO  topiramate (TOPAMAX) 100 MG tablet Take 1 tablet (100 mg total) by mouth at bedtime. 07/13/16   Dennard Nip D, MD  venlafaxine XR (EFFEXOR-XR) 37.5 MG 24 hr capsule TAKE 1 CAPSULE BY MOUTH EVERY DAY FOR 3 DAYS. THEN INCREASE TO 2 CAPSULE CAPSULES DAILY 04/19/16   [name], NP  Vitamin D, Ergocalciferol, (DRISDOL) 50000 units CAPS capsule Take 1 capsule (50,000 Units total) by mouth every 7 (seven) days. 07/13/16   Starlyn Skeans, MD    Family History Family History  Problem Relation Age of Onset  . Hypertension Mother        Living  . Arthritis Mother   . Thyroid disease Mother   . Heart disease Mother        MI at age 36  . Heart attack Mother   . Migraines Mother   . Depression Mother   . Obesity Mother   . Hypertension Maternal Grandmother   . Parkinson's disease Maternal Grandmother   . Alzheimer's disease Maternal Grandmother   . Cancer Maternal Grandfather   . Hypertension Maternal Grandfather   . Hypertension Paternal Grandmother   . Hypertension Paternal Grandfather   . Gout Brother   . ADD / ADHD Son        x1  . Other Son        #2-Unknown  . Diabetes Neg Hx     Social History Social History  Substance Use Topics  . Smoking status: Former Research scientist (life sciences)  . Smokeless tobacco: Never Used     Comment: quit 1992  . Alcohol use No     Allergies   Adhesive [tape]; Biofreeze [menthol (topical analgesic)]; and Sulfa antibiotics   Review of Systems Review of Systems  All other systems reviewed and are negative.    Physical Exam Updated Vital Signs BP 117/75 (BP Location: Left Arm)   Pulse (!) 110   Temp  98.8 F (37.1 C) (Oral)   Resp 18   Ht 1.575 m (5\' 2" )   Wt 118.4 kg (261 lb)   LMP 07/04/2016   SpO2 99%   BMI 47.74 kg/m   Physical Exam  Constitutional: She appears well-developed and well-nourished. No distress.  Obese  HENT:  Head: Normocephalic and atraumatic.  Right Ear: External ear normal.  Left Ear: External ear normal.  Dental decay and dental caries, erythema of the gingiva, no purulent drainage, submandibular lymphadenopathy but no overt edema  Eyes: Conjunctivae are normal. Right eye exhibits no discharge. Left eye exhibits no discharge. No scleral icterus.  Neck: Neck supple. No tracheal deviation present.  Cardiovascular: Normal rate, regular rhythm and intact distal pulses.   Pulmonary/Chest: Effort normal and breath sounds normal. No stridor. No respiratory distress. She has no wheezes. She has no rales.  Abdominal: Soft. Bowel sounds are normal. She exhibits no distension. There is no tenderness. There is no rebound and no guarding.  Musculoskeletal: She exhibits no edema or tenderness.  Neurological: She is alert. She has normal strength. No cranial nerve deficit (no facial droop, extraocular movements intact, no slurred speech) or sensory deficit. She exhibits normal muscle tone. She displays no seizure activity. Coordination normal.  Skin: Skin is warm and dry. No rash noted.  Psychiatric: She has a normal mood and affect.  Nursing note and vitals reviewed.    ED Treatments / Results  Labs (all labs ordered are listed, but only abnormal results are displayed) Labs Reviewed - No data to display   Radiology No results found.  Procedures Procedures (including critical care time)  Medications Ordered in ED Medications  penicillin v potassium (VEETID) tablet 500 mg (not administered)  naproxen (NAPROSYN) tablet 500 mg (not administered)     Initial Impression / Assessment and Plan / ED Course  I have reviewed the triage vital signs and the nursing  notes.  Pertinent labs & imaging results that were available during my care of the patient were reviewed by me and considered in my medical decision making (see chart for details).   patient's lungs are Clear. Vital signs are reassuring as suspect a viral URI. She does have widespread dental decay. I suspect her increased swelling and tenderness is associated with a dental infection. Plan on discharge home with oral antibiotics. I discussed the importance of close follow-up with a dentist or oral surgeon.  Final Clinical Impressions(s) / ED Diagnoses   Final diagnoses:  Viral URI with cough  Dental infection    New Prescriptions New Prescriptions   BENZONATATE (TESSALON) 100 MG CAPSULE    Take 1 capsule (100 mg total) by mouth every 8 (eight) hours.   NAPROXEN (NAPROSYN) 500 MG TABLET    Take 1 tablet (500 mg total) by mouth 2 (two) times daily.   PENICILLIN V POTASSIUM (VEETID) 500 MG TABLET    Take 1 tablet (500 mg total) by mouth 4 (four) times daily.     Dorie Rank, MD 07/16/16 1028

## 2016-07-25 ENCOUNTER — Ambulatory Visit: Payer: Self-pay | Admitting: Family

## 2016-08-01 ENCOUNTER — Ambulatory Visit: Payer: Self-pay | Admitting: Family

## 2016-08-01 ENCOUNTER — Ambulatory Visit (INDEPENDENT_AMBULATORY_CARE_PROVIDER_SITE_OTHER): Payer: BLUE CROSS/BLUE SHIELD | Admitting: Family

## 2016-08-01 ENCOUNTER — Encounter: Payer: Self-pay | Admitting: Family

## 2016-08-01 VITALS — BP 128/79 | HR 71 | Temp 99.1°F | Resp 18 | Ht 63.0 in | Wt 267.0 lb

## 2016-08-01 DIAGNOSIS — I10 Essential (primary) hypertension: Secondary | ICD-10-CM | POA: Diagnosis not present

## 2016-08-01 DIAGNOSIS — E039 Hypothyroidism, unspecified: Secondary | ICD-10-CM

## 2016-08-01 DIAGNOSIS — M545 Low back pain: Secondary | ICD-10-CM | POA: Diagnosis not present

## 2016-08-01 NOTE — Patient Instructions (Signed)
Keep up the great work with healthy diet and weight loss.

## 2016-08-01 NOTE — Progress Notes (Signed)
Subjective:    Patient ID: Sharon Rivers, female    DOB: [birth year], 46 y.o.   MRN: [id number]  HPI  Sharon Rivers is a 46 yr old female who presents today for follow up.  1) HTN- She continues lisinopril. Denie CP/SOB.  BP Readings from Last 3 Encounters:  08/01/16 128/79  07/16/16 117/75  07/13/16 109/73   2) Hypothyroid- She continues synthroid. Feeling well on current dose.   Lab Results  Component Value Date   TSH 1.98 04/21/2016   3) low back pain- had some improvement with medrol dose pain.    She is working hard on weight loss. She has lost a total of 17 pounds.    Review of Systems    see HPI  Past Medical History:  Diagnosis Date  . Anxiety   . Back pain   . Chest pain    a. 07/2015 Myoview: EF 71%, medium size, mild intensity, partially reversible septal defect with overlying breast attenuation -> likely artifact. No significant reversible ischemia.  . Constipation   . Depression   . Edema    feet and legs  . Frequent headaches   . GERD (gastroesophageal reflux disease)   . Hypertension   . Hypothyroidism   . Joint pain   . Migraines   . OSA (obstructive sleep apnea) 03/17/2016  . Osteoarthritis   . Panic anxiety syndrome   . SOB (shortness of breath)    a. 04/2016 Echo: EF 60-65%, Gr1 DD.  [name] Swallowing difficulty      Social History   Social History  . Marital status: Single    Spouse name: N/A  . Number of children: N/A  . Years of education: N/A   Occupational History  . Assembler     [employer]   Social History Main Topics  . Smoking status: Former Research scientist (life sciences)  . Smokeless tobacco: Never Used     Comment: quit 1992  . Alcohol use No  . Drug use: No  . Sexual activity: Yes    Partners: Female    Birth control/ protection: Pill     Comment: OCP   Other Topics Concern  . Not on file   Social History Narrative   She has 2 children (grown). She did not retain custody of these children.   Lives with boyfriend in [place].   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     Past Surgical History:  Procedure Laterality Date  . CESAREAN SECTION  2001 & 2002  . ELBOW SURGERY    . MINOR CARPAL TUNNEL     pinched nerve in elbow  . WISDOM TOOTH EXTRACTION      Family History  Problem Relation Age of Onset  . Hypertension Mother        Living  . Arthritis Mother   . Thyroid disease Mother   . Heart disease Mother        MI at age 38  . Heart attack Mother   . Migraines Mother   . Depression Mother   . Obesity Mother   . Hypertension Maternal Grandmother   . Parkinson's disease Maternal Grandmother   . Alzheimer's disease Maternal Grandmother   . Cancer Maternal Grandfather   . Hypertension Maternal Grandfather   . Hypertension Paternal Grandmother   . Hypertension Paternal Grandfather   . Gout Brother   . ADD / ADHD Son        x1  . Other Son        #2-Unknown  .  Diabetes Neg Hx     Allergies  Allergen Reactions  . Adhesive [Tape] Other (See Comments)    Skin Burn.  [name] [Menthol (Topical Analgesic)] Other (See Comments)    Skin Burn.  . Sulfa Antibiotics Rash    Current Outpatient Prescriptions on File Prior to Visit  Medication Sig Dispense Refill  . fluticasone (FLONASE) 50 MCG/ACT nasal spray Place 2 sprays into both nostrils daily. 16 g 0  . gabapentin (NEURONTIN) 300 MG capsule Take 300 mg by mouth 3 (three) times daily.    [name] levothyroxine (SYNTHROID, LEVOTHROID) 175 MCG tablet TAKE 1 TABLET(175 MCG) BY MOUTH DAILY BEFORE BREAKFAST 30 tablet 5  . lisinopril (PRINIVIL,ZESTRIL) 20 MG tablet TAKE 1 TABLET(20 MG) BY MOUTH DAILY 30 tablet 2  . loratadine (CLARITIN) 10 MG tablet Take 1 tablet (10 mg total) by mouth daily. 30 tablet 11  . metFORMIN (GLUCOPHAGE) 500 MG tablet Take 1 tablet (500 mg total) by mouth daily with breakfast. 30 tablet 0  . norethindrone (JOLIVETTE) 0.35 MG tablet Take 1 tablet (0.35 mg total) by mouth daily. 1 Package 12  . ondansetron (ZOFRAN) 4 MG tablet  Take 1 tablet (4 mg total) by mouth every 8 (eight) hours as needed for nausea or vomiting. 20 tablet 0  . pantoprazole (PROTONIX) 40 MG tablet TAKE 1 TABLET(40 MG) BY MOUTH DAILY 30 tablet 5  . SUMAtriptan (IMITREX) 100 MG tablet Take 1 tablet earliest onset of migraine.  May repeat once in 2 hours if headache persists or recurs.  Do not exceed more than 2 tablets in 24 hours 10 tablet 2  . topiramate (TOPAMAX) 100 MG tablet Take 1 tablet (100 mg total) by mouth at bedtime. 30 tablet 0  . venlafaxine XR (EFFEXOR-XR) 37.5 MG 24 hr capsule TAKE 1 CAPSULE BY MOUTH EVERY DAY FOR 3 DAYS. THEN INCREASE TO 2 CAPSULE CAPSULES DAILY 60 capsule 3  . Vitamin D, Ergocalciferol, (DRISDOL) 50000 units CAPS capsule Take 1 capsule (50,000 Units total) by mouth every 7 (seven) days. 4 capsule 0   No current facility-administered medications on file prior to visit.     BP 128/79 (BP Location: Left Arm, Cuff Size: Large)   Pulse 71   Temp 99.1 F (37.3 C) (Oral)   Resp 18   Ht 5\' 3"  (1.6 m)   Wt 267 lb (121.1 kg)   LMP 07/04/2016   SpO2 100%   BMI 47.30 kg/m    Objective:   Physical Exam  Constitutional: She is oriented to person, place, and time. She appears well-developed and well-nourished.  Cardiovascular: Normal rate, regular rhythm and normal heart sounds.   No murmur heard. Pulmonary/Chest: Effort normal and breath sounds normal. No respiratory distress. She has no wheezes.  Musculoskeletal: She exhibits no edema.  Neurological: She is alert and oriented to person, place, and time.  Psychiatric: She has a normal mood and affect. Her behavior is normal. Judgment and thought content normal.          Assessment & Plan:

## 2016-08-03 ENCOUNTER — Ambulatory Visit (INDEPENDENT_AMBULATORY_CARE_PROVIDER_SITE_OTHER): Payer: BLUE CROSS/BLUE SHIELD | Admitting: Family Medicine

## 2016-08-03 VITALS — BP 127/81 | HR 75 | Temp 98.1°F | Ht 63.0 in | Wt 261.0 lb

## 2016-08-03 DIAGNOSIS — E669 Obesity, unspecified: Secondary | ICD-10-CM | POA: Diagnosis not present

## 2016-08-03 DIAGNOSIS — M545 Low back pain, unspecified: Secondary | ICD-10-CM | POA: Insufficient documentation

## 2016-08-03 DIAGNOSIS — F3289 Other specified depressive episodes: Secondary | ICD-10-CM

## 2016-08-03 DIAGNOSIS — G8929 Other chronic pain: Secondary | ICD-10-CM | POA: Insufficient documentation

## 2016-08-03 DIAGNOSIS — IMO0001 Reserved for inherently not codable concepts without codable children: Secondary | ICD-10-CM

## 2016-08-03 DIAGNOSIS — Z6841 Body Mass Index (BMI) 40.0 and over, adult: Secondary | ICD-10-CM

## 2016-08-03 DIAGNOSIS — I10 Essential (primary) hypertension: Secondary | ICD-10-CM

## 2016-08-03 NOTE — Assessment & Plan Note (Signed)
Stable on synthroid. Continue same.

## 2016-08-03 NOTE — Progress Notes (Signed)
Office: [phone]  /  Fax: [phone]   HPI:   Chief Complaint: OBESITY Sharon Rivers is here to discuss Sharon Rivers progress with Sharon Rivers obesity treatment plan. She is on the  follow the Category 3 plan and is following Sharon Rivers eating plan approximately 85 % of the time. She states she is walking 30 minutes 3 times per week. Sharon Rivers continues to do well with weight loss. She couldn't eat solid foods for 1 week and then overate the week after but still was able to lose weight. Sharon Rivers weight is 261 lb (118.4 kg) today and has had a weight loss of 2 pounds over a period of 3 weeks since Sharon Rivers last visit. She has lost 19 lbs since starting treatment with [name].  [name] is a 46 y.o. female with hypertension.  Sharon Rivers denies chest pain or shortness of breath on exertion. She is working weight loss to help control Sharon Rivers blood pressure with the goal of decreasing Sharon Rivers risk of heart attack and stroke. Sharon Rivers blood pressure is currently controlled.  Depression with emotional eating behaviors Sharon Rivers is on topamax and is doing well with emotional eating overall. She is sleeping well and there are no changes in mental status. Sharon Rivers struggles with emotional eating and using food for comfort to the extent that it is negatively impacting Sharon Rivers health. She often snacks when she is not hungry. Sharon Rivers sometimes feels she is out of control and then feels guilty that she made poor food choices. She has been working on behavior modification techniques to help reduce Sharon Rivers emotional eating and has been somewhat successful. She shows no sign of suicidal or homicidal ideations.  Depression screen [facility] 2/9 03/10/2016 11/12/2015  Decreased Interest 3 0  Down, Depressed, Hopeless 3 1  PHQ - 2 Score 6 1  Altered sleeping 3 -  Tired, decreased energy 3 -  Change in appetite 3 -  Feeling bad or failure about yourself  3 -  Trouble concentrating 3 -  Moving slowly or fidgety/restless 2 -  PHQ-9 Score 23 -      ALLERGIES: Allergies  Allergen Reactions   Adhesive [Tape] Other (See Comments)    Skin Burn.   Biofreeze [Menthol (Topical Analgesic)] Other (See Comments)    Skin Burn.   Sulfa Antibiotics Rash    MEDICATIONS: Current Outpatient Prescriptions on File Prior to Visit  Medication Sig Dispense Refill   fluticasone (FLONASE) 50 MCG/ACT nasal spray Place 2 sprays into both nostrils daily. 16 g 0   gabapentin (NEURONTIN) 300 MG capsule Take 300 mg by mouth 3 (three) times daily.     levothyroxine (SYNTHROID, LEVOTHROID) 175 MCG tablet TAKE 1 TABLET(175 MCG) BY MOUTH DAILY BEFORE BREAKFAST 30 tablet 5   lisinopril (PRINIVIL,ZESTRIL) 20 MG tablet TAKE 1 TABLET(20 MG) BY MOUTH DAILY 30 tablet 2   loratadine (CLARITIN) 10 MG tablet Take 1 tablet (10 mg total) by mouth daily. 30 tablet 11   metFORMIN (GLUCOPHAGE) 500 MG tablet Take 1 tablet (500 mg total) by mouth daily with breakfast. 30 tablet 0   norethindrone (JOLIVETTE) 0.35 MG tablet Take 1 tablet (0.35 mg total) by mouth daily. 1 Package 12   ondansetron (ZOFRAN) 4 MG tablet Take 1 tablet (4 mg total) by mouth every 8 (eight) hours as needed for nausea or vomiting. 20 tablet 0   pantoprazole (PROTONIX) 40 MG tablet TAKE 1 TABLET(40 MG) BY MOUTH DAILY 30 tablet 5   SUMAtriptan (IMITREX) 100 MG tablet Take 1 tablet earliest onset of  migraine.  May repeat once in 2 hours if headache persists or recurs.  Do not exceed more than 2 tablets in 24 hours 10 tablet 2   topiramate (TOPAMAX) 100 MG tablet Take 1 tablet (100 mg total) by mouth at bedtime. 30 tablet 0   venlafaxine XR (EFFEXOR-XR) 37.5 MG 24 hr capsule TAKE 1 CAPSULE BY MOUTH EVERY DAY FOR 3 DAYS. THEN INCREASE TO 2 CAPSULE CAPSULES DAILY 60 capsule 3   Vitamin D, Ergocalciferol, (DRISDOL) 50000 units CAPS capsule Take 1 capsule (50,000 Units total) by mouth every 7 (seven) days. 4 capsule 0   No current facility-administered medications on file prior to visit.      PAST MEDICAL HISTORY: Past Medical History:  Diagnosis Date   Anxiety    Back pain    Chest pain    a. 07/2015 Myoview: EF 71%, medium size, mild intensity, partially reversible septal defect with overlying breast attenuation -> likely artifact. No significant reversible ischemia.   Constipation    Depression    Edema    feet and legs   Frequent headaches    GERD (gastroesophageal reflux disease)    Hypertension    Hypothyroidism    Joint pain    Migraines    OSA (obstructive sleep apnea) 03/17/2016   Osteoarthritis    Panic anxiety syndrome    SOB (shortness of breath)    a. 04/2016 Echo: EF 60-65%, Gr1 DD.   Swallowing difficulty     PAST SURGICAL HISTORY: Past Surgical History:  Procedure Laterality Date   CESAREAN SECTION  2001 & 2002   ELBOW SURGERY     MINOR CARPAL TUNNEL     pinched nerve in elbow   WISDOM TOOTH EXTRACTION      SOCIAL HISTORY: Social History  Substance Use Topics   Smoking status: Former Smoker   Smokeless tobacco: Never Used     Comment: quit 1992   Alcohol use No    FAMILY HISTORY: Family History  Problem Relation Age of Onset   Hypertension Mother        Living   Arthritis Mother    Thyroid disease Mother    Heart disease Mother        MI at age 50   Heart attack Mother    Migraines Mother    Depression Mother    Obesity Mother    Hypertension Maternal Grandmother    Parkinson's disease Maternal Grandmother    Alzheimer's disease Maternal Grandmother    Cancer Maternal Grandfather    Hypertension Maternal Grandfather    Hypertension Paternal Grandmother    Hypertension Paternal Grandfather    Gout Brother    ADD / ADHD Son        x1   Other Son        #2-Unknown   Diabetes Neg Hx     ROS: Review of Systems  Constitutional: Positive for weight loss.  Psychiatric/Behavioral: Positive for depression. Negative for suicidal ideas.    PHYSICAL EXAM: Blood pressure 127/81,  pulse 75, temperature 98.1 F (36.7 C), temperature source Oral, height 5\' 3"  (1.6 m), weight 261 lb (118.4 kg), last menstrual period 07/04/2016, SpO2 100 %. Body mass index is 46.23 kg/m. Physical Exam  Constitutional: She is oriented to person, place, and time. She appears well-developed and well-nourished.  Cardiovascular: Normal rate.   Pulmonary/Chest: Effort normal.  Musculoskeletal: Normal range of motion.  Neurological: She is oriented to person, place, and time.  Skin: Skin is warm and dry.  Vitals reviewed.   RECENT LABS AND TESTS: BMET    Component Value Date/Time   NA 140 06/27/2016 1629   NA 136 03/10/2016 1051   K 3.5 06/27/2016 1629   CL 108 06/27/2016 1629   CO2 25 06/27/2016 1629   GLUCOSE 77 06/27/2016 1629   BUN 11 06/27/2016 1629   BUN 14 03/10/2016 1051   CREATININE 0.77 06/27/2016 1629   CALCIUM 9.3 06/27/2016 1629   GFRNONAA >60 03/22/2016 1828   GFRAA >60 03/22/2016 1828   Lab Results  Component Value Date   HGBA1C 4.8 03/10/2016   HGBA1C 4.7 07/22/2015   Lab Results  Component Value Date   INSULIN 37.1 (H) 03/10/2016   CBC    Component Value Date/Time   WBC 7.1 06/27/2016 1629   RBC 4.27 06/27/2016 1629   HGB 12.4 06/27/2016 1629   HCT 36.4 06/27/2016 1629   PLT 356.0 06/27/2016 1629   MCV 85.1 06/27/2016 1629   MCH 28.9 03/22/2016 1828   MCHC 34.2 06/27/2016 1629   RDW 14.0 06/27/2016 1629   LYMPHSABS 1.7 06/27/2016 1629   MONOABS 0.6 06/27/2016 1629   EOSABS 0.2 06/27/2016 1629   BASOSABS 0.1 06/27/2016 1629   Iron/TIBC/Ferritin/ %Sat No results found for: IRON, TIBC, FERRITIN, IRONPCTSAT Lipid Panel     Component Value Date/Time   CHOL 123 03/10/2016 1051   TRIG 87 03/10/2016 1051   HDL 38 (L) 03/10/2016 1051   CHOLHDL 3 07/22/2015 0847   VLDL 11.2 07/22/2015 0847   LDLCALC 68 03/10/2016 1051   Hepatic Function Panel     Component Value Date/Time   PROT 7.1 06/27/2016 1629   PROT 7.2 03/10/2016 1051   ALBUMIN 4.1  06/27/2016 1629   ALBUMIN 4.2 03/10/2016 1051   AST 11 06/27/2016 1629   ALT 15 06/27/2016 1629   ALKPHOS 49 06/27/2016 1629   BILITOT 0.3 06/27/2016 1629   BILITOT 0.4 03/10/2016 1051      Component Value Date/Time   TSH 1.98 04/21/2016 0709   TSH 0.428 (L) 03/10/2016 1051   TSH 5.19 (H) 02/23/2016 1235    ASSESSMENT AND PLAN: Other depression  Class 3 obesity with serious comorbidity and body mass index (BMI) of 45.0 to 49.9 in adult, unspecified obesity type (HCC)  PLAN:  Hypertension We discussed sodium restriction, working on healthy weight loss, and a regular exercise program as the means to achieve improved blood pressure control. Danuta agreed with this plan and agreed to follow up as directed. We will continue to monitor Sharon Rivers blood pressure as well as Sharon Rivers progress with the above lifestyle modifications. She will continue Sharon Rivers medications as prescribed and will watch for signs of hypotension as she continues Sharon Rivers lifestyle modifications.  Depression with Emotional Eating Behaviors We discussed behavior modification techniques today to help Darthy deal with Sharon Rivers emotional eating and depression. She has agreed to continue Topamax and cognitive behavioral therapy to decrease emotional eating.  We spent > than 50% of the 15 minute visit on the counseling as documented in the note.  Obesity Samina is currently in the action stage of change. As such, Sharon Rivers goal is to continue with weight loss efforts She has agreed to follow the Category 3 plan Demiya has been instructed to work up to a goal of 150 minutes of combined cardio and strengthening exercise per week for weight loss and overall health benefits. We discussed the following Behavioral Modification Strategies today: increasing lean protein intake, keeping healthy foods in the home and holiday  eating strategies  Amy has agreed to follow up with our clinic in 2 weeks. She was informed of the importance of frequent follow up  visits to maximize Sharon Rivers success with intensive lifestyle modifications for Sharon Rivers multiple health conditions.  I, Doreene Nest, am acting as transcriptionist for Dennard Nip, MD  I have reviewed the above documentation for accuracy and completeness, and I agree with the above. -Dennard Nip, MD  OBESITY BEHAVIORAL INTERVENTION VISIT  Today's visit was # 10 out of 22.  Starting weight: 280 lbs Starting date: 03/10/16 Today's weight : 261 lbs Today's date: 08/03/2016 Total lbs lost to date: 44 (Patients must lose 7 lbs in the first 6 months to continue with counseling)   ASK: We discussed the diagnosis of obesity with [name] today and Kourtlyn agreed to give [name] permission to discuss obesity behavioral modification therapy today.  ASSESS: Keiaira has the diagnosis of obesity and Sharon Rivers BMI today is 35 Cassia is in the action stage of change   ADVISE: Keeghan was educated on the multiple health risks of obesity as well as the benefit of weight loss to improve Sharon Rivers health. She was advised of the need for long term treatment and the importance of lifestyle modifications.  AGREE: Multiple dietary modification options and treatment options were discussed and  Oney agreed to follow the Category 3 plan We discussed the following Behavioral Modification Strategies today: increasing lean protein intake, keeping healthy foods in the home and holiday eating strategies

## 2016-08-03 NOTE — Assessment & Plan Note (Signed)
Stable on lisinopril. Continue same

## 2016-08-03 NOTE — Assessment & Plan Note (Signed)
Improved following medrol dose pak.  I encouraged her to continue to work on weight loss as this will help with her back pain.

## 2016-08-10 ENCOUNTER — Other Ambulatory Visit: Payer: Self-pay | Admitting: Physician Assistant

## 2016-08-12 ENCOUNTER — Other Ambulatory Visit: Payer: Self-pay | Admitting: Family

## 2016-08-16 ENCOUNTER — Encounter: Payer: Self-pay | Admitting: Family

## 2016-08-16 ENCOUNTER — Ambulatory Visit: Payer: BLUE CROSS/BLUE SHIELD | Admitting: Neurology

## 2016-08-18 ENCOUNTER — Encounter: Payer: Self-pay | Admitting: Family

## 2016-08-18 ENCOUNTER — Ambulatory Visit (INDEPENDENT_AMBULATORY_CARE_PROVIDER_SITE_OTHER): Payer: BLUE CROSS/BLUE SHIELD | Admitting: Family

## 2016-08-18 VITALS — BP 131/87 | HR 74 | Temp 98.1°F | Resp 18 | Ht 63.0 in | Wt 270.8 lb

## 2016-08-18 DIAGNOSIS — R350 Frequency of micturition: Secondary | ICD-10-CM

## 2016-08-18 DIAGNOSIS — M545 Low back pain, unspecified: Secondary | ICD-10-CM

## 2016-08-18 LAB — URINALYSIS, ROUTINE W REFLEX MICROSCOPIC
BILIRUBIN URINE: NEGATIVE
HGB URINE DIPSTICK: NEGATIVE
KETONES UR: NEGATIVE
LEUKOCYTES UA: NEGATIVE
NITRITE: NEGATIVE
RBC / HPF: NONE SEEN (ref 0–?)
Total Protein, Urine: NEGATIVE
UROBILINOGEN UA: 0.2 (ref 0.0–1.0)
Urine Glucose: NEGATIVE
pH: 6 (ref 5.0–8.0)

## 2016-08-18 MED ORDER — MELOXICAM 7.5 MG PO TABS: 7.5000 mg | ORAL_TABLET | Freq: Every day | ORAL | 0 refills | Status: DC

## 2016-08-18 NOTE — Patient Instructions (Addendum)
Please stop by the lab on the way out. You will be contacted about your MRI. Begin meloxicam once daily for your back pain. Go to the ER if you develop bowel/bladder incontinence.

## 2016-08-18 NOTE — Progress Notes (Signed)
Subjective:    Patient ID: Sharon Rivers, female    DOB: 1970/06/18, 46 y.o.   MRN: [id number]  HPI  Sharon Rivers is a 46 yr old female who presents today with chief complaint of low back pain.  Pain is located in the middle of her back.  Pain radiates down the back of the left leg.  Reports that she sneezed on Monday AM and then pain began suddenly.  She denies LE weakness or numbness.  She denies bowel/bladder incontinence.  She does have some urinary frequency.   We saw the patient for similar back pain in the end of May. At that time she had some associated radiculopathy. She was prescribed a Medrol Dosepak and when necessary Flexeril. Notes that she had brief relief with Medrol dose pak.   Review of Systems    see HPI  Past Medical History:  Diagnosis Date  . Anxiety   . Back pain   . Chest pain    a. 07/2015 Myoview: EF 71%, medium size, mild intensity, partially reversible septal defect with overlying breast attenuation -> likely artifact. No significant reversible ischemia.  . Constipation   . Depression   . Edema    feet and legs  . Frequent headaches   . GERD (gastroesophageal reflux disease)   . Hypertension   . Hypothyroidism   . Joint pain   . Migraines   . OSA (obstructive sleep apnea) 03/17/2016  . Osteoarthritis   . Panic anxiety syndrome   . SOB (shortness of breath)    a. 04/2016 Echo: EF 60-65%, Gr1 DD.  [name] Swallowing difficulty      Social History   Social History  . Marital status: Single    Spouse name: N/A  . Number of children: N/A  . Years of education: N/A   Occupational History  . Assembler     [employer]   Social History Main Topics  . Smoking status: Former Research scientist (life sciences)  . Smokeless tobacco: Never Used     Comment: quit 1992  . Alcohol use No  . Drug use: No  . Sexual activity: Yes    Partners: Female    Birth control/ protection: Pill     Comment: OCP   Other Topics Concern  . Not on file   Social History Narrative   She has 2  children (grown). She did not retain custody of these children.   Lives with boyfriend in [place].   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     Past Surgical History:  Procedure Laterality Date  . CESAREAN SECTION  2001 & 2002  . ELBOW SURGERY    . MINOR CARPAL TUNNEL     pinched nerve in elbow  . WISDOM TOOTH EXTRACTION      Family History  Problem Relation Age of Onset  . Hypertension Mother        Living  . Arthritis Mother   . Thyroid disease Mother   . Heart disease Mother        MI at age 72  . Heart attack Mother   . Migraines Mother   . Depression Mother   . Obesity Mother   . Hypertension Maternal Grandmother   . Parkinson's disease Maternal Grandmother   . Alzheimer's disease Maternal Grandmother   . Cancer Maternal Grandfather   . Hypertension Maternal Grandfather   . Hypertension Paternal Grandmother   . Hypertension Paternal Grandfather   . Gout Brother   . ADD / ADHD Son  x1  . Other Son        #2-Unknown  . Diabetes Neg Hx     Allergies  Allergen Reactions  . Adhesive [Tape] Other (See Comments)    Skin Burn.  [name] [Menthol (Topical Analgesic)] Other (See Comments)    Skin Burn.  . Sulfa Antibiotics Rash    Current Outpatient Prescriptions on File Prior to Visit  Medication Sig Dispense Refill  . fluticasone (FLONASE) 50 MCG/ACT nasal spray Place 2 sprays into both nostrils daily. 16 g 0  . gabapentin (NEURONTIN) 300 MG capsule Take 300 mg by mouth 3 (three) times daily.    [name] levothyroxine (SYNTHROID, LEVOTHROID) 175 MCG tablet TAKE 1 TABLET(175 MCG) BY MOUTH DAILY BEFORE BREAKFAST 30 tablet 5  . lisinopril (PRINIVIL,ZESTRIL) 20 MG tablet TAKE 1 TABLET(20 MG) BY MOUTH DAILY 30 tablet 2  . loratadine (CLARITIN) 10 MG tablet Take 1 tablet (10 mg total) by mouth daily. 30 tablet 11  . metFORMIN (GLUCOPHAGE) 500 MG tablet Take 1 tablet (500 mg total) by mouth daily with breakfast. 30 tablet 0  . NORLYDA 0.35 MG tablet TAKE 1  TABLET(0.35 MG) BY MOUTH DAILY 28 tablet 11  . ondansetron (ZOFRAN) 4 MG tablet Take 1 tablet (4 mg total) by mouth every 8 (eight) hours as needed for nausea or vomiting. 20 tablet 0  . pantoprazole (PROTONIX) 40 MG tablet TAKE 1 TABLET(40 MG) BY MOUTH DAILY 30 tablet 5  . SUMAtriptan (IMITREX) 100 MG tablet Take 1 tablet earliest onset of migraine.  May repeat once in 2 hours if headache persists or recurs.  Do not exceed more than 2 tablets in 24 hours 10 tablet 2  . topiramate (TOPAMAX) 100 MG tablet Take 1 tablet (100 mg total) by mouth at bedtime. 30 tablet 0  . venlafaxine XR (EFFEXOR-XR) 37.5 MG 24 hr capsule TAKE 1 CAPSULE BY MOUTH EVERY DAY FOR 3 DAYS. THEN INCREASE TO 2 CAPSULE CAPSULES DAILY 60 capsule 0  . Vitamin D, Ergocalciferol, (DRISDOL) 50000 units CAPS capsule Take 1 capsule (50,000 Units total) by mouth every 7 (seven) days. 4 capsule 0   No current facility-administered medications on file prior to visit.     BP 131/87 (BP Location: Right Arm, Cuff Size: Large)   Pulse 74   Temp 98.1 F (36.7 C) (Oral)   Resp 18   Ht 5\' 3"  (1.6 m)   Wt 270 lb 12.8 oz (122.8 kg)   LMP 08/07/2016   SpO2 100%   BMI 47.97 kg/m    Objective:   Physical Exam  Constitutional: She is oriented to person, place, and time. She appears well-developed and well-nourished.  Cardiovascular: Normal rate, regular rhythm and normal heart sounds.   No murmur heard. Pulmonary/Chest: Effort normal and breath sounds normal. No respiratory distress. She has no wheezes.  Musculoskeletal: She exhibits no edema.       Cervical back: She exhibits no tenderness.       Thoracic back: She exhibits no tenderness.       Lumbar back: She exhibits tenderness.  Neurological: She is alert and oriented to person, place, and time.  Reflex Scores:      Patellar reflexes are 2+ on the right side and 2+ on the left side. Bilateral LE strength is 5/5  Psychiatric: She has a normal mood and affect. Her behavior is  normal. Judgment and thought content normal.          Assessment & Plan:  Low back pain with  radiculopathy- Uncontrolled- recurrent, long-term pain. Will obtain mri of the lumbar spine for further evaluation.  Rx provided for meloxicam.    Urinary frequency- obtain UA/culture.

## 2016-08-19 LAB — URINE CULTURE: ORGANISM ID, BACTERIA: NO GROWTH

## 2016-08-24 ENCOUNTER — Ambulatory Visit (INDEPENDENT_AMBULATORY_CARE_PROVIDER_SITE_OTHER): Payer: BLUE CROSS/BLUE SHIELD | Admitting: Family Medicine

## 2016-08-24 VITALS — BP 111/76 | HR 76 | Temp 98.0°F | Ht 63.0 in | Wt 261.0 lb

## 2016-08-24 DIAGNOSIS — IMO0001 Reserved for inherently not codable concepts without codable children: Secondary | ICD-10-CM

## 2016-08-24 DIAGNOSIS — R7303 Prediabetes: Secondary | ICD-10-CM

## 2016-08-24 DIAGNOSIS — F3289 Other specified depressive episodes: Secondary | ICD-10-CM

## 2016-08-24 DIAGNOSIS — E669 Obesity, unspecified: Secondary | ICD-10-CM

## 2016-08-24 DIAGNOSIS — Z6841 Body Mass Index (BMI) 40.0 and over, adult: Secondary | ICD-10-CM

## 2016-08-24 DIAGNOSIS — E559 Vitamin D deficiency, unspecified: Secondary | ICD-10-CM

## 2016-08-24 MED ORDER — METFORMIN HCL 500 MG PO TABS: 500.0000 mg | ORAL_TABLET | Freq: Every day | ORAL | 0 refills | Status: DC

## 2016-08-24 MED ORDER — VITAMIN D (ERGOCALCIFEROL) 1.25 MG (50000 UNIT) PO CAPS: 50000.0000 [IU] | ORAL_CAPSULE | ORAL | 0 refills | Status: DC

## 2016-08-24 MED ORDER — TOPIRAMATE 100 MG PO TABS: 100.0000 mg | ORAL_TABLET | Freq: Every day | ORAL | 0 refills | Status: DC

## 2016-08-24 NOTE — Progress Notes (Signed)
Office: [phone]  /  Fax: [phone]   HPI:   Chief Complaint: OBESITY Sharon Rivers is here to discuss her progress with her obesity treatment plan. She is on the  follow the Category 3 plan and is following her eating plan approximately 80 % of the time. She states she is not walking as much due to back pain. Sharon Rivers notes increased cravings and boredom eating. She has done well maintaining her weight. Her weight is   today and has maintained weight over a period of 3 weeks since her last visit. She has lost 19 lbs since starting treatment with [name].  Vitamin D deficiency Sharon Rivers has a diagnosis of vitamin D deficiency. She is currently stable on vit D and denies nausea, vomiting or muscle weakness.  Pre-Diabetes Sharon Rivers has a diagnosis of pre-diabetes based on her elevated Hgb A1c and was informed this puts her at greater risk of developing diabetes. She is stable on metformin currently and continues to work on diet and exercise to decrease risk of diabetes. She denies nausea or hypoglycemia.  Depression with emotional eating behaviors Sharon Rivers is currently stable on topamax. She is struggling with some emotional eating but is more aware. Sharon Rivers struggles with emotional eating and using food for comfort to the extent that it is negatively impacting her health. She often snacks when she is not hungry. Sharon Rivers sometimes feels she is out of control and then feels guilty that she made poor food choices. She has been working on behavior modification techniques to help reduce her emotional eating and has been somewhat successful. She denies insomnia and shows no sign of suicidal or homicidal ideations.  Depression screen Sharon Rivers, Sharon Rivers 2/9 03/10/2016 11/12/2015  Decreased Interest 3 0  Down, Depressed, Hopeless 3 1  PHQ - 2 Score 6 1  Altered sleeping 3 -  Tired, decreased energy 3 -  Change in appetite 3 -  Feeling bad or failure about yourself  3 -  Trouble concentrating 3 -  Moving slowly or  fidgety/restless 2 -  PHQ-9 Score 23 -     ALLERGIES: Allergies  Allergen Reactions   Adhesive [Tape] Other (See Comments)    Skin Burn.   Biofreeze [Menthol (Topical Analgesic)] Other (See Comments)    Skin Burn.   Sulfa Antibiotics Rash    MEDICATIONS: Current Outpatient Prescriptions on File Prior to Visit  Medication Sig Dispense Refill   fluticasone (FLONASE) 50 MCG/ACT nasal spray Place 2 sprays into both nostrils daily. 16 g 0   gabapentin (NEURONTIN) 300 MG capsule Take 300 mg by mouth 3 (three) times daily.     levothyroxine (SYNTHROID, LEVOTHROID) 175 MCG tablet TAKE 1 TABLET(175 MCG) BY MOUTH DAILY BEFORE BREAKFAST 30 tablet 5   lisinopril (PRINIVIL,ZESTRIL) 20 MG tablet TAKE 1 TABLET(20 MG) BY MOUTH DAILY 30 tablet 2   loratadine (CLARITIN) 10 MG tablet Take 1 tablet (10 mg total) by mouth daily. 30 tablet 11   meloxicam (MOBIC) 7.5 MG tablet Take 1 tablet (7.5 mg total) by mouth daily. 14 tablet 0   NORLYDA 0.35 MG tablet TAKE 1 TABLET(0.35 MG) BY MOUTH DAILY 28 tablet 11   ondansetron (ZOFRAN) 4 MG tablet Take 1 tablet (4 mg total) by mouth every 8 (eight) hours as needed for nausea or vomiting. 20 tablet 0   pantoprazole (PROTONIX) 40 MG tablet TAKE 1 TABLET(40 MG) BY MOUTH DAILY 30 tablet 5   SUMAtriptan (IMITREX) 100 MG tablet Take 1 tablet earliest onset of migraine.  May repeat once in  2 hours if headache persists or recurs.  Do not exceed more than 2 tablets in 24 hours 10 tablet 2   venlafaxine XR (EFFEXOR-XR) 37.5 MG 24 hr capsule TAKE 1 CAPSULE BY MOUTH EVERY DAY FOR 3 DAYS. THEN INCREASE TO 2 CAPSULE CAPSULES DAILY 60 capsule 0   No current facility-administered medications on file prior to visit.     PAST MEDICAL HISTORY: Past Medical History:  Diagnosis Date   Anxiety    Back pain    Chest pain    a. 07/2015 Myoview: EF 71%, medium size, mild intensity, partially reversible septal defect with overlying breast attenuation -> likely  artifact. No significant reversible ischemia.   Constipation    Depression    Edema    feet and legs   Frequent headaches    GERD (gastroesophageal reflux disease)    Hypertension    Hypothyroidism    Joint pain    Migraines    OSA (obstructive sleep apnea) 03/17/2016   Osteoarthritis    Panic anxiety syndrome    SOB (shortness of breath)    a. 04/2016 Echo: EF 60-65%, Gr1 DD.   Swallowing difficulty     PAST SURGICAL HISTORY: Past Surgical History:  Procedure Laterality Date   CESAREAN SECTION  2001 & 2002   ELBOW SURGERY     MINOR CARPAL TUNNEL     pinched nerve in elbow   WISDOM TOOTH EXTRACTION      SOCIAL HISTORY: Social History  Substance Use Topics   Smoking status: Former Smoker   Smokeless tobacco: Never Used     Comment: quit 1992   Alcohol use No    FAMILY HISTORY: Family History  Problem Relation Age of Onset   Hypertension Mother        Living   Arthritis Mother    Thyroid disease Mother    Heart disease Mother        MI at age 40   Heart attack Mother    Migraines Mother    Depression Mother    Obesity Mother    Hypertension Maternal Grandmother    Parkinson's disease Maternal Grandmother    Alzheimer's disease Maternal Grandmother    Cancer Maternal Grandfather    Hypertension Maternal Grandfather    Hypertension Paternal Grandmother    Hypertension Paternal Grandfather    Gout Brother    ADD / ADHD Son        x1   Other Son        #2-Unknown   Diabetes Neg Hx     ROS: Review of Systems  Constitutional: Negative for weight loss.  Gastrointestinal: Negative for nausea and vomiting.  Musculoskeletal:       Negative muscle weakness  Endo/Heme/Allergies:       Negative hypoglycemia  Psychiatric/Behavioral: Positive for depression. Negative for suicidal ideas. The patient does not have insomnia.     PHYSICAL EXAM: Blood pressure 111/76, pulse 76, temperature 98 F (36.7 C), temperature source  Oral, last menstrual period 08/07/2016, SpO2 98 %. There is no height or weight on file to calculate BMI. Physical Exam  Constitutional: She is oriented to person, place, and time. She appears well-developed and well-nourished.  Cardiovascular: Normal rate.   Pulmonary/Chest: Effort normal.  Musculoskeletal: Normal range of motion.  Neurological: She is oriented to person, place, and time.  Skin: Skin is warm and dry.  Psychiatric: She has a normal mood and affect. Her behavior is normal.  Vitals reviewed.   RECENT LABS AND  TESTS: BMET    Component Value Date/Time   NA 140 06/27/2016 1629   NA 136 03/10/2016 1051   K 3.5 06/27/2016 1629   CL 108 06/27/2016 1629   CO2 25 06/27/2016 1629   GLUCOSE 77 06/27/2016 1629   BUN 11 06/27/2016 1629   BUN 14 03/10/2016 1051   CREATININE 0.77 06/27/2016 1629   CALCIUM 9.3 06/27/2016 1629   GFRNONAA >60 03/22/2016 1828   GFRAA >60 03/22/2016 1828   Lab Results  Component Value Date   HGBA1C 4.8 03/10/2016   HGBA1C 4.7 07/22/2015   Lab Results  Component Value Date   INSULIN 37.1 (H) 03/10/2016   CBC    Component Value Date/Time   WBC 7.1 06/27/2016 1629   RBC 4.27 06/27/2016 1629   HGB 12.4 06/27/2016 1629   HCT 36.4 06/27/2016 1629   PLT 356.0 06/27/2016 1629   MCV 85.1 06/27/2016 1629   MCH 28.9 03/22/2016 1828   MCHC 34.2 06/27/2016 1629   RDW 14.0 06/27/2016 1629   LYMPHSABS 1.7 06/27/2016 1629   MONOABS 0.6 06/27/2016 1629   EOSABS 0.2 06/27/2016 1629   BASOSABS 0.1 06/27/2016 1629   Iron/TIBC/Ferritin/ %Sat No results found for: IRON, TIBC, FERRITIN, IRONPCTSAT Lipid Panel     Component Value Date/Time   CHOL 123 03/10/2016 1051   TRIG 87 03/10/2016 1051   HDL 38 (L) 03/10/2016 1051   CHOLHDL 3 07/22/2015 0847   VLDL 11.2 07/22/2015 0847   LDLCALC 68 03/10/2016 1051   Hepatic Function Panel     Component Value Date/Time   PROT 7.1 06/27/2016 1629   PROT 7.2 03/10/2016 1051   ALBUMIN 4.1 06/27/2016  1629   ALBUMIN 4.2 03/10/2016 1051   AST 11 06/27/2016 1629   ALT 15 06/27/2016 1629   ALKPHOS 49 06/27/2016 1629   BILITOT 0.3 06/27/2016 1629   BILITOT 0.4 03/10/2016 1051      Component Value Date/Time   TSH 1.98 04/21/2016 0709   TSH 0.428 (L) 03/10/2016 1051   TSH 5.19 (H) 02/23/2016 1235    ASSESSMENT AND PLAN: Prediabetes - Plan: metFORMIN (GLUCOPHAGE) 500 MG tablet  Vitamin D deficiency - Plan: Vitamin D, Ergocalciferol, (DRISDOL) 50000 units CAPS capsule  Other depression - Plan: topiramate (TOPAMAX) 100 MG tablet  Class 3 obesity with serious comorbidity and body mass index (BMI) of 45.0 to 49.9 in adult, unspecified obesity type (Sharon Rivers)  PLAN:  Vitamin D Deficiency Sharon Rivers was informed that low vitamin D levels contributes to fatigue and are associated with obesity, breast, and colon cancer. She agrees to continue to take prescription Vit D @50 ,000 IU every week, we will refill for 1 month and will follow up for routine testing of vitamin D, at least 2-3 times per year. She was informed of the risk of over-replacement of vitamin D and agrees to not increase her dose unless he discusses this with [name] first. Sharon Rivers agrees to follow up with our clinic in 2 to 3 weeks.  Pre-Diabetes Sharon Rivers will continue to work on weight loss, exercise, and decreasing simple carbohydrates in her diet to help decrease the risk of diabetes. We dicussed metformin including benefits and risks. She was informed that eating too many simple carbohydrates or too many calories at one sitting increases the likelihood of GI side effects. Sharon Rivers agrees to continue metformin for now and a prescription was written today for 1 month refill. Sharon Rivers agreed to follow up with [name] as directed to monitor her progress.  Depression with Emotional  Eating Behaviors We discussed behavior modification techniques today to help Sharon Rivers deal with her emotional eating and depression. She has agreed to continue to take Topamax, we  will refill for 1 month and she will follow up as directed.  Obesity Sharon Rivers is currently in the action stage of change. As such, her goal is to continue with weight loss efforts She has agreed to follow the Category 3 plan Sharon Rivers has been instructed to work up to a goal of 150 minutes of combined cardio and strengthening exercise per week for weight loss and overall health benefits. We discussed the following Behavioral Modification Strategies today: increasing lean protein intake and ways to avoid boredom eating  Sharon Rivers has agreed to follow up with our clinic in 2 to 3 weeks. She was informed of the importance of frequent follow up visits to maximize her success with intensive lifestyle modifications for her multiple health conditions.  I, Sharon Rivers, am acting as transcriptionist for Sharon Nip, Sharon Rivers  I have reviewed the above documentation for accuracy and completeness, and I agree with the above. -Sharon Nip, Sharon Rivers   OBESITY BEHAVIORAL INTERVENTION VISIT  Today's visit was # 11 out of 22.  Starting weight: 280 lbs Starting date: 03/10/16 Today's weight : 261 lbs Today's date: 08/24/2016 Total lbs lost to date: 23 (Patients must lose 7 lbs in the first 6 months to continue with counseling)   ASK: We discussed the diagnosis of obesity with Sharon Rivers today and Sharon Rivers agreed to give [name] permission to discuss obesity behavioral modification therapy today.  ASSESS: Sharon Rivers has the diagnosis of obesity and her BMI today is 41.3 Sharon Rivers is in the action stage of change   ADVISE: Sharon Rivers was educated on the multiple health risks of obesity as well as the benefit of weight loss to improve her health. She was advised of the need for long term treatment and the importance of lifestyle modifications.  AGREE: Multiple dietary modification options and treatment options were discussed and  Sharon Rivers agreed to follow the Category 3 plan We discussed the following Behavioral Modification  Stratagies today: increasing lean protein intake and ways to avoid boredom eating

## 2016-08-27 ENCOUNTER — Ambulatory Visit (HOSPITAL_BASED_OUTPATIENT_CLINIC_OR_DEPARTMENT_OTHER)
Admission: RE | Admit: 2016-08-27 | Discharge: 2016-08-27 | Disposition: A | Discharge status: Home / Self Care | Payer: BLUE CROSS/BLUE SHIELD | Source: Ambulatory Visit | Attending: Family | Admitting: Family

## 2016-08-27 ENCOUNTER — Encounter: Payer: Self-pay | Admitting: Family

## 2016-08-27 DIAGNOSIS — M545 Low back pain, unspecified: Secondary | ICD-10-CM

## 2016-08-27 DIAGNOSIS — M5126 Other intervertebral disc displacement, lumbar region: Secondary | ICD-10-CM | POA: Insufficient documentation

## 2016-08-27 DIAGNOSIS — M5127 Other intervertebral disc displacement, lumbosacral region: Secondary | ICD-10-CM | POA: Diagnosis not present

## 2016-08-29 ENCOUNTER — Telehealth: Payer: Self-pay | Admitting: Family

## 2016-08-29 DIAGNOSIS — G8929 Other chronic pain: Secondary | ICD-10-CM

## 2016-08-29 DIAGNOSIS — M545 Low back pain: Principal | ICD-10-CM

## 2016-08-29 NOTE — Telephone Encounter (Signed)
See order(s).

## 2016-09-01 ENCOUNTER — Ambulatory Visit: Payer: BLUE CROSS/BLUE SHIELD | Attending: Family | Admitting: Physical Therapy

## 2016-09-01 DIAGNOSIS — G8929 Other chronic pain: Secondary | ICD-10-CM | POA: Diagnosis present

## 2016-09-01 DIAGNOSIS — R293 Abnormal posture: Secondary | ICD-10-CM | POA: Insufficient documentation

## 2016-09-01 DIAGNOSIS — M62830 Muscle spasm of back: Secondary | ICD-10-CM | POA: Diagnosis present

## 2016-09-01 DIAGNOSIS — M5442 Lumbago with sciatica, left side: Secondary | ICD-10-CM | POA: Insufficient documentation

## 2016-09-01 DIAGNOSIS — M6281 Muscle weakness (generalized): Secondary | ICD-10-CM | POA: Diagnosis present

## 2016-09-01 NOTE — Patient Instructions (Addendum)

## 2016-09-01 NOTE — Therapy (Signed)
[facility] [address]  [place], [place], [address] Phone: [phone]   Fax:  [phone]  Physical Therapy Evaluation  Patient Details  Name: Raseel Jans MRN: [id number] Date of Birth: 1970/08/08 Referring Provider: [name], NP  Encounter Date: 09/01/2016      PT End of Session - 09/01/16 1020    Visit Number 1   Number of Visits 12   Date for PT Re-Evaluation 10/14/16   Authorization Type [employer]   Authorization - Number of Visits 30   PT Start Time 1020   PT Stop Time 1105   PT Time Calculation (min) 45 min   Activity Tolerance Patient tolerated treatment well   Behavior During Therapy [facility] for tasks assessed/performed      Past Medical History:  Diagnosis Date  . Anxiety   . Back pain   . Chest pain    a. 07/2015 Myoview: EF 71%, medium size, mild intensity, partially reversible septal defect with overlying breast attenuation -> likely artifact. No significant reversible ischemia.  . Constipation   . Depression   . Edema    feet and legs  . Frequent headaches   . GERD (gastroesophageal reflux disease)   . Hypertension   . Hypothyroidism   . Joint pain   . Migraines   . OSA (obstructive sleep apnea) 03/17/2016  . Osteoarthritis   . Panic anxiety syndrome   . SOB (shortness of breath)    a. 04/2016 Echo: EF 60-65%, Gr1 DD.  [name] Swallowing difficulty     Past Surgical History:  Procedure Laterality Date  . CESAREAN SECTION  2001 & 2002  . ELBOW SURGERY    . MINOR CARPAL TUNNEL     pinched nerve in elbow  . WISDOM TOOTH EXTRACTION      There were no vitals filed for this visit.       Subjective Assessment - 09/01/16 1023    Subjective Pt reports having problems with her back for several years. Will try to get up out of bed or chair and back will pop unpredictably, ranging from discomfort to where she is unable to move. Now finding that she is having to use a cane at least 1x  month due to back pain. States MRI revealed bulging disks.   Limitations Sitting;Standing;Walking;House hold activities   How long can you sit comfortably? 15 minutes   How long can you stand comfortably? 5 minutes   How long can you walk comfortably? 30 minutes at slow pace   Diagnostic tests Lumbar MRI 08/27/16: Small left foraminal disc protrusion L1-2. Small left paracentral disc protrusion L5-S1 without neural compression.   Patient Stated Goals "my back to stop hurting"   Currently in Pain? Yes   Pain Score 3   worst 15/10, avg 3/10, least 3/10   Pain Location Back   Pain Orientation Lower;Left   Pain Descriptors / Indicators Nagging;Shooting   Pain Type Chronic pain   Pain Radiating Towards intermittent shooting pain and numbness/tingling down L leg to toes   Pain Onset More than a month ago   Pain Frequency Constant   Aggravating Factors  unpredictable - notes more often with supine to sit or sit to stand transitions   Pain Relieving Factors nothing   Effect of Pain on Daily Activities avoids heavy lifting, careful about how she moves            [facility] PT Assessment - 09/01/16 1020  Assessment   Medical Diagnosis L LBP with L sciatica   Referring Provider [name], NP   Next MD Visit none scheduled   Prior Therapy none for back; PT for R wrist/elbow & R knee     Balance Screen   Has the patient fallen in the past 6 months No   Has the patient had a decrease in activity level because of a fear of falling?  No   Is the patient reluctant to leave their home because of a fear of falling?  No     Home Environment   Living Environment Private residence   Home Access Level entry   [place] One level     Prior Function   Level of [place] Unemployed   Leisure clean house     Observation/Other Assessments   Focus on Therapeutic Outcomes (FOTO)  Lumbar spine - 36% (645 limitation); predicted 51% (49% limitation)      Posture/Postural Control   Posture/Postural Control Postural limitations   Postural Limitations Forward head;Rounded Shoulders;Decreased thoracic kyphosis;Increased lumbar lordosis;Anterior pelvic tilt     ROM / Strength   AROM / PROM / Strength AROM;Strength     AROM   Overall AROM  Deficits;Due to pain  and tightness   AROM Assessment Site Lumbar   Lumbar Flexion hands to anterior knees   Lumbar Extension 25% - popping sensation   Lumbar - Right Side Bend hand to lateral knee   Lumbar - Left Side Bend hand to lateral knee   Lumbar - Right Rotation WFL   Lumbar - Left Rotation [facility]     Strength   Strength Assessment Site Hip;Knee   Right/Left Hip Right;Left   Right Hip Flexion 4/5   Right Hip Extension 3-/5   Right Hip External Rotation  4-/5   Right Hip Internal Rotation 3+/5   Right Hip ABduction 4-/5   Right Hip ADduction 4-/5   Left Hip Flexion 4/5   Left Hip Extension 3-/5   Left Hip External Rotation 4-/5   Left Hip Internal Rotation 3+/5   Left Hip ABduction 3+/5   Left Hip ADduction 3/5   Right/Left Knee Right;Left   Right Knee Flexion 4/5   Right Knee Extension 4+/5   Left Knee Flexion 4/5   Left Knee Extension 4+/5     Flexibility   Soft Tissue Assessment /Muscle Length yes   Hamstrings mild-R, mod-L  tight    Quadriceps mild/mod tight hip flexors & RF   ITB mild/mod tight   Piriformis WFL            Objective measurements completed on examination: See above findings.          [facility] Adult PT Treatment/Exercise - 09/01/16 1020      Self-Care   Self-Care Posture   Posture Posture & body mechanics handout provided - will review info at next visit     Exercises   Exercises Lumbar     Lumbar Exercises: Stretches   Passive Hamstring Stretch 30 seconds;1 rep   Passive Hamstring Stretch Limitations supine with strap   Single Knee to Chest Stretch 30 seconds;1 rep   Single Knee to Chest Stretch Limitations opp hip extended   ITB Stretch 30  seconds;1 rep   ITB Stretch Limitations supine with strap     Lumbar Exercises: Supine   Ab Set 10 reps;5 seconds   AB Set Limitations + pelivc tilt   Bridge 10 reps;5 seconds  PT Education - 09/01/16 1105    Education provided Yes   Education Details PT eval findings, anticipated POC, education on neutral spine posture (posture & body mechanics handout provided - will review at next viist); initial HEP   Person(s) Educated Patient   Methods Explanation;Demonstration;Handout   Comprehension Verbalized understanding;Returned demonstration;Need further instruction          PT Short Term Goals - 09/01/16 1150      PT SHORT TERM GOAL #1   Title Independent with initial HEP   Status New   Target Date 09/16/16     PT SHORT TERM GOAL #2   Title Pt will verbalize understanding of neutral spine posture   Status New   Target Date 09/16/16           PT Long Term Goals - 09/01/16 1151      PT LONG TERM GOAL #1   Title Independent with ongoing HEP   Status New   Target Date 10/14/16     PT LONG TERM GOAL #2   Title Lumbar ROM WFL w/o limitation due to pain or tightness   Status New   Target Date 10/14/16     PT LONG TERM GOAL #3   Title B hip strength >/= 4/5 for improved stability   Status New   Target Date 10/14/16     PT LONG TERM GOAL #4   Title Pt will demonstrate neutral spine and good body mechanics with transitions and simulated household chores   Status New   Target Date 10/14/16                Plan - 09/01/16 1105    Clinical Impression Statement Sharon Rivers is a 46 y/o female who presents to OP PT for chronic low back pain with intermittent L sciatica. Pt reports several year h/o LBP, typically at constant nagging level (3/10) but intermittently and unpredictably up to "99/24" with certain movements. L sided sciatica with shooting pain, numbness and tingling also reported intermittently, with pt reporting need for use of cane with  ambulation when present. Recent lumbar MRI on 08/27/16 revealed small left foraminal disc protrusion L1-2 and small left paracentral disc protrusion L5-S1 without neural compression. Assessment reveals decreased lumbar ROM in all planes except B rotation, with restrictions due to pain and tightness. Proximal LE flexibility mild to moderately limited, L > R. Mild to moderate weakness present in proximal LE musculature (L > R), greatest in hip extension, adduction and rotation. Pain and weakness causes pt to avoid heavy lifting and be very cautious in how she moves. Pt demonstrates good potential to benefit from skilled PT for postural training with emphasis on neutral spine alignment, increasing LE flexibility, core/lumbar stabilization and LE strengthening. May consider dry needling for pain/increased muscle tension as well as traction and modalities as indicated for pain and radicular symptoms.   History and Personal Factors relevant to plan of care: R carpal tunnel & elbow surgery; R knee surgery; morbid obesity; OA   Clinical Presentation Stable   Clinical Presentation due to: chronicity of problem w/o acute exacerbation   Clinical Decision Making Low   Rehab Potential Good   Clinical Impairments Affecting Rehab Potential R carpal tunnel & elbow surgery; R knee surgery; morbid obesity; OA   PT Frequency 2x / week   PT Duration 6 weeks   PT Treatment/Interventions Patient/family education;Neuromuscular re-education;ADLs/Self Care Home Management;Therapeutic exercise;Therapeutic activities;Functional mobility training;Manual techniques;Dry needling;Taping;Electrical Stimulation;Moist Heat;Cryotherapy;Iontophoresis 4mg /ml Dexamethasone;Traction   PT Next Visit Plan  review initial HEP and provide posture & body mechanics training; proximal LE flexibility; core stabilization/LE strengthening; manual therapy for increased muscle tension as indicated; mechanical traction; modalities PRN   Consulted and Agree  with Plan of Care Patient      Patient will benefit from skilled therapeutic intervention in order to improve the following deficits and impairments:  Pain, Increased muscle spasms, Postural dysfunction, Impaired flexibility, Decreased range of motion, Decreased strength, Decreased activity tolerance, Improper body mechanics  Visit Diagnosis: Chronic left-sided low back pain with left-sided sciatica  Abnormal posture  Muscle spasm of back  Muscle weakness (generalized)     Problem List Patient Active Problem List   Diagnosis Date Noted  . Low back pain 08/03/2016  . Insulin resistance 06/13/2016  . Vitamin D deficiency 04/12/2016  . OSA (obstructive sleep apnea) 03/17/2016  . Insomnia 02/23/2016  . GERD (gastroesophageal reflux disease) 02/23/2016  . Migraines 02/23/2016  . Depression 02/23/2016  . Hypertension 07/22/2015  . Lateral epicondylitis 07/22/2015  . Hypothyroid 07/22/2015  . Family history of early CAD 07/22/2015  . Carpal tunnel syndrome 04/30/2015  . Morbid obesity ([place]) 09/12/2013    Percival Spanish, PT, MPT 09/01/2016, 12:08 PM  [facility] [address]  [place], [place], [address] Phone: [phone]   Fax:  [phone]  Name: Tandra Rosado MRN: [id number] Date of Birth: 20-Mar-1970

## 2016-09-05 ENCOUNTER — Ambulatory Visit: Payer: BLUE CROSS/BLUE SHIELD

## 2016-09-05 DIAGNOSIS — M62830 Muscle spasm of back: Secondary | ICD-10-CM

## 2016-09-05 DIAGNOSIS — R293 Abnormal posture: Secondary | ICD-10-CM

## 2016-09-05 DIAGNOSIS — M5442 Lumbago with sciatica, left side: Secondary | ICD-10-CM | POA: Diagnosis not present

## 2016-09-05 DIAGNOSIS — G8929 Other chronic pain: Secondary | ICD-10-CM

## 2016-09-05 DIAGNOSIS — M6281 Muscle weakness (generalized): Secondary | ICD-10-CM

## 2016-09-05 NOTE — Therapy (Signed)
[facility] [address]  [place], [place], [address] Phone: [phone]   Fax:  [phone]  Physical Therapy Treatment  Patient Details  Name: Sharon Rivers MRN: [id number] Date of Birth: 12-30-1970 Referring Provider: [name], NP  Encounter Date: 09/05/2016      PT End of Session - 09/05/16 1038    Visit Number 2   Number of Visits 12   Date for PT Re-Evaluation 10/14/16   Authorization Type BCBS   Authorization - Number of Visits 30   PT Start Time 1020   PT Stop Time 1106   PT Time Calculation (min) 46 min   Activity Tolerance Patient tolerated treatment well   Behavior During Therapy [facility] for tasks assessed/performed      Past Medical History:  Diagnosis Date  . Anxiety   . Back pain   . Chest pain    a. 07/2015 Myoview: EF 71%, medium size, mild intensity, partially reversible septal defect with overlying breast attenuation -> likely artifact. No significant reversible ischemia.  . Constipation   . Depression   . Edema    feet and legs  . Frequent headaches   . GERD (gastroesophageal reflux disease)   . Hypertension   . Hypothyroidism   . Joint pain   . Migraines   . OSA (obstructive sleep apnea) 03/17/2016  . Osteoarthritis   . Panic anxiety syndrome   . SOB (shortness of breath)    a. 04/2016 Echo: EF 60-65%, Gr1 DD.  [name] Swallowing difficulty     Past Surgical History:  Procedure Laterality Date  . CESAREAN SECTION  2001 & 2002  . ELBOW SURGERY    . MINOR CARPAL TUNNEL     pinched nerve in elbow  . WISDOM TOOTH EXTRACTION      There were no vitals filed for this visit.      Subjective Assessment - 09/05/16 1022    Subjective Pt. noting some difficulty with HEP stretching.     Patient Stated Goals "my back to stop hurting"   Currently in Pain? Yes   Pain Score 4    Pain Location Back   Pain Orientation Left;Lower   Pain Descriptors / Indicators Pressure   Pain Type  Chronic pain   Pain Radiating Towards no radicular symptoms   Pain Frequency Constant   Pain Relieving Factors nothing    Multiple Pain Sites No                         [facility] Adult PT Treatment/Exercise - 09/05/16 1032      Lumbar Exercises: Stretches   Passive Hamstring Stretch 2 reps;30 seconds   Passive Hamstring Stretch Limitations supine with strap   Single Knee to Chest Stretch 2 reps;30 seconds   Single Knee to Chest Stretch Limitations opp hip extended   ITB Stretch 30 seconds;2 reps   ITB Stretch Limitations supine with strap     Lumbar Exercises: Aerobic   Stationary Bike NuStep: lvl 4, 6 min      Lumbar Exercises: Supine   Ab Set 10 reps;5 seconds   AB Set Limitations + pelivc tilt; pt. with limited motions    Clam 15 reps;3 seconds   Bent Knee Raise 15 reps;3 seconds   Bent Knee Raise Limitations L only; into green TB at knees with abdom. brace    Bridge 10 reps;5 seconds   Other Supine Lumbar Exercises Hooklying bridge with sustained  hip abd/ER with green TB x 10 reps    Other Supine Lumbar Exercises Hooklying adduction ball squeeze 5" x 10 reps      Lumbar Exercises: Sidelying   Clam 10 reps;3 seconds   Clam Limitations R sidelying L clam shell with green TB                   PT Short Term Goals - 09/05/16 1041      PT SHORT TERM GOAL #1   Title Independent with initial HEP   Status On-going     PT SHORT TERM GOAL #2   Title Pt will verbalize understanding of neutral spine posture   Status On-going           PT Long Term Goals - 09/05/16 1041      PT LONG TERM GOAL #1   Title Independent with ongoing HEP   Status On-going     PT LONG TERM GOAL #2   Title Lumbar ROM WFL w/o limitation due to pain or tightness   Status On-going     PT LONG TERM GOAL #3   Title B hip strength >/= 4/5 for improved stability   Status New     PT LONG TERM GOAL #4   Title Pt will demonstrate neutral spine and good body mechanics with  transitions and simulated household chores   Status New               Plan - 09/05/16 1108    Clinical Impression Statement Pt. doing well today.  Good overall recall and technique with HEP review today.  Some progression in lumbopelvic strengthening activity today, which was tolerated well.  Posture and body mechanics with daily tasks and household chores reviewed with pt. today.  Pt. verbalizing that she feels she has had better awareness of sitting and standing posture since last therapy visit.  Ending treatment with pain bellow initial report thus, modalities deferred.   PT Treatment/Interventions Patient/family education;Neuromuscular re-education;ADLs/Self Care Home Management;Therapeutic exercise;Therapeutic activities;Functional mobility training;Manual techniques;Dry needling;Taping;Electrical Stimulation;Moist Heat;Cryotherapy;Iontophoresis 4mg /ml Dexamethasone;Traction   PT Next Visit Plan Proximal LE flexibility; core stabilization/LE strengthening; manual therapy for increased muscle tension as indicated; mechanical traction; modalities PRN      Patient will benefit from skilled therapeutic intervention in order to improve the following deficits and impairments:  Pain, Increased muscle spasms, Postural dysfunction, Impaired flexibility, Decreased range of motion, Decreased strength, Decreased activity tolerance, Improper body mechanics  Visit Diagnosis: Chronic left-sided low back pain with left-sided sciatica  Abnormal posture  Muscle spasm of back  Muscle weakness (generalized)     Problem List Patient Active Problem List   Diagnosis Date Noted  . Low back pain 08/03/2016  . Insulin resistance 06/13/2016  . Vitamin D deficiency 04/12/2016  . OSA (obstructive sleep apnea) 03/17/2016  . Insomnia 02/23/2016  . GERD (gastroesophageal reflux disease) 02/23/2016  . Migraines 02/23/2016  . Depression 02/23/2016  . Hypertension 07/22/2015  . Lateral epicondylitis  07/22/2015  . Hypothyroid 07/22/2015  . Family history of early CAD 07/22/2015  . Carpal tunnel syndrome 04/30/2015  . Morbid obesity ([place]) 09/12/2013    Bess Harvest, PTA 09/05/16 11:17 AM  [facility] [address]  [place], [place], [address] Phone: [phone]   Fax:  [phone]  Name: Sharon Rivers MRN: [id number] Date of Birth: [birth year]

## 2016-09-07 ENCOUNTER — Ambulatory Visit (INDEPENDENT_AMBULATORY_CARE_PROVIDER_SITE_OTHER): Payer: BLUE CROSS/BLUE SHIELD | Admitting: Family Medicine

## 2016-09-07 VITALS — BP 116/80 | HR 75 | Temp 99.0°F | Ht 63.0 in | Wt 260.0 lb

## 2016-09-07 DIAGNOSIS — R7303 Prediabetes: Secondary | ICD-10-CM | POA: Diagnosis not present

## 2016-09-07 DIAGNOSIS — E559 Vitamin D deficiency, unspecified: Secondary | ICD-10-CM | POA: Diagnosis not present

## 2016-09-07 DIAGNOSIS — Z6841 Body Mass Index (BMI) 40.0 and over, adult: Secondary | ICD-10-CM

## 2016-09-07 DIAGNOSIS — E669 Obesity, unspecified: Secondary | ICD-10-CM | POA: Diagnosis not present

## 2016-09-07 DIAGNOSIS — IMO0001 Reserved for inherently not codable concepts without codable children: Secondary | ICD-10-CM

## 2016-09-07 MED ORDER — METFORMIN HCL 500 MG PO TABS: 500.0000 mg | ORAL_TABLET | Freq: Two times a day (BID) | ORAL | 0 refills | Status: DC

## 2016-09-07 NOTE — Progress Notes (Signed)
Office: [phone]  /  Fax: [phone]   HPI:   Chief Complaint: OBESITY Sharon Rivers is here to discuss her progress with her obesity treatment plan. She is on the  follow the Category 3 plan and is following her eating plan approximately 85 % of the time. She states she is walking for 30 minutes 3 times per week. Sharon Rivers continues to do well with weight loss. She would like more options for lunch. Her weight is 260 lb (117.9 kg) today and has had a weight loss of 1 pound over a period of 2 weeks since her last visit. She has lost 20 lbs since starting treatment with [name].  Vitamin D deficiency Sharon Rivers has a diagnosis of vitamin D deficiency. She is currently taking vit D and denies nausea, vomiting or muscle weakness.  Pre-Diabetes Sharon Rivers has a diagnosis of pre-diabetes based on her elevated Hgb A1c and was informed this puts her at greater risk of developing diabetes. She is taking metformin currently and continues to work on diet and exercise to decrease risk of diabetes. She admits polyphagia in the evening and denies nausea or hypoglycemia.   ALLERGIES: Allergies  Allergen Reactions  . Adhesive [Tape] Other (See Comments)    Skin Burn.  Sharon Rivers [Menthol (Topical Analgesic)] Other (See Comments)    Skin Burn.  . Sulfa Antibiotics Rash    MEDICATIONS: Current Outpatient Prescriptions on File Prior to Visit  Medication Sig Dispense Refill  . fluticasone (FLONASE) 50 MCG/ACT nasal spray Place 2 sprays into both nostrils daily. 16 g 0  . gabapentin (NEURONTIN) 300 MG capsule Take 300 mg by mouth 3 (three) times daily.    Sharon Rivers levothyroxine (SYNTHROID, LEVOTHROID) 175 MCG tablet TAKE 1 TABLET(175 MCG) BY MOUTH DAILY BEFORE BREAKFAST 30 tablet 5  . lisinopril (PRINIVIL,ZESTRIL) 20 MG tablet TAKE 1 TABLET(20 MG) BY MOUTH DAILY 30 tablet 2  . loratadine (CLARITIN) 10 MG tablet Take 1 tablet (10 mg total) by mouth daily. 30 tablet 11  . meloxicam (MOBIC) 7.5 MG tablet Take 1 tablet (7.5  mg total) by mouth daily. 14 tablet 0  . NORLYDA 0.35 MG tablet TAKE 1 TABLET(0.35 MG) BY MOUTH DAILY 28 tablet 11  . ondansetron (ZOFRAN) 4 MG tablet Take 1 tablet (4 mg total) by mouth every 8 (eight) hours as needed for nausea or vomiting. 20 tablet 0  . pantoprazole (PROTONIX) 40 MG tablet TAKE 1 TABLET(40 MG) BY MOUTH DAILY 30 tablet 5  . SUMAtriptan (IMITREX) 100 MG tablet Take 1 tablet earliest onset of migraine.  May repeat once in 2 hours if headache persists or recurs.  Do not exceed more than 2 tablets in 24 hours 10 tablet 2  . topiramate (TOPAMAX) 100 MG tablet Take 1 tablet (100 mg total) by mouth at bedtime. 30 tablet 0  . venlafaxine XR (EFFEXOR-XR) 37.5 MG 24 hr capsule TAKE 1 CAPSULE BY MOUTH EVERY DAY FOR 3 DAYS. THEN INCREASE TO 2 CAPSULE CAPSULES DAILY 60 capsule 0  . Vitamin D, Ergocalciferol, (DRISDOL) 50000 units CAPS capsule Take 1 capsule (50,000 Units total) by mouth every 7 (seven) days. 4 capsule 0   No current facility-administered medications on file prior to visit.     PAST MEDICAL HISTORY: Past Medical History:  Diagnosis Date  . Anxiety   . Back pain   . Chest pain    a. 07/2015 Myoview: EF 71%, medium size, mild intensity, partially reversible septal defect with overlying breast attenuation -> likely artifact. No significant reversible  ischemia.  . Constipation   . Depression   . Edema    feet and legs  . Frequent headaches   . GERD (gastroesophageal reflux disease)   . Hypertension   . Hypothyroidism   . Joint pain   . Migraines   . OSA (obstructive sleep apnea) 03/17/2016  . Osteoarthritis   . Panic anxiety syndrome   . SOB (shortness of breath)    a. 04/2016 Echo: EF 60-65%, Gr1 DD.  Sharon Rivers Swallowing difficulty     PAST SURGICAL HISTORY: Past Surgical History:  Procedure Laterality Date  . CESAREAN SECTION  2001 & 2002  . ELBOW SURGERY    . MINOR CARPAL TUNNEL     pinched nerve in elbow  . WISDOM TOOTH EXTRACTION      SOCIAL  HISTORY: Social History  Substance Use Topics  . Smoking status: Former Research scientist (life sciences)  . Smokeless tobacco: Never Used     Comment: quit 1992  . Alcohol use No    FAMILY HISTORY: Family History  Problem Relation Age of Onset  . Hypertension Mother        Living  . Arthritis Mother   . Thyroid disease Mother   . Heart disease Mother        MI at age 57  . Heart attack Mother   . Migraines Mother   . Depression Mother   . Obesity Mother   . Hypertension Maternal Grandmother   . Parkinson's disease Maternal Grandmother   . Alzheimer's disease Maternal Grandmother   . Cancer Maternal Grandfather   . Hypertension Maternal Grandfather   . Hypertension Paternal Grandmother   . Hypertension Paternal Grandfather   . Gout Brother   . ADD / ADHD Son        x1  . Other Son        #2-Unknown  . Diabetes Neg Hx     ROS: Review of Systems  Constitutional: Positive for malaise/fatigue.  Gastrointestinal: Negative for nausea and vomiting.  Musculoskeletal:       Negative muscle weakness  Endo/Heme/Allergies:       Polyphagia Negative hypoglycemia    PHYSICAL EXAM: Blood pressure 116/80, pulse 75, temperature 99 F (37.2 C), temperature source Oral, height 5\' 3"  (1.6 m), weight 260 lb (117.9 kg), SpO2 98 %. Body mass index is 46.06 kg/m. Physical Exam  Constitutional: She is oriented to person, place, and time. She appears well-developed and well-nourished.  Cardiovascular: Normal rate.   Pulmonary/Chest: Effort normal.  Musculoskeletal: Normal range of motion.  Neurological: She is oriented to person, place, and time.  Skin: Skin is warm and dry.  Psychiatric: She has a normal mood and affect. Her behavior is normal.  Vitals reviewed.   RECENT LABS AND TESTS: BMET    Component Value Date/Time   NA 140 06/27/2016 1629   NA 136 03/10/2016 1051   K 3.5 06/27/2016 1629   CL 108 06/27/2016 1629   CO2 25 06/27/2016 1629   GLUCOSE 77 06/27/2016 1629   BUN 11 06/27/2016 1629    BUN 14 03/10/2016 1051   CREATININE 0.77 06/27/2016 1629   CALCIUM 9.3 06/27/2016 1629   GFRNONAA >60 03/22/2016 1828   GFRAA >60 03/22/2016 1828   Lab Results  Component Value Date   HGBA1C 4.8 03/10/2016   HGBA1C 4.7 07/22/2015   Lab Results  Component Value Date   INSULIN 37.1 (H) 03/10/2016   CBC    Component Value Date/Time   WBC 7.1 06/27/2016 1629  RBC 4.27 06/27/2016 1629   HGB 12.4 06/27/2016 1629   HCT 36.4 06/27/2016 1629   PLT 356.0 06/27/2016 1629   MCV 85.1 06/27/2016 1629   MCH 28.9 03/22/2016 1828   MCHC 34.2 06/27/2016 1629   RDW 14.0 06/27/2016 1629   LYMPHSABS 1.7 06/27/2016 1629   MONOABS 0.6 06/27/2016 1629   EOSABS 0.2 06/27/2016 1629   BASOSABS 0.1 06/27/2016 1629   Iron/TIBC/Ferritin/ %Sat No results found for: IRON, TIBC, FERRITIN, IRONPCTSAT Lipid Panel     Component Value Date/Time   CHOL 123 03/10/2016 1051   TRIG 87 03/10/2016 1051   HDL 38 (L) 03/10/2016 1051   CHOLHDL 3 07/22/2015 0847   VLDL 11.2 07/22/2015 0847   LDLCALC 68 03/10/2016 1051   Hepatic Function Panel     Component Value Date/Time   PROT 7.1 06/27/2016 1629   PROT 7.2 03/10/2016 1051   ALBUMIN 4.1 06/27/2016 1629   ALBUMIN 4.2 03/10/2016 1051   AST 11 06/27/2016 1629   ALT 15 06/27/2016 1629   ALKPHOS 49 06/27/2016 1629   BILITOT 0.3 06/27/2016 1629   BILITOT 0.4 03/10/2016 1051      Component Value Date/Time   TSH 1.98 04/21/2016 0709   TSH 0.428 (L) 03/10/2016 1051   TSH 5.19 (H) 02/23/2016 1235    ASSESSMENT AND PLAN: Prediabetes - Plan: metFORMIN (GLUCOPHAGE) 500 MG tablet  Vitamin D deficiency  Class 3 obesity with serious comorbidity and body mass index (BMI) of 45.0 to 49.9 in adult, unspecified obesity type ([place])  PLAN:  Vitamin D Deficiency Vana was informed that low vitamin D levels contributes to fatigue and are associated with obesity, breast, and colon cancer. She agrees to continue to take prescription Vit D @50 ,000 IU every  week and will follow up for routine testing of vitamin D, at least 2-3 times per year. She was informed of the risk of over-replacement of vitamin D and agrees to not increase her dose unless he discusses this with [name] first.  Pre-Diabetes Myishia will continue to work on weight loss, exercise, and decreasing simple carbohydrates in her diet to help decrease the risk of diabetes. We dicussed metformin including benefits and risks. She was informed that eating too many simple carbohydrates or too many calories at one sitting increases the likelihood of GI side effects. Omelia agrees to increase metformin to 500 mg bid and she will follow up with [name] as directed to monitor her progress.  Obesity Anistyn is currently in the action stage of change. As such, her goal is to continue with weight loss efforts She has agreed to follow the Category 3 plan Marilla has been instructed to work up to a goal of 150 minutes of combined cardio and strengthening exercise per week for weight loss and overall health benefits. We discussed the following Behavioral Modification Strategies today: increasing lean protein intake and better snacking choices  Nekeisha has agreed to follow up with our clinic in 2 to 3 weeks. She was informed of the importance of frequent follow up visits to maximize her success with intensive lifestyle modifications for her multiple health conditions.  I, Doreene Nest, am acting as transcriptionist for Lacy Duverney, PA-C  I have reviewed the above documentation for accuracy and completeness, and I agree with the above. -Lacy Duverney, PA-C  I have reviewed the above note and agree with the plan. -Dennard Nip, MD   OBESITY BEHAVIORAL INTERVENTION VISIT  Today's visit was # 12 out of 22.  Starting weight: 280  lbs Starting date: 03/10/16 Today's weight : 260 lbs Today's date: 09/07/2016 Total lbs lost to date: 20 (Patients must lose 7 lbs in the first 6 months to continue with  counseling)   ASK: We discussed the diagnosis of obesity with [name] today and Clariza agreed to give [name] permission to discuss obesity behavioral modification therapy today.  ASSESS: Royanne has the diagnosis of obesity and her BMI today is 56.2 Sole is in the action stage of change   ADVISE: Tyronza was educated on the multiple health risks of obesity as well as the benefit of weight loss to improve her health. She was advised of the need for long term treatment and the importance of lifestyle modifications.  AGREE: Multiple dietary modification options and treatment options were discussed and  Yameli agreed to follow the Category 3 plan We discussed the following Behavioral Modification Strategies today: increasing lean protein intake and better snacking choices

## 2016-09-08 ENCOUNTER — Ambulatory Visit: Payer: BLUE CROSS/BLUE SHIELD | Attending: Family Medicine | Admitting: Physical Therapy

## 2016-09-08 DIAGNOSIS — R293 Abnormal posture: Secondary | ICD-10-CM | POA: Insufficient documentation

## 2016-09-08 DIAGNOSIS — M6281 Muscle weakness (generalized): Secondary | ICD-10-CM | POA: Diagnosis present

## 2016-09-08 DIAGNOSIS — G8929 Other chronic pain: Secondary | ICD-10-CM | POA: Insufficient documentation

## 2016-09-08 DIAGNOSIS — M62830 Muscle spasm of back: Secondary | ICD-10-CM | POA: Diagnosis present

## 2016-09-08 DIAGNOSIS — M5442 Lumbago with sciatica, left side: Secondary | ICD-10-CM | POA: Insufficient documentation

## 2016-09-08 NOTE — Therapy (Signed)
[facility] [address]  [place], [place], [address] Phone: [phone]   Fax:  [phone]  Physical Therapy Treatment  Patient Details  Name: Sharon Rivers MRN: [id number] Date of Birth: 1970/09/21 Referring Provider: [name], NP  Encounter Date: 09/08/2016      PT End of Session - 09/08/16 1017    Visit Number 3   Number of Visits 12   Date for PT Re-Evaluation 10/14/16   Authorization Type BCBS   Authorization - Number of Visits 30   PT Start Time 1017   PT Stop Time 1119   PT Time Calculation (min) 62 min   Activity Tolerance Patient tolerated treatment well   Behavior During Therapy [facility] for tasks assessed/performed      Past Medical History:  Diagnosis Date  . Anxiety   . Back pain   . Chest pain    a. 07/2015 Myoview: EF 71%, medium size, mild intensity, partially reversible septal defect with overlying breast attenuation -> likely artifact. No significant reversible ischemia.  . Constipation   . Depression   . Edema    feet and legs  . Frequent headaches   . GERD (gastroesophageal reflux disease)   . Hypertension   . Hypothyroidism   . Joint pain   . Migraines   . OSA (obstructive sleep apnea) 03/17/2016  . Osteoarthritis   . Panic anxiety syndrome   . SOB (shortness of breath)    a. 04/2016 Echo: EF 60-65%, Gr1 DD.  [name] Swallowing difficulty     Past Surgical History:  Procedure Laterality Date  . CESAREAN SECTION  2001 & 2002  . ELBOW SURGERY    . MINOR CARPAL TUNNEL     pinched nerve in elbow  . WISDOM TOOTH EXTRACTION      There were no vitals filed for this visit.      Subjective Assessment - 09/08/16 1020    Subjective Pt reporting when she reached her car after last therapy session, her back started to spasm followed by localized initermittent "stinging" in mid low back. No further similiar symtpoms since then, but back pain remains constant at ~3/10.   Patient  Stated Goals "my back to stop hurting"   Currently in Pain? Yes   Pain Score 3    Pain Location Back   Pain Orientation Left;Lower   Pain Type Chronic pain   Pain Radiating Towards n/a   Pain Onset More than a month ago   Pain Frequency Constant   Aggravating Factors  transitions   Pain Relieving Factors uncertain                         [facility] Adult PT Treatment/Exercise - 09/08/16 1017      Lumbar Exercises: Stretches   Double Knee to Chest Stretch 20 seconds;5 reps   Double Knee to Chest Stretch Limitations heels on peanut ball   Lower Trunk Rotation 5 reps;10 seconds     Lumbar Exercises: Aerobic   Stationary Bike NuStep - lvl 4 x 6' (LE only)     Lumbar Exercises: Machines for Strengthening   Other Lumbar Machine Exercise BATCA row 15# x15 (narrow grip)     Lumbar Exercises: Standing   Heel Raises 20 reps;3 seconds   Heel Raises Limitations UE support on edge of sink   Functional Squats 15 reps;3 seconds   Functional Squats Limitations counter squat     Lumbar Exercises: Supine  Clam 15 reps;3 seconds   Clam Limitations alt hip ABD/ER with green TB   Bent Knee Raise 15 reps;3 seconds   Bent Knee Raise Limitations brace marching, no resistance     Lumbar Exercises: Sidelying   Clam 10 reps;3 seconds   Clam Limitations bil with green TB     Modalities   Modalities Electrical Stimulation;Moist Heat     Moist Heat Therapy   Number Minutes Moist Heat 15 Minutes   Moist Heat Location Lumbar Spine     Electrical Stimulation   Electrical Stimulation Location lumbar spine   Electrical Stimulation Action IFC   Electrical Stimulation Parameters 80-150 Hz, intensity to pt tol x15'   Electrical Stimulation Goals Pain;Tone                  PT Short Term Goals - 09/05/16 1041      PT SHORT TERM GOAL #1   Title Independent with initial HEP   Status On-going     PT SHORT TERM GOAL #2   Title Pt will verbalize understanding of neutral  spine posture   Status On-going           PT Long Term Goals - 09/05/16 1041      PT LONG TERM GOAL #1   Title Independent with ongoing HEP   Status On-going     PT LONG TERM GOAL #2   Title Lumbar ROM WFL w/o limitation due to pain or tightness   Status On-going     PT LONG TERM GOAL #3   Title B hip strength >/= 4/5 for improved stability   Status New     PT LONG TERM GOAL #4   Title Pt will demonstrate neutral spine and good body mechanics with transitions and simulated household chores   Status New               Plan - 09/08/16 1025    Clinical Impression Statement Pt reporting new muscle spasm followed by localized intermittent "stinging" in low back almost immediately following last PT session which has gradually resolved. Pt feels that addition of TB resisted hip abduction isometric to bridge was too challenging as tradtional bridge still requires a lot of effort, therefore decreased intensity of mat level exercises. Introduced upright exercises with emphasis on core activation with no c/o increased pain during exercises, but notingmuscle soreness following exercises. As such treatement concluded with estim and moist heat, wth pt noting improved muscle relaxation and decreased pain following this. Will look into insurance coverage for home TENS/IT unit.   Rehab Potential Good   Clinical Impairments Affecting Rehab Potential R carpal tunnel & elbow surgery; R knee surgery; morbid obesity; OA   PT Treatment/Interventions Patient/family education;Neuromuscular re-education;ADLs/Self Care Home Management;Therapeutic exercise;Therapeutic activities;Functional mobility training;Manual techniques;Dry needling;Taping;Electrical Stimulation;Moist Heat;Cryotherapy;Iontophoresis 4mg /ml Dexamethasone;Traction   PT Next Visit Plan F/u re: insurance coverage for home TENS/IT unit; proximal LE flexibility; core stabilization/LE strengthening; manual therapy for increased muscle  tension as indicated; mechanical traction; modalities PRN   Consulted and Agree with Plan of Care Patient      Patient will benefit from skilled therapeutic intervention in order to improve the following deficits and impairments:  Pain, Increased muscle spasms, Postural dysfunction, Impaired flexibility, Decreased range of motion, Decreased strength, Decreased activity tolerance, Improper body mechanics  Visit Diagnosis: Chronic left-sided low back pain with left-sided sciatica  Abnormal posture  Muscle spasm of back  Muscle weakness (generalized)     Problem List Patient Active Problem  List   Diagnosis Date Noted  . Low back pain 08/03/2016  . Insulin resistance 06/13/2016  . Vitamin D deficiency 04/12/2016  . OSA (obstructive sleep apnea) 03/17/2016  . Insomnia 02/23/2016  . GERD (gastroesophageal reflux disease) 02/23/2016  . Migraines 02/23/2016  . Depression 02/23/2016  . Hypertension 07/22/2015  . Lateral epicondylitis 07/22/2015  . Hypothyroid 07/22/2015  . Family history of early CAD 07/22/2015  . Carpal tunnel syndrome 04/30/2015  . Morbid obesity ([place]) 09/12/2013    Percival Spanish, PT, MPT 09/08/2016, 11:35 AM  [facility] [address]  [place], [place], [address] Phone: [phone]   Fax:  [phone]  Name: Sharon Rivers MRN: [id number] Date of Birth: 1970/10/27

## 2016-09-12 ENCOUNTER — Other Ambulatory Visit: Payer: Self-pay | Admitting: Family

## 2016-09-12 ENCOUNTER — Ambulatory Visit: Payer: BLUE CROSS/BLUE SHIELD | Admitting: Physical Therapy

## 2016-09-12 DIAGNOSIS — M62830 Muscle spasm of back: Secondary | ICD-10-CM

## 2016-09-12 DIAGNOSIS — G8929 Other chronic pain: Secondary | ICD-10-CM

## 2016-09-12 DIAGNOSIS — R293 Abnormal posture: Secondary | ICD-10-CM

## 2016-09-12 DIAGNOSIS — M6281 Muscle weakness (generalized): Secondary | ICD-10-CM

## 2016-09-12 DIAGNOSIS — M5442 Lumbago with sciatica, left side: Secondary | ICD-10-CM | POA: Diagnosis not present

## 2016-09-12 NOTE — Therapy (Signed)
[facility] [address]  [place], [place], [address] Phone: [phone]   Fax:  [phone]  Physical Therapy Treatment  Patient Details  Name: Sharon Rivers MRN: [id number] Date of Birth: 1970/08/21 Referring Provider: [name], NP  Encounter Date: 09/12/2016      PT End of Session - 09/12/16 1018    Visit Number 4   Number of Visits 12   Date for PT Re-Evaluation 10/14/16   Authorization Type BCBS   Authorization - Number of Visits 30   PT Start Time 1018   PT Stop Time 1114   PT Time Calculation (min) 56 min   Activity Tolerance Patient tolerated treatment well   Behavior During Therapy [facility] for tasks assessed/performed      Past Medical History:  Diagnosis Date  . Anxiety   . Back pain   . Chest pain    a. 07/2015 Myoview: EF 71%, medium size, mild intensity, partially reversible septal defect with overlying breast attenuation -> likely artifact. No significant reversible ischemia.  . Constipation   . Depression   . Edema    feet and legs  . Frequent headaches   . GERD (gastroesophageal reflux disease)   . Hypertension   . Hypothyroidism   . Joint pain   . Migraines   . OSA (obstructive sleep apnea) 03/17/2016  . Osteoarthritis   . Panic anxiety syndrome   . SOB (shortness of breath)    a. 04/2016 Echo: EF 60-65%, Gr1 DD.  [name] Swallowing difficulty     Past Surgical History:  Procedure Laterality Date  . CESAREAN SECTION  2001 & 2002  . ELBOW SURGERY    . MINOR CARPAL TUNNEL     pinched nerve in elbow  . WISDOM TOOTH EXTRACTION      There were no vitals filed for this visit.      Subjective Assessment - 09/12/16 1020    Subjective Pt reports she fell in her yard on Saturday - uncertain of how fall occurred but reports flailing and twisting trying to grab for something to stop the fall. Required assistance from her boyfriend to get up and back into the house. Now sore all over.   Patient Stated Goals "my back to stop hurting"   Currently in Pain? Yes   Pain Score 4    Pain Location Back   Pain Orientation Left;Lower   Pain Descriptors / Indicators Aching;Nagging;Pressure   Pain Type Chronic pain   Pain Frequency Constant                         [facility] Adult PT Treatment/Exercise - 09/12/16 1018      Self-Care   Self-Care Other Self-Care Comments   Other Self-Care Comments  instructed pt is self-STM to piriformis with small ball on wall     Lumbar Exercises: Stretches   Passive Hamstring Stretch 30 seconds;1 rep   Passive Hamstring Stretch Limitations supine with strap   Single Knee to Chest Stretch 30 seconds;2 reps   Single Knee to Chest Stretch Limitations opp hip fextended   Double Knee to Chest Stretch 20 seconds;5 reps   Double Knee to Chest Stretch Limitations heels on peanut ball   Lower Trunk Rotation 5 reps;20 seconds   Hip Flexor Stretch 30 seconds;2 reps   Hip Flexor Stretch Limitations L mod thomas   ITB Stretch 30 seconds;1 rep   ITB Stretch Limitations supine with strap  Piriformis Stretch 30 seconds;2 reps   Piriformis Stretch Limitations supine figure 4 with strap     Lumbar Exercises: Aerobic   Stationary Bike NuStep - lvl 4 x 6' (LE only)     Modalities   Modalities Electrical Stimulation;Moist Heat     Moist Heat Therapy   Number Minutes Moist Heat 15 Minutes   Moist Heat Location Lumbar Spine     Electrical Stimulation   Electrical Stimulation Location lumbar spine & L piriformis   Electrical Stimulation Action IFC   Electrical Stimulation Parameters 80-150 Hz, intensity to pt tol x15'   Electrical Stimulation Goals Pain;Tone     Manual Therapy   Manual Therapy Soft tissue mobilization;Myofascial release   Manual therapy comments pt in R sidelying with L LE on bolster   Soft tissue mobilization L piriformis & glutes   Myofascial Release IASTM/TPR with 4" ball to L piriformis                PT  Education - 09/12/16 1058    Education provided Yes   Education Details hip flexor & piriformis stretches; self-STM to piriformis with small ball   Person(s) Educated Patient   Methods Explanation;Demonstration;Handout   Comprehension Verbalized understanding;Returned demonstration;Need further instruction          PT Short Term Goals - 09/05/16 1041      PT SHORT TERM GOAL #1   Title Independent with initial HEP   Status On-going     PT SHORT TERM GOAL #2   Title Pt will verbalize understanding of neutral spine posture   Status On-going           PT Long Term Goals - 09/05/16 1041      PT LONG TERM GOAL #1   Title Independent with ongoing HEP   Status On-going     PT LONG TERM GOAL #2   Title Lumbar ROM WFL w/o limitation due to pain or tightness   Status On-going     PT LONG TERM GOAL #3   Title B hip strength >/= 4/5 for improved stability   Status New     PT LONG TERM GOAL #4   Title Pt will demonstrate neutral spine and good body mechanics with transitions and simulated household chores   Status New               Plan - 09/12/16 1058    Clinical Impression Statement Pt s/p new trauma from fall in the yard over the weekend with increased "all-over" soreness. Pt reports she has not attempted any of the HEP since the fall, therefore reviewed all stretches and introduced new stretches to address concerns of tightness/soreness in anterior hip/quads as well as piriformis. Pt noting some relief with this and manual therapy to L glutes & piriformis. Treatment concluded with estim & moist heat as pt reporting full day's relief of pain following this at last session. Will plan to gently resume strengthening program next visit.   Rehab Potential Good   Clinical Impairments Affecting Rehab Potential R carpal tunnel & elbow surgery; R knee surgery; morbid obesity; OA   PT Treatment/Interventions Patient/family education;Neuromuscular re-education;ADLs/Self Care Home  Management;Therapeutic exercise;Therapeutic activities;Functional mobility training;Manual techniques;Dry needling;Taping;Electrical Stimulation;Moist Heat;Cryotherapy;Iontophoresis 4mg /ml Dexamethasone;Traction   PT Next Visit Plan F/u re: insurance coverage for home TENS/IT unit; proximal LE flexibility; core stabilization/LE strengthening; manual therapy for increased muscle tension as indicated; mechanical traction; modalities PRN   Consulted and Agree with Plan of Care Patient  Patient will benefit from skilled therapeutic intervention in order to improve the following deficits and impairments:  Pain, Increased muscle spasms, Postural dysfunction, Impaired flexibility, Decreased range of motion, Decreased strength, Decreased activity tolerance, Improper body mechanics  Visit Diagnosis: Chronic left-sided low back pain with left-sided sciatica  Abnormal posture  Muscle spasm of back  Muscle weakness (generalized)     Problem List Patient Active Problem List   Diagnosis Date Noted  . Low back pain 08/03/2016  . Insulin resistance 06/13/2016  . Vitamin D deficiency 04/12/2016  . OSA (obstructive sleep apnea) 03/17/2016  . Insomnia 02/23/2016  . GERD (gastroesophageal reflux disease) 02/23/2016  . Migraines 02/23/2016  . Depression 02/23/2016  . Hypertension 07/22/2015  . Lateral epicondylitis 07/22/2015  . Hypothyroid 07/22/2015  . Family history of early CAD 07/22/2015  . Carpal tunnel syndrome 04/30/2015  . Morbid obesity ([place]) 09/12/2013    Percival Spanish, PT, MPT 09/12/2016, 12:36 PM  [facility] [address]  [place], [place], [address] Phone: [phone]   Fax:  [phone]  Name: Sharon Rivers MRN: [id number] Date of Birth: 1970/04/04

## 2016-09-15 ENCOUNTER — Other Ambulatory Visit: Payer: Self-pay | Admitting: Family

## 2016-09-15 ENCOUNTER — Ambulatory Visit: Payer: BLUE CROSS/BLUE SHIELD | Admitting: Physical Therapy

## 2016-09-15 DIAGNOSIS — M62830 Muscle spasm of back: Secondary | ICD-10-CM

## 2016-09-15 DIAGNOSIS — G8929 Other chronic pain: Secondary | ICD-10-CM

## 2016-09-15 DIAGNOSIS — M5442 Lumbago with sciatica, left side: Secondary | ICD-10-CM | POA: Diagnosis not present

## 2016-09-15 DIAGNOSIS — M6281 Muscle weakness (generalized): Secondary | ICD-10-CM

## 2016-09-15 DIAGNOSIS — R293 Abnormal posture: Secondary | ICD-10-CM

## 2016-09-15 DIAGNOSIS — I10 Essential (primary) hypertension: Secondary | ICD-10-CM

## 2016-09-15 NOTE — Therapy (Signed)
[facility] [address]  [place], [place], [address] Phone: [phone]   Fax:  [phone]  Physical Therapy Treatment  Patient Details  Name: Sharon Rivers MRN: [id number] Date of Birth: 1970-11-01 Referring Provider: [name], NP  Encounter Date: 09/15/2016      PT End of Session - 09/15/16 1102    Visit Number 5   Number of Visits 12   Date for PT Re-Evaluation 10/14/16   Authorization Type BCBS   Authorization - Number of Visits 30   PT Start Time 1102   PT Stop Time 1151   PT Time Calculation (min) 49 min   Activity Tolerance Patient tolerated treatment well   Behavior During Therapy [facility] for tasks assessed/performed      Past Medical History:  Diagnosis Date  . Anxiety   . Back pain   . Chest pain    a. 07/2015 Myoview: EF 71%, medium size, mild intensity, partially reversible septal defect with overlying breast attenuation -> likely artifact. No significant reversible ischemia.  . Constipation   . Depression   . Edema    feet and legs  . Frequent headaches   . GERD (gastroesophageal reflux disease)   . Hypertension   . Hypothyroidism   . Joint pain   . Migraines   . OSA (obstructive sleep apnea) 03/17/2016  . Osteoarthritis   . Panic anxiety syndrome   . SOB (shortness of breath)    a. 04/2016 Echo: EF 60-65%, Gr1 DD.  [name] Swallowing difficulty     Past Surgical History:  Procedure Laterality Date  . CESAREAN SECTION  2001 & 2002  . ELBOW SURGERY    . MINOR CARPAL TUNNEL     pinched nerve in elbow  . WISDOM TOOTH EXTRACTION      There were no vitals filed for this visit.      Subjective Assessment - 09/15/16 1108    Subjective Pt reports she received a call from [facility] stating that she will have full insurance coverage for a home TENS/IT unit. Feels like she has recovered from her fall in the yard.   Patient Stated Goals "my back to stop hurting"   Currently in Pain? Yes   Pain Score 3    Pain Location Back   Pain Orientation Left;Lower                         [facility] Adult PT Treatment/Exercise - 09/15/16 1102      Lumbar Exercises: Stretches   Passive Hamstring Stretch 30 seconds;2 reps   Passive Hamstring Stretch Limitations seated - replacing supine stretch d/t increased L knee pain with supine with strap     Lumbar Exercises: Aerobic   Stationary Bike Rec bike - lvl 2 x 6'     Lumbar Exercises: Standing   Heel Raises 20 reps;3 seconds   Heel Raises Limitations UE support on edge of sink   Functional Squats 15 reps;3 seconds   Functional Squats Limitations counter squat   Row Both;15 reps;Theraband;Strengthening   Theraband Level (Row) Level 3 (Green)   Row Limitations staggered stance   Shoulder Extension Both;15 reps;Theraband;Strengthening   Theraband Level (Shoulder Extension) Level 3 (Green)   Shoulder Extension Limitations staggered stance     Lumbar Exercises: Supine   Clam 15 reps;3 seconds   Clam Limitations alt hip ABD/ER with green TB   Bent Knee Raise 15 reps;3 seconds   Bent Knee  Raise Limitations brace marching with looped green TB   Dead Bug 15 reps;3 seconds   Dead Bug Limitations from hooklying position with arms at 90 dg shoulder flexion   Isometric Hip Flexion 10 reps;5 seconds   Isometric Hip Flexion Limitations alt unilateral - discontinued d/t repeated cramping in R hip flexors     Lumbar Exercises: Sidelying   Clam 10 reps;3 seconds   Clam Limitations bil with green TB                PT Education - 09/15/16 1153    Education provided Yes   Education Details HEP update - modified hamstring stretch; lumbar/proximal LE strenghening progression   Person(s) Educated Patient   Methods Explanation;Demonstration;Handout   Comprehension Verbalized understanding;Returned demonstration          PT Short Term Goals - 09/15/16 1111      PT SHORT TERM GOAL #1   Title Independent with initial  HEP   Status Achieved     PT SHORT TERM GOAL #2   Title Pt will verbalize understanding of neutral spine posture   Status Achieved           PT Long Term Goals - 09/05/16 1041      PT LONG TERM GOAL #1   Title Independent with ongoing HEP   Status On-going     PT LONG TERM GOAL #2   Title Lumbar ROM WFL w/o limitation due to pain or tightness   Status On-going     PT LONG TERM GOAL #3   Title B hip strength >/= 4/5 for improved stability   Status New     PT LONG TERM GOAL #4   Title Pt will demonstrate neutral spine and good body mechanics with transitions and simulated household chores   Status New               Plan - 09/15/16 1154    Clinical Impression Statement Pt reporting increased pain/soreness from trauma of fall has subsided and pain is back to baseline at present. Reports good compilance with HEP but notes increased L knee pain wih hamstring stretch, therefore instructed pt in modified HS stretch in siitng with better tolerance reported. Able to tolerate lumbar/proximal LE strengthening progression w/o increased pain but limited tolerance for isometric hip flexion due to repeated muscle cramping. HEP updated to include some of strengthening progression.   Rehab Potential Good   Clinical Impairments Affecting Rehab Potential R carpal tunnel & elbow surgery; R knee surgery; morbid obesity; OA   PT Treatment/Interventions Patient/family education;Neuromuscular re-education;ADLs/Self Care Home Management;Therapeutic exercise;Therapeutic activities;Functional mobility training;Manual techniques;Dry needling;Taping;Electrical Stimulation;Moist Heat;Cryotherapy;Iontophoresis 4mg /ml Dexamethasone;Traction   PT Next Visit Plan issue home TENS/IT unit (full insurance coverage per pt phone call from [facility], but unit not available on 8/9); proximal LE flexibility; core stabilization/LE strengthening; manual therapy for increased muscle tension as indicated; mechanical  traction; modalities PRN   Consulted and Agree with Plan of Care Patient      Patient will benefit from skilled therapeutic intervention in order to improve the following deficits and impairments:  Pain, Increased muscle spasms, Postural dysfunction, Impaired flexibility, Decreased range of motion, Decreased strength, Decreased activity tolerance, Improper body mechanics  Visit Diagnosis: Chronic left-sided low back pain with left-sided sciatica  Abnormal posture  Muscle spasm of back  Muscle weakness (generalized)     Problem List Patient Active Problem List   Diagnosis Date Noted  . Low back pain 08/03/2016  . Insulin resistance 06/13/2016  .  Vitamin D deficiency 04/12/2016  . OSA (obstructive sleep apnea) 03/17/2016  . Insomnia 02/23/2016  . GERD (gastroesophageal reflux disease) 02/23/2016  . Migraines 02/23/2016  . Depression 02/23/2016  . Hypertension 07/22/2015  . Lateral epicondylitis 07/22/2015  . Hypothyroid 07/22/2015  . Family history of early CAD 07/22/2015  . Carpal tunnel syndrome 04/30/2015  . Morbid obesity ([place]) 09/12/2013    Percival Spanish, PT, MPT 09/15/2016, 12:01 PM  [facility] [address]  [place], [place], [address] Phone: [phone]   Fax:  [phone]  Name: Sharon Rivers MRN: [id number] Date of Birth: Dec 03, 1970

## 2016-09-19 ENCOUNTER — Ambulatory Visit: Payer: BLUE CROSS/BLUE SHIELD | Admitting: Physical Therapy

## 2016-09-19 DIAGNOSIS — M5442 Lumbago with sciatica, left side: Secondary | ICD-10-CM | POA: Diagnosis not present

## 2016-09-19 DIAGNOSIS — M6281 Muscle weakness (generalized): Secondary | ICD-10-CM

## 2016-09-19 DIAGNOSIS — G8929 Other chronic pain: Secondary | ICD-10-CM

## 2016-09-19 DIAGNOSIS — R293 Abnormal posture: Secondary | ICD-10-CM

## 2016-09-19 DIAGNOSIS — M62830 Muscle spasm of back: Secondary | ICD-10-CM

## 2016-09-19 NOTE — Therapy (Signed)
[facility] [address]  [place], [place], [address] Phone: [phone]   Fax:  [phone]  Physical Therapy Treatment  Patient Details  Name: Sharon Rivers MRN: [id number] Date of Birth: [birth year] Referring Provider: [name], NP  Encounter Date: 09/19/2016      PT End of Session - 09/19/16 1015    Visit Number 6   Number of Visits 12   Date for PT Re-Evaluation 10/14/16   Authorization Type BCBS   Authorization - Number of Visits 30   PT Start Time 1015   PT Stop Time 1102   PT Time Calculation (min) 47 min   Activity Tolerance Patient tolerated treatment well   Behavior During Therapy [facility] for tasks assessed/performed      Past Medical History:  Diagnosis Date  . Anxiety   . Back pain   . Chest pain    a. 07/2015 Myoview: EF 71%, medium size, mild intensity, partially reversible septal defect with overlying breast attenuation -> likely artifact. No significant reversible ischemia.  . Constipation   . Depression   . Edema    feet and legs  . Frequent headaches   . GERD (gastroesophageal reflux disease)   . Hypertension   . Hypothyroidism   . Joint pain   . Migraines   . OSA (obstructive sleep apnea) 03/17/2016  . Osteoarthritis   . Panic anxiety syndrome   . SOB (shortness of breath)    a. 04/2016 Echo: EF 60-65%, Gr1 DD.  [name] Swallowing difficulty     Past Surgical History:  Procedure Laterality Date  . CESAREAN SECTION  2001 & 2002  . ELBOW SURGERY    . MINOR CARPAL TUNNEL     pinched nerve in elbow  . WISDOM TOOTH EXTRACTION      There were no vitals filed for this visit.      Subjective Assessment - 09/19/16 1020    Subjective Pt reports she did a lot of walking over the weekend, but no flare-ups of her pain.   Patient Stated Goals "my back to stop hurting"   Currently in Pain? Yes   Pain Score 2    Pain Location Back   Pain Orientation Left;Lower   Pain Descriptors /  Indicators Aching;Nagging;Pressure   Pain Type Chronic pain   Pain Frequency Constant   Aggravating Factors  no flare-ups   Pain Relieving Factors uncertain                         [facility] Adult PT Treatment/Exercise - 09/19/16 1015      Self-Care   Other Self-Care Comments  Instructed pt in set-up & use of home TENS/IT unit.     Lumbar Exercises: Stretches   Quadruped Mid Back Stretch 30 seconds;3 reps   Quadruped Mid Back Stretch Limitations 3 way prayer stretch with green Pball     Lumbar Exercises: Aerobic   Stationary Bike NuStep - lvl 4 x 6' (LE only)     Lumbar Exercises: Standing   Row Both;20 reps;Theraband;Strengthening   Theraband Level (Row) Level 3 (Green)   Row Limitations staggered stance   Shoulder Extension Both;20 reps;Theraband;Strengthening   Theraband Level (Shoulder Extension) Level 3 (Green)   Shoulder Extension Limitations staggered stance   Other Standing Lumbar Exercises B pallof press with green TB x20 each     Lumbar Exercises: Supine   Dead Bug 15 reps;3 seconds   Dead Bug Limitations  from hooklying position with arms at 90 dg shoulder flexion   Isometric Hip Flexion 10 reps;5 seconds   Isometric Hip Flexion Limitations heels on orange Pball    Other Supine Lumbar Exercises LTR with heels on orange Pball 10x5"     Lumbar Exercises: Quadruped   [place] 10 reps   Single Arm Raise Right;Left;10 reps;3 seconds                PT Education - 09/19/16 1102    Education provided Yes   Education Details Seated 3 way prayer stretch; Instructions in use of home TENS/IT unit (unit issued)   Person(s) Educated Patient   Methods Explanation;Demonstration   Comprehension Verbalized understanding;Returned demonstration          PT Short Term Goals - 09/15/16 1111      PT SHORT TERM GOAL #1   Title Independent with initial HEP   Status Achieved     PT SHORT TERM GOAL #2   Title Pt will verbalize understanding of  neutral spine posture   Status Achieved           PT Long Term Goals - 09/05/16 1041      PT LONG TERM GOAL #1   Title Independent with ongoing HEP   Status On-going     PT LONG TERM GOAL #2   Title Lumbar ROM WFL w/o limitation due to pain or tightness   Status On-going     PT LONG TERM GOAL #3   Title B hip strength >/= 4/5 for improved stability   Status New     PT LONG TERM GOAL #4   Title Pt will demonstrate neutral spine and good body mechanics with transitions and simulated household chores   Status New               Plan - 09/19/16 1023    Clinical Impression Statement Pt noting improvement in low back pain since starting PT. Overall constant pain level has decreased and no longer experiencing as many "flare-ups". Able to walk a lot over the weekend including on inclines and noting able to maintain better posture w/o increased pain. Pt approved by insurance for home TENS/IT unit, therefore unit issued with training in set-up & use.   Rehab Potential Good   Clinical Impairments Affecting Rehab Potential R carpal tunnel & elbow surgery; R knee surgery; morbid obesity; OA   PT Treatment/Interventions Patient/family education;Neuromuscular re-education;ADLs/Self Care Home Management;Therapeutic exercise;Therapeutic activities;Functional mobility training;Manual techniques;Dry needling;Taping;Electrical Stimulation;Moist Heat;Cryotherapy;Iontophoresis 4mg /ml Dexamethasone;Traction   PT Next Visit Plan proximal LE flexibility; core stabilization/LE strengthening; manual therapy for increased muscle tension as indicated; mechanical traction; modalities PRN   Consulted and Agree with Plan of Care Patient      Patient will benefit from skilled therapeutic intervention in order to improve the following deficits and impairments:  Pain, Increased muscle spasms, Postural dysfunction, Impaired flexibility, Decreased range of motion, Decreased strength, Decreased activity  tolerance, Improper body mechanics  Visit Diagnosis: Chronic left-sided low back pain with left-sided sciatica  Abnormal posture  Muscle spasm of back  Muscle weakness (generalized)     Problem List Patient Active Problem List   Diagnosis Date Noted  . Low back pain 08/03/2016  . Insulin resistance 06/13/2016  . Vitamin D deficiency 04/12/2016  . OSA (obstructive sleep apnea) 03/17/2016  . Insomnia 02/23/2016  . GERD (gastroesophageal reflux disease) 02/23/2016  . Migraines 02/23/2016  . Depression 02/23/2016  . Hypertension 07/22/2015  . Lateral epicondylitis 07/22/2015  .  Hypothyroid 07/22/2015  . Family history of early CAD 07/22/2015  . Carpal tunnel syndrome 04/30/2015  . Morbid obesity ([place]) 09/12/2013    Percival Spanish, PT, MPT 09/19/2016, 12:05 PM  [facility] [address]  [place], [place], [address] Phone: [phone]   Fax:  [phone]  Name: Sharon Rivers MRN: [id number] Date of Birth: 31-Oct-1970

## 2016-09-21 ENCOUNTER — Telehealth: Payer: Self-pay | Admitting: Family

## 2016-09-21 NOTE — Telephone Encounter (Signed)
Sharon Rivers from [facility] dropped off a medical necessity form for Sharon Rivers to complete for the pt, documents placed in tray at front office

## 2016-09-22 ENCOUNTER — Ambulatory Visit: Payer: BLUE CROSS/BLUE SHIELD

## 2016-09-26 ENCOUNTER — Ambulatory Visit: Payer: BLUE CROSS/BLUE SHIELD

## 2016-09-26 ENCOUNTER — Ambulatory Visit (INDEPENDENT_AMBULATORY_CARE_PROVIDER_SITE_OTHER): Payer: BLUE CROSS/BLUE SHIELD | Admitting: Physician Assistant

## 2016-09-26 VITALS — BP 112/76 | HR 84 | Temp 98.2°F | Ht 63.0 in | Wt 261.0 lb

## 2016-09-26 DIAGNOSIS — Z6841 Body Mass Index (BMI) 40.0 and over, adult: Secondary | ICD-10-CM | POA: Diagnosis not present

## 2016-09-26 DIAGNOSIS — G8929 Other chronic pain: Secondary | ICD-10-CM

## 2016-09-26 DIAGNOSIS — E559 Vitamin D deficiency, unspecified: Secondary | ICD-10-CM

## 2016-09-26 DIAGNOSIS — M62830 Muscle spasm of back: Secondary | ICD-10-CM

## 2016-09-26 DIAGNOSIS — R293 Abnormal posture: Secondary | ICD-10-CM

## 2016-09-26 DIAGNOSIS — E8881 Metabolic syndrome: Secondary | ICD-10-CM | POA: Diagnosis not present

## 2016-09-26 DIAGNOSIS — E669 Obesity, unspecified: Secondary | ICD-10-CM | POA: Diagnosis not present

## 2016-09-26 DIAGNOSIS — M6281 Muscle weakness (generalized): Secondary | ICD-10-CM

## 2016-09-26 DIAGNOSIS — M5442 Lumbago with sciatica, left side: Principal | ICD-10-CM

## 2016-09-26 DIAGNOSIS — F3289 Other specified depressive episodes: Secondary | ICD-10-CM | POA: Diagnosis not present

## 2016-09-26 DIAGNOSIS — IMO0001 Reserved for inherently not codable concepts without codable children: Secondary | ICD-10-CM | POA: Insufficient documentation

## 2016-09-26 DIAGNOSIS — R632 Polyphagia: Secondary | ICD-10-CM | POA: Insufficient documentation

## 2016-09-26 MED ORDER — TOPIRAMATE 100 MG PO TABS: 100.0000 mg | ORAL_TABLET | Freq: Every day | ORAL | 0 refills | Status: DC

## 2016-09-26 MED ORDER — METFORMIN HCL 500 MG PO TABS: 500.0000 mg | ORAL_TABLET | Freq: Two times a day (BID) | ORAL | 0 refills | Status: DC

## 2016-09-26 MED ORDER — VITAMIN D (ERGOCALCIFEROL) 1.25 MG (50000 UNIT) PO CAPS: 50000.0000 [IU] | ORAL_CAPSULE | ORAL | 0 refills | Status: DC

## 2016-09-26 NOTE — Therapy (Signed)
[facility] [address]  [place], [place], [address] Phone: [phone]   Fax:  [phone]  Physical Therapy Treatment  Patient Details  Name: Sharon Rivers MRN: [id number] Date of Birth: Sep 03, 1970 Referring Provider: [name], NP  Encounter Date: 09/26/2016      PT End of Session - 09/26/16 1044    Visit Number 7   Number of Visits 12   Date for PT Re-Evaluation 10/14/16   Authorization Type [employer]   Authorization - Number of Visits 30   PT Start Time 1036  pt. arrived late    PT Stop Time 1100   PT Time Calculation (min) 24 min   Activity Tolerance Patient tolerated treatment well   Behavior During Therapy [facility] for tasks assessed/performed      Past Medical History:  Diagnosis Date  . Anxiety   . Back pain   . Chest pain    a. 07/2015 Myoview: EF 71%, medium size, mild intensity, partially reversible septal defect with overlying breast attenuation -> likely artifact. No significant reversible ischemia.  . Constipation   . Depression   . Edema    feet and legs  . Frequent headaches   . GERD (gastroesophageal reflux disease)   . Hypertension   . Hypothyroidism   . Joint pain   . Migraines   . OSA (obstructive sleep apnea) 03/17/2016  . Osteoarthritis   . Panic anxiety syndrome   . SOB (shortness of breath)    a. 04/2016 Echo: EF 60-65%, Gr1 DD.  [name] Swallowing difficulty     Past Surgical History:  Procedure Laterality Date  . CESAREAN SECTION  2001 & 2002  . ELBOW SURGERY    . MINOR CARPAL TUNNEL     pinched nerve in elbow  . WISDOM TOOTH EXTRACTION      There were no vitals filed for this visit.      Subjective Assessment - 09/26/16 1039    Subjective Pt. noting she feels she may "have a kidney infection".  Having some pain with urination.  Seeing MD later today regarding this.     Patient Stated Goals "my back to stop hurting"   Currently in Pain? Yes   Pain Score 2    Pain  Location Back   Pain Orientation Left;Lower   Pain Descriptors / Indicators Aching;Dull   Pain Type Chronic pain   Pain Onset More than a month ago   Pain Frequency Constant   Pain Relieving Factors uncertain   Multiple Pain Sites No                         [facility] Adult PT Treatment/Exercise - 09/26/16 1054      Lumbar Exercises: Stretches   Quadruped Mid Back Stretch 30 seconds;3 reps   Quadruped Mid Back Stretch Limitations 3 way prayer stretch with green Pball     Lumbar Exercises: Aerobic   Stationary Bike NuStep - lvl 4 x 6' (LE only)     Lumbar Exercises: Standing   Functional Squats 15 reps;3 seconds   Functional Squats Limitations counter squat   Row Right;Left;15 reps   Theraband Level (Row) Level 3 (Green)                  PT Short Term Goals - 09/15/16 1111      PT SHORT TERM GOAL #1   Title Independent with initial HEP   Status Achieved  PT SHORT TERM GOAL #2   Title Pt will verbalize understanding of neutral spine posture   Status Achieved           PT Long Term Goals - 09/26/16 1048      PT LONG TERM GOAL #1   Title Independent with ongoing HEP   Status On-going     PT LONG TERM GOAL #2   Title Lumbar ROM WFL w/o limitation due to pain or tightness   Status On-going     PT LONG TERM GOAL #3   Title B hip strength >/= 4/5 for improved stability   Status On-going     PT LONG TERM GOAL #4   Title Pt will demonstrate neutral spine and good body mechanics with transitions and simulated household chores   Status On-going               Plan - 09/26/16 1047    Clinical Impression Statement Pt. arrived to therapy nearly 20 min late, today thus treatment time limited.  Noting some pain with urination and feels, she may have a kidney infection.  Scheduled to see MD later today regarding this.  Pt. noting no longer having "flare-ups" with LBP.  Tolerated lumbopelvic strengthening activities well today.  May discuss  body mechanics in upcoming visit as pt. 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   PT Next Visit Plan proximal LE flexibility; core stabilization/LE strengthening; manual therapy for increased muscle tension as indicated; mechanical traction; modalities PRN      Patient will benefit from skilled therapeutic intervention in order to improve the following deficits and impairments:  Pain, Increased muscle spasms, Postural dysfunction, Impaired flexibility, Decreased range of motion, Decreased strength, Decreased activity tolerance, Improper body mechanics  Visit Diagnosis: Chronic left-sided low back pain with left-sided sciatica  Abnormal posture  Muscle spasm of back  Muscle weakness (generalized)     Problem List Patient Active Problem List   Diagnosis Date Noted  . Low back pain 08/03/2016  . Insulin resistance 06/13/2016  . Vitamin D deficiency 04/12/2016  . OSA (obstructive sleep apnea) 03/17/2016  . Insomnia 02/23/2016  . GERD (gastroesophageal reflux disease) 02/23/2016  . Migraines 02/23/2016  . Depression 02/23/2016  . Hypertension 07/22/2015  . Lateral epicondylitis 07/22/2015  . Hypothyroid 07/22/2015  . Family history of early CAD 07/22/2015  . Carpal tunnel syndrome 04/30/2015  . Morbid obesity ([place]) 09/12/2013    Bess Harvest, PTA 09/26/16 12:46 PM  [facility] [address]  [place], [place], [address] Phone: [phone]   Fax:  [phone]  Name: Sharon Rivers MRN: [id number] Date of Birth: 07-28-1970

## 2016-09-26 NOTE — Progress Notes (Addendum)
Office: [phone]  /  Fax: [phone]   HPI:   Chief Complaint: OBESITY Sharon Rivers is here to discuss her progress with her obesity treatment plan. She is on the Category 3 plan and is following her eating plan approximately 85 % of the time. She states she is walking 30 minutes 3 times per week. Sahaana has not been feeling as hungry and would skip meals occasionally or replace meals with protein shakes. Overall, she makes smarter food choices and controls her portions. Her weight is 261 lb (118.4 kg) today and has had a weight gain of 1 pound over a period of 3 weeks since her last visit. She has lost 19 lbs since starting treatment with [name].  Vitamin D deficiency Wyona has a diagnosis of vitamin D deficiency. She is currently taking vit D and denies nausea, vomiting or muscle weakness.  Insulin Resistance Tashauna has a diagnosis of insulin resistance based on her elevated fasting insulin level >5. Although Anyiah's blood glucose readings are still under good control, insulin resistance puts her at greater risk of metabolic syndrome and diabetes. She is taking metformin currently and continues to work on diet and exercise to decrease risk of diabetes.Allayna denies polyphagia.  Depression with emotional eating behaviors Caoimhe is struggling with emotional eating and using food for comfort to the extent that it is negatively impacting her health. She often snacks when she is not hungry. Katelyne sometimes feels she is out of control and then feels guilty that she made poor food choices. She has been working on behavior modification techniques to help reduce her emotional eating and has been somewhat successful. Her mood is stable and she shows no sign of suicidal or homicidal ideations.  Depression screen [facility] 2/9 03/10/2016 11/12/2015  Decreased Interest 3 0  Down, Depressed, Hopeless 3 1  PHQ - 2 Score 6 1  Altered sleeping 3 -  Tired, decreased energy 3 -  Change in appetite 3 -  Feeling  bad or failure about yourself  3 -  Trouble concentrating 3 -  Moving slowly or fidgety/restless 2 -  PHQ-9 Score 23 -      ALLERGIES: Allergies  Allergen Reactions  . Adhesive [Tape] Other (See Comments)    Skin Burn.  [name] [Menthol (Topical Analgesic)] Other (See Comments)    Skin Burn.  . Sulfa Antibiotics Rash    MEDICATIONS: Current Outpatient Prescriptions on File Prior to Visit  Medication Sig Dispense Refill  . fluticasone (FLONASE) 50 MCG/ACT nasal spray Place 2 sprays into both nostrils daily. 16 g 0  . gabapentin (NEURONTIN) 300 MG capsule Take 300 mg by mouth 3 (three) times daily.    [name] levothyroxine (SYNTHROID, LEVOTHROID) 175 MCG tablet TAKE 1 TABLET(175 MCG) BY MOUTH DAILY BEFORE BREAKFAST 30 tablet 5  . lisinopril (PRINIVIL,ZESTRIL) 20 MG tablet Take 1 tablet (20 mg total) by mouth daily. 30 tablet 2  . loratadine (CLARITIN) 10 MG tablet Take 1 tablet (10 mg total) by mouth daily. 30 tablet 11  . meloxicam (MOBIC) 7.5 MG tablet Take 1 tablet (7.5 mg total) by mouth daily. 14 tablet 0  . NORLYDA 0.35 MG tablet TAKE 1 TABLET(0.35 MG) BY MOUTH DAILY 28 tablet 11  . ondansetron (ZOFRAN) 4 MG tablet Take 1 tablet (4 mg total) by mouth every 8 (eight) hours as needed for nausea or vomiting. 20 tablet 0  . pantoprazole (PROTONIX) 40 MG tablet TAKE 1 TABLET(40 MG) BY MOUTH DAILY 30 tablet 5  . SUMAtriptan (IMITREX)  100 MG tablet Take 1 tablet earliest onset of migraine.  May repeat once in 2 hours if headache persists or recurs.  Do not exceed more than 2 tablets in 24 hours 10 tablet 2  . venlafaxine XR (EFFEXOR-XR) 37.5 MG 24 hr capsule Take 2 capsules (75 mg total) by mouth daily. 60 capsule 1   No current facility-administered medications on file prior to visit.     PAST MEDICAL HISTORY: Past Medical History:  Diagnosis Date  . Anxiety   . Back pain   . Chest pain    a. 07/2015 Myoview: EF 71%, medium size, mild intensity, partially reversible septal defect  with overlying breast attenuation -> likely artifact. No significant reversible ischemia.  . Constipation   . Depression   . Edema    feet and legs  . Frequent headaches   . GERD (gastroesophageal reflux disease)   . Hypertension   . Hypothyroidism   . Joint pain   . Migraines   . OSA (obstructive sleep apnea) 03/17/2016  . Osteoarthritis   . Panic anxiety syndrome   . SOB (shortness of breath)    a. 04/2016 Echo: EF 60-65%, Gr1 DD.  [name] Swallowing difficulty     PAST SURGICAL HISTORY: Past Surgical History:  Procedure Laterality Date  . CESAREAN SECTION  2001 & 2002  . ELBOW SURGERY    . MINOR CARPAL TUNNEL     pinched nerve in elbow  . WISDOM TOOTH EXTRACTION      SOCIAL HISTORY: Social History  Substance Use Topics  . Smoking status: Former Research scientist (life sciences)  . Smokeless tobacco: Never Used     Comment: quit 1992  . Alcohol use No    FAMILY HISTORY: Family History  Problem Relation Age of Onset  . Hypertension Mother        Living  . Arthritis Mother   . Thyroid disease Mother   . Heart disease Mother        MI at age 61  . Heart attack Mother   . Migraines Mother   . Depression Mother   . Obesity Mother   . Hypertension Maternal Grandmother   . Parkinson's disease Maternal Grandmother   . Alzheimer's disease Maternal Grandmother   . Cancer Maternal Grandfather   . Hypertension Maternal Grandfather   . Hypertension Paternal Grandmother   . Hypertension Paternal Grandfather   . Gout Brother   . ADD / ADHD Son        x1  . Other Son        #2-Unknown  . Diabetes Neg Hx     ROS: Review of Systems  Constitutional: Negative for weight loss.  Gastrointestinal: Negative for nausea and vomiting.  Musculoskeletal:       Negative muscle weakness  Endo/Heme/Allergies:       Negative polyphagia  Psychiatric/Behavioral: Positive for depression. Negative for suicidal ideas.    PHYSICAL EXAM: Blood pressure 112/76, pulse 84, temperature 98.2 F (36.8 C), temperature  source Oral, height 5\' 3"  (1.6 m), weight 261 lb (118.4 kg), SpO2 99 %. Body mass index is 46.23 kg/m. Physical Exam  Constitutional: She is oriented to person, place, and time. She appears well-developed and well-nourished.  Cardiovascular: Normal rate.   Pulmonary/Chest: Effort normal.  Musculoskeletal: Normal range of motion.  Neurological: She is oriented to person, place, and time.  Skin: Skin is warm and dry.  Psychiatric: She has a normal mood and affect. Her behavior is normal.  Vitals reviewed.   RECENT LABS  AND TESTS: BMET    Component Value Date/Time   NA 140 06/27/2016 1629   NA 136 03/10/2016 1051   K 3.5 06/27/2016 1629   CL 108 06/27/2016 1629   CO2 25 06/27/2016 1629   GLUCOSE 77 06/27/2016 1629   BUN 11 06/27/2016 1629   BUN 14 03/10/2016 1051   CREATININE 0.77 06/27/2016 1629   CALCIUM 9.3 06/27/2016 1629   GFRNONAA >60 03/22/2016 1828   GFRAA >60 03/22/2016 1828   Lab Results  Component Value Date   HGBA1C 4.8 03/10/2016   HGBA1C 4.7 07/22/2015   Lab Results  Component Value Date   INSULIN 37.1 (H) 03/10/2016   CBC    Component Value Date/Time   WBC 7.1 06/27/2016 1629   RBC 4.27 06/27/2016 1629   HGB 12.4 06/27/2016 1629   HCT 36.4 06/27/2016 1629   PLT 356.0 06/27/2016 1629   MCV 85.1 06/27/2016 1629   MCH 28.9 03/22/2016 1828   MCHC 34.2 06/27/2016 1629   RDW 14.0 06/27/2016 1629   LYMPHSABS 1.7 06/27/2016 1629   MONOABS 0.6 06/27/2016 1629   EOSABS 0.2 06/27/2016 1629   BASOSABS 0.1 06/27/2016 1629   Iron/TIBC/Ferritin/ %Sat No results found for: IRON, TIBC, FERRITIN, IRONPCTSAT Lipid Panel     Component Value Date/Time   CHOL 123 03/10/2016 1051   TRIG 87 03/10/2016 1051   HDL 38 (L) 03/10/2016 1051   CHOLHDL 3 07/22/2015 0847   VLDL 11.2 07/22/2015 0847   LDLCALC 68 03/10/2016 1051   Hepatic Function Panel     Component Value Date/Time   PROT 7.1 06/27/2016 1629   PROT 7.2 03/10/2016 1051   ALBUMIN 4.1 06/27/2016 1629     ALBUMIN 4.2 03/10/2016 1051   AST 11 06/27/2016 1629   ALT 15 06/27/2016 1629   ALKPHOS 49 06/27/2016 1629   BILITOT 0.3 06/27/2016 1629   BILITOT 0.4 03/10/2016 1051      Component Value Date/Time   TSH 1.98 04/21/2016 0709   TSH 0.428 (L) 03/10/2016 1051   TSH 5.19 (H) 02/23/2016 1235    ASSESSMENT AND PLAN: Vitamin D deficiency - Plan: Vitamin D, Ergocalciferol, (DRISDOL) 50000 units CAPS capsule  Insulin resistance - Plan: metFORMIN (GLUCOPHAGE) 500 MG tablet  Other depression - Plan: topiramate (TOPAMAX) 100 MG tablet  Class 3 obesity with serious comorbidity and body mass index (BMI) of 45.0 to 49.9 in adult, unspecified obesity type ([place])  PLAN:  Vitamin D Deficiency Bricelyn was informed that low vitamin D levels contributes to fatigue and are associated with obesity, breast, and colon cancer. She agrees to continue to take prescription Vit D @50 ,000 IU every week, we will refill for 1 month and will follow up for routine testing of vitamin D, at least 2-3 times per year. She was informed of the risk of over-replacement of vitamin D and agrees to not increase her dose unless he discusses this with [name] first. Riti agrees to follow up with our clinic in 3 weeks.  Insulin Resistance Alvis will continue to work on weight loss, exercise, and decreasing simple carbohydrates in her diet to help decrease the risk of diabetes. We dicussed metformin including benefits and risks. She was informed that eating too many simple carbohydrates or too many calories at one sitting increases the likelihood of GI side effects. Haydan agrees to continue metformin for now and prescription was written today for 1 month refill. Braniyah agreed to follow up with [name] as directed to monitor her progress.  Depression with  Emotional Eating Behaviors We discussed behavior modification techniques today to help Edan deal with her emotional eating and depression. She has agreed to continue Topamax 100 mg qd  #30 with no refills. Odette agreed to follow up as directed.  Obesity Ticara is currently in the action stage of change. As such, her goal is to continue with weight loss efforts She has agreed to follow the Category 3 plan Corlene has been instructed to work up to a goal of 150 minutes of combined cardio and strengthening exercise per week for weight loss and overall health benefits. We discussed the following Behavioral Modification Strategies today: no skipping meals and increasing lean protein intake  Kingslee has agreed to follow up with our clinic in 3 weeks. She was informed of the importance of frequent follow up visits to maximize her success with intensive lifestyle modifications for her multiple health conditions.  I, Doreene Nest, am acting as transcriptionist for Lacy Duverney, PA-C  I have reviewed the above documentation for accuracy and completeness, and I agree with the above. -Lacy Duverney, PA-C    OBESITY BEHAVIORAL INTERVENTION VISIT  Today's visit was # 13 out of 22.  Starting weight: 280 lbs Starting date: 03/10/16 Today's weight : 261 lbs Today's date: 09/27/2016 Total lbs lost to date: 76 (Patients must lose 7 lbs in the first 6 months to continue with counseling)   ASK: We discussed the diagnosis of obesity with [name] today and Alys agreed to give [name] permission to discuss obesity behavioral modification therapy today.  ASSESS: Merri has the diagnosis of obesity and her BMI today is 60.3 Chellsea is in the action stage of change   ADVISE: Judah was educated on the multiple health risks of obesity as well as the benefit of weight loss to improve her health. She was advised of the need for long term treatment and the importance of lifestyle modifications.  AGREE: Multiple dietary modification options and treatment options were discussed and  Finlee agreed to follow the Category 3 plan We discussed the following Behavioral Modification Strategies today:  no skipping meals and increasing lean protein intake

## 2016-09-26 NOTE — Telephone Encounter (Signed)
Paperwork for Letter of Medical Necessity & certification of Need for TENS Unit forwarded to provider/SLS 08/20

## 2016-09-29 ENCOUNTER — Ambulatory Visit: Payer: BLUE CROSS/BLUE SHIELD | Admitting: Physical Therapy

## 2016-09-29 DIAGNOSIS — M62830 Muscle spasm of back: Secondary | ICD-10-CM

## 2016-09-29 DIAGNOSIS — R293 Abnormal posture: Secondary | ICD-10-CM

## 2016-09-29 DIAGNOSIS — M5442 Lumbago with sciatica, left side: Secondary | ICD-10-CM | POA: Diagnosis not present

## 2016-09-29 DIAGNOSIS — G8929 Other chronic pain: Secondary | ICD-10-CM

## 2016-09-29 DIAGNOSIS — M6281 Muscle weakness (generalized): Secondary | ICD-10-CM

## 2016-09-29 NOTE — Therapy (Addendum)
[facility] [address]  [place], [place], [address] Phone: [phone]   Fax:  [phone]  Physical Therapy Treatment  Patient Details  Name: Sharon Rivers MRN: [id number] Date of Birth: 03-30-1970 Referring Provider: [name], NP  Encounter Date: 09/29/2016      PT End of Session - 09/29/16 1000    Visit Number 8   Number of Visits 12   Date for PT Re-Evaluation 10/14/16   Authorization Type BC[employer]   Authorization - Number of Visits 30   PT Start Time 1000   PT Stop Time 1044   PT Time Calculation (min) 44 min   Activity Tolerance Patient tolerated treatment well   Behavior During Therapy [facility] for tasks assessed/performed      Past Medical History:  Diagnosis Date  . Anxiety   . Back pain   . Chest pain    a. 07/2015 Myoview: EF 71%, medium size, mild intensity, partially reversible septal defect with overlying breast attenuation -> likely artifact. No significant reversible ischemia.  . Constipation   . Depression   . Edema    feet and legs  . Frequent headaches   . GERD (gastroesophageal reflux disease)   . Hypertension   . Hypothyroidism   . Joint pain   . Migraines   . OSA (obstructive sleep apnea) 03/17/2016  . Osteoarthritis   . Panic anxiety syndrome   . SOB (shortness of breath)    a. 04/2016 Echo: EF 60-65%, Gr1 DD.  [name] Swallowing difficulty     Past Surgical History:  Procedure Laterality Date  . CESAREAN SECTION  2001 & 2002  . ELBOW SURGERY    . MINOR CARPAL TUNNEL     pinched nerve in elbow  . WISDOM TOOTH EXTRACTION      There were no vitals filed for this visit.      Subjective Assessment - 09/29/16 1005    Subjective Pt reports she has a new job - will be working for [employer] in [employer], but sure exactly what her job will entail.   Patient Stated Goals "my back to stop hurting"   Currently in Pain? No/denies   Pain Score 0-No pain   Pain Onset  --                         [facility] Adult PT Treatment/Exercise - 09/29/16 1000      Self-Care   Posture Reviewed posture and body mechanics as relevant to expected job tasks necessary for new job, emphasizing avoid excessive twisting motions and turning body rather than twsting.     Lumbar Exercises: Aerobic   Stationary Bike Rec bike - lvl 2 x 6'     Lumbar Exercises: Machines for Strengthening   Other Lumbar Machine Exercise BATCA B UE low row 25# x15 (narrow grip), B UE mid row 20# x15 (wide grip), alt unilateral low row 20# x15 (narrow grip)   Other Lumbar Machine Exercise BATCA upward cable pull 5# x10     Lumbar Exercises: Standing   Wall Slides 15 reps;5 seconds   Row Both;15 reps;Theraband;Strengthening   Theraband Level (Row) Level 4 (Blue)   Row Limitations staggered stance   Shoulder Extension Both;15 reps;Theraband;Strengthening   Theraband Level (Shoulder Extension) Level 4 (Blue)   Shoulder Extension Limitations staggered stance   Other Standing Lumbar Exercises B pallof press with blue TB x15 each  Lumbar Exercises: Quadruped   Single Arm Raise Right;Left;10 reps;3 seconds   Single Arm Raises Limitations torso over peanut ball   Straight Leg Raise 10 reps;3 seconds                PT Education - 09/29/16 1044    Education provided Yes   Education Details HEP update - standing rows & pallof press   Person(s) Educated Patient   Methods Explanation;Demonstration;Handout   Comprehension Verbalized understanding;Returned demonstration          PT Short Term Goals - 09/15/16 1111      PT SHORT TERM GOAL #1   Title Independent with initial HEP   Status Achieved     PT SHORT TERM GOAL #2   Title Pt will verbalize understanding of neutral spine posture   Status Achieved           PT Long Term Goals - 09/26/16 1048      PT LONG TERM GOAL #1   Title Independent with ongoing HEP   Status On-going     PT LONG TERM GOAL #2    Title Lumbar ROM WFL w/o limitation due to pain or tightness   Status On-going     PT LONG TERM GOAL #3   Title B hip strength >/= 4/5 for improved stability   Status On-going     PT LONG TERM GOAL #4   Title Pt will demonstrate neutral spine and good body mechanics with transitions and simulated household chores   Status On-going               Plan - 09/29/16 1007    Clinical Impression Statement Sharon Rivers reporting pain free today. States she got the job she was applying for. Not fully certain of job responsibilities but anticipates standing with some rotation and pulling required, therefore reviewed lumbar safety concerns as well as proper posture and body mechanics for expected type of work. Lumbar strenghening exercises focused on UE activities to help prepare for new job responsibilities and updated HEP accordingly.   Rehab Potential Good   Clinical Impairments Affecting Rehab Potential R carpal tunnel & elbow surgery; R knee surgery; morbid obesity; OA   PT Next Visit Plan proximal LE flexibility; core stabilization/LE strengthening; manual therapy for increased muscle tension as indicated; mechanical traction; modalities PRN   Consulted and Agree with Plan of Care Patient      Patient will benefit from skilled therapeutic intervention in order to improve the following deficits and impairments:  Pain, Increased muscle spasms, Postural dysfunction, Impaired flexibility, Decreased range of motion, Decreased strength, Decreased activity tolerance, Improper body mechanics  Visit Diagnosis: Chronic left-sided low back pain with left-sided sciatica  Abnormal posture  Muscle spasm of back  Muscle weakness (generalized)     Problem List Patient Active Problem List   Diagnosis Date Noted  . Polyphagia 09/26/2016  . Class 3 obesity with serious comorbidity and body mass index (BMI) of 45.0 to 49.9 in adult ([place]) 09/26/2016  . Low back pain 08/03/2016  . Insulin resistance  06/13/2016  . Vitamin D deficiency 04/12/2016  . OSA (obstructive sleep apnea) 03/17/2016  . Insomnia 02/23/2016  . GERD (gastroesophageal reflux disease) 02/23/2016  . Migraines 02/23/2016  . Depression 02/23/2016  . Hypertension 07/22/2015  . Lateral epicondylitis 07/22/2015  . Hypothyroid 07/22/2015  . Family history of early CAD 07/22/2015  . Carpal tunnel syndrome 04/30/2015  . Morbid obesity ([place]) 09/12/2013    Percival Spanish, PT, MPT  09/29/2016, 12:24 PM  [facility] [address]  [place], [place], [address] Phone: [phone]   Fax:  [phone]  Name: Sharon Rivers MRN: [id number] Date of Birth: 06-03-1970  PHYSICAL THERAPY DISCHARGE SUMMARY  Visits from Start of Care: 8  Current functional level related to goals / functional outcomes:   Unable to formally assess as pt cancelled remaining PT visits, but as above visit pt was pain free and preparing to start a new job.   Remaining deficits:   Unable to assess.    Education / Equipment:   HEP  Plan: Patient agrees to discharge.  Patient goals were partially met. Patient is being discharged due to not returning since the last visit.  ?????     Percival Spanish, PT, MPT 10/31/16, 8:24 AM  [facility] [address]  [place], [place], [address] Phone: [phone]   Fax:  [phone]

## 2016-10-03 ENCOUNTER — Ambulatory Visit: Payer: BLUE CROSS/BLUE SHIELD

## 2016-10-05 ENCOUNTER — Ambulatory Visit: Payer: BLUE CROSS/BLUE SHIELD | Admitting: Physical Therapy

## 2016-10-12 ENCOUNTER — Ambulatory Visit (INDEPENDENT_AMBULATORY_CARE_PROVIDER_SITE_OTHER): Payer: BLUE CROSS/BLUE SHIELD | Admitting: Physician Assistant

## 2016-10-12 VITALS — BP 124/83 | HR 78 | Temp 98.2°F | Ht 63.0 in | Wt 258.0 lb

## 2016-10-12 DIAGNOSIS — Z6841 Body Mass Index (BMI) 40.0 and over, adult: Secondary | ICD-10-CM | POA: Diagnosis not present

## 2016-10-12 DIAGNOSIS — E669 Obesity, unspecified: Secondary | ICD-10-CM | POA: Diagnosis not present

## 2016-10-12 DIAGNOSIS — E559 Vitamin D deficiency, unspecified: Secondary | ICD-10-CM | POA: Diagnosis not present

## 2016-10-12 DIAGNOSIS — IMO0001 Reserved for inherently not codable concepts without codable children: Secondary | ICD-10-CM

## 2016-10-13 NOTE — Progress Notes (Signed)
Office: [phone]  /  Fax: [phone]   HPI:   Chief Complaint: OBESITY Sharon Rivers is here to discuss her progress with her obesity treatment plan. She is on the Category 3 plan and is following her eating plan approximately 85 % of the time. She states she is exercising walking 30 minutes 3 times per week. Sharon Rivers continues to do well with weight loss and states hunger is well controlled. Would like more snack and meal ideas. Her weight is 258 lb (117 kg) today and has had a weight loss of 3 pounds over a period of 2 weeks since her last visit. She has lost 22 lbs since starting treatment with [name].  Vitamin D deficiency Sharon Rivers has a diagnosis of vitamin D deficiency. She is currently taking prescription Vit D and denies nausea, vomiting or muscle weakness.  ALLERGIES: Allergies  Allergen Reactions  . Adhesive [Tape] Other (See Comments)    Skin Burn.  Sharon Rivers [Menthol (Topical Analgesic)] Other (See Comments)    Skin Burn.  . Sulfa Antibiotics Rash    MEDICATIONS: Current Outpatient Prescriptions on File Prior to Visit  Medication Sig Dispense Refill  . fluticasone (FLONASE) 50 MCG/ACT nasal spray Place 2 sprays into both nostrils daily. 16 g 0  . gabapentin (NEURONTIN) 300 MG capsule Take 300 mg by mouth 3 (three) times daily.    Sharon Rivers levothyroxine (SYNTHROID, LEVOTHROID) 175 MCG tablet TAKE 1 TABLET(175 MCG) BY MOUTH DAILY BEFORE BREAKFAST 30 tablet 5  . lisinopril (PRINIVIL,ZESTRIL) 20 MG tablet Take 1 tablet (20 mg total) by mouth daily. 30 tablet 2  . loratadine (CLARITIN) 10 MG tablet Take 1 tablet (10 mg total) by mouth daily. 30 tablet 11  . meloxicam (MOBIC) 7.5 MG tablet Take 1 tablet (7.5 mg total) by mouth daily. 14 tablet 0  . metFORMIN (GLUCOPHAGE) 500 MG tablet Take 1 tablet (500 mg total) by mouth 2 (two) times daily with a meal. 60 tablet 0  . NORLYDA 0.35 MG tablet TAKE 1 TABLET(0.35 MG) BY MOUTH DAILY 28 tablet 11  . ondansetron (ZOFRAN) 4 MG tablet Take 1  tablet (4 mg total) by mouth every 8 (eight) hours as needed for nausea or vomiting. 20 tablet 0  . pantoprazole (PROTONIX) 40 MG tablet TAKE 1 TABLET(40 MG) BY MOUTH DAILY 30 tablet 5  . SUMAtriptan (IMITREX) 100 MG tablet Take 1 tablet earliest onset of migraine.  May repeat once in 2 hours if headache persists or recurs.  Do not exceed more than 2 tablets in 24 hours 10 tablet 2  . topiramate (TOPAMAX) 100 MG tablet Take 1 tablet (100 mg total) by mouth at bedtime. 30 tablet 0  . venlafaxine XR (EFFEXOR-XR) 37.5 MG 24 hr capsule Take 2 capsules (75 mg total) by mouth daily. 60 capsule 1  . Vitamin D, Ergocalciferol, (DRISDOL) 50000 units CAPS capsule Take 1 capsule (50,000 Units total) by mouth every 7 (seven) days. 4 capsule 0   No current facility-administered medications on file prior to visit.     PAST MEDICAL HISTORY: Past Medical History:  Diagnosis Date  . Anxiety   . Back pain   . Chest pain    a. 07/2015 Myoview: EF 71%, medium size, mild intensity, partially reversible septal defect with overlying breast attenuation -> likely artifact. No significant reversible ischemia.  . Constipation   . Depression   . Edema    feet and legs  . Frequent headaches   . GERD (gastroesophageal reflux disease)   . Hypertension   .  Hypothyroidism   . Joint pain   . Migraines   . OSA (obstructive sleep apnea) 03/17/2016  . Osteoarthritis   . Panic anxiety syndrome   . SOB (shortness of breath)    a. 04/2016 Echo: EF 60-65%, Gr1 DD.  Sharon Rivers Swallowing difficulty     PAST SURGICAL HISTORY: Past Surgical History:  Procedure Laterality Date  . CESAREAN SECTION  2001 & 2002  . ELBOW SURGERY    . MINOR CARPAL TUNNEL     pinched nerve in elbow  . WISDOM TOOTH EXTRACTION      SOCIAL HISTORY: Social History  Substance Use Topics  . Smoking status: Former Research scientist (life sciences)  . Smokeless tobacco: Never Used     Comment: quit 1992  . Alcohol use No    FAMILY HISTORY: Family History  Problem Relation  Age of Onset  . Hypertension Mother        Living  . Arthritis Mother   . Thyroid disease Mother   . Heart disease Mother        MI at age 41  . Heart attack Mother   . Migraines Mother   . Depression Mother   . Obesity Mother   . Hypertension Maternal Grandmother   . Parkinson's disease Maternal Grandmother   . Alzheimer's disease Maternal Grandmother   . Cancer Maternal Grandfather   . Hypertension Maternal Grandfather   . Hypertension Paternal Grandmother   . Hypertension Paternal Grandfather   . Gout Brother   . ADD / ADHD Son        x1  . Other Son        #2-Unknown  . Diabetes Neg Hx     ROS: Review of Systems  Constitutional: Positive for weight loss.  Gastrointestinal: Negative for nausea and vomiting.  Musculoskeletal:       Negative muscle weakness    PHYSICAL EXAM: Blood pressure 124/83, pulse 78, temperature 98.2 F (36.8 C), temperature source Oral, height 5\' 3"  (1.6 m), weight 258 lb (117 kg), last menstrual period 10/03/2016, SpO2 100 %. Body mass index is 45.7 kg/m. Physical Exam  Constitutional: She is oriented to person, place, and time. She appears well-developed and well-nourished.  Cardiovascular: Normal rate.   Pulmonary/Chest: Effort normal.  Musculoskeletal: Normal range of motion.  Neurological: She is oriented to person, place, and time.  Skin: Skin is warm and dry.  Psychiatric: She has a normal mood and affect. Her behavior is normal.  Vitals reviewed.   RECENT LABS AND TESTS: BMET    Component Value Date/Time   NA 140 06/27/2016 1629   NA 136 03/10/2016 1051   K 3.5 06/27/2016 1629   CL 108 06/27/2016 1629   CO2 25 06/27/2016 1629   GLUCOSE 77 06/27/2016 1629   BUN 11 06/27/2016 1629   BUN 14 03/10/2016 1051   CREATININE 0.77 06/27/2016 1629   CALCIUM 9.3 06/27/2016 1629   GFRNONAA >60 03/22/2016 1828   GFRAA >60 03/22/2016 1828   Lab Results  Component Value Date   HGBA1C 4.8 03/10/2016   HGBA1C 4.7 07/22/2015   Lab  Results  Component Value Date   INSULIN 37.1 (H) 03/10/2016   CBC    Component Value Date/Time   WBC 7.1 06/27/2016 1629   RBC 4.27 06/27/2016 1629   HGB 12.4 06/27/2016 1629   HCT 36.4 06/27/2016 1629   PLT 356.0 06/27/2016 1629   MCV 85.1 06/27/2016 1629   MCH 28.9 03/22/2016 1828   MCHC 34.2 06/27/2016 1629  RDW 14.0 06/27/2016 1629   LYMPHSABS 1.7 06/27/2016 1629   MONOABS 0.6 06/27/2016 1629   EOSABS 0.2 06/27/2016 1629   BASOSABS 0.1 06/27/2016 1629   Iron/TIBC/Ferritin/ %Sat No results found for: IRON, TIBC, FERRITIN, IRONPCTSAT Lipid Panel     Component Value Date/Time   CHOL 123 03/10/2016 1051   TRIG 87 03/10/2016 1051   HDL 38 (L) 03/10/2016 1051   CHOLHDL 3 07/22/2015 0847   VLDL 11.2 07/22/2015 0847   LDLCALC 68 03/10/2016 1051   Hepatic Function Panel     Component Value Date/Time   PROT 7.1 06/27/2016 1629   PROT 7.2 03/10/2016 1051   ALBUMIN 4.1 06/27/2016 1629   ALBUMIN 4.2 03/10/2016 1051   AST 11 06/27/2016 1629   ALT 15 06/27/2016 1629   ALKPHOS 49 06/27/2016 1629   BILITOT 0.3 06/27/2016 1629   BILITOT 0.4 03/10/2016 1051      Component Value Date/Time   TSH 1.98 04/21/2016 0709   TSH 0.428 (L) 03/10/2016 1051   TSH 5.19 (H) 02/23/2016 1235    ASSESSMENT AND PLAN: Vitamin D deficiency  Class 3 obesity with serious comorbidity and body mass index (BMI) of 45.0 to 49.9 in adult, unspecified obesity type ([place])  PLAN:  Vitamin D Deficiency Taunya was informed that low vitamin D levels contributes to fatigue and are associated with obesity, breast, and colon cancer. She agrees to continue taking prescription Vit D @50 ,000 IU every week and will follow up for routine testing of vitamin D, at least 2-3 times per year. She was informed of the risk of over-replacement of vitamin D and agrees to not increase her dose unless he discusses this with [name] first. Anola agrees to follow up with our clinic in 2 weeks.  We spent > than 50% of the 15  minute visit on the counseling as documented in the note.  Obesity Timeka is currently in the action stage of change. As such, her goal is to continue with weight loss efforts She has agreed to follow the Category 3 plan Renatha has been instructed to work up to a goal of 150 minutes of combined cardio and strengthening exercise per week for weight loss and overall health benefits. We discussed the following Behavioral Modification Strategies today: increasing lean protein intake and work on meal planning and easy cooking plans   Jamelle has agreed to follow up with our clinic in 2 weeks. She was informed of the importance of frequent follow up visits to maximize her success with intensive lifestyle modifications for her multiple health conditions.  I, Trixie Dredge, am acting as transcriptionist for Lacy Duverney, PA-C  I have reviewed the above documentation for accuracy and completeness, and I agree with the above. -Lacy Duverney, PA-C  I have reviewed the above note and agree with the plan. -Dennard Nip, MD   OBESITY BEHAVIORAL INTERVENTION VISIT  Today's visit was # 14 out of 22.  Starting weight: 280 lbs Starting date: 03/10/16 Today's weight: 258 lbs Today's date: 10/12/16 Total lbs lost to date: 22 (Patients must lose 7 lbs in the first 6 months to continue with counseling)   ASK: We discussed the diagnosis of obesity with [name] today and Jamela agreed to give [name] permission to discuss obesity behavioral modification therapy today.  ASSESS: Miyu has the diagnosis of obesity and her BMI today is Allen is in the action stage of change   ADVISE: Ronnie was educated on the multiple health risks of obesity as well  as the benefit of weight loss to improve her health. She was advised of the need for long term treatment and the importance of lifestyle modifications.  AGREE: Multiple dietary modification options and treatment options were discussed and  Nakiah agreed to  follow the Category 3 plan We discussed the following Behavioral Modification Strategies today: increasing lean protein intake and work on meal planning and easy cooking plans

## 2016-10-17 ENCOUNTER — Other Ambulatory Visit: Payer: Self-pay | Admitting: Nurse Practitioner

## 2016-10-31 ENCOUNTER — Ambulatory Visit (INDEPENDENT_AMBULATORY_CARE_PROVIDER_SITE_OTHER): Payer: BLUE CROSS/BLUE SHIELD | Admitting: Physician Assistant

## 2016-10-31 VITALS — BP 122/81 | HR 67 | Temp 97.8°F | Ht 63.0 in | Wt 258.0 lb

## 2016-10-31 DIAGNOSIS — E8881 Metabolic syndrome: Secondary | ICD-10-CM | POA: Diagnosis not present

## 2016-10-31 DIAGNOSIS — F3289 Other specified depressive episodes: Secondary | ICD-10-CM

## 2016-10-31 DIAGNOSIS — E669 Obesity, unspecified: Secondary | ICD-10-CM

## 2016-10-31 DIAGNOSIS — E559 Vitamin D deficiency, unspecified: Secondary | ICD-10-CM

## 2016-10-31 DIAGNOSIS — IMO0001 Reserved for inherently not codable concepts without codable children: Secondary | ICD-10-CM

## 2016-10-31 DIAGNOSIS — E038 Other specified hypothyroidism: Secondary | ICD-10-CM | POA: Diagnosis not present

## 2016-10-31 DIAGNOSIS — Z6841 Body Mass Index (BMI) 40.0 and over, adult: Secondary | ICD-10-CM

## 2016-10-31 MED ORDER — VITAMIN D (ERGOCALCIFEROL) 1.25 MG (50000 UNIT) PO CAPS: 50000.0000 [IU] | ORAL_CAPSULE | ORAL | 0 refills | Status: DC

## 2016-10-31 MED ORDER — TOPIRAMATE 100 MG PO TABS: 100.0000 mg | ORAL_TABLET | Freq: Every day | ORAL | 0 refills | Status: DC

## 2016-10-31 MED ORDER — METFORMIN HCL 500 MG PO TABS: 500.0000 mg | ORAL_TABLET | Freq: Two times a day (BID) | ORAL | 0 refills | Status: DC

## 2016-10-31 NOTE — Progress Notes (Signed)
Office: [phone]  /  Fax: [phone]   HPI:   Chief Complaint: OBESITY Sharon Rivers is here to discuss her progress with her obesity treatment plan. She is on the Category 3 plan and is following her eating plan approximately 85 % of the time. She states she is walking for 30 minutes 3 times per week. Sharon Rivers maintained her weight. She has been planning her meals well and states hunger is well controlled. She would like to follow a structured plan rather than keeping a food journal for now. Her weight is 258 lb (117 kg) today and has maintained weight over a period of 3 weeks since her last visit. She has lost 30 lbs since starting treatment with [name].  Vitamin D deficiency Sharon Rivers has a diagnosis of vitamin D deficiency. She is currently taking vit D and denies nausea, vomiting or muscle weakness.  Insulin Resistance Sharon Rivers has a diagnosis of insulin resistance based on her elevated fasting insulin level >5. Although [name] blood glucose readings are still under good control, insulin resistance puts her at greater risk of metabolic syndrome and diabetes. She is taking metformin currently and continues to work on diet and exercise to decrease risk of diabetes.  Depression with emotional eating behaviors Sharon Rivers is struggling with emotional eating and using food for comfort to the extent that it is negatively impacting her health. She often snacks when she is not hungry. Sharon Rivers sometimes feels she is out of control and then feels guilty that she made poor food choices. She has been working on behavior modification techniques to help reduce her emotional eating and has been somewhat successful. Her mood is stable and she shows no sign of suicidal or homicidal ideations.  Hypothyroid Sharon Rivers has a diagnosis of hypothyroidism. She is on levothyroxine. She denies hot or cold intolerance or palpitations, but does admit to ongoing fatigue.  Depression screen [name] 2/9 03/10/2016 11/12/2015  Decreased  Interest 3 0  Down, Depressed, Hopeless 3 1  PHQ - 2 Score 6 1  Altered sleeping 3 -  Tired, decreased energy 3 -  Change in appetite 3 -  Feeling bad or failure about yourself  3 -  Trouble concentrating 3 -  Moving slowly or fidgety/restless 2 -  PHQ-9 Score 23 -      ALLERGIES: Allergies  Allergen Reactions  . Adhesive [Tape] Other (See Comments)    Skin Burn.  Sharon Rivers [Menthol (Topical Analgesic)] Other (See Comments)    Skin Burn.  . Sulfa Antibiotics Rash    MEDICATIONS: Current Outpatient Prescriptions on File Prior to Visit  Medication Sig Dispense Refill  . amLODipine (NORVASC) 10 MG tablet TAKE 1 TABLET(10 MG) BY MOUTH DAILY 30 tablet 4  . fluticasone (FLONASE) 50 MCG/ACT nasal spray Place 2 sprays into both nostrils daily. 16 g 0  . gabapentin (NEURONTIN) 300 MG capsule Take 300 mg by mouth 3 (three) times daily.    Sharon Rivers levothyroxine (SYNTHROID, LEVOTHROID) 175 MCG tablet TAKE 1 TABLET(175 MCG) BY MOUTH DAILY BEFORE BREAKFAST 30 tablet 5  . lisinopril (PRINIVIL,ZESTRIL) 20 MG tablet Take 1 tablet (20 mg total) by mouth daily. 30 tablet 2  . loratadine (CLARITIN) 10 MG tablet Take 1 tablet (10 mg total) by mouth daily. 30 tablet 11  . meloxicam (MOBIC) 7.5 MG tablet Take 1 tablet (7.5 mg total) by mouth daily. 14 tablet 0  . NORLYDA 0.35 MG tablet TAKE 1 TABLET(0.35 MG) BY MOUTH DAILY 28 tablet 11  . ondansetron (ZOFRAN) 4 MG tablet  Take 1 tablet (4 mg total) by mouth every 8 (eight) hours as needed for nausea or vomiting. 20 tablet 0  . pantoprazole (PROTONIX) 40 MG tablet TAKE 1 TABLET(40 MG) BY MOUTH DAILY 30 tablet 5  . SUMAtriptan (IMITREX) 100 MG tablet Take 1 tablet earliest onset of migraine.  May repeat once in 2 hours if headache persists or recurs.  Do not exceed more than 2 tablets in 24 hours 10 tablet 2  . venlafaxine XR (EFFEXOR-XR) 37.5 MG 24 hr capsule Take 2 capsules (75 mg total) by mouth daily. 60 capsule 1   No current facility-administered  medications on file prior to visit.     PAST MEDICAL HISTORY: Past Medical History:  Diagnosis Date  . Anxiety   . Back pain   . Chest pain    a. 07/2015 Myoview: EF 71%, medium size, mild intensity, partially reversible septal defect with overlying breast attenuation -> likely artifact. No significant reversible ischemia.  . Constipation   . Depression   . Edema    feet and legs  . Frequent headaches   . GERD (gastroesophageal reflux disease)   . Hypertension   . Hypothyroidism   . Joint pain   . Migraines   . OSA (obstructive sleep apnea) 03/17/2016  . Osteoarthritis   . Panic anxiety syndrome   . SOB (shortness of breath)    a. 04/2016 Echo: EF 60-65%, Gr1 DD.  Sharon Rivers Swallowing difficulty     PAST SURGICAL HISTORY: Past Surgical History:  Procedure Laterality Date  . CESAREAN SECTION  2001 & 2002  . ELBOW SURGERY    . MINOR CARPAL TUNNEL     pinched nerve in elbow  . WISDOM TOOTH EXTRACTION      SOCIAL HISTORY: Social History  Substance Use Topics  . Smoking status: Former Research scientist (life sciences)  . Smokeless tobacco: Never Used     Comment: quit 1992  . Alcohol use No    FAMILY HISTORY: Family History  Problem Relation Age of Onset  . Hypertension Mother        Living  . Arthritis Mother   . Thyroid disease Mother   . Heart disease Mother        MI at age 35  . Heart attack Mother   . Migraines Mother   . Depression Mother   . Obesity Mother   . Hypertension Maternal Grandmother   . Parkinson's disease Maternal Grandmother   . Alzheimer's disease Maternal Grandmother   . Cancer Maternal Grandfather   . Hypertension Maternal Grandfather   . Hypertension Paternal Grandmother   . Hypertension Paternal Grandfather   . Gout Brother   . ADD / ADHD Son        x1  . Other Son        #2-Unknown  . Diabetes Neg Hx     ROS: Review of Systems  Constitutional: Positive for malaise/fatigue. Negative for weight loss.  Cardiovascular: Negative for palpitations.    Gastrointestinal: Negative for nausea and vomiting.  Musculoskeletal:       Negative muscle weakness  Endo/Heme/Allergies:       Negative Heat/Cold Intolerance  Psychiatric/Behavioral: Positive for depression. Negative for suicidal ideas.    PHYSICAL EXAM: Blood pressure 122/81, pulse 67, temperature 97.8 F (36.6 C), height 5\' 3"  (1.6 m), weight 258 lb (117 kg), last menstrual period 10/03/2016, SpO2 100 %. Body mass index is 45.7 kg/m. Physical Exam  Constitutional: She is oriented to person, place, and time. She appears well-developed and  well-nourished.  Cardiovascular: Normal rate.   Pulmonary/Chest: Effort normal.  Musculoskeletal: Normal range of motion.  Neurological: She is oriented to person, place, and time.  Skin: Skin is warm and dry.  Psychiatric: She has a normal mood and affect. Her behavior is normal.  Vitals reviewed.   RECENT LABS AND TESTS: BM ET    Component Value Date/Time   NA 140 06/27/2016 1629   NA 136 03/10/2016 1051   K 3.5 06/27/2016 1629   CL 108 06/27/2016 1629   CO2 25 06/27/2016 1629   GLUCOSE 77 06/27/2016 1629   BUN 11 06/27/2016 1629   BUN 14 03/10/2016 1051   CREATININE 0.77 06/27/2016 1629   CALCIUM 9.3 06/27/2016 1629   GFRNONAA >60 03/22/2016 1828   GFRAA >60 03/22/2016 1828   Lab Results  Component Value Date   HGBA1C 4.8 03/10/2016   HGBA1C 4.7 07/22/2015   Lab Results  Component Value Date   INSULIN 37.1 (H) 03/10/2016   CBC    Component Value Date/Time   WBC 7.1 06/27/2016 1629   RBC 4.27 06/27/2016 1629   HGB 12.4 06/27/2016 1629   HCT 36.4 06/27/2016 1629   PLT 356.0 06/27/2016 1629   MCV 85.1 06/27/2016 1629   MCH 28.9 03/22/2016 1828   MCHC 34.2 06/27/2016 1629   RDW 14.0 06/27/2016 1629   LYMPHSABS 1.7 06/27/2016 1629   MONOABS 0.6 06/27/2016 1629   EOSABS 0.2 06/27/2016 1629   BASOSABS 0.1 06/27/2016 1629   Iron/TIBC/Ferritin/ %Sat No results found for: IRON, TIBC, FERRITIN, IRONPCTSAT Lipid Panel      Component Value Date/Time   CHOL 123 03/10/2016 1051   TRIG 87 03/10/2016 1051   HDL 38 (L) 03/10/2016 1051   CHOLHDL 3 07/22/2015 0847   VLDL 11.2 07/22/2015 0847   LDLCALC 68 03/10/2016 1051   Hepatic Function Panel     Component Value Date/Time   PROT 7.1 06/27/2016 1629   PROT 7.2 03/10/2016 1051   ALBUMIN 4.1 06/27/2016 1629   ALBUMIN 4.2 03/10/2016 1051   AST 11 06/27/2016 1629   ALT 15 06/27/2016 1629   ALKPHOS 49 06/27/2016 1629   BILITOT 0.3 06/27/2016 1629   BILITOT 0.4 03/10/2016 1051      Component Value Date/Time   TSH 1.98 04/21/2016 0709   TSH 0.428 (L) 03/10/2016 1051   TSH 5.19 (H) 02/23/2016 1235    ASSESSMENT AND PLAN: Insulin resistance - Plan: Comprehensive metabolic panel, Hemoglobin A1c, Insulin, random, metFORMIN (GLUCOPHAGE) 500 MG tablet  Vitamin D deficiency - Plan: VITAMIN D 25 Hydroxy (Vit-D Deficiency, Fractures), Vitamin D, Ergocalciferol, (DRISDOL) 50000 units CAPS capsule  Other specified hypothyroidism - Plan: TSH, T4, free, T3  Other depression - with emotional eating - Plan: topiramate (TOPAMAX) 100 MG tablet  Class 3 obesity with serious comorbidity and body mass index (BMI) of 45.0 to 49.9 in adult, unspecified obesity type (HCC)  PLAN:  Vitamin D Deficiency Kathee was informed that low vitamin D levels contributes to fatigue and are associated with obesity, breast, and colon cancer. She agrees to continue to take prescription Vit D @50 ,000 I every week, we will refill for 1 month and will check labs and will follow up for routine testing of vitamin D, at least 2-3 times per year. She was informed of the risk of over-replacement of vitamin D and agrees to not increase her dose unless he discusses this with [name] first. Marlita agrees to follow up with our clinic in 3 weeks.  Insulin Resistance  Micayla will continue to work on weight loss, exercise, and decreasing simple carbohydrates in her diet to help decrease the risk of diabetes.  We dicussed metformin including benefits and risks. She was informed that eating too many simple carbohydrates or too many calories at one sitting increases the likelihood of GI side effects. Edyth agrees to continue metformin 500 mg bid #60 with no refills for now and will follow up as directed to monitor her progress.  Depression with Emotional Eating Behaviors We discussed behavior modification techniques today to help Demia deal with her emotional eating and depression. She has agreed to continue Totipalmate 100 mg qd #30 with no refills and will follow up as directed.  Hypothyroid Retta was informed of the importance of good thyroid control to help with weight loss efforts. She was also informed that superhero thyroid levels are dangerous and will not improve weight loss results. We will check labs and Trachelle agrees to follow up with our clinic in 3 weeks.  Obesity Gabrielly is currently in the action stage of change. As such, her goal is to continue with weight loss efforts She has agreed to follow the Category 3 plan Juley has been instructed to work up to a goal of 150 minutes of combined cardio and strengthening exercise per week for weight loss and overall health benefits. We discussed the following Behavioral Modification Strategies today: increasing lean protein intake and work on meal planning and easy cooking plans  Janaya has agreed to follow up with our clinic in 3 weeks. She was informed of the importance of frequent follow up visits to maximize her success with intensive lifestyle modifications for her multiple health conditions.  I, Doreene Nest, am acting as transcriptionist for [employer] man, PA-C  I have reviewed the above documentation for accuracy and completeness, and I agree with the above. Dorris Fetch Os man, PA-C  I have reviewed the above note and agree with the plan. Dorthy Cooler, MD  OBESITY BEHAVIORAL INTERVENTION VISIT  Today's visit was # 15 out of  59.  Starting weight: 280 lbs Starting date: 03/10/16 Today's weight : 258 lbs Today's date: 10/31/2016 Total lbs lost to date: 30 (Patients must lose 7 lbs in the first 6 months to continue with counseling)   ASK: We discussed the diagnosis of obesity with [name] today and Armando agreed to give [name] permission to discuss obesity behavioral modification therapy today.  ASSESS: Ahaana has the diagnosis of obesity and her BMI today is 45.71 Anyah is in the action stage of change   ADVISE: Cherrelle was educated on the multiple health risks of obesity as well as the benefit of weight loss to improve her health. She was advised of the need for long term treatment and the importance of lifestyle modifications.  AGREE: Multiple dietary modification options and treatment options were discussed and  Abilene agreed to follow the Category 3 plan We discussed the following Behavioral Modification Strategies today: increasing lean protein intake and work on meal planning and easy cooking plans

## 2016-11-01 ENCOUNTER — Ambulatory Visit: Payer: Self-pay | Admitting: Family

## 2016-11-01 LAB — COMPREHENSIVE METABOLIC PANEL
A/G RATIO: 1.5 (ref 1.2–2.2)
ALT: 9 IU/L (ref 0–32)
AST: 11 IU/L (ref 0–40)
Albumin: 4.1 g/dL (ref 3.5–5.5)
Alkaline Phosphatase: 65 IU/L (ref 39–117)
BUN/Creatinine Ratio: 19 (ref 9–23)
BUN: 13 mg/dL (ref 6–24)
Bilirubin Total: 0.3 mg/dL (ref 0.0–1.2)
CALCIUM: 8.7 mg/dL (ref 8.7–10.2)
CO2: 19 mmol/L — ABNORMAL LOW (ref 20–29)
Chloride: 109 mmol/L — ABNORMAL HIGH (ref 96–106)
Creatinine, Ser: 0.67 mg/dL (ref 0.57–1.00)
GFR, EST AFRICAN AMERICAN: 124 mL/min/{1.73_m2} (ref >59)
GFR, EST NON AFRICAN AMERICAN: 107 mL/min/{1.73_m2} (ref >59)
GLUCOSE: 86 mg/dL (ref 65–99)
Globulin, Total: 2.7 g/dL (ref 1.5–4.5)
Potassium: 3.9 mmol/L (ref 3.5–5.2)
Sodium: 139 mmol/L (ref 134–144)
TOTAL PROTEIN: 6.8 g/dL (ref 6.0–8.5)

## 2016-11-01 LAB — VITAMIN D 25 HYDROXY (VIT D DEFICIENCY, FRACTURES): VIT D 25 HYDROXY: 33.7 ng/mL (ref 30.0–100.0)

## 2016-11-01 LAB — HEMOGLOBIN A1C
Est. average glucose Bld gHb Est-mCnc: 85 mg/dL
Hgb A1c MFr Bld: 4.6 % — ABNORMAL LOW (ref 4.8–5.6)

## 2016-11-01 LAB — INSULIN, RANDOM: INSULIN: 14.3 u[IU]/mL (ref 2.6–24.9)

## 2016-11-01 LAB — T4, FREE: Free T4: 1.74 ng/dL (ref 0.82–1.77)

## 2016-11-01 LAB — T3: T3 TOTAL: 92 ng/dL (ref 71–180)

## 2016-11-01 LAB — TSH: TSH: 1.04 u[IU]/mL (ref 0.450–4.500)

## 2016-11-15 ENCOUNTER — Ambulatory Visit (INDEPENDENT_AMBULATORY_CARE_PROVIDER_SITE_OTHER): Payer: BLUE CROSS/BLUE SHIELD | Admitting: Family Medicine

## 2016-11-16 ENCOUNTER — Ambulatory Visit (INDEPENDENT_AMBULATORY_CARE_PROVIDER_SITE_OTHER): Payer: BLUE CROSS/BLUE SHIELD | Admitting: Family

## 2016-11-16 ENCOUNTER — Encounter: Payer: Self-pay | Admitting: Family

## 2016-11-16 ENCOUNTER — Other Ambulatory Visit: Payer: Self-pay | Admitting: Family

## 2016-11-16 VITALS — BP 118/82 | HR 81 | Temp 98.8°F | Resp 16 | Ht 63.0 in | Wt 263.8 lb

## 2016-11-16 DIAGNOSIS — I10 Essential (primary) hypertension: Secondary | ICD-10-CM | POA: Diagnosis not present

## 2016-11-16 DIAGNOSIS — M79605 Pain in left leg: Secondary | ICD-10-CM

## 2016-11-16 DIAGNOSIS — M545 Low back pain: Secondary | ICD-10-CM

## 2016-11-16 DIAGNOSIS — R3915 Urgency of urination: Secondary | ICD-10-CM

## 2016-11-16 LAB — POC URINALSYSI DIPSTICK (AUTOMATED)
BILIRUBIN UA: NEGATIVE
Blood, UA: NEGATIVE
GLUCOSE UA: NEGATIVE
Leukocytes, UA: NEGATIVE
Nitrite, UA: NEGATIVE
Protein, UA: NEGATIVE
SPEC GRAV UA: 1.02 (ref 1.010–1.025)
Urobilinogen, UA: 0.2 E.U./dL
pH, UA: 6 (ref 5.0–8.0)

## 2016-11-16 MED ORDER — METHYLPREDNISOLONE 4 MG PO TBPK: ORAL_TABLET | ORAL | 0 refills | Status: DC

## 2016-11-16 NOTE — Patient Instructions (Addendum)
You will be contacted about your referral to neurosurgery for your back pain.  Start medrol dose pak for your back.

## 2016-11-16 NOTE — Progress Notes (Signed)
Subjective:    Patient ID: Karlene Southard, female    DOB: 09-Jul-1970, 46 y.o.   MRN: [id number]  HPI  Patient is a 46 yr old female who presents today with chief complaint of back pain. Reports that she started a new job on Monday 10/8.  Reports that she developed back spasm yesterday. Had radiculopathy down the left leg. Then felt light headed and dizzy.  BP was noted to be elevated.  She is working at [employer], at [employer]. She stands at her job.    She reports + urinary urgency- reports that this started Monday. Notes that if she doesn't hurry, she might "have an accident."  Sometimes feels like she needs to go but only "has a drop or two."  Denies dysuria, denies fecal incontinence.    HTN- reports that she had an elevated blood pressure at work.     Review of Systems See HPI  Past Medical History:  Diagnosis Date  . Anxiety   . Back pain   . Chest pain    a. 07/2015 Myoview: EF 71%, medium size, mild intensity, partially reversible septal defect with overlying breast attenuation -> likely artifact. No significant reversible ischemia.  . Constipation   . Depression   . Edema    feet and legs  . Frequent headaches   . GERD (gastroesophageal reflux disease)   . Hypertension   . Hypothyroidism   . Joint pain   . Migraines   . OSA (obstructive sleep apnea) 03/17/2016  . Osteoarthritis   . Panic anxiety syndrome   . SOB (shortness of breath)    a. 04/2016 Echo: EF 60-65%, Gr1 DD.  [name] Swallowing difficulty      Social History   Social History  . Marital status: Single    Spouse name: N/A  . Number of children: N/A  . Years of education: N/A   Occupational History  . Assembler     [employer]   Social History Main Topics  . Smoking status: Former Research scientist (life sciences)  . Smokeless tobacco: Never Used     Comment: quit 1992  . Alcohol use No  . Drug use: No  . Sexual activity: Yes    Partners: Female    Birth control/ protection: Pill     Comment: OCP   Other Topics  Concern  . Not on file   Social History Narrative   She has 2 children (grown). She did not retain custody of these children.   Lives with boyfriend in [place].   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     Past Surgical History:  Procedure Laterality Date  . CESAREAN SECTION  2001 & 2002  . ELBOW SURGERY    . MINOR CARPAL TUNNEL     pinched nerve in elbow  . WISDOM TOOTH EXTRACTION      Family History  Problem Relation Age of Onset  . Hypertension Mother        Living  . Arthritis Mother   . Thyroid disease Mother   . Heart disease Mother        MI at age 65  . Heart attack Mother   . Migraines Mother   . Depression Mother   . Obesity Mother   . Hypertension Maternal Grandmother   . Parkinson's disease Maternal Grandmother   . Alzheimer's disease Maternal Grandmother   . Cancer Maternal Grandfather   . Hypertension Maternal Grandfather   . Hypertension Paternal Grandmother   . Hypertension Paternal Grandfather   .  Gout Brother   . ADD / ADHD Son        x1  . Other Son        #2-Unknown  . Diabetes Neg Hx     Allergies  Allergen Reactions  . Adhesive [Tape] Other (See Comments)    Skin Burn.  [name] [Menthol (Topical Analgesic)] Other (See Comments)    Skin Burn.  . Sulfa Antibiotics Rash    Current Outpatient Prescriptions on File Prior to Visit  Medication Sig Dispense Refill  . amLODipine (NORVASC) 10 MG tablet TAKE 1 TABLET(10 MG) BY MOUTH DAILY 30 tablet 4  . fluticasone (FLONASE) 50 MCG/ACT nasal spray Place 2 sprays into both nostrils daily. 16 g 0  . gabapentin (NEURONTIN) 300 MG capsule Take 300 mg by mouth 3 (three) times daily.    [name] levothyroxine (SYNTHROID, LEVOTHROID) 175 MCG tablet TAKE 1 TABLET(175 MCG) BY MOUTH DAILY BEFORE BREAKFAST 30 tablet 5  . lisinopril (PRINIVIL,ZESTRIL) 20 MG tablet Take 1 tablet (20 mg total) by mouth daily. 30 tablet 2  . loratadine (CLARITIN) 10 MG tablet Take 1 tablet (10 mg total) by mouth daily. 30  tablet 11  . meloxicam (MOBIC) 7.5 MG tablet Take 1 tablet (7.5 mg total) by mouth daily. 14 tablet 0  . metFORMIN (GLUCOPHAGE) 500 MG tablet Take 1 tablet (500 mg total) by mouth 2 (two) times daily with a meal. 60 tablet 0  . NORLYDA 0.35 MG tablet TAKE 1 TABLET(0.35 MG) BY MOUTH DAILY 28 tablet 11  . ondansetron (ZOFRAN) 4 MG tablet Take 1 tablet (4 mg total) by mouth every 8 (eight) hours as needed for nausea or vomiting. 20 tablet 0  . pantoprazole (PROTONIX) 40 MG tablet TAKE 1 TABLET(40 MG) BY MOUTH DAILY 30 tablet 5  . SUMAtriptan (IMITREX) 100 MG tablet Take 1 tablet earliest onset of migraine.  May repeat once in 2 hours if headache persists or recurs.  Do not exceed more than 2 tablets in 24 hours 10 tablet 2  . topiramate (TOPAMAX) 100 MG tablet Take 1 tablet (100 mg total) by mouth at bedtime. 30 tablet 0  . venlafaxine XR (EFFEXOR-XR) 37.5 MG 24 hr capsule Take 2 capsules (75 mg total) by mouth daily. 60 capsule 1  . Vitamin D, Ergocalciferol, (DRISDOL) 50000 units CAPS capsule Take 1 capsule (50,000 Units total) by mouth every 7 (seven) days. 4 capsule 0   No current facility-administered medications on file prior to visit.     BP 118/82 (BP Location: Right Arm) Comment (Cuff Size): thigh  Pulse 81   Temp 98.8 F (37.1 C) (Oral)   Resp 16   Ht 5\' 3"  (1.6 m)   Wt 263 lb 12.8 oz (119.7 kg)   SpO2 100%   BMI 46.73 kg/m       Objective:   Physical Exam  Constitutional: She is oriented to person, place, and time. She appears well-developed and well-nourished.  HENT:  Head: Normocephalic and atraumatic.  Cardiovascular: Normal rate, regular rhythm and normal heart sounds.   No murmur heard. Pulmonary/Chest: Effort normal and breath sounds normal. No respiratory distress. She has no wheezes.  Musculoskeletal:       Cervical back: She exhibits no tenderness.       Thoracic back: She exhibits no tenderness.       Lumbar back: She exhibits tenderness.  Neurological: She is  alert and oriented to person, place, and time.  Reflex Scores:      Patellar reflexes  are 2+ on the right side and 2+ on the left side. 4-5+ LLE weakness 5/5 RLE weakness  Psychiatric: She has a normal mood and affect. Her behavior is normal. Judgment and thought content normal.          Assessment & Plan:  Low back pain with radiculopathy- rx with medrol dose pak. Refer to neurosurgery. Completed PT in august. Had MRI in July which noted: Small left foraminal disc protrusion L1-2.  I have written her out of work until 11/21/16.  Small left paracentral disc protrusion L5-S1 without neural compression  Elevated blood pressure- follow up pressure here is OK, was likely elevated due to pain.  Urinary urgency- this is a chronic issue. UA shows ketones only. Will send for culture to exclude infection. Could be some OAB.  Consider trial of myrbetriq if symptoms do not improve.

## 2016-11-18 ENCOUNTER — Telehealth: Payer: Self-pay | Admitting: Family

## 2016-11-18 MED ORDER — NITROFURANTOIN MONOHYD MACRO 100 MG PO CAPS: 100.0000 mg | ORAL_CAPSULE | Freq: Two times a day (BID) | ORAL | 0 refills | Status: DC

## 2016-11-18 NOTE — Telephone Encounter (Signed)
Notified pt and she voices understanding. 

## 2016-11-18 NOTE — Telephone Encounter (Signed)
Urine growing ecoli. Rx sent or macrobid. Call if symptoms worsen or fail to improve.

## 2016-11-19 LAB — URINE CULTURE
MICRO NUMBER:: 81129375
SPECIMEN QUALITY: ADEQUATE

## 2016-11-20 ENCOUNTER — Other Ambulatory Visit: Payer: Self-pay | Admitting: Family

## 2016-11-21 ENCOUNTER — Ambulatory Visit (INDEPENDENT_AMBULATORY_CARE_PROVIDER_SITE_OTHER): Payer: BLUE CROSS/BLUE SHIELD | Admitting: Physician Assistant

## 2016-11-21 VITALS — BP 114/78 | HR 73 | Temp 98.3°F | Ht 63.0 in | Wt 256.0 lb

## 2016-11-21 DIAGNOSIS — Z6841 Body Mass Index (BMI) 40.0 and over, adult: Secondary | ICD-10-CM | POA: Diagnosis not present

## 2016-11-21 DIAGNOSIS — E66813 Obesity, class 3: Secondary | ICD-10-CM

## 2016-11-21 DIAGNOSIS — I10 Essential (primary) hypertension: Secondary | ICD-10-CM | POA: Diagnosis not present

## 2016-11-22 ENCOUNTER — Encounter: Payer: Self-pay | Admitting: Family

## 2016-11-22 NOTE — Progress Notes (Signed)
Office: [phone]  /  Fax: [phone]   HPI:   Chief Complaint: OBESITY Sharon Rivers is here to discuss her progress with her obesity treatment plan. She is on the Category 3 plan and is following her eating plan approximately 85 % of the time. She states she is exercising walking 30 minutes 5 times per week. Sharon Rivers continues to do well with weight loss. She states her hunger is well controlled. She would like more variety at dinner.  Her weight is 256 lb (116.1 kg) today and has had a weight loss of 2 pounds over a period of 3 weeks since her last visit. She has lost 2 lbs since starting treatment with [name].  Hypertension [name] is a 46 y.o. female with hypertension. [name] denies chest pain or shortness of breath. She is working weight loss to help control her blood pressure with the goal of decreasing her risk of heart attack and stroke. [name] blood pressure is stable.  ALLERGIES: Allergies  Allergen Reactions  . Adhesive [Tape] Other (See Comments)    Skin Burn.  [name] [Menthol (Topical Analgesic)] Other (See Comments)    Skin Burn.  . Sulfa Antibiotics Rash    MEDICATIONS: Current Outpatient Prescriptions on File Prior to Visit  Medication Sig Dispense Refill  . amLODipine (NORVASC) 10 MG tablet TAKE 1 TABLET(10 MG) BY MOUTH DAILY 30 tablet 4  . fluticasone (FLONASE) 50 MCG/ACT nasal spray Place 2 sprays into both nostrils daily. 16 g 0  . gabapentin (NEURONTIN) 300 MG capsule Take 300 mg by mouth 3 (three) times daily.    [name] levothyroxine (SYNTHROID, LEVOTHROID) 175 MCG tablet TAKE 1 TABLET(175 MCG) BY MOUTH DAILY BEFORE BREAKFAST 30 tablet 5  . lisinopril (PRINIVIL,ZESTRIL) 20 MG tablet TAKE 1 TABLET(20 MG) BY MOUTH DAILY 30 tablet 5  . loratadine (CLARITIN) 10 MG tablet Take 1 tablet (10 mg total) by mouth daily. 30 tablet 11  . meloxicam (MOBIC) 7.5 MG tablet Take 1 tablet (7.5 mg total) by mouth daily. 14 tablet 0  . metFORMIN (GLUCOPHAGE) 500 MG  tablet Take 1 tablet (500 mg total) by mouth 2 (two) times daily with a meal. 60 tablet 0  . methylPREDNISolone (MEDROL DOSEPAK) 4 MG TBPK tablet Take as directed 21 tablet 0  . nitrofurantoin, macrocrystal-monohydrate, (MACROBID) 100 MG capsule Take 1 capsule (100 mg total) by mouth 2 (two) times daily. 14 capsule 0  . NORLYDA 0.35 MG tablet TAKE 1 TABLET(0.35 MG) BY MOUTH DAILY 28 tablet 11  . ondansetron (ZOFRAN) 4 MG tablet Take 1 tablet (4 mg total) by mouth every 8 (eight) hours as needed for nausea or vomiting. 20 tablet 0  . pantoprazole (PROTONIX) 40 MG tablet TAKE 1 TABLET(40 MG) BY MOUTH DAILY 30 tablet 5  . SUMAtriptan (IMITREX) 100 MG tablet Take 1 tablet earliest onset of migraine.  May repeat once in 2 hours if headache persists or recurs.  Do not exceed more than 2 tablets in 24 hours 10 tablet 2  . topiramate (TOPAMAX) 100 MG tablet Take 1 tablet (100 mg total) by mouth at bedtime. 30 tablet 0  . venlafaxine XR (EFFEXOR-XR) 37.5 MG 24 hr capsule Take 2 capsules (75 mg total) by mouth daily. 60 capsule 1  . Vitamin D, Ergocalciferol, (DRISDOL) 50000 units CAPS capsule Take 1 capsule (50,000 Units total) by mouth every 7 (seven) days. 4 capsule 0   No current facility-administered medications on file prior to visit.     PAST MEDICAL HISTORY: Past  Medical History:  Diagnosis Date  . Anxiety   . Back pain   . Chest pain    a. 07/2015 Myoview: EF 71%, medium size, mild intensity, partially reversible septal defect with overlying breast attenuation -> likely artifact. No significant reversible ischemia.  . Constipation   . Depression   . Edema    feet and legs  . Frequent headaches   . GERD (gastroesophageal reflux disease)   . Hypertension   . Hypothyroidism   . Joint pain   . Migraines   . OSA (obstructive sleep apnea) 03/17/2016  . Osteoarthritis   . Panic anxiety syndrome   . SOB (shortness of breath)    a. 04/2016 Echo: EF 60-65%, Gr1 DD.  [name] Swallowing difficulty      PAST SURGICAL HISTORY: Past Surgical History:  Procedure Laterality Date  . CESAREAN SECTION  2001 & 2002  . ELBOW SURGERY    . MINOR CARPAL TUNNEL     pinched nerve in elbow  . WISDOM TOOTH EXTRACTION      SOCIAL HISTORY: Social History  Substance Use Topics  . Smoking status: Former Research scientist (life sciences)  . Smokeless tobacco: Never Used     Comment: quit 1992  . Alcohol use No    FAMILY HISTORY: Family History  Problem Relation Age of Onset  . Hypertension Mother        Living  . Arthritis Mother   . Thyroid disease Mother   . Heart disease Mother        MI at age 80  . Heart attack Mother   . Migraines Mother   . Depression Mother   . Obesity Mother   . Hypertension Maternal Grandmother   . Parkinson's disease Maternal Grandmother   . Alzheimer's disease Maternal Grandmother   . Cancer Maternal Grandfather   . Hypertension Maternal Grandfather   . Hypertension Paternal Grandmother   . Hypertension Paternal Grandfather   . Gout Brother   . ADD / ADHD Son        x1  . Other Son        #2-Unknown  . Diabetes Neg Hx     ROS: Review of Systems  Constitutional: Positive for weight loss.  Respiratory: Negative for shortness of breath.   Cardiovascular: Negative for chest pain.    PHYSICAL EXAM: Blood pressure 114/78, pulse 73, temperature 98.3 F (36.8 C), temperature source Oral, height 5\' 3"  (1.6 m), weight 256 lb (116.1 kg), last menstrual period 10/21/2016, SpO2 98 %. Body mass index is 45.35 kg/m. Physical Exam  Constitutional: She is oriented to person, place, and time. She appears well-developed and well-nourished.  Cardiovascular: Normal rate.   Pulmonary/Chest: Effort normal.  Musculoskeletal: Normal range of motion.  Neurological: She is oriented to person, place, and time.  Skin: Skin is warm and dry.  Psychiatric: She has a normal mood and affect. Her behavior is normal.  Vitals reviewed.   RECENT LABS AND TESTS: BMET    Component Value  Date/Time   NA 139 10/31/2016 0835   K 3.9 10/31/2016 0835   CL 109 (H) 10/31/2016 0835   CO2 19 (L) 10/31/2016 0835   GLUCOSE 86 10/31/2016 0835   GLUCOSE 77 06/27/2016 1629   BUN 13 10/31/2016 0835   CREATININE 0.67 10/31/2016 0835   CALCIUM 8.7 10/31/2016 0835   GFRNONAA 107 10/31/2016 0835   GFRAA 124 10/31/2016 0835   Lab Results  Component Value Date   HGBA1C 4.6 (L) 10/31/2016   HGBA1C 4.8 03/10/2016  HGBA1C 4.7 07/22/2015   Lab Results  Component Value Date   INSULIN 14.3 10/31/2016   INSULIN 37.1 (H) 03/10/2016   CBC    Component Value Date/Time   WBC 7.1 06/27/2016 1629   RBC 4.27 06/27/2016 1629   HGB 12.4 06/27/2016 1629   HCT 36.4 06/27/2016 1629   PLT 356.0 06/27/2016 1629   MCV 85.1 06/27/2016 1629   MCH 28.9 03/22/2016 1828   MCHC 34.2 06/27/2016 1629   RDW 14.0 06/27/2016 1629   LYMPHSABS 1.7 06/27/2016 1629   MONOABS 0.6 06/27/2016 1629   EOSABS 0.2 06/27/2016 1629   BASOSABS 0.1 06/27/2016 1629   Iron/TIBC/Ferritin/ %Sat No results found for: IRON, TIBC, FERRITIN, IRONPCTSAT Lipid Panel     Component Value Date/Time   CHOL 123 03/10/2016 1051   TRIG 87 03/10/2016 1051   HDL 38 (L) 03/10/2016 1051   CHOLHDL 3 07/22/2015 0847   VLDL 11.2 07/22/2015 0847   LDLCALC 68 03/10/2016 1051   Hepatic Function Panel     Component Value Date/Time   PROT 6.8 10/31/2016 0835   ALBUMIN 4.1 10/31/2016 0835   AST 11 10/31/2016 0835   ALT 9 10/31/2016 0835   ALKPHOS 65 10/31/2016 0835   BILITOT 0.3 10/31/2016 0835      Component Value Date/Time   TSH 1.040 10/31/2016 0835   TSH 1.98 04/21/2016 0709   TSH 0.428 (L) 03/10/2016 1051    ASSESSMENT AND PLAN: Essential hypertension  Class 3 severe obesity with serious comorbidity and body mass index (BMI) of 45.0 to 49.9 in adult, unspecified obesity type (HCC)  PLAN:  Hypertension We discussed sodium restriction, working on healthy weight loss, and a regular exercise program as the means to  achieve improved blood pressure control. Allaina agreed with this plan and agreed to follow up as directed. We will continue to monitor her blood pressure as well as her progress with the above lifestyle modifications. Jamyra agrees to continue her medications as prescribed and will watch for signs of hypotension as she continues her lifestyle modifications. She agrees to follow up with our clinic in 4 weeks.  We spent > than 50% of the 15 minute visit on the counseling as documented in the note.  Obesity Sabena is currently in the action stage of change. As such, her goal is to continue with weight loss efforts She has agreed to change to keep a food journal with 600 calories and 40 grams of protein at supper and follow the Category 3 plan Lamara has been instructed to work up to a goal of 150 minutes of combined cardio and strengthening exercise per week for weight loss and overall health benefits. We discussed the following Behavioral Modification Strategies today: increasing lean protein intake and work on meal planning and easy cooking plans   Sariyah has agreed to follow up with our clinic in 4 weeks. She was informed of the importance of frequent follow up visits to maximize her success with intensive lifestyle modifications for her multiple health conditions.  I, Trixie Dredge, am acting as transcriptionist for Lacy Duverney, PA-C  I have reviewed the above documentation for accuracy and completeness, and I agree with the above. -Lacy Duverney, PA-C  I have reviewed the above note and agree with the plan. -Dennard Nip, MD     Today's visit was # 16 out of 22.  Starting weight: 280 lbs Starting date: 03/10/16 Today's weight : 256 lbs  Today's date: 11/21/2016 Total lbs lost to date: 24 (Patients  must lose 7 lbs in the first 6 months to continue with counseling)   ASK: We discussed the diagnosis of obesity with [name] today and Rabiah agreed to give [name] permission to discuss  obesity behavioral modification therapy today.  ASSESS: Tahlor has the diagnosis of obesity and her BMI today is [place] is in the action stage of change   ADVISE: Georgina was educated on the multiple health risks of obesity as well as the benefit of weight loss to improve her health. She was advised of the need for long term treatment and the importance of lifestyle modifications.  AGREE: Multiple dietary modification options and treatment options were discussed and  Corleen agreed to keep a food journal with 600 calories and 40 grams of protein at supper and follow the Category 3 plan We discussed the following Behavioral Modification Strategies today: increasing lean protein intake and work on meal planning and easy cooking plans

## 2016-11-22 NOTE — Telephone Encounter (Signed)
Spoke with Lea at [facility]. She confirmed that referral was received and they are still working on referrals from 10/31/16. Advised her that pt is in pain and requesting status of referral. She states she will contact pt. Notified pt of status and provided her with number to call for appointment if she has not been contacted in the next couple of days.

## 2016-11-28 ENCOUNTER — Telehealth: Payer: Self-pay | Admitting: Family

## 2016-11-28 ENCOUNTER — Encounter: Payer: Self-pay | Admitting: Family

## 2016-11-28 NOTE — Telephone Encounter (Signed)
Caller name: Relation to TM:BPJP  Call back [place]:  Reason for call: pt was last seen on 11/16/16, states Melissa had provided her with a doctors note stating she could return to work however they are needed the note to indicate that she can return to work with no restrictions pt needs a updated note. Pt states to call her and she will have the fax number for you to fax the letter to. States it is needed today it is through a temp and they have a job for her on tomorrow

## 2016-11-28 NOTE — Telephone Encounter (Signed)
Could you please contact pt and ask her how he pain is doing?  If pain is improved I will write her back to work. We will need fax number as well.

## 2016-11-29 ENCOUNTER — Encounter: Payer: Self-pay | Admitting: Family

## 2016-11-29 NOTE — Telephone Encounter (Signed)
Please see email from 11/28/16.

## 2016-11-29 NOTE — Telephone Encounter (Signed)
Left message for pt to return my call. Also sent mychart message.

## 2016-11-29 NOTE — Telephone Encounter (Signed)
Patient returned call and requested letter please fax to [phone], confirmation received

## 2016-11-30 NOTE — Telephone Encounter (Signed)
Letter was faxed on 11/29/16 per below documentation.

## 2016-12-01 ENCOUNTER — Encounter (INDEPENDENT_AMBULATORY_CARE_PROVIDER_SITE_OTHER): Payer: Self-pay | Admitting: Physician Assistant

## 2016-12-01 NOTE — Telephone Encounter (Signed)
Please cancel

## 2016-12-05 ENCOUNTER — Encounter (INDEPENDENT_AMBULATORY_CARE_PROVIDER_SITE_OTHER): Payer: Self-pay

## 2016-12-05 ENCOUNTER — Ambulatory Visit (INDEPENDENT_AMBULATORY_CARE_PROVIDER_SITE_OTHER): Payer: BLUE CROSS/BLUE SHIELD | Admitting: Dietician

## 2016-12-12 ENCOUNTER — Other Ambulatory Visit: Payer: Self-pay | Admitting: Family

## 2016-12-13 ENCOUNTER — Other Ambulatory Visit: Payer: Self-pay | Admitting: Family

## 2016-12-16 ENCOUNTER — Encounter (INDEPENDENT_AMBULATORY_CARE_PROVIDER_SITE_OTHER): Payer: Self-pay | Admitting: Physician Assistant

## 2016-12-19 ENCOUNTER — Ambulatory Visit (INDEPENDENT_AMBULATORY_CARE_PROVIDER_SITE_OTHER): Payer: BLUE CROSS/BLUE SHIELD | Admitting: Physician Assistant

## 2016-12-19 ENCOUNTER — Encounter (INDEPENDENT_AMBULATORY_CARE_PROVIDER_SITE_OTHER): Payer: Self-pay

## 2016-12-19 NOTE — Telephone Encounter (Signed)
Pt has appt today req a r/s.   Thanks Sharmaine Bain

## 2016-12-20 ENCOUNTER — Ambulatory Visit (INDEPENDENT_AMBULATORY_CARE_PROVIDER_SITE_OTHER): Payer: BLUE CROSS/BLUE SHIELD | Admitting: Physician Assistant

## 2016-12-20 VITALS — BP 124/81 | HR 76 | Temp 98.8°F | Ht 63.0 in | Wt 255.0 lb

## 2016-12-20 DIAGNOSIS — F3289 Other specified depressive episodes: Secondary | ICD-10-CM

## 2016-12-20 DIAGNOSIS — E8881 Metabolic syndrome: Secondary | ICD-10-CM

## 2016-12-20 DIAGNOSIS — E559 Vitamin D deficiency, unspecified: Secondary | ICD-10-CM

## 2016-12-20 DIAGNOSIS — Z6841 Body Mass Index (BMI) 40.0 and over, adult: Secondary | ICD-10-CM

## 2016-12-20 MED ORDER — VITAMIN D (ERGOCALCIFEROL) 1.25 MG (50000 UNIT) PO CAPS: 50000.0000 [IU] | ORAL_CAPSULE | ORAL | 0 refills | Status: DC

## 2016-12-20 MED ORDER — TOPIRAMATE 100 MG PO TABS: 100.0000 mg | ORAL_TABLET | Freq: Every day | ORAL | 0 refills | Status: DC

## 2016-12-20 MED ORDER — METFORMIN HCL 500 MG PO TABS: 500.0000 mg | ORAL_TABLET | Freq: Two times a day (BID) | ORAL | 0 refills | Status: DC

## 2016-12-20 NOTE — Progress Notes (Signed)
Office: [phone]  /  Fax: [phone]   HPI:   Chief Complaint: OBESITY Sharon Rivers is here to discuss her progress with her obesity treatment plan. She is on the  keep a food journal with 600 calories and 40 grams of protein at supper daily and follow the Category 3 plan and is following her eating plan approximately 85 % of the time. She states she is walking for 30 minutes 5 times per week. Satia continues to do well with weight loss. She states her hunger is well controlled. She would like more variety with her meals.  Her weight is 255 lb (115.7 kg) today and has had a weight loss of 1 pound over a period of 4 weeks since her last visit. She has lost 25 lbs since starting treatment with [name].  Vitamin D deficiency Sharon Rivers has a diagnosis of vitamin D deficiency. She is currently taking prescription Vit D and denies nausea, vomiting, or muscle weakness.  Insulin Resistance Sharon Rivers has a diagnosis of insulin resistance based on her elevated fasting insulin level >5. Although Sharon Rivers's blood glucose readings are still under good control, insulin resistance puts her at greater risk of metabolic syndrome and diabetes. She is taking metformin currently and continues to work on diet and exercise to decrease risk of diabetes. She denies nausea, vomiting, or muscle weakness.  Depression with emotional eating behaviors Sharon Rivers is struggling with emotional eating and using food for comfort to the extent that it is negatively impacting her health. She often snacks when she is not hungry. Sharon Rivers sometimes feels she is out of control and then feels guilty that she made poor food choices. She has been working on behavior modification techniques to help reduce her emotional eating and has been somewhat successful. Her mood is stable and she shows no sign of suicidal or homicidal ideations.  Depression screen Sharon Rivers 2/9 03/10/2016 11/12/2015  Decreased Interest 3 0  Down, Depressed, Hopeless 3 1  PHQ - 2 Score 6  1  Altered sleeping 3 -  Tired, decreased energy 3 -  Change in appetite 3 -  Feeling bad or failure about yourself  3 -  Trouble concentrating 3 -  Moving slowly or fidgety/restless 2 -  PHQ-9 Score 23 -   ALLERGIES: Allergies  Allergen Reactions  . Adhesive [Tape] Other (See Comments)    Skin Burn.  Sharon Rivers [Menthol (Topical Analgesic)] Other (See Comments)    Skin Burn.  . Sulfa Antibiotics Rash    MEDICATIONS: Current Outpatient Medications on File Prior to Visit  Medication Sig Dispense Refill  . amLODipine (NORVASC) 10 MG tablet TAKE 1 TABLET(10 MG) BY MOUTH DAILY 30 tablet 4  . fluticasone (FLONASE) 50 MCG/ACT nasal spray Place 2 sprays into both nostrils daily. (Patient taking differently: Place 2 sprays daily into both nostrils. ) 16 g 0  . gabapentin (NEURONTIN) 300 MG capsule Take 300 mg 3 (three) times daily by mouth.     . levothyroxine (SYNTHROID, LEVOTHROID) 175 MCG tablet TAKE 1 TABLET(175 MCG) BY MOUTH DAILY BEFORE BREAKFAST 30 tablet 5  . lisinopril (PRINIVIL,ZESTRIL) 20 MG tablet TAKE 1 TABLET(20 MG) BY MOUTH DAILY 30 tablet 5  . metFORMIN (GLUCOPHAGE) 500 MG tablet Take 1 tablet (500 mg total) by mouth 2 (two) times daily with a meal. 60 tablet 0  . NORLYDA 0.35 MG tablet TAKE 1 TABLET(0.35 MG) BY MOUTH DAILY 28 tablet 11  . ondansetron (ZOFRAN) 4 MG tablet Take 1 tablet (4 mg total) by mouth every  8 (eight) hours as needed for nausea or vomiting. 20 tablet 0  . pantoprazole (PROTONIX) 40 MG tablet TAKE 1 TABLET(40 MG) BY MOUTH DAILY 30 tablet 5  . SUMAtriptan (IMITREX) 100 MG tablet Take 1 tablet earliest onset of migraine.  May repeat once in 2 hours if headache persists or recurs.  Do not exceed more than 2 tablets in 24 hours 10 tablet 2  . topiramate (TOPAMAX) 100 MG tablet Take 1 tablet (100 mg total) by mouth at bedtime. 30 tablet 0  . venlafaxine XR (EFFEXOR-XR) 37.5 MG 24 hr capsule TAKE 2 CAPSULES BY MOUTH DAILY 60 capsule 5  . Vitamin D,  Ergocalciferol, (DRISDOL) 50000 units CAPS capsule Take 1 capsule (50,000 Units total) by mouth every 7 (seven) days. 4 capsule 0   No current facility-administered medications on file prior to visit.     PAST MEDICAL HISTORY: Past Medical History:  Diagnosis Date  . Anxiety   . Back pain   . Chest pain    a. 07/2015 Myoview: EF 71%, medium size, mild intensity, partially reversible septal defect with overlying breast attenuation -> likely artifact. No significant reversible ischemia.  . Constipation   . Depression   . Edema    feet and legs  . Frequent headaches   . GERD (gastroesophageal reflux disease)   . Hypertension   . Hypothyroidism   . Joint pain   . Migraines   . OSA (obstructive sleep apnea) 03/17/2016  . Osteoarthritis   . Panic anxiety syndrome   . SOB (shortness of breath)    a. 04/2016 Echo: EF 60-65%, Gr1 DD.  [name] Swallowing difficulty     PAST SURGICAL HISTORY: Past Surgical History:  Procedure Laterality Date  . CESAREAN SECTION  2001 & 2002  . ELBOW SURGERY    . MINOR CARPAL TUNNEL     pinched nerve in elbow  . WISDOM TOOTH EXTRACTION      SOCIAL HISTORY: Social History   Tobacco Use  . Smoking status: Former Research scientist (life sciences)  . Smokeless tobacco: Never Used  . Tobacco comment: quit 1992  Substance Use Topics  . Alcohol use: No    Alcohol/week: 0.0 oz  . Drug use: No    FAMILY HISTORY: Family History  Problem Relation Age of Onset  . Hypertension Mother        Living  . Arthritis Mother   . Thyroid disease Mother   . Heart disease Mother        MI at age 57  . Heart attack Mother   . Migraines Mother   . Depression Mother   . Obesity Mother   . Hypertension Maternal Grandmother   . Parkinson's disease Maternal Grandmother   . Alzheimer's disease Maternal Grandmother   . Cancer Maternal Grandfather   . Hypertension Maternal Grandfather   . Hypertension Paternal Grandmother   . Hypertension Paternal Grandfather   . Gout Brother   . ADD / ADHD  Son        x1  . Other Son        #2-Unknown  . Diabetes Neg Hx     ROS: Review of Systems  Constitutional: Positive for weight loss.  Gastrointestinal: Negative for nausea and vomiting.  Musculoskeletal:       Negative muscle weakness  Psychiatric/Behavioral: Positive for depression. Negative for suicidal ideas.    PHYSICAL EXAM: Blood pressure 124/81, pulse 76, temperature 98.8 F (37.1 C), temperature source Oral, height 5\' 3"  (1.6 m), weight 255 lb (115.7  kg), last menstrual period 11/22/2016, SpO2 100 %. Body mass index is 45.17 kg/m. Physical Exam  Constitutional: She is oriented to person, place, and time. She appears well-developed and well-nourished.  Cardiovascular: Normal rate.  Pulmonary/Chest: Effort normal.  Musculoskeletal: Normal range of motion.  Neurological: She is oriented to person, place, and time.  Skin: Skin is warm and dry.  Psychiatric: She has a normal mood and affect.  Vitals reviewed.   RECENT LABS AND TESTS: BMET    Component Value Date/Time   NA 139 10/31/2016 0835   K 3.9 10/31/2016 0835   CL 109 (H) 10/31/2016 0835   CO2 19 (L) 10/31/2016 0835   GLUCOSE 86 10/31/2016 0835   GLUCOSE 77 06/27/2016 1629   BUN 13 10/31/2016 0835   CREATININE 0.67 10/31/2016 0835   CALCIUM 8.7 10/31/2016 0835   GFRNONAA 107 10/31/2016 0835   GFRAA 124 10/31/2016 0835   Lab Results  Component Value Date   HGBA1C 4.6 (L) 10/31/2016   HGBA1C 4.8 03/10/2016   HGBA1C 4.7 07/22/2015   Lab Results  Component Value Date   INSULIN 14.3 10/31/2016   INSULIN 37.1 (H) 03/10/2016   CBC    Component Value Date/Time   WBC 7.1 06/27/2016 1629   RBC 4.27 06/27/2016 1629   HGB 12.4 06/27/2016 1629   HCT 36.4 06/27/2016 1629   PLT 356.0 06/27/2016 1629   MCV 85.1 06/27/2016 1629   MCH 28.9 03/22/2016 1828   MCHC 34.2 06/27/2016 1629   RDW 14.0 06/27/2016 1629   LYMPHSABS 1.7 06/27/2016 1629   MONOABS 0.6 06/27/2016 1629   EOSABS 0.2 06/27/2016 1629    BASOSABS 0.1 06/27/2016 1629   Iron/TIBC/Ferritin/ %Sat No results found for: IRON, TIBC, FERRITIN, IRONPCTSAT Lipid Panel     Component Value Date/Time   CHOL 123 03/10/2016 1051   TRIG 87 03/10/2016 1051   HDL 38 (L) 03/10/2016 1051   CHOLHDL 3 07/22/2015 0847   VLDL 11.2 07/22/2015 0847   LDLCALC 68 03/10/2016 1051   Hepatic Function Panel     Component Value Date/Time   PROT 6.8 10/31/2016 0835   ALBUMIN 4.1 10/31/2016 0835   AST 11 10/31/2016 0835   ALT 9 10/31/2016 0835   ALKPHOS 65 10/31/2016 0835   BILITOT 0.3 10/31/2016 0835      Component Value Date/Time   TSH 1.040 10/31/2016 0835   TSH 1.98 04/21/2016 0709   TSH 0.428 (L) 03/10/2016 1051    ASSESSMENT AND PLAN: Vitamin D deficiency - Plan: Vitamin D, Ergocalciferol, (DRISDOL) 50000 units CAPS capsule  Insulin resistance - Plan: metFORMIN (GLUCOPHAGE) 500 MG tablet  Other depression - with emotional eating - Plan: topiramate (TOPAMAX) 100 MG tablet  Class 3 severe obesity with serious comorbidity and body mass index (BMI) of 45.0 to 49.9 in adult, unspecified obesity type (Sharon Rivers)  PLAN:  Vitamin D Deficiency Sharon Rivers was informed that low vitamin D levels contributes to fatigue and are associated with obesity, breast, and colon cancer. Sharon Rivers agrees to continue taking prescription Vit D @50 ,000 IU every week #4 and we will refill for 1 month and she will follow up for routine testing of vitamin D, at least 2-3 times per year. She was informed of the risk of over-replacement of vitamin D and agrees to not increase her dose unless he discusses this with [name] first. Sharon Rivers agrees to follow up with our clinic in 3 weeks.  Insulin Resistance Sharon Rivers will continue to work on weight loss, exercise, and decreasing simple carbohydrates  in her diet to help decrease the risk of diabetes. We dicussed metformin including benefits and risks. She was informed that eating too many simple carbohydrates or too many calories at one  sitting increases the likelihood of GI side effects. Sharon Rivers agrees to continue taking metformin 500 mg BID #60 and we will refill for 1 month. Sharon Rivers agrees to follow up with our clinic in 3 weeks as directed to monitor her progress.  Depression with Emotional Eating Behaviors We discussed behavior modification techniques today to help Sharon Rivers deal with her emotional eating and depression. Sharon Rivers agrees to continue taking Topiramate 100 mg qd #30 and we will refill for 1 month. Sharon Rivers agrees to follow up with our clinic in 3 weeks.  Obesity Sharon Rivers is currently in the action stage of change. As such, her goal is to continue with weight loss efforts She has agreed to change to keep a food journal with 1500 calories and 90 grams of protein daily Sharon Rivers has been instructed to work up to a goal of 150 minutes of combined cardio and strengthening exercise per week for weight loss and overall health benefits. We discussed the following Behavioral Modification Strategies today: increasing lean protein intake and keep a strict food journal   Sharon Rivers has agreed to follow up with our clinic in 3 weeks. She was informed of the importance of frequent follow up visits to maximize her success with intensive lifestyle modifications for her multiple health conditions.  Sharon Rivers, Sharon Rivers Dredge, am acting as transcriptionist for Lacy Duverney, PA-C  Sharon Rivers have reviewed the above documentation for accuracy and completeness, and Sharon Rivers agree with the above. -Lacy Duverney, PA-C  Sharon Rivers have reviewed the above note and agree with the plan. -Dennard Nip, MD      Today's visit was # 17 out of 22.  Starting weight: 280 lbs Starting date: 03/10/16 Today's weight : 255 lbs  Today's date: 12/20/2016 Total lbs lost to date: 25 (Patients must lose 7 lbs in the first 6 months to continue with counseling)   ASK: We discussed the diagnosis of obesity with [name] today and Sharon agreed to give [name] permission to discuss obesity  behavioral modification therapy today.  ASSESS: Zyon has the diagnosis of obesity and her BMI today is 45.18 Cindie is in the action stage of change   ADVISE: Ola was educated on the multiple health risks of obesity as well as the benefit of weight loss to improve her health. She was advised of the need for long term treatment and the importance of lifestyle modifications.  AGREE: Multiple dietary modification options and treatment options were discussed and  Kitiara agreed to keep a food journal with 1500 calories and 90 grams of protein daily We discussed the following Behavioral Modification Strategies today: increasing lean protein intake and keep a strict food journal

## 2017-01-11 ENCOUNTER — Ambulatory Visit (INDEPENDENT_AMBULATORY_CARE_PROVIDER_SITE_OTHER): Payer: BLUE CROSS/BLUE SHIELD | Admitting: Physician Assistant

## 2017-01-11 ENCOUNTER — Encounter (INDEPENDENT_AMBULATORY_CARE_PROVIDER_SITE_OTHER): Payer: Self-pay | Admitting: Physician Assistant

## 2017-01-11 VITALS — BP 112/77 | HR 73 | Temp 98.5°F | Ht 63.0 in | Wt 255.0 lb

## 2017-01-11 DIAGNOSIS — Z6841 Body Mass Index (BMI) 40.0 and over, adult: Secondary | ICD-10-CM

## 2017-01-11 DIAGNOSIS — E559 Vitamin D deficiency, unspecified: Secondary | ICD-10-CM | POA: Diagnosis not present

## 2017-01-11 NOTE — Telephone Encounter (Signed)
Please change appt. Sharon Rivers

## 2017-01-11 NOTE — Progress Notes (Signed)
Office: [phone]  /  Fax: [phone]   HPI:   Chief Complaint: OBESITY Sharon Rivers is here to discuss her progress with her obesity treatment plan. She is on the keep a food journal with 1500 calories and 90 grams of protein daily and is following her eating plan approximately 85 % of the time. She states she is walking for 30 minutes 5 times per week. Sharon Rivers maintained her weight. She is mindful of her eating and controls her portions, although she recently lost her job. Sharon Rivers tries to avoid emotional eating. Her weight is 255 lb (115.7 kg) today and has maintained weight over a period of 3 weeks since her last visit. She has lost 25 lbs since starting treatment with [name].  Vitamin D deficiency Sharon Rivers has a diagnosis of vitamin D deficiency. She is currently taking vit D and denies nausea, vomiting or muscle weakness.  ALLERGIES: Allergies  Allergen Reactions  . Adhesive [Tape] Other (See Comments)    Skin Burn.  Sharon Rivers [Menthol (Topical Analgesic)] Other (See Comments)    Skin Burn.  . Sulfa Antibiotics Rash    MEDICATIONS: Current Outpatient Medications on File Prior to Visit  Medication Sig Dispense Refill  . amLODipine (NORVASC) 10 MG tablet TAKE 1 TABLET(10 MG) BY MOUTH DAILY 30 tablet 4  . fluticasone (FLONASE) 50 MCG/ACT nasal spray Place 2 sprays into both nostrils daily. (Patient taking differently: Place 2 sprays daily into both nostrils. ) 16 g 0  . gabapentin (NEURONTIN) 300 MG capsule Take 300 mg 3 (three) times daily by mouth.     . levothyroxine (SYNTHROID, LEVOTHROID) 175 MCG tablet TAKE 1 TABLET(175 MCG) BY MOUTH DAILY BEFORE BREAKFAST 30 tablet 5  . lisinopril (PRINIVIL,ZESTRIL) 20 MG tablet TAKE 1 TABLET(20 MG) BY MOUTH DAILY 30 tablet 5  . metFORMIN (GLUCOPHAGE) 500 MG tablet Take 1 tablet (500 mg total) 2 (two) times daily with a meal by mouth. 60 tablet 0  . NORLYDA 0.35 MG tablet TAKE 1 TABLET(0.35 MG) BY MOUTH DAILY 28 tablet 11  . ondansetron  (ZOFRAN) 4 MG tablet Take 1 tablet (4 mg total) by mouth every 8 (eight) hours as needed for nausea or vomiting. 20 tablet 0  . pantoprazole (PROTONIX) 40 MG tablet TAKE 1 TABLET(40 MG) BY MOUTH DAILY 30 tablet 5  . SUMAtriptan (IMITREX) 100 MG tablet Take 1 tablet earliest onset of migraine.  May repeat once in 2 hours if headache persists or recurs.  Do not exceed more than 2 tablets in 24 hours 10 tablet 2  . topiramate (TOPAMAX) 100 MG tablet Take 1 tablet (100 mg total) at bedtime by mouth. 30 tablet 0  . venlafaxine XR (EFFEXOR-XR) 37.5 MG 24 hr capsule TAKE 2 CAPSULES BY MOUTH DAILY 60 capsule 5  . Vitamin D, Ergocalciferol, (DRISDOL) 50000 units CAPS capsule Take 1 capsule (50,000 Units total) every 7 (seven) days by mouth. 4 capsule 0   No current facility-administered medications on file prior to visit.     PAST MEDICAL HISTORY: Past Medical History:  Diagnosis Date  . Anxiety   . Back pain   . Chest pain    a. 07/2015 Myoview: EF 71%, medium size, mild intensity, partially reversible septal defect with overlying breast attenuation -> likely artifact. No significant reversible ischemia.  . Constipation   . Depression   . Edema    feet and legs  . Frequent headaches   . GERD (gastroesophageal reflux disease)   . Hypertension   . Hypothyroidism   .  Joint pain   . Migraines   . OSA (obstructive sleep apnea) 03/17/2016  . Osteoarthritis   . Panic anxiety syndrome   . SOB (shortness of breath)    a. 04/2016 Echo: EF 60-65%, Gr1 DD.  [name] Swallowing difficulty     PAST SURGICAL HISTORY: Past Surgical History:  Procedure Laterality Date  . CESAREAN SECTION  2001 & 2002  . ELBOW SURGERY    . MINOR CARPAL TUNNEL     pinched nerve in elbow  . WISDOM TOOTH EXTRACTION      SOCIAL HISTORY: Social History   Tobacco Use  . Smoking status: Former Research scientist (life sciences)  . Smokeless tobacco: Never Used  . Tobacco comment: quit 1992  Substance Use Topics  . Alcohol use: No    Alcohol/week: 0.0  oz  . Drug use: No    FAMILY HISTORY: Family History  Problem Relation Age of Onset  . Hypertension Mother        Living  . Arthritis Mother   . Thyroid disease Mother   . Heart disease Mother        MI at age 72  . Heart attack Mother   . Migraines Mother   . Depression Mother   . Obesity Mother   . Hypertension Maternal Grandmother   . Parkinson's disease Maternal Grandmother   . Alzheimer's disease Maternal Grandmother   . Cancer Maternal Grandfather   . Hypertension Maternal Grandfather   . Hypertension Paternal Grandmother   . Hypertension Paternal Grandfather   . Gout Brother   . ADD / ADHD Son        x1  . Other Son        #2-Unknown  . Diabetes Neg Hx     ROS: Review of Systems  Constitutional: Negative for weight loss.  Gastrointestinal: Negative for nausea and vomiting.  Musculoskeletal:       Negative muscle weakness    PHYSICAL EXAM: Blood pressure 112/77, pulse 73, temperature 98.5 F (36.9 C), temperature source Oral, height 5\' 3"  (1.6 m), weight 255 lb (115.7 kg), SpO2 99 %. Body mass index is 45.17 kg/m. Physical Exam  Constitutional: She is oriented to person, place, and time. She appears well-developed and well-nourished.  Cardiovascular: Normal rate.  Pulmonary/Chest: Effort normal.  Musculoskeletal: Normal range of motion.  Neurological: She is oriented to person, place, and time.  Skin: Skin is warm and dry.  Psychiatric: She has a normal mood and affect. Her behavior is normal.  Vitals reviewed.   RECENT LABS AND TESTS: BMET    Component Value Date/Time   NA 139 10/31/2016 0835   K 3.9 10/31/2016 0835   CL 109 (H) 10/31/2016 0835   CO2 19 (L) 10/31/2016 0835   GLUCOSE 86 10/31/2016 0835   GLUCOSE 77 06/27/2016 1629   BUN 13 10/31/2016 0835   CREATININE 0.67 10/31/2016 0835   CALCIUM 8.7 10/31/2016 0835   GFRNONAA 107 10/31/2016 0835   GFRAA 124 10/31/2016 0835   Lab Results  Component Value Date   HGBA1C 4.6 (L)  10/31/2016   HGBA1C 4.8 03/10/2016   HGBA1C 4.7 07/22/2015   Lab Results  Component Value Date   INSULIN 14.3 10/31/2016   INSULIN 37.1 (H) 03/10/2016   CBC    Component Value Date/Time   WBC 7.1 06/27/2016 1629   RBC 4.27 06/27/2016 1629   HGB 12.4 06/27/2016 1629   HCT 36.4 06/27/2016 1629   PLT 356.0 06/27/2016 1629   MCV 85.1 06/27/2016 1629  MCH 28.9 03/22/2016 1828   MCHC 34.2 06/27/2016 1629   RDW 14.0 06/27/2016 1629   LYMPHSABS 1.7 06/27/2016 1629   MONOABS 0.6 06/27/2016 1629   EOSABS 0.2 06/27/2016 1629   BASOSABS 0.1 06/27/2016 1629   Iron/TIBC/Ferritin/ %Sat No results found for: IRON, TIBC, FERRITIN, IRONPCTSAT Lipid Panel     Component Value Date/Time   CHOL 123 03/10/2016 1051   TRIG 87 03/10/2016 1051   HDL 38 (L) 03/10/2016 1051   CHOLHDL 3 07/22/2015 0847   VLDL 11.2 07/22/2015 0847   LDLCALC 68 03/10/2016 1051   Hepatic Function Panel     Component Value Date/Time   PROT 6.8 10/31/2016 0835   ALBUMIN 4.1 10/31/2016 0835   AST 11 10/31/2016 0835   ALT 9 10/31/2016 0835   ALKPHOS 65 10/31/2016 0835   BILITOT 0.3 10/31/2016 0835      Component Value Date/Time   TSH 1.040 10/31/2016 0835   TSH 1.98 04/21/2016 0709   TSH 0.428 (L) 03/10/2016 1051    ASSESSMENT AND PLAN: Vitamin D deficiency  Class 3 severe obesity with serious comorbidity and body mass index (BMI) of 45.0 to 49.9 in adult, unspecified obesity type ([place])  PLAN:  Vitamin D Deficiency Sharon Rivers was informed that low vitamin D levels contributes to fatigue and are associated with obesity, breast, and colon cancer. She agrees to continue to take prescription Vit D @50 ,000 IU every week and will follow up for routine testing of vitamin D, at least 2-3 times per year. She was informed of the risk of over-replacement of vitamin D and agrees to not increase her dose unless he discusses this with [name] first.  We spent > than 50% of the 15 minute visit on the counseling as documented  in the note.  Obesity Sharon Rivers is currently in the action stage of change. As such, her goal is to continue with weight loss efforts She has agreed to keep a food journal with 1500 calories and 90 grams of protein daily Sharon Rivers has been instructed to work up to a goal of 150 minutes of combined cardio and strengthening exercise per week for weight loss and overall health benefits. We discussed the following Behavioral Modification Strategies today: increasing lean protein intake and work on meal planning and easy cooking plans  Sharon Rivers has agreed to follow up with our clinic in 3 weeks. She was informed of the importance of frequent follow up visits to maximize her success with intensive lifestyle modifications for her multiple health conditions.  I, Doreene Nest, am acting as transcriptionist for Lacy Duverney, PA-C  I have reviewed the above documentation for accuracy and completeness, and I agree with the above. -Lacy Duverney, PA-C  I have reviewed the above note and agree with the plan. -Dennard Nip, MD   OBESITY BEHAVIORAL INTERVENTION VISIT  Today's visit was # 18 out of 22.  Starting weight: 280 lbs Starting date: 03/10/16 Today's weight : 255 lbs Today's date: 01/11/2017 Total lbs lost to date: 25 (Patients must lose 7 lbs in the first 6 months to continue with counseling)   ASK: We discussed the diagnosis of obesity with Sharon Rivers today and Sharon Rivers agreed to give [name] permission to discuss obesity behavioral modification therapy today.  ASSESS: Sharon Rivers has the diagnosis of obesity and her BMI today is 45.18 Sharon Rivers is in the action stage of change   ADVISE: Sharon Rivers was educated on the multiple health risks of obesity as well as the benefit of weight loss to improve  her health. She was advised of the need for long term treatment and the importance of lifestyle modifications.  AGREE: Multiple dietary modification options and treatment options were discussed and  Sharon Rivers agreed  to keep a food journal with 1500 calories and 90 grams of protein daily We discussed the following Behavioral Modification Strategies today: increasing lean protein intake and work on meal planning and easy cooking plans

## 2017-01-13 ENCOUNTER — Other Ambulatory Visit: Payer: Self-pay | Admitting: Family

## 2017-02-01 ENCOUNTER — Ambulatory Visit (INDEPENDENT_AMBULATORY_CARE_PROVIDER_SITE_OTHER): Payer: BLUE CROSS/BLUE SHIELD | Admitting: Physician Assistant

## 2017-02-01 VITALS — BP 103/68 | HR 95 | Temp 97.9°F | Ht 63.0 in | Wt 253.0 lb

## 2017-02-01 DIAGNOSIS — E559 Vitamin D deficiency, unspecified: Secondary | ICD-10-CM | POA: Diagnosis not present

## 2017-02-01 DIAGNOSIS — E88819 Insulin resistance, unspecified: Secondary | ICD-10-CM

## 2017-02-01 DIAGNOSIS — E8881 Metabolic syndrome: Secondary | ICD-10-CM

## 2017-02-01 DIAGNOSIS — F3289 Other specified depressive episodes: Secondary | ICD-10-CM | POA: Diagnosis not present

## 2017-02-01 DIAGNOSIS — E66813 Obesity, class 3: Secondary | ICD-10-CM

## 2017-02-01 DIAGNOSIS — Z6841 Body Mass Index (BMI) 40.0 and over, adult: Secondary | ICD-10-CM

## 2017-02-01 MED ORDER — VITAMIN D (ERGOCALCIFEROL) 1.25 MG (50000 UNIT) PO CAPS: 50000.0000 [IU] | ORAL_CAPSULE | ORAL | 0 refills | Status: DC

## 2017-02-01 MED ORDER — TOPIRAMATE 100 MG PO TABS: 100.0000 mg | ORAL_TABLET | Freq: Every day | ORAL | 0 refills | Status: DC

## 2017-02-01 MED ORDER — METFORMIN HCL 500 MG PO TABS: 500.0000 mg | ORAL_TABLET | Freq: Two times a day (BID) | ORAL | 0 refills | Status: DC

## 2017-02-01 NOTE — Progress Notes (Signed)
Office: [phone]  /  Fax: [phone]   HPI:   Chief Complaint: OBESITY Sharon Rivers is here to discuss her progress with her obesity treatment plan. She is on the keep a food journal with 1500 calories and 90 grams of protein daily and is following her eating plan approximately 85 % of the time. She states she is doing stretches for 30 minutes 2-3 times per week. Sharon Rivers continues to do well with weight loss. She is mindful of her eating and controls her portions.  Her weight is 253 lb (114.8 kg) today and has had a weight loss of 2 pounds over a period of 3 weeks since her last visit. She has lost 27 lbs since starting treatment with [name].  Vitamin D deficiency Sharon Rivers has a diagnosis of vitamin D deficiency. She is currently taking prescription Vit D, level at 33 and not yet at goal. She denies nausea, vomiting or muscle weakness.  Insulin Resistance Sharon Rivers has a diagnosis of insulin resistance based on her elevated fasting insulin level >5. Although Sharon Rivers's blood glucose readings are still under good control, insulin resistance puts her at greater risk of metabolic syndrome and diabetes. She is taking metformin currently and continues to work on diet and exercise to decrease risk of diabetes. She denies polyphagia.  Depression with emotional eating behaviors Sharon Rivers is struggling with emotional eating and using food for comfort to the extent that it is negatively impacting her health. She often snacks when she is not hungry. Sharon Rivers sometimes feels she is out of control and then feels guilty that she made poor food choices. She has been working on behavior modification techniques to help reduce her emotional eating and has been somewhat successful. Her mood is stable and she shows no sign of suicidal or homicidal ideations.  Depression screen Sharon Rivers 2/9 03/10/2016 11/12/2015  Decreased Interest 3 0  Down, Depressed, Hopeless 3 1  PHQ - 2 Score 6 1  Altered sleeping 3 -  Tired, decreased energy 3  -  Change in appetite 3 -  Feeling bad or failure about yourself  3 -  Trouble concentrating 3 -  Moving slowly or fidgety/restless 2 -  PHQ-9 Score 23 -   ALLERGIES: Allergies  Allergen Reactions  . Adhesive [Tape] Other (See Comments)    Skin Burn.  Sharon Rivers [Menthol (Topical Analgesic)] Other (See Comments)    Skin Burn.  . Sulfa Antibiotics Rash    MEDICATIONS: Current Outpatient Medications on File Prior to Visit  Medication Sig Dispense Refill  . amLODipine (NORVASC) 10 MG tablet TAKE 1 TABLET(10 MG) BY MOUTH DAILY 30 tablet 4  . fluticasone (FLONASE) 50 MCG/ACT nasal spray Place 2 sprays into both nostrils daily. (Patient taking differently: Place 2 sprays daily into both nostrils. ) 16 g 0  . gabapentin (NEURONTIN) 300 MG capsule Take 300 mg 3 (three) times daily by mouth.     . levothyroxine (SYNTHROID, LEVOTHROID) 175 MCG tablet TAKE 1 TABLET(175 MCG) BY MOUTH DAILY BEFORE BREAKFAST 30 tablet 0  . lisinopril (PRINIVIL,ZESTRIL) 20 MG tablet TAKE 1 TABLET(20 MG) BY MOUTH DAILY 30 tablet 5  . metFORMIN (GLUCOPHAGE) 500 MG tablet Take 1 tablet (500 mg total) 2 (two) times daily with a meal by mouth. 60 tablet 0  . NORLYDA 0.35 MG tablet TAKE 1 TABLET(0.35 MG) BY MOUTH DAILY 28 tablet 11  . ondansetron (ZOFRAN) 4 MG tablet Take 1 tablet (4 mg total) by mouth every 8 (eight) hours as needed for nausea or vomiting.  20 tablet 0  . pantoprazole (PROTONIX) 40 MG tablet TAKE 1 TABLET(40 MG) BY MOUTH DAILY 30 tablet 5  . SUMAtriptan (IMITREX) 100 MG tablet Take 1 tablet earliest onset of migraine.  May repeat once in 2 hours if headache persists or recurs.  Do not exceed more than 2 tablets in 24 hours 10 tablet 2  . topiramate (TOPAMAX) 100 MG tablet Take 1 tablet (100 mg total) at bedtime by mouth. 30 tablet 0  . venlafaxine XR (EFFEXOR-XR) 37.5 MG 24 hr capsule TAKE 2 CAPSULES BY MOUTH DAILY 60 capsule 5  . Vitamin D, Ergocalciferol, (DRISDOL) 50000 units CAPS capsule Take 1  capsule (50,000 Units total) every 7 (seven) days by mouth. 4 capsule 0   No current facility-administered medications on file prior to visit.     PAST MEDICAL HISTORY: Past Medical History:  Diagnosis Date  . Anxiety   . Back pain   . Chest pain    a. 07/2015 Myoview: EF 71%, medium size, mild intensity, partially reversible septal defect with overlying breast attenuation -> likely artifact. No significant reversible ischemia.  . Constipation   . Depression   . Edema    feet and legs  . Frequent headaches   . GERD (gastroesophageal reflux disease)   . Hypertension   . Hypothyroidism   . Joint pain   . Migraines   . OSA (obstructive sleep apnea) 03/17/2016  . Osteoarthritis   . Panic anxiety syndrome   . SOB (shortness of breath)    a. 04/2016 Echo: EF 60-65%, Gr1 DD.  [name] Swallowing difficulty     PAST SURGICAL HISTORY: Past Surgical History:  Procedure Laterality Date  . CESAREAN SECTION  2001 & 2002  . ELBOW SURGERY    . MINOR CARPAL TUNNEL     pinched nerve in elbow  . WISDOM TOOTH EXTRACTION      SOCIAL HISTORY: Social History   Tobacco Use  . Smoking status: Former Research scientist (life sciences)  . Smokeless tobacco: Never Used  . Tobacco comment: quit 1992  Substance Use Topics  . Alcohol use: No    Alcohol/week: 0.0 oz  . Drug use: No    FAMILY HISTORY: Family History  Problem Relation Age of Onset  . Hypertension Mother        Living  . Arthritis Mother   . Thyroid disease Mother   . Heart disease Mother        MI at age 90  . Heart attack Mother   . Migraines Mother   . Depression Mother   . Obesity Mother   . Hypertension Maternal Grandmother   . Parkinson's disease Maternal Grandmother   . Alzheimer's disease Maternal Grandmother   . Cancer Maternal Grandfather   . Hypertension Maternal Grandfather   . Hypertension Paternal Grandmother   . Hypertension Paternal Grandfather   . Gout Brother   . ADD / ADHD Son        x1  . Other Son        #2-Unknown  .  Diabetes Neg Hx     ROS: Review of Systems  Constitutional: Positive for weight loss.  Gastrointestinal: Negative for nausea and vomiting.  Musculoskeletal:       Negative muscle weakness  Endo/Heme/Allergies:       Negative polyphagia  Psychiatric/Behavioral: Positive for depression. Negative for suicidal ideas.    PHYSICAL EXAM: Blood pressure 103/68, pulse 95, temperature 97.9 F (36.6 C), temperature source Oral, height 5\' 3"  (1.6 m), weight 253 lb (  114.8 kg), SpO2 99 %. Body mass index is 44.82 kg/m. Physical Exam  Constitutional: She is oriented to person, place, and time. She appears well-developed and well-nourished.  Cardiovascular: Normal rate.  Pulmonary/Chest: Effort normal.  Musculoskeletal: Normal range of motion.  Neurological: She is oriented to person, place, and time.  Skin: Skin is warm and dry.  Psychiatric: She has a normal mood and affect.  Vitals reviewed.   RECENT LABS AND TESTS: BMET    Component Value Date/Time   NA 139 10/31/2016 0835   K 3.9 10/31/2016 0835   CL 109 (H) 10/31/2016 0835   CO2 19 (L) 10/31/2016 0835   GLUCOSE 86 10/31/2016 0835   GLUCOSE 77 06/27/2016 1629   BUN 13 10/31/2016 0835   CREATININE 0.67 10/31/2016 0835   CALCIUM 8.7 10/31/2016 0835   GFRNONAA 107 10/31/2016 0835   GFRAA 124 10/31/2016 0835   Lab Results  Component Value Date   HGBA1C 4.6 (L) 10/31/2016   HGBA1C 4.8 03/10/2016   HGBA1C 4.7 07/22/2015   Lab Results  Component Value Date   INSULIN 14.3 10/31/2016   INSULIN 37.1 (H) 03/10/2016   CBC    Component Value Date/Time   WBC 7.1 06/27/2016 1629   RBC 4.27 06/27/2016 1629   HGB 12.4 06/27/2016 1629   HCT 36.4 06/27/2016 1629   PLT 356.0 06/27/2016 1629   MCV 85.1 06/27/2016 1629   MCH 28.9 03/22/2016 1828   MCHC 34.2 06/27/2016 1629   RDW 14.0 06/27/2016 1629   LYMPHSABS 1.7 06/27/2016 1629   MONOABS 0.6 06/27/2016 1629   EOSABS 0.2 06/27/2016 1629   BASOSABS 0.1 06/27/2016 1629    Iron/TIBC/Ferritin/ %Sat No results found for: IRON, TIBC, FERRITIN, IRONPCTSAT Lipid Panel     Component Value Date/Time   CHOL 123 03/10/2016 1051   TRIG 87 03/10/2016 1051   HDL 38 (L) 03/10/2016 1051   CHOLHDL 3 07/22/2015 0847   VLDL 11.2 07/22/2015 0847   LDLCALC 68 03/10/2016 1051   Hepatic Function Panel     Component Value Date/Time   PROT 6.8 10/31/2016 0835   ALBUMIN 4.1 10/31/2016 0835   AST 11 10/31/2016 0835   ALT 9 10/31/2016 0835   ALKPHOS 65 10/31/2016 0835   BILITOT 0.3 10/31/2016 0835      Component Value Date/Time   TSH 1.040 10/31/2016 0835   TSH 1.98 04/21/2016 0709   TSH 0.428 (L) 03/10/2016 1051    ASSESSMENT AND PLAN: Vitamin D deficiency - Plan: Vitamin D, Ergocalciferol, (DRISDOL) 50000 units CAPS capsule  Insulin resistance - Plan: metFORMIN (GLUCOPHAGE) 500 MG tablet  Other depression - with emotional eating - Plan: topiramate (TOPAMAX) 100 MG tablet  Class 3 severe obesity with serious comorbidity and body mass index (BMI) of 40.0 to 44.9 in adult, unspecified obesity type ([place])  PLAN:  Vitamin D Deficiency Sharon Rivers was informed that low vitamin D levels contributes to fatigue and are associated with obesity, breast, and colon cancer. Wafaa agrees to continue taking prescription Vit D @50 ,000 IU every week #4 and we will refill for 1 month. She will follow up for routine testing of vitamin D, at least 2-3 times per year. She was informed of the risk of over-replacement of vitamin D and agrees to not increase her dose unless he discusses this with [name] first. Sharon Rivers agrees to follow up with our clinic in 2 to 3 weeks.  Insulin Resistance Sharon Rivers will continue to work on weight loss, exercise, and decreasing simple carbohydrates in her  diet to help decrease the risk of diabetes. We dicussed metformin including benefits and risks. She was informed that eating too many simple carbohydrates or too many calories at one sitting increases the  likelihood of GI side effects. Sharon Rivers agrees to continue taking metformin 500 mg BID #60 and we will refill for 1 month. Sharon Rivers agrees to follow up with our clinic in 2 to 3 weeks as directed to monitor her progress.  Depression with Emotional Eating Behaviors We discussed behavior modification techniques today to help Sharon Rivers deal with her emotional eating and depression. Sharon Rivers agrees to continue taking topiramate 100 mg qhs #30 and we will refill for 1 month. Sharon Rivers agrees to follow up with our clinic in 2 to 3 weeks.  Obesity Sharon Rivers is currently in the action stage of change. As such, her goal is to continue with weight loss efforts She has agreed to keep a food journal with 1500 calories and 90 grams of protein daily Sharon Rivers has been instructed to work up to a goal of 150 minutes of combined cardio and strengthening exercise per week for weight loss and overall health benefits. We discussed the following Behavioral Modification Strategies today: increasing lean protein intake and planning for success   Sharon Rivers has agreed to follow up with our clinic in 2 to 3 weeks. She was informed of the importance of frequent follow up visits to maximize her success with intensive lifestyle modifications for her multiple health conditions.  I, Trixie Dredge, am acting as transcriptionist for Lacy Duverney, PA-C  I have reviewed the above documentation for accuracy and completeness, and I agree with the above. -Lacy Duverney, PA-C  I have reviewed the above note and agree with the plan. -Dennard Nip, MD     Today's visit was # 19 out of 22.  Starting weight: 280 lbs Starting date: 03/10/16 Today's weight : 253 lbs  Today's date: 02/01/2017 Total lbs lost to date: 59 (Patients must lose 7 lbs in the first 6 months to continue with counseling)   ASK: We discussed the diagnosis of obesity with [name] today and Zahava agreed to give [name] permission to discuss obesity behavioral modification  therapy today.  ASSESS: Destine has the diagnosis of obesity and her BMI today is 44.83 Tyyne is in the action stage of change   ADVISE: Sharon Rivers was educated on the multiple health risks of obesity as well as the benefit of weight loss to improve her health. She was advised of the need for long term treatment and the importance of lifestyle modifications.  AGREE: Multiple dietary modification options and treatment options were discussed and  Devonda agreed to keep a food journal with 1500 calories and 90 grams of protein daily We discussed the following Behavioral Modification Strategies today: increasing lean protein intake and planning for success

## 2017-02-15 ENCOUNTER — Encounter (INDEPENDENT_AMBULATORY_CARE_PROVIDER_SITE_OTHER): Payer: Self-pay | Admitting: Physician Assistant

## 2017-02-16 NOTE — Telephone Encounter (Signed)
Please call pt

## 2017-02-20 ENCOUNTER — Ambulatory Visit (INDEPENDENT_AMBULATORY_CARE_PROVIDER_SITE_OTHER): Payer: BLUE CROSS/BLUE SHIELD | Admitting: Physician Assistant

## 2017-02-20 VITALS — BP 134/72 | HR 87 | Temp 97.8°F | Ht 63.0 in | Wt 249.0 lb

## 2017-02-20 DIAGNOSIS — I10 Essential (primary) hypertension: Secondary | ICD-10-CM | POA: Diagnosis not present

## 2017-02-20 DIAGNOSIS — Z6841 Body Mass Index (BMI) 40.0 and over, adult: Secondary | ICD-10-CM

## 2017-02-21 NOTE — Progress Notes (Addendum)
Office: [phone]  /  Fax: [phone]   HPI:   Chief Complaint: OBESITY Sharon Rivers is here to discuss her progress with her obesity treatment plan. She is on the keep a food journal with 1500 calories and 90 grams of protein daily and is following her eating plan approximately 85 % of the time. She states she is walking (work related) for 90 minutes 7 times per week. Sharon Rivers continues to do well with weight loss. She is mindful of her eating and has been incorporating more variety with her meals. She would like more protein options with her meals.  Her weight is 249 lb (112.9 kg) today and has had a weight loss of 4 pounds over a period of 3 weeks since her last visit. She has lost 31 lbs since starting treatment with [name].  Hypertension Nikkie Liming is a 47 y.o. female with hypertension. Fatiha's blood pressure is stable. She denies chest pain or shortness of breath. She is working weight loss to help control her blood pressure with the goal of decreasing her risk of heart attack and stroke. Atheena's blood pressure is currently controlled.  ALLERGIES: Allergies  Allergen Reactions  . Adhesive [Tape] Other (See Comments)    Skin Burn.  [name] [Menthol (Topical Analgesic)] Other (See Comments)    Skin Burn.  . Sulfa Antibiotics Rash    MEDICATIONS: Current Outpatient Medications on File Prior to Visit  Medication Sig Dispense Refill  . amLODipine (NORVASC) 10 MG tablet TAKE 1 TABLET(10 MG) BY MOUTH DAILY 30 tablet 4  . fluticasone (FLONASE) 50 MCG/ACT nasal spray Place 2 sprays into both nostrils daily. (Patient taking differently: Place 2 sprays daily into both nostrils. ) 16 g 0  . gabapentin (NEURONTIN) 300 MG capsule Take 300 mg 3 (three) times daily by mouth.     . levothyroxine (SYNTHROID, LEVOTHROID) 175 MCG tablet TAKE 1 TABLET(175 MCG) BY MOUTH DAILY BEFORE BREAKFAST 30 tablet 0  . lisinopril (PRINIVIL,ZESTRIL) 20 MG tablet TAKE 1 TABLET(20 MG) BY MOUTH DAILY 30 tablet 5   . metFORMIN (GLUCOPHAGE) 500 MG tablet Take 1 tablet (500 mg total) by mouth 2 (two) times daily with a meal. 60 tablet 0  . NORLYDA 0.35 MG tablet TAKE 1 TABLET(0.35 MG) BY MOUTH DAILY 28 tablet 11  . ondansetron (ZOFRAN) 4 MG tablet Take 1 tablet (4 mg total) by mouth every 8 (eight) hours as needed for nausea or vomiting. 20 tablet 0  . pantoprazole (PROTONIX) 40 MG tablet TAKE 1 TABLET(40 MG) BY MOUTH DAILY 30 tablet 5  . SUMAtriptan (IMITREX) 100 MG tablet Take 1 tablet earliest onset of migraine.  May repeat once in 2 hours if headache persists or recurs.  Do not exceed more than 2 tablets in 24 hours 10 tablet 2  . topiramate (TOPAMAX) 100 MG tablet Take 1 tablet (100 mg total) by mouth at bedtime. 30 tablet 0  . venlafaxine XR (EFFEXOR-XR) 37.5 MG 24 hr capsule TAKE 2 CAPSULES BY MOUTH DAILY 60 capsule 5  . Vitamin D, Ergocalciferol, (DRISDOL) 50000 units CAPS capsule Take 1 capsule (50,000 Units total) by mouth every 7 (seven) days. 4 capsule 0   No current facility-administered medications on file prior to visit.     PAST MEDICAL HISTORY: Past Medical History:  Diagnosis Date  . Anxiety   . Back pain   . Chest pain    a. 07/2015 Myoview: EF 71%, medium size, mild intensity, partially reversible septal defect with overlying breast attenuation -> likely  artifact. No significant reversible ischemia.  . Constipation   . Depression   . Edema    feet and legs  . Frequent headaches   . GERD (gastroesophageal reflux disease)   . Hypertension   . Hypothyroidism   . Joint pain   . Migraines   . OSA (obstructive sleep apnea) 03/17/2016  . Osteoarthritis   . Panic anxiety syndrome   . SOB (shortness of breath)    a. 04/2016 Echo: EF 60-65%, Gr1 DD.  [name] Swallowing difficulty     PAST SURGICAL HISTORY: Past Surgical History:  Procedure Laterality Date  . CESAREAN SECTION  2001 & 2002  . ELBOW SURGERY    . MINOR CARPAL TUNNEL     pinched nerve in elbow  . WISDOM TOOTH EXTRACTION       SOCIAL HISTORY: Social History   Tobacco Use  . Smoking status: Former Research scientist (life sciences)  . Smokeless tobacco: Never Used  . Tobacco comment: quit 1992  Substance Use Topics  . Alcohol use: No    Alcohol/week: 0.0 oz  . Drug use: No    FAMILY HISTORY: Family History  Problem Relation Age of Onset  . Hypertension Mother        Living  . Arthritis Mother   . Thyroid disease Mother   . Heart disease Mother        MI at age 41  . Heart attack Mother   . Migraines Mother   . Depression Mother   . Obesity Mother   . Hypertension Maternal Grandmother   . Parkinson's disease Maternal Grandmother   . Alzheimer's disease Maternal Grandmother   . Cancer Maternal Grandfather   . Hypertension Maternal Grandfather   . Hypertension Paternal Grandmother   . Hypertension Paternal Grandfather   . Gout Brother   . ADD / ADHD Son        x1  . Other Son        #2-Unknown  . Diabetes Neg Hx     ROS: Review of Systems  Constitutional: Positive for weight loss.  Respiratory: Negative for shortness of breath.   Cardiovascular: Negative for chest pain.    PHYSICAL EXAM: Blood pressure 134/72, pulse 87, temperature 97.8 F (36.6 C), temperature source Oral, height 5\' 3"  (1.6 m), weight 249 lb (112.9 kg), SpO2 98 %. Body mass index is 44.11 kg/m. Physical Exam  Constitutional: She is oriented to person, place, and time. She appears well-developed and well-nourished.  Cardiovascular: Normal rate.  Pulmonary/Chest: Effort normal.  Musculoskeletal: Normal range of motion.  Neurological: She is oriented to person, place, and time.  Skin: Skin is warm and dry.  Psychiatric: She has a normal mood and affect. Her behavior is normal.  Vitals reviewed.   RECENT LABS AND TESTS: BMET    Component Value Date/Time   NA 139 10/31/2016 0835   K 3.9 10/31/2016 0835   CL 109 (H) 10/31/2016 0835   CO2 19 (L) 10/31/2016 0835   GLUCOSE 86 10/31/2016 0835   GLUCOSE 77 06/27/2016 1629   BUN 13  10/31/2016 0835   CREATININE 0.67 10/31/2016 0835   CALCIUM 8.7 10/31/2016 0835   GFRNONAA 107 10/31/2016 0835   GFRAA 124 10/31/2016 0835   Lab Results  Component Value Date   HGBA1C 4.6 (L) 10/31/2016   HGBA1C 4.8 03/10/2016   HGBA1C 4.7 07/22/2015   Lab Results  Component Value Date   INSULIN 14.3 10/31/2016   INSULIN 37.1 (H) 03/10/2016   CBC    Component  Value Date/Time   WBC 7.1 06/27/2016 1629   RBC 4.27 06/27/2016 1629   HGB 12.4 06/27/2016 1629   HCT 36.4 06/27/2016 1629   PLT 356.0 06/27/2016 1629   MCV 85.1 06/27/2016 1629   MCH 28.9 03/22/2016 1828   MCHC 34.2 06/27/2016 1629   RDW 14.0 06/27/2016 1629   LYMPHSABS 1.7 06/27/2016 1629   MONOABS 0.6 06/27/2016 1629   EOSABS 0.2 06/27/2016 1629   BASOSABS 0.1 06/27/2016 1629   Iron/TIBC/Ferritin/ %Sat No results found for: IRON, TIBC, FERRITIN, IRONPCTSAT Lipid Panel     Component Value Date/Time   CHOL 123 03/10/2016 1051   TRIG 87 03/10/2016 1051   HDL 38 (L) 03/10/2016 1051   CHOLHDL 3 07/22/2015 0847   VLDL 11.2 07/22/2015 0847   LDLCALC 68 03/10/2016 1051   Hepatic Function Panel     Component Value Date/Time   PROT 6.8 10/31/2016 0835   ALBUMIN 4.1 10/31/2016 0835   AST 11 10/31/2016 0835   ALT 9 10/31/2016 0835   ALKPHOS 65 10/31/2016 0835   BILITOT 0.3 10/31/2016 0835      Component Value Date/Time   TSH 1.040 10/31/2016 0835   TSH 1.98 04/21/2016 0709   TSH 0.428 (L) 03/10/2016 1051    ASSESSMENT AND PLAN: Essential hypertension  Class 3 severe obesity with serious comorbidity and body mass index (BMI) of 40.0 to 44.9 in adult, unspecified obesity type ([facility])  PLAN:  Hypertension We discussed sodium restriction, working on healthy weight loss, and a regular exercise program as the means to achieve improved blood pressure control. Verlaine agreed with this plan and agreed to follow up as directed. We will continue to monitor her blood pressure as well as her progress with the above  lifestyle modifications. She will continue her medications as prescribed and will watch for signs of hypotension as she continues her lifestyle modifications. Alanea agrees to follow up with our clinic in 2 to 3 weeks.  We spent > than 50% of the 15 minute visit on the counseling as documented in the note.  Obesity Kindle is currently in the action stage of change. As such, her goal is to continue with weight loss efforts She has agreed to keep a food journal with 1500 calories and 90 grams of protein daily Chanteria has been instructed to work up to a goal of 150 minutes of combined cardio and strengthening exercise per week for weight loss and overall health benefits. We discussed the following Behavioral Modification Strategies today: increasing lean protein intake and planning for success   Codi has agreed to follow up with our clinic in 2 to 3 weeks. She was informed of the importance of frequent follow up visits to maximize her success with intensive lifestyle modifications for her multiple health conditions.    OBESITY BEHAVIORAL INTERVENTION VISIT  Today's visit was # 20 out of 22.  Starting weight: 280 lbs Starting date: 03/10/16 Today's weight : 249 lbs  Today's date: 02/20/2017 Total lbs lost to date: 65 (Patients must lose 7 lbs in the first 6 months to continue with counseling)   ASK: We discussed the diagnosis of obesity with [name] today and Ettie agreed to give [name] permission to discuss obesity behavioral modification therapy today.  ASSESS: Latana has the diagnosis of obesity and her BMI today is 44.12 Jaslynne is in the action stage of change   ADVISE: Carlisa was educated on the multiple health risks of obesity as well as the benefit of weight loss  to improve her health. She was advised of the need for long term treatment and the importance of lifestyle modifications.  AGREE: Multiple dietary modification options and treatment options were discussed and   Jeralyn agreed to the above obesity treatment plan.   Wilhemena Durie, am acting as transcriptionist for Lacy Duverney, [facility] have reviewed this note and agree with its contents  I have reviewed the above documentation for accuracy and completeness, and I agree with the above. -Dennard Nip, MD

## 2017-03-06 ENCOUNTER — Encounter: Payer: Self-pay | Admitting: Family

## 2017-03-06 ENCOUNTER — Ambulatory Visit: Payer: BLUE CROSS/BLUE SHIELD | Admitting: Family

## 2017-03-06 ENCOUNTER — Ambulatory Visit (HOSPITAL_BASED_OUTPATIENT_CLINIC_OR_DEPARTMENT_OTHER)
Admission: RE | Admit: 2017-03-06 | Discharge: 2017-03-06 | Disposition: A | Discharge status: Home / Self Care | Payer: BLUE CROSS/BLUE SHIELD | Source: Ambulatory Visit | Attending: Family | Admitting: Family

## 2017-03-06 VITALS — BP 122/78 | HR 71 | Temp 98.7°F | Resp 16 | Ht 63.0 in | Wt 256.0 lb

## 2017-03-06 DIAGNOSIS — M19071 Primary osteoarthritis, right ankle and foot: Secondary | ICD-10-CM | POA: Diagnosis not present

## 2017-03-06 DIAGNOSIS — M79671 Pain in right foot: Secondary | ICD-10-CM

## 2017-03-06 MED ORDER — MELOXICAM 7.5 MG PO TABS: 7.5000 mg | ORAL_TABLET | Freq: Every day | ORAL | 0 refills | Status: DC

## 2017-03-06 NOTE — Patient Instructions (Signed)
Please complete lab work prior to leaving, Complete x ray on the first floor. You may use meloxicam once daily for heel pain.   You will be contacted about your referral to the foot doctor.

## 2017-03-06 NOTE — Progress Notes (Signed)
Subjective:    Patient ID: Sharon Rivers, female    DOB: 26-Dec-1970, 47 y.o.   MRN: [id number]  HPI  Pt  Reports 10 day hx of right sided heel pain.  Last night the pain woke her up several times. Weight bearing makes her pain worse. Has tried tylenol/ibuprofen/aleve tens machine, ice all without improvement. Brother has gout, pt does not have gout.  She has a hx of heel spurs and has had   Working at [employer].  Stands on concrete x 8 hrs/day.  Review of Systems    see HPI  Past Medical History:  Diagnosis Date  . Anxiety   . Back pain   . Chest pain    a. 07/2015 Myoview: EF 71%, medium size, mild intensity, partially reversible septal defect with overlying breast attenuation -> likely artifact. No significant reversible ischemia.  . Constipation   . Depression   . Edema    feet and legs  . Frequent headaches   . GERD (gastroesophageal reflux disease)   . Hypertension   . Hypothyroidism   . Joint pain   . Migraines   . OSA (obstructive sleep apnea) 03/17/2016  . Osteoarthritis   . Panic anxiety syndrome   . SOB (shortness of breath)    a. 04/2016 Echo: EF 60-65%, Gr1 DD.  [name] Swallowing difficulty      Social History   Socioeconomic History  . Marital status: Single    Spouse name: Not on file  . Number of children: Not on file  . Years of education: Not on file  . Highest education level: Not on file  Social Needs  . Financial resource strain: Not on file  . Food insecurity - worry: Not on file  . Food insecurity - inability: Not on file  . Transportation needs - medical: Not on file  . Transportation needs - non-medical: Not on file  Occupational History  . Occupation: Assembler    Comment: [employer]  Tobacco Use  . Smoking status: Former Research scientist (life sciences)  . Smokeless tobacco: Never Used  . Tobacco comment: quit 1992  Substance and Sexual Activity  . Alcohol use: No    Alcohol/week: 0.0 oz  . Drug use: No  . Sexual activity: Yes   Partners: Female    Birth control/protection: Pill    Comment: OCP  Other Topics Concern  . Not on file  Social History Narrative   She has 2 children (grown). She did not retain custody of these children.   Lives with boyfriend in [place].   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     Past Surgical History:  Procedure Laterality Date  . CESAREAN SECTION  2001 & 2002  . ELBOW SURGERY    . MINOR CARPAL TUNNEL     pinched nerve in elbow  . WISDOM TOOTH EXTRACTION      Family History  Problem Relation Age of Onset  . Hypertension Mother        Living  . Arthritis Mother   . Thyroid disease Mother   . Heart disease Mother        MI at age 33  . Heart attack Mother   . Migraines Mother   . Depression Mother   . Obesity Mother   . Hypertension Maternal Grandmother   . Parkinson's disease Maternal Grandmother   . Alzheimer's disease Maternal Grandmother   . Cancer Maternal Grandfather   . Hypertension Maternal Grandfather   . Hypertension Paternal Grandmother   .  Hypertension Paternal Grandfather   . Gout Brother   . ADD / ADHD Son        x1  . Other Son        #2-Unknown  . Diabetes Neg Hx     Allergies  Allergen Reactions  . Adhesive [Tape] Other (See Comments)    Skin Burn.  [name] [Menthol (Topical Analgesic)] Other (See Comments)    Skin Burn.  . Sulfa Antibiotics Rash    Current Outpatient Medications on File Prior to Visit  Medication Sig Dispense Refill  . amLODipine (NORVASC) 10 MG tablet TAKE 1 TABLET(10 MG) BY MOUTH DAILY 30 tablet 4  . fluticasone (FLONASE) 50 MCG/ACT nasal spray Place 2 sprays into both nostrils daily. (Patient taking differently: Place 2 sprays daily into both nostrils. ) 16 g 0  . gabapentin (NEURONTIN) 300 MG capsule Take 300 mg 3 (three) times daily by mouth.     . levothyroxine (SYNTHROID, LEVOTHROID) 175 MCG tablet TAKE 1 TABLET(175 MCG) BY MOUTH DAILY BEFORE BREAKFAST 30 tablet 0  . lisinopril (PRINIVIL,ZESTRIL) 20  MG tablet TAKE 1 TABLET(20 MG) BY MOUTH DAILY 30 tablet 5  . metFORMIN (GLUCOPHAGE) 500 MG tablet Take 1 tablet (500 mg total) by mouth 2 (two) times daily with a meal. 60 tablet 0  . NORLYDA 0.35 MG tablet TAKE 1 TABLET(0.35 MG) BY MOUTH DAILY 28 tablet 11  . ondansetron (ZOFRAN) 4 MG tablet Take 1 tablet (4 mg total) by mouth every 8 (eight) hours as needed for nausea or vomiting. 20 tablet 0  . pantoprazole (PROTONIX) 40 MG tablet TAKE 1 TABLET(40 MG) BY MOUTH DAILY 30 tablet 5  . SUMAtriptan (IMITREX) 100 MG tablet Take 1 tablet earliest onset of migraine.  May repeat once in 2 hours if headache persists or recurs.  Do not exceed more than 2 tablets in 24 hours 10 tablet 2  . topiramate (TOPAMAX) 100 MG tablet Take 1 tablet (100 mg total) by mouth at bedtime. 30 tablet 0  . venlafaxine XR (EFFEXOR-XR) 37.5 MG 24 hr capsule TAKE 2 CAPSULES BY MOUTH DAILY 60 capsule 5  . Vitamin D, Ergocalciferol, (DRISDOL) 50000 units CAPS capsule Take 1 capsule (50,000 Units total) by mouth every 7 (seven) days. 4 capsule 0   No current facility-administered medications on file prior to visit.     BP 122/78 (BP Location: Right Arm, Patient Position: Sitting, Cuff Size: Large)   Pulse 71   Temp 98.7 F (37.1 C) (Oral)   Resp 16   Ht 5\' 3"  (1.6 m)   Wt 256 lb (116.1 kg)   SpO2 100%   BMI 45.35 kg/m    Objective:   Physical Exam  Constitutional: She is oriented to person, place, and time. She appears well-developed and well-nourished.  HENT:  Head: Normocephalic and atraumatic.  Musculoskeletal: She exhibits no edema.  + tenderness to palpation of right heel, no redness or swelling  Neurological: She is alert and oriented to person, place, and time.  Psychiatric: She has a normal mood and affect. Her behavior is normal. Judgment and thought content normal.          Assessment & Plan:  Heel pain- check uric acid level to evaluate for gout, check x ray, add meloxicam, advised pt to continue  icing. Will also refer to podiatry for further evaluation. Note provided for work.

## 2017-03-07 ENCOUNTER — Ambulatory Visit: Payer: BLUE CROSS/BLUE SHIELD

## 2017-03-07 ENCOUNTER — Encounter: Payer: Self-pay | Admitting: Family

## 2017-03-07 ENCOUNTER — Ambulatory Visit: Payer: BLUE CROSS/BLUE SHIELD | Admitting: Podiatry

## 2017-03-07 DIAGNOSIS — M8580 Other specified disorders of bone density and structure, unspecified site: Secondary | ICD-10-CM

## 2017-03-07 DIAGNOSIS — M722 Plantar fascial fibromatosis: Secondary | ICD-10-CM

## 2017-03-07 LAB — URIC ACID: URIC ACID, SERUM: 4.8 mg/dL (ref 2.4–7.0)

## 2017-03-07 MED ORDER — METHYLPREDNISOLONE 4 MG PO TBPK: ORAL_TABLET | ORAL | 0 refills | Status: DC

## 2017-03-07 MED ORDER — MELOXICAM 15 MG PO TABS: 15.0000 mg | ORAL_TABLET | Freq: Every day | ORAL | 3 refills | Status: DC

## 2017-03-07 NOTE — Patient Instructions (Signed)

## 2017-03-07 NOTE — Progress Notes (Signed)
Subjective:  Patient ID: Sharon Rivers, female    DOB: 12/01/1970,  MRN: [id number] HPI Chief Complaint  Patient presents with  . Foot Pain    R. mid arch and bottom heel pain x 1 week; 10/10 sharp pain Tx: icing, Ibuprofen and Gaba    47 y.o. female presents with the above complaint.     Past Medical History:  Diagnosis Date  . Anxiety   . Back pain   . Chest pain    a. 07/2015 Myoview: EF 71%, medium size, mild intensity, partially reversible septal defect with overlying breast attenuation -> likely artifact. No significant reversible ischemia.  . Constipation   . Depression   . Edema    feet and legs  . Frequent headaches   . GERD (gastroesophageal reflux disease)   . Hypertension   . Hypothyroidism   . Joint pain   . Migraines   . OSA (obstructive sleep apnea) 03/17/2016  . Osteoarthritis   . Panic anxiety syndrome   . SOB (shortness of breath)    a. 04/2016 Echo: EF 60-65%, Gr1 DD.  [name] Swallowing difficulty    Past Surgical History:  Procedure Laterality Date  . CESAREAN SECTION  2001 & 2002  . ELBOW SURGERY    . MINOR CARPAL TUNNEL     pinched nerve in elbow  . WISDOM TOOTH EXTRACTION      Current Outpatient Medications:  .  amLODipine (NORVASC) 10 MG tablet, TAKE 1 TABLET(10 MG) BY MOUTH DAILY, Disp: 30 tablet, Rfl: 4 .  fluticasone (FLONASE) 50 MCG/ACT nasal spray, Place 2 sprays into both nostrils daily. (Patient taking differently: Place 2 sprays daily into both nostrils. ), Disp: 16 g, Rfl: 0 .  gabapentin (NEURONTIN) 300 MG capsule, Take 300 mg 3 (three) times daily by mouth. , Disp: , Rfl:  .  levothyroxine (SYNTHROID, LEVOTHROID) 175 MCG tablet, TAKE 1 TABLET(175 MCG) BY MOUTH DAILY BEFORE BREAKFAST, Disp: 30 tablet, Rfl: 0 .  lisinopril (PRINIVIL,ZESTRIL) 20 MG tablet, TAKE 1 TABLET(20 MG) BY MOUTH DAILY, Disp: 30 tablet, Rfl: 5 .  meloxicam (MOBIC) 7.5 MG tablet, Take 1 tablet (7.5 mg total) by mouth daily., Disp: 14 tablet, Rfl: 0 .  metFORMIN  (GLUCOPHAGE) 500 MG tablet, Take 1 tablet (500 mg total) by mouth 2 (two) times daily with a meal., Disp: 60 tablet, Rfl: 0 .  NORLYDA 0.35 MG tablet, TAKE 1 TABLET(0.35 MG) BY MOUTH DAILY, Disp: 28 tablet, Rfl: 11 .  ondansetron (ZOFRAN) 4 MG tablet, Take 1 tablet (4 mg total) by mouth every 8 (eight) hours as needed for nausea or vomiting., Disp: 20 tablet, Rfl: 0 .  pantoprazole (PROTONIX) 40 MG tablet, TAKE 1 TABLET(40 MG) BY MOUTH DAILY, Disp: 30 tablet, Rfl: 5 .  SUMAtriptan (IMITREX) 100 MG tablet, Take 1 tablet earliest onset of migraine.  May repeat once in 2 hours if headache persists or recurs.  Do not exceed more than 2 tablets in 24 hours, Disp: 10 tablet, Rfl: 2 .  topiramate (TOPAMAX) 100 MG tablet, Take 1 tablet (100 mg total) by mouth at bedtime., Disp: 30 tablet, Rfl: 0 .  venlafaxine XR (EFFEXOR-XR) 37.5 MG 24 hr capsule, TAKE 2 CAPSULES BY MOUTH DAILY, Disp: 60 capsule, Rfl: 5 .  Vitamin D, Ergocalciferol, (DRISDOL) 50000 units CAPS capsule, Take 1 capsule (50,000 Units total) by mouth every 7 (seven) days., Disp: 4 capsule, Rfl: 0  Allergies  Allergen Reactions  . Adhesive [Tape] Other (See Comments)    Skin Burn.  [name]  Biofreeze [Menthol (Topical Analgesic)] Other (See Comments)    Skin Burn.  . Sulfa Antibiotics Rash   Review of Systems  All other systems reviewed and are negative.  Objective:  There were no vitals filed for this visit.  General: Well developed, nourished, in no acute distress, alert and oriented x3   Dermatological: Skin is warm, dry and supple bilateral. Nails x 10 are well maintained; remaining integument appears unremarkable at this time. There are no open sores, no preulcerative lesions, no rash or signs of infection present.  Vascular: Dorsalis Pedis artery and Posterior Tibial artery pedal pulses are 2/4 bilateral with immedate capillary fill time. Pedal hair growth present. No varicosities and no lower extremity edema present bilateral.    Neruologic: Grossly intact via light touch bilateral. Vibratory intact via tuning fork bilateral. Protective threshold with Semmes Wienstein monofilament intact to all pedal sites bilateral. Patellar and Achilles deep tendon reflexes 2+ bilateral. No Babinski or clonus noted bilateral.   Musculoskeletal: No gross boney pedal deformities bilateral. No pain, crepitus, or limitation noted with foot and ankle range of motion bilateral. Muscular strength 5/5 in all groups tested bilateral.  Pain on palpation medial calcaneal tubercle of the right foot.  Gait: Unassisted, Nonantalgic.    Radiographs:  Reviewed radiographs taken from urgent care yesterday demonstrating no acute osseous changes.  However there does appear to be a plantar distally oriented calcaneal heel spur with soft tissue increase in density surrounding the spur.  Often consistent with plantar fasciitis.  Assessment & Plan:   Assessment: Plantar fasciitis right heel.  Plan: We discussed etiology pathology conservative versus surgical therapies.  At this point after sterile Betadine skin prep and verbal consent I injected 20 mg of Kenalog and 5 mg Marcaine to the point of maximal tenderness medial aspect of the right heel.  She tolerated procedure well without complications.  Placed her in plantar fascial brace to be followed by a night splint.  Started on a Medrol Dosepak to be followed by meloxicam.  Provide her with both oral and written home-going instructions for the care and stretching of her plantar fascia.  She understands this and is amenable to it we will follow-up with me in 1 month.     Max T. [place], [place]

## 2017-03-08 ENCOUNTER — Telehealth: Payer: Self-pay | Admitting: Podiatry

## 2017-03-08 ENCOUNTER — Telehealth: Payer: Self-pay | Admitting: Family

## 2017-03-08 ENCOUNTER — Ambulatory Visit (INDEPENDENT_AMBULATORY_CARE_PROVIDER_SITE_OTHER): Payer: BLUE CROSS/BLUE SHIELD | Admitting: Physician Assistant

## 2017-03-08 VITALS — BP 150/80 | HR 73 | Temp 98.3°F | Ht 63.0 in | Wt 248.0 lb

## 2017-03-08 DIAGNOSIS — Z6841 Body Mass Index (BMI) 40.0 and over, adult: Secondary | ICD-10-CM | POA: Diagnosis not present

## 2017-03-08 DIAGNOSIS — F3289 Other specified depressive episodes: Secondary | ICD-10-CM

## 2017-03-08 DIAGNOSIS — E8881 Metabolic syndrome: Secondary | ICD-10-CM

## 2017-03-08 DIAGNOSIS — E038 Other specified hypothyroidism: Secondary | ICD-10-CM | POA: Insufficient documentation

## 2017-03-08 DIAGNOSIS — E559 Vitamin D deficiency, unspecified: Secondary | ICD-10-CM

## 2017-03-08 DIAGNOSIS — R5383 Other fatigue: Secondary | ICD-10-CM

## 2017-03-08 DIAGNOSIS — I10 Essential (primary) hypertension: Secondary | ICD-10-CM | POA: Diagnosis not present

## 2017-03-08 MED ORDER — TOPIRAMATE 50 MG PO TABS: 50.0000 mg | ORAL_TABLET | Freq: Every day | ORAL | 0 refills | Status: DC

## 2017-03-08 MED ORDER — METFORMIN HCL 500 MG PO TABS: 500.0000 mg | ORAL_TABLET | Freq: Two times a day (BID) | ORAL | 0 refills | Status: DC

## 2017-03-08 MED ORDER — VITAMIN D (ERGOCALCIFEROL) 1.25 MG (50000 UNIT) PO CAPS
50000.0000 [IU] | ORAL_CAPSULE | ORAL | 0 refills | Status: DC
Start: 2017-03-08 — End: 2017-08-09

## 2017-03-08 NOTE — Telephone Encounter (Signed)
Copied from [place]. Topic: General - Other >> Mar 08, 2017  3:54 PM Cecelia Byars, NT wrote: Reason for CRM: Patient went to the foot Dr yesterday and received a shot in the foot  it is still burning , she was taken out of work Mon - Wed  and she is still in pain and would like  if an extension for work until Monday, can be written for her, please advise [phone]

## 2017-03-08 NOTE — Telephone Encounter (Signed)
I'm a pt of Dr. Stephenie Acres and he is treating me for plantar fasciitis. When I saw him yesterday he gave me a brace to wear during the day and another one to wear at night as well as an injection. I'm supposed to return to work tomorrow however, today my foot is burning and I haven't been on my foot that much today. I just needed to talk to him about it. If you would please give me a call back at [phone]. Thank you.

## 2017-03-08 NOTE — Telephone Encounter (Signed)
Left message for pt to call again to discuss her plantar fasciitis.

## 2017-03-08 NOTE — Progress Notes (Signed)
Office: [phone]  /  Fax: [phone]   HPI:   Chief Complaint: OBESITY Sharon Rivers is here to discuss her progress with her obesity treatment plan. She is on the  keep a food journal with 1500 calories and 90 grams of protein daily  and is following her eating plan approximately 85 % of the time. She states she is walking 30 minutes 5 times per week. [place] continues to do well with weight loss. She is mindful of her eating. She has been prepping her meals well and packing her lunch to work, thus eating out less.  Her weight is 248 lb (112.5 kg) today and has had a weight loss of 1 pound over a period of 2 weeks since her last visit. She has lost 32 lbs since starting treatment with [name].  Hypertension Sharon Rivers is a 47 y.o. female with hypertension.  Sharon Rivers blood pressure is elevated, but she denies chest pain or shortness of breath on exertion. She is working weight loss to help control her blood pressure with the goal of decreasing her risk of heart attack and stroke. Sharon Rivers blood pressure is not currently controlled. Her blood pressure at home is stable, but she complains of right foot pain, and attributes high blood pressure to it.   Insulin Resistance Sharon Rivers has a diagnosis of insulin resistance based on her elevated fasting insulin level >5. Although Sharon Rivers's blood glucose readings are still under good control, insulin resistance puts her at greater risk of metabolic syndrome and diabetes. She is taking metformin currently and continues to work on diet and exercise to decrease risk of diabetes. Sharon Rivers denies polyphagia.   Hypothyroidism Sharon Rivers is a 47 y.o. female who presents for follow up of hypothyroidism. Current symptom: fatigue . Patient denies heat / cold intolerance and palpitations. She is taking synthroid currently.   Vitamin D deficiency Sharon Rivers has a diagnosis of vitamin D deficiency. She is currently taking vit D and denies nausea, vomiting or muscle  weakness.   Ref. Range 10/31/2016 08:35  Vitamin D, 25-Hydroxy Latest Ref Range: 30.0 - 100.0 ng/mL 33.7    Fatigue Sharon Rivers feels her energy is lower than it should be. This has worsened recently. She denies any changes in her sleep, weakness, chest pain, and dyspnea.   Depression with emotional eating behaviors Sharon Rivers's mood is stable but, is struggling with emotional eating and using food for comfort to the extent that it is negatively impacting her health. She often snacks when she is not hungry. Sharon Rivers sometimes feels she is out of control and then feels guilty that she made poor food choices. She has been working on behavior modification techniques to help reduce her emotional eating and has been somewhat successful. She shows no sign of suicidal or homicidal ideations.  Depression screen Sharon Rivers 2/9 03/10/2016 11/12/2015  Decreased Interest 3 0  Down, Depressed, Hopeless 3 1  PHQ - 2 Score 6 1  Altered sleeping 3 -  Tired, decreased energy 3 -  Change in appetite 3 -  Feeling bad or failure about yourself  3 -  Trouble concentrating 3 -  Moving slowly or fidgety/restless 2 -  PHQ-9 Score 23 -     ALLERGIES: Allergies  Allergen Reactions  . Adhesive [Tape] Other (See Comments)    Skin Burn.  Sharon Rivers [Menthol (Topical Analgesic)] Other (See Comments)    Skin Burn.  . Sulfa Antibiotics Rash    MEDICATIONS: Current Outpatient Medications on File Prior to Visit  Medication Sig  Dispense Refill  . amLODipine (NORVASC) 10 MG tablet TAKE 1 TABLET(10 MG) BY MOUTH DAILY 30 tablet 4  . fluticasone (FLONASE) 50 MCG/ACT nasal spray Place 2 sprays into both nostrils daily. (Patient taking differently: Place 2 sprays daily into both nostrils. ) 16 g 0  . gabapentin (NEURONTIN) 300 MG capsule Take 300 mg 3 (three) times daily by mouth.     . levothyroxine (SYNTHROID, LEVOTHROID) 175 MCG tablet TAKE 1 TABLET(175 MCG) BY MOUTH DAILY BEFORE BREAKFAST 30 tablet 0  . lisinopril  (PRINIVIL,ZESTRIL) 20 MG tablet TAKE 1 TABLET(20 MG) BY MOUTH DAILY 30 tablet 5  . meloxicam (MOBIC) 15 MG tablet Take 1 tablet (15 mg total) by mouth daily. 30 tablet 3  . metFORMIN (GLUCOPHAGE) 500 MG tablet Take 1 tablet (500 mg total) by mouth 2 (two) times daily with a meal. 60 tablet 0  . methylPREDNISolone (MEDROL DOSEPAK) 4 MG TBPK tablet 6 day dose pack - take as directed 21 tablet 0  . NORLYDA 0.35 MG tablet TAKE 1 TABLET(0.35 MG) BY MOUTH DAILY 28 tablet 11  . ondansetron (ZOFRAN) 4 MG tablet Take 1 tablet (4 mg total) by mouth every 8 (eight) hours as needed for nausea or vomiting. 20 tablet 0  . pantoprazole (PROTONIX) 40 MG tablet TAKE 1 TABLET(40 MG) BY MOUTH DAILY 30 tablet 5  . SUMAtriptan (IMITREX) 100 MG tablet Take 1 tablet earliest onset of migraine.  May repeat once in 2 hours if headache persists or recurs.  Do not exceed more than 2 tablets in 24 hours 10 tablet 2  . topiramate (TOPAMAX) 100 MG tablet Take 1 tablet (100 mg total) by mouth at bedtime. 30 tablet 0  . venlafaxine XR (EFFEXOR-XR) 37.5 MG 24 hr capsule TAKE 2 CAPSULES BY MOUTH DAILY 60 capsule 5  . Vitamin D, Ergocalciferol, (DRISDOL) 50000 units CAPS capsule Take 1 capsule (50,000 Units total) by mouth every 7 (seven) days. 4 capsule 0   No current facility-administered medications on file prior to visit.     PAST MEDICAL HISTORY: Past Medical History:  Diagnosis Date  . Anxiety   . Back pain   . Chest pain    a. 07/2015 Myoview: EF 71%, medium size, mild intensity, partially reversible septal defect with overlying breast attenuation -> likely artifact. No significant reversible ischemia.  . Constipation   . Depression   . Edema    feet and legs  . Frequent headaches   . GERD (gastroesophageal reflux disease)   . Hypertension   . Hypothyroidism   . Joint pain   . Migraines   . OSA (obstructive sleep apnea) 03/17/2016  . Osteoarthritis   . Panic anxiety syndrome   . SOB (shortness of breath)    a.  04/2016 Echo: EF 60-65%, Gr1 DD.  [name] Swallowing difficulty     PAST SURGICAL HISTORY: Past Surgical History:  Procedure Laterality Date  . CESAREAN SECTION  2001 & 2002  . ELBOW SURGERY    . MINOR CARPAL TUNNEL     pinched nerve in elbow  . WISDOM TOOTH EXTRACTION      SOCIAL HISTORY: Social History   Tobacco Use  . Smoking status: Former Research scientist (life sciences)  . Smokeless tobacco: Never Used  . Tobacco comment: quit 1992  Substance Use Topics  . Alcohol use: No    Alcohol/week: 0.0 oz  . Drug use: No    FAMILY HISTORY: Family History  Problem Relation Age of Onset  . Hypertension Mother  Living  . Arthritis Mother   . Thyroid disease Mother   . Heart disease Mother        MI at age 71  . Heart attack Mother   . Migraines Mother   . Depression Mother   . Obesity Mother   . Hypertension Maternal Grandmother   . Parkinson's disease Maternal Grandmother   . Alzheimer's disease Maternal Grandmother   . Cancer Maternal Grandfather   . Hypertension Maternal Grandfather   . Hypertension Paternal Grandmother   . Hypertension Paternal Grandfather   . Gout Brother   . ADD / ADHD Son        x1  . Other Son        #2-Unknown  . Diabetes Neg Hx     ROS: Review of Systems  Constitutional: Positive for malaise/fatigue and weight loss.  Respiratory: Negative for shortness of breath.   Cardiovascular: Negative for chest pain and palpitations.  Gastrointestinal: Negative for nausea and vomiting.  Musculoskeletal:       Positive right foot pain  Negative muscle weakness  Endo/Heme/Allergies:       Negative polyphagia  Negative heat/cold intolerance   Psychiatric/Behavioral: Positive for depression. Negative for suicidal ideas.    PHYSICAL EXAM: Blood pressure (!) 150/80, pulse 73, temperature 98.3 F (36.8 C), temperature source Oral, height 5\' 3"  (1.6 m), weight 248 lb (112.5 kg), last menstrual period 02/28/2017, SpO2 99 %. Body mass index is 43.93 kg/m. Physical Exam    Constitutional: She is oriented to person, place, and time. She appears well-developed and well-nourished.  Walking with limp dur to R foot pain  HENT:  Head: Normocephalic.  Eyes: EOM are normal.  Neck: Normal range of motion.  Cardiovascular: Normal rate.  Pulmonary/Chest: Effort normal.  Musculoskeletal: Normal range of motion.  Neurological: She is alert and oriented to person, place, and time.  Skin: Skin is warm and dry.  Psychiatric: She has a normal mood and affect. Her behavior is normal.  Vitals reviewed.   RECENT LABS AND TESTS: BMET    Component Value Date/Time   NA 139 10/31/2016 0835   K 3.9 10/31/2016 0835   CL 109 (H) 10/31/2016 0835   CO2 19 (L) 10/31/2016 0835   GLUCOSE 86 10/31/2016 0835   GLUCOSE 77 06/27/2016 1629   BUN 13 10/31/2016 0835   CREATININE 0.67 10/31/2016 0835   CALCIUM 8.7 10/31/2016 0835   GFRNONAA 107 10/31/2016 0835   GFRAA 124 10/31/2016 0835   Lab Results  Component Value Date   HGBA1C 4.6 (L) 10/31/2016   HGBA1C 4.8 03/10/2016   HGBA1C 4.7 07/22/2015   Lab Results  Component Value Date   INSULIN 14.3 10/31/2016   INSULIN 37.1 (H) 03/10/2016   CBC    Component Value Date/Time   WBC 7.1 06/27/2016 1629   RBC 4.27 06/27/2016 1629   HGB 12.4 06/27/2016 1629   HCT 36.4 06/27/2016 1629   PLT 356.0 06/27/2016 1629   MCV 85.1 06/27/2016 1629   MCH 28.9 03/22/2016 1828   MCHC 34.2 06/27/2016 1629   RDW 14.0 06/27/2016 1629   LYMPHSABS 1.7 06/27/2016 1629   MONOABS 0.6 06/27/2016 1629   EOSABS 0.2 06/27/2016 1629   BASOSABS 0.1 06/27/2016 1629   Iron/TIBC/Ferritin/ %Sat No results found for: IRON, TIBC, FERRITIN, IRONPCTSAT Lipid Panel     Component Value Date/Time   CHOL 123 03/10/2016 1051   TRIG 87 03/10/2016 1051   HDL 38 (L) 03/10/2016 1051   CHOLHDL 3 07/22/2015 0847  VLDL 11.2 07/22/2015 0847   LDLCALC 68 03/10/2016 1051   Hepatic Function Panel     Component Value Date/Time   PROT 6.8 10/31/2016 0835    ALBUMIN 4.1 10/31/2016 0835   AST 11 10/31/2016 0835   ALT 9 10/31/2016 0835   ALKPHOS 65 10/31/2016 0835   BILITOT 0.3 10/31/2016 0835      Component Value Date/Time   TSH 1.040 10/31/2016 0835   TSH 1.98 04/21/2016 0709   TSH 0.428 (L) 03/10/2016 1051     Ref. Range 10/31/2016 08:35  Vitamin D, 25-Hydroxy Latest Ref Range: 30.0 - 100.0 ng/mL 33.7   ASSESSMENT AND PLAN: Essential hypertension - Plan: Lipid Panel With LDL/HDL Ratio  Insulin resistance - Plan: Comprehensive metabolic panel, Hemoglobin A1c, Insulin, random, metFORMIN (GLUCOPHAGE) 500 MG tablet  Other specified hypothyroidism - Plan: T3, T4, free, TSH  Vitamin D deficiency - Plan: VITAMIN D 25 Hydroxy (Vit-D Deficiency, Fractures), Vitamin D, Ergocalciferol, (DRISDOL) 50000 units CAPS capsule  Other fatigue - Plan: CBC With Differential, Lipid Panel With LDL/HDL Ratio  Other depression - with emotional eating - Plan: topiramate (TOPAMAX) 100 MG tablet  Class 3 severe obesity with serious comorbidity and body mass index (BMI) of 40.0 to 44.9 in adult, unspecified obesity type (HCC)  PLAN: Hypertension We discussed sodium restriction, working on healthy weight loss, and a regular exercise program as the means to achieve improved blood pressure control. Sharon Rivers agreed with this plan and agreed to follow up as directed. We will continue to monitor her blood pressure as well as her progress with the above lifestyle modifications. She will continue her medications as prescribed and will watch for signs of hypotension as she continues her lifestyle modifications.  Insulin Resistance Sharon Rivers will continue to work on weight loss, exercise, and decreasing simple carbohydrates in her diet to help decrease the risk of diabetes. We dicussed metformin including benefits and risks. She was informed that eating too many simple carbohydrates or too many calories at one sitting increases the likelihood of GI side effects. Sharon Rivers will  continue metformin 500 mg BID #60 with no refills. Sharon Rivers agreed to follow up with [name] as directed to monitor her progress.  Hypothyroidism Sharon Rivers will continue current medication, and we will check labs today. She agrees to follow up with [name] as directed to monitor her progress.   Vitamin D Deficiency Sharon Rivers was informed that low vitamin D levels contributes to fatigue and are associated with obesity, breast, and colon cancer. She agrees to continue to take prescription Vit D @50 ,000 IU #4 with no refills every week and will follow up for routine testing of vitamin D, at least 2-3 times per year. She was informed of the risk of over-replacement of vitamin D and agrees to not increase her dose unless she discusses this with [name] first. She agrees to follow up in our clinic in 2 weeks.   Fatigue Sharon Rivers was informed that her fatigue may be related to obesity, depression or many other causes. Labs will be ordered, and in the meanwhile Sharon Rivers has agreed to work on diet, exercise and weight loss to help with fatigue. Proper sleep hygiene was discussed including the need for 7-8 hours of quality sleep each night.  Depression with Emotional Eating Behaviors We discussed behavior modification techniques today to help Sharon Rivers deal with her emotional eating and depression. She has agreed to continue topiramate 100 mg qd #30 with no refills, and agreed to follow up as directed.   Obesity Sharon Rivers  is currently in the action stage of change. As such, her goal is to continue with weight loss efforts She has agreed to keep a food journal with 1500 calories and 90 grams of protein daily Kymani has been instructed to work up to a goal of 150 minutes of combined cardio and strengthening exercise per week for weight loss and overall health benefits. We discussed the following Behavioral Modification Stratagies today: increasing lean protein intake and work on meal planning and easy cooking plans   Sharon Rivers has  agreed to follow up with our clinic in 2 weeks. She was informed of the importance of frequent follow up visits to maximize her success with intensive lifestyle modifications for her multiple health conditions.    Today's visit was # 21 out of 22.  Starting weight: 280lb  Starting date: 03/10/16 Today's weight : 248lb  Today's date: 03/08/2017 Total lbs lost to date: 64  (Patients must lose 7 lbs in the first 6 months to continue with counseling)   ASK: We discussed the diagnosis of obesity with Sharon Rivers today and Ladeana agreed to give [name] permission to discuss obesity behavioral modification therapy today.  ASSESS: Paije has the diagnosis of obesity and her BMI today is 43.93 Chiffon is in the action stage of change   ADVISE: Hortense was educated on the multiple health risks of obesity as well as the benefit of weight loss to improve her health. She was advised of the need for long term treatment and the importance of lifestyle modifications.  AGREE: Multiple dietary modification options and treatment options were discussed and  Adana agreed to the above obesity treatment plan.   Leary Roca, am acting as transcriptionist for [employer], PA-C I, Lacy Duverney [facility], have reviewed this note and agree with its content.

## 2017-03-09 ENCOUNTER — Encounter: Payer: Self-pay | Admitting: Family

## 2017-03-09 ENCOUNTER — Telehealth: Payer: Self-pay | Admitting: Podiatry

## 2017-03-09 LAB — COMPREHENSIVE METABOLIC PANEL
ALBUMIN: 3.7 g/dL (ref 3.5–5.5)
ALK PHOS: 51 IU/L (ref 39–117)
ALT: 14 IU/L (ref 0–32)
AST: 13 IU/L (ref 0–40)
Albumin/Globulin Ratio: 1.3 (ref 1.2–2.2)
BUN / CREAT RATIO: 25 — AB (ref 9–23)
BUN: 17 mg/dL (ref 6–24)
Bilirubin Total: 0.3 mg/dL (ref 0.0–1.2)
CO2: 21 mmol/L (ref 20–29)
CREATININE: 0.68 mg/dL (ref 0.57–1.00)
Calcium: 8.6 mg/dL — ABNORMAL LOW (ref 8.7–10.2)
Chloride: 107 mmol/L — ABNORMAL HIGH (ref 96–106)
GFR calc Af Amer: 122 mL/min/{1.73_m2} (ref >59)
GFR calc non Af Amer: 106 mL/min/{1.73_m2} (ref >59)
GLUCOSE: 85 mg/dL (ref 65–99)
Globulin, Total: 2.8 g/dL (ref 1.5–4.5)
Potassium: 4.5 mmol/L (ref 3.5–5.2)
Sodium: 138 mmol/L (ref 134–144)
TOTAL PROTEIN: 6.5 g/dL (ref 6.0–8.5)

## 2017-03-09 LAB — TSH: TSH: 1.16 u[IU]/mL (ref 0.450–4.500)

## 2017-03-09 LAB — CBC WITH DIFFERENTIAL
BASOS ABS: 0 10*3/uL (ref 0.0–0.2)
Basos: 0 %
EOS (ABSOLUTE): 0.2 10*3/uL (ref 0.0–0.4)
Eos: 3 %
HEMOGLOBIN: 12.5 g/dL (ref 11.1–15.9)
Hematocrit: 37 % (ref 34.0–46.6)
IMMATURE GRANS (ABS): 0 10*3/uL (ref 0.0–0.1)
IMMATURE GRANULOCYTES: 0 %
Lymphocytes Absolute: 1.1 10*3/uL (ref 0.7–3.1)
Lymphs: 21 %
MCH: 27.4 pg (ref 26.6–33.0)
MCHC: 33.8 g/dL (ref 31.5–35.7)
MCV: 81 fL (ref 79–97)
MONOCYTES: 8 %
Monocytes Absolute: 0.4 10*3/uL (ref 0.1–0.9)
Neutrophils Absolute: 3.7 10*3/uL (ref 1.4–7.0)
Neutrophils: 68 %
RBC: 4.56 x10E6/uL (ref 3.77–5.28)
RDW: 15 % (ref 12.3–15.4)
WBC: 5.4 10*3/uL (ref 3.4–10.8)

## 2017-03-09 LAB — LIPID PANEL WITH LDL/HDL RATIO
Cholesterol, Total: 158 mg/dL (ref 100–199)
HDL: 45 mg/dL (ref >39)
LDL CALC: 100 mg/dL — AB (ref 0–99)
LDL/HDL RATIO: 2.2 ratio (ref 0.0–3.2)
Triglycerides: 63 mg/dL (ref 0–149)
VLDL CHOLESTEROL CAL: 13 mg/dL (ref 5–40)

## 2017-03-09 LAB — T4, FREE: FREE T4: 1.31 ng/dL (ref 0.82–1.77)

## 2017-03-09 LAB — INSULIN, RANDOM: INSULIN: 13.6 u[IU]/mL (ref 2.6–24.9)

## 2017-03-09 LAB — T3: T3 TOTAL: 83 ng/dL (ref 71–180)

## 2017-03-09 LAB — VITAMIN D 25 HYDROXY (VIT D DEFICIENCY, FRACTURES): Vit D, 25-Hydroxy: 28.2 ng/mL — ABNORMAL LOW (ref 30.0–100.0)

## 2017-03-09 LAB — HEMOGLOBIN A1C
Est. average glucose Bld gHb Est-mCnc: 94 mg/dL
HEMOGLOBIN A1C: 4.9 % (ref 4.8–5.6)

## 2017-03-09 NOTE — Telephone Encounter (Signed)
I left message informing pt, that often plantar fasciitis is a long treatment process, to be certain she is taking the meloxicam as directed, wear the braces over sock, use night splint as directed and the athletic shoes for support, ice 3-4 times a day for 15-20 minutes protecting the feet from the ice with fabric, and if additional pain medication was needed regular strength OTC tylenol as directed on the package if she could tolerate, and to call again with concerns. I also told pt I left her a message yesterday.

## 2017-03-09 NOTE — Telephone Encounter (Signed)
Hi Valery, I just got your voicemail message. I was sitting right by the phone I do not know why it did not ring. I was just wondering if he could write me a note leaving me out of work until Monday. That way I can ice my foot as I'm doing everything else he told me to do. If you can give me a call back. If he will do the note through [place] I can print it out and take it to work. Please give me a call back at [phone]. Thank you.

## 2017-03-09 NOTE — Telephone Encounter (Signed)
I called yesterday and left a message regarding where I was seen the day before about my right foot. I received an injection and received two different braces. My foot is still burning as of today and it still hurts to put pressure on my foot. I'm needing to know if there is something more I can do. I've been wearing tennis shoes and doing everything the doctor said to do. If someone would please give me a call back. My number is [phone]. Thank you.

## 2017-03-09 NOTE — Telephone Encounter (Signed)
Copied from [place]. Topic: General - Other >> Mar 08, 2017  3:54 PM Cecelia Byars, NT wrote: Reason for CRM: Patient went to the foot Dr yesterday and received a shot in the foot  it is still burning , she was taken out of work Mon - Wed  and she is still in pain and would like  if an extension for work until Monday, can be written for her, please advise [phone]  >> Mar 09, 2017 12:46 PM Neva Seat wrote: Pt called back to check the status of her extended work release note. Please call pt back asap to let her know when she can pick it up.

## 2017-03-10 ENCOUNTER — Encounter: Payer: Self-pay | Admitting: *Deleted

## 2017-03-10 NOTE — Telephone Encounter (Signed)
See documentation from Podiatrist as well. It appears that he has given his ok and letter has been given per his direction. Sent mychart to pt.

## 2017-03-10 NOTE — Telephone Encounter (Signed)
Left message informing pt I would have the note ready for her to pick up in the [place] office today before 4:00pm or to be faxed, to call with fax number.

## 2017-03-10 NOTE — Telephone Encounter (Signed)
OK to provide not through Monday.

## 2017-03-10 NOTE — Telephone Encounter (Signed)
Also, see phone from 03/09/17.

## 2017-03-13 ENCOUNTER — Ambulatory Visit (INDEPENDENT_AMBULATORY_CARE_PROVIDER_SITE_OTHER): Payer: BLUE CROSS/BLUE SHIELD | Admitting: Physician Assistant

## 2017-03-14 ENCOUNTER — Encounter: Payer: Self-pay | Admitting: Family

## 2017-03-16 ENCOUNTER — Ambulatory Visit (HOSPITAL_BASED_OUTPATIENT_CLINIC_OR_DEPARTMENT_OTHER)
Admission: RE | Admit: 2017-03-16 | Discharge: 2017-03-16 | Disposition: A | Discharge status: Home / Self Care | Payer: BLUE CROSS/BLUE SHIELD | Source: Ambulatory Visit | Attending: Family | Admitting: Family

## 2017-03-16 DIAGNOSIS — M8580 Other specified disorders of bone density and structure, unspecified site: Secondary | ICD-10-CM | POA: Diagnosis not present

## 2017-03-22 ENCOUNTER — Ambulatory Visit (INDEPENDENT_AMBULATORY_CARE_PROVIDER_SITE_OTHER): Payer: BLUE CROSS/BLUE SHIELD | Admitting: Physician Assistant

## 2017-03-22 VITALS — BP 124/78 | HR 72 | Temp 98.3°F | Ht 63.0 in | Wt 244.0 lb

## 2017-03-22 DIAGNOSIS — E8881 Metabolic syndrome: Secondary | ICD-10-CM

## 2017-03-22 DIAGNOSIS — Z6841 Body Mass Index (BMI) 40.0 and over, adult: Secondary | ICD-10-CM | POA: Diagnosis not present

## 2017-03-22 NOTE — Progress Notes (Signed)
Office: [phone]  /  Fax: [phone]   HPI:   Chief Complaint: OBESITY [name] is here to discuss her progress with her obesity treatment plan. She is on the keep a food journal with 1500 calories and 90 grams of protein daily and is following her eating plan approximately 95 % of the time. She states she is walking 60 minutes 7 times per week. Sharon Rivers continues to do well with weight loss. She states she has been sick with some nausea, and has skipped some of her meals.  Her weight is 244 lb (110.7 kg) today and has had a weight loss of 4 pounds over a period of 2 weeks since her last visit. She has lost 36 lbs since starting treatment with [name].  Insulin Resistance Sharon Rivers has a diagnosis of insulin resistance based on her elevated fasting insulin level >5. Although Sharon Rivers's blood glucose readings are still under good control, insulin resistance puts her at greater risk of metabolic syndrome and diabetes. She states she has not been taking metformin, as she has had some nausea. Sharon Rivers continues to work on diet and exercise to decrease risk of diabetes. She denies polyphagia.  ALLERGIES: Allergies  Allergen Reactions  . Adhesive [Tape] Other (See Comments)    Skin Burn.  Sharon Rivers [Menthol (Topical Analgesic)] Other (See Comments)    Skin Burn.  . Sulfa Antibiotics Rash    MEDICATIONS: Current Outpatient Medications on File Prior to Visit  Medication Sig Dispense Refill  . amLODipine (NORVASC) 10 MG tablet TAKE 1 TABLET(10 MG) BY MOUTH DAILY 30 tablet 4  . fluticasone (FLONASE) 50 MCG/ACT nasal spray Place 2 sprays into both nostrils daily. (Patient taking differently: Place 2 sprays daily into both nostrils. ) 16 g 0  . gabapentin (NEURONTIN) 300 MG capsule Take 300 mg 3 (three) times daily by mouth.     . levothyroxine (SYNTHROID, LEVOTHROID) 175 MCG tablet TAKE 1 TABLET(175 MCG) BY MOUTH DAILY BEFORE BREAKFAST 30 tablet 0  . lisinopril (PRINIVIL,ZESTRIL) 20 MG tablet TAKE 1  TABLET(20 MG) BY MOUTH DAILY 30 tablet 5  . meloxicam (MOBIC) 15 MG tablet Take 1 tablet (15 mg total) by mouth daily. 30 tablet 3  . methylPREDNISolone (MEDROL DOSEPAK) 4 MG TBPK tablet 6 day dose pack - take as directed 21 tablet 0  . NORLYDA 0.35 MG tablet TAKE 1 TABLET(0.35 MG) BY MOUTH DAILY 28 tablet 11  . ondansetron (ZOFRAN) 4 MG tablet Take 1 tablet (4 mg total) by mouth every 8 (eight) hours as needed for nausea or vomiting. 20 tablet 0  . pantoprazole (PROTONIX) 40 MG tablet TAKE 1 TABLET(40 MG) BY MOUTH DAILY 30 tablet 5  . SUMAtriptan (IMITREX) 100 MG tablet Take 1 tablet earliest onset of migraine.  May repeat once in 2 hours if headache persists or recurs.  Do not exceed more than 2 tablets in 24 hours 10 tablet 2  . topiramate (TOPAMAX) 50 MG tablet Take 1 tablet (50 mg total) by mouth daily. 30 tablet 0  . venlafaxine XR (EFFEXOR-XR) 37.5 MG 24 hr capsule TAKE 2 CAPSULES BY MOUTH DAILY 60 capsule 5  . Vitamin D, Ergocalciferol, (DRISDOL) 50000 units CAPS capsule Take 1 capsule (50,000 Units total) by mouth every 7 (seven) days. 4 capsule 0   No current facility-administered medications on file prior to visit.     PAST MEDICAL HISTORY: Past Medical History:  Diagnosis Date  . Anxiety   . Back pain   . Chest pain  a. 07/2015 Myoview: EF 71%, medium size, mild intensity, partially reversible septal defect with overlying breast attenuation -> likely artifact. No significant reversible ischemia.  . Constipation   . Depression   . Edema    feet and legs  . Frequent headaches   . GERD (gastroesophageal reflux disease)   . Hypertension   . Hypothyroidism   . Joint pain   . Migraines   . OSA (obstructive sleep apnea) 03/17/2016  . Osteoarthritis   . Panic anxiety syndrome   . SOB (shortness of breath)    a. 04/2016 Echo: EF 60-65%, Gr1 DD.  [name] Swallowing difficulty     PAST SURGICAL HISTORY: Past Surgical History:  Procedure Laterality Date  . CESAREAN SECTION  2001 &  2002  . ELBOW SURGERY    . MINOR CARPAL TUNNEL     pinched nerve in elbow  . WISDOM TOOTH EXTRACTION      SOCIAL HISTORY: Social History   Tobacco Use  . Smoking status: Former Research scientist (life sciences)  . Smokeless tobacco: Never Used  . Tobacco comment: quit 1992  Substance Use Topics  . Alcohol use: No    Alcohol/week: 0.0 oz  . Drug use: No    FAMILY HISTORY: Family History  Problem Relation Age of Onset  . Hypertension Mother        Living  . Arthritis Mother   . Thyroid disease Mother   . Heart disease Mother        MI at age 50  . Heart attack Mother   . Migraines Mother   . Depression Mother   . Obesity Mother   . Hypertension Maternal Grandmother   . Parkinson's disease Maternal Grandmother   . Alzheimer's disease Maternal Grandmother   . Cancer Maternal Grandfather   . Hypertension Maternal Grandfather   . Hypertension Paternal Grandmother   . Hypertension Paternal Grandfather   . Gout Brother   . ADD / ADHD Son        x1  . Other Son        #2-Unknown  . Diabetes Neg Hx     ROS: Review of Systems  Constitutional: Positive for weight loss.  Gastrointestinal: Positive for nausea.  Endo/Heme/Allergies:       Negative for polyphagia    PHYSICAL EXAM: Blood pressure 124/78, pulse 72, temperature 98.3 F (36.8 C), temperature source Oral, height 5\' 3"  (1.6 m), weight 244 lb (110.7 kg), last menstrual period 02/28/2017, SpO2 98 %. Body mass index is 43.22 kg/m. Physical Exam  Constitutional: She is oriented to person, place, and time. She appears well-developed and well-nourished.  Cardiovascular: Normal rate.  Pulmonary/Chest: Effort normal.  Musculoskeletal: Normal range of motion.  Neurological: She is oriented to person, place, and time.  Skin: Skin is warm and dry.  Psychiatric: She has a normal mood and affect. Her behavior is normal.  Vitals reviewed.   RECENT LABS AND TESTS: BMET    Component Value Date/Time   NA 138 03/08/2017 1108   K 4.5  03/08/2017 1108   CL 107 (H) 03/08/2017 1108   CO2 21 03/08/2017 1108   GLUCOSE 85 03/08/2017 1108   GLUCOSE 77 06/27/2016 1629   BUN 17 03/08/2017 1108   CREATININE 0.68 03/08/2017 1108   CALCIUM 8.6 (L) 03/08/2017 1108   GFRNONAA 106 03/08/2017 1108   GFRAA 122 03/08/2017 1108   Lab Results  Component Value Date   HGBA1C 4.9 03/08/2017   HGBA1C 4.6 (L) 10/31/2016   HGBA1C 4.8 03/10/2016  HGBA1C 4.7 07/22/2015   Lab Results  Component Value Date   INSULIN 13.6 03/08/2017   INSULIN 14.3 10/31/2016   INSULIN 37.1 (H) 03/10/2016   CBC    Component Value Date/Time   WBC 5.4 03/08/2017 1108   WBC 7.1 06/27/2016 1629   RBC 4.56 03/08/2017 1108   RBC 4.27 06/27/2016 1629   HGB 12.5 03/08/2017 1108   HCT 37.0 03/08/2017 1108   PLT 356.0 06/27/2016 1629   MCV 81 03/08/2017 1108   MCH 27.4 03/08/2017 1108   MCH 28.9 03/22/2016 1828   MCHC 33.8 03/08/2017 1108   MCHC 34.2 06/27/2016 1629   RDW 15.0 03/08/2017 1108   LYMPHSABS 1.1 03/08/2017 1108   MONOABS 0.6 06/27/2016 1629   EOSABS 0.2 03/08/2017 1108   BASOSABS 0.0 03/08/2017 1108   Iron/TIBC/Ferritin/ %Sat No results found for: IRON, TIBC, FERRITIN, IRONPCTSAT Lipid Panel     Component Value Date/Time   CHOL 158 03/08/2017 1108   TRIG 63 03/08/2017 1108   HDL 45 03/08/2017 1108   CHOLHDL 3 07/22/2015 0847   VLDL 11.2 07/22/2015 0847   LDLCALC 100 (H) 03/08/2017 1108   Hepatic Function Panel     Component Value Date/Time   PROT 6.5 03/08/2017 1108   ALBUMIN 3.7 03/08/2017 1108   AST 13 03/08/2017 1108   ALT 14 03/08/2017 1108   ALKPHOS 51 03/08/2017 1108   BILITOT 0.3 03/08/2017 1108      Component Value Date/Time   TSH 1.160 03/08/2017 1108   TSH 1.040 10/31/2016 0835   TSH 1.98 04/21/2016 0709    ASSESSMENT AND PLAN: Insulin resistance  Class 3 severe obesity with serious comorbidity and body mass index (BMI) of 40.0 to 44.9 in adult, unspecified obesity type (Sharon Rivers)  PLAN:  Insulin  Resistance Sharon Rivers will continue to work on weight loss, exercise, and decreasing simple carbohydrates in her diet to help decrease the risk of diabetes. We dicussed metformin including benefits and risks. She was informed that eating too many simple carbohydrates or too many calories at one sitting increases the likelihood of GI side effects. Sharon Rivers agreed to stop metformin and follow up with [name] as directed to monitor her progress.  We spent > than 50% of the 15 minute visit on the counseling as documented in the note.  Obesity  Sharon Rivers is currently in the action stage of change. As such, her goal is to continue with weight loss efforts She has agreed to follow the Category 3 plan Sharon Rivers has been instructed to work up to a goal of 150 minutes of combined cardio and strengthening exercise per week for weight loss and overall health benefits. We discussed the following Behavioral Modification Strategies today: increasing lean protein intake and work on meal planning and easy cooking plans  Sharon Rivers has agreed to follow up with our clinic in 2 weeks. She was informed of the importance of frequent follow up visits to maximize her success with intensive lifestyle modifications for her multiple health conditions.   OBESITY BEHAVIORAL INTERVENTION VISIT  Today's visit was # 22 out of 22.  Starting weight: 280 lbs Starting date: 03/10/16 Today's weight : 244 lbs Today's date: 03/22/2017 Total lbs lost to date: 39 (Patients must lose 7 lbs in the first 6 months to continue with counseling)   ASK: We discussed the diagnosis of obesity with Sharon Rivers today and Sharon Rivers agreed to give [name] permission to discuss obesity behavioral modification therapy today.  ASSESS: Sharon Rivers has the diagnosis of obesity and  her BMI today is 43.23 Sharon Rivers is in the action stage of change   ADVISE: Sharon Rivers was educated on the multiple health risks of obesity as well as the benefit of weight loss to improve her health. She  was advised of the need for long term treatment and the importance of lifestyle modifications.  AGREE: Multiple dietary modification options and treatment options were discussed and  Sharon Rivers agreed to the above obesity treatment plan.   Corey Skains, am acting as transcriptionist for [employer], PA-C I, Lacy Duverney [facility], have reviewed this note and agree with its content.

## 2017-04-03 ENCOUNTER — Other Ambulatory Visit: Payer: Self-pay | Admitting: Nurse Practitioner

## 2017-04-03 NOTE — Telephone Encounter (Signed)
REFILL 

## 2017-04-04 ENCOUNTER — Ambulatory Visit: Payer: BLUE CROSS/BLUE SHIELD | Admitting: Podiatry

## 2017-04-05 ENCOUNTER — Encounter (INDEPENDENT_AMBULATORY_CARE_PROVIDER_SITE_OTHER): Payer: Self-pay | Admitting: Physician Assistant

## 2017-04-06 ENCOUNTER — Ambulatory Visit (INDEPENDENT_AMBULATORY_CARE_PROVIDER_SITE_OTHER): Payer: BLUE CROSS/BLUE SHIELD | Admitting: Physician Assistant

## 2017-04-06 ENCOUNTER — Encounter (INDEPENDENT_AMBULATORY_CARE_PROVIDER_SITE_OTHER): Payer: Self-pay

## 2017-04-06 ENCOUNTER — Encounter: Payer: Self-pay | Admitting: Family

## 2017-04-07 ENCOUNTER — Ambulatory Visit: Payer: BLUE CROSS/BLUE SHIELD | Admitting: Family

## 2017-04-07 DIAGNOSIS — Z0289 Encounter for other administrative examinations: Secondary | ICD-10-CM

## 2017-04-11 ENCOUNTER — Other Ambulatory Visit: Payer: Self-pay | Admitting: Family

## 2017-04-11 ENCOUNTER — Encounter (INDEPENDENT_AMBULATORY_CARE_PROVIDER_SITE_OTHER): Payer: Self-pay | Admitting: Physician Assistant

## 2017-04-14 ENCOUNTER — Other Ambulatory Visit: Payer: Self-pay | Admitting: Family

## 2017-04-24 ENCOUNTER — Encounter: Payer: Self-pay | Admitting: Family

## 2017-04-24 ENCOUNTER — Ambulatory Visit: Payer: BLUE CROSS/BLUE SHIELD | Admitting: Family

## 2017-04-24 VITALS — BP 137/87 | HR 68 | Temp 99.0°F | Resp 16 | Ht 63.0 in | Wt 250.0 lb

## 2017-04-24 DIAGNOSIS — R42 Dizziness and giddiness: Secondary | ICD-10-CM

## 2017-04-24 DIAGNOSIS — H6503 Acute serous otitis media, bilateral: Secondary | ICD-10-CM

## 2017-04-24 MED ORDER — MECLIZINE HCL 25 MG PO TABS: 25.0000 mg | ORAL_TABLET | Freq: Three times a day (TID) | ORAL | 0 refills | Status: DC | PRN

## 2017-04-24 MED ORDER — LORATADINE 10 MG PO TABS: 10.0000 mg | ORAL_TABLET | Freq: Every day | ORAL | 11 refills | Status: DC

## 2017-04-24 NOTE — Patient Instructions (Addendum)
Add meclizine three times daily as needed for dizziness. Add claritin 10mg  once daily (for fluid in your ears). Add flonse 2 sprays each nostril once daily.  Call if new/worsening symptoms or if symptoms are not improved in 1-2 weeks.

## 2017-04-24 NOTE — Progress Notes (Signed)
Subjective:    Patient ID: Sharon Rivers, female    DOB: 1970-04-14, 47 y.o.   MRN: [id number]  HPI   Sharon Rivers is a 47 yr old female who presents today with chief complaint of dizziness. Symptoms have been present x 10 days.  No alleviating or worsening factors. She denies ear pain or nasal congestion.  Reports that her symptoms are intermittent.  Happened once when she stood up from the bed.  She reports that she became dizzy.  Fell back down on the bed.    Review of Systems See HPI  Past Medical History:  Diagnosis Date  . Anxiety   . Back pain   . Chest pain    a. 07/2015 Myoview: EF 71%, medium size, mild intensity, partially reversible septal defect with overlying breast attenuation -> likely artifact. No significant reversible ischemia.  . Constipation   . Depression   . Edema    feet and legs  . Frequent headaches   . GERD (gastroesophageal reflux disease)   . Hypertension   . Hypothyroidism   . Joint pain   . Migraines   . OSA (obstructive sleep apnea) 03/17/2016  . Osteoarthritis   . Panic anxiety syndrome   . SOB (shortness of breath)    a. 04/2016 Echo: EF 60-65%, Gr1 DD.  [name] Swallowing difficulty      Social History   Socioeconomic History  . Marital status: Single    Spouse name: Not on file  . Number of children: Not on file  . Years of education: Not on file  . Highest education level: Not on file  Social Needs  . Financial resource strain: Not on file  . Food insecurity - worry: Not on file  . Food insecurity - inability: Not on file  . Transportation needs - medical: Not on file  . Transportation needs - non-medical: Not on file  Occupational History  . Occupation: Assembler    Comment: [employer]  Tobacco Use  . Smoking status: Former Research scientist (life sciences)  . Smokeless tobacco: Never Used  . Tobacco comment: quit 1992  Substance and Sexual Activity  . Alcohol use: No    Alcohol/week: 0.0 oz  . Drug use: No  . Sexual activity: Yes    Partners: Female      Birth control/protection: Pill    Comment: OCP  Other Topics Concern  . Not on file  Social History Narrative   She has 2 children (grown). She did not retain custody of these children.   Lives with boyfriend in [place].   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     Past Surgical History:  Procedure Laterality Date  . CESAREAN SECTION  2001 & 2002  . ELBOW SURGERY    . MINOR CARPAL TUNNEL     pinched nerve in elbow  . WISDOM TOOTH EXTRACTION      Family History  Problem Relation Age of Onset  . Hypertension Mother        Living  . Arthritis Mother   . Thyroid disease Mother   . Heart disease Mother        MI at age 68  . Heart attack Mother   . Migraines Mother   . Depression Mother   . Obesity Mother   . Hypertension Maternal Grandmother   . Parkinson's disease Maternal Grandmother   . Alzheimer's disease Maternal Grandmother   . Cancer Maternal Grandfather   . Hypertension Maternal Grandfather   . Hypertension Paternal Grandmother   .  Hypertension Paternal Grandfather   . Gout Brother   . ADD / ADHD Son        x1  . Other Son        #2-Unknown  . Diabetes Neg Hx     Allergies  Allergen Reactions  . Adhesive [Tape] Other (See Comments)    Skin Burn.  [name] [Menthol (Topical Analgesic)] Other (See Comments)    Skin Burn.  . Sulfa Antibiotics Rash    Current Outpatient Medications on File Prior to Visit  Medication Sig Dispense Refill  . amLODipine (NORVASC) 10 MG tablet Take 1 tablet (10 mg total) by mouth daily. NEED OV. 30 tablet 0  . fluticasone (FLONASE) 50 MCG/ACT nasal spray SHAKE LIQUID AND USE 2 SPRAYS IN EACH NOSTRIL DAILY 16 g 3  . gabapentin (NEURONTIN) 300 MG capsule Take 300 mg 3 (three) times daily by mouth.     . levothyroxine (SYNTHROID, LEVOTHROID) 175 MCG tablet TAKE 1 TABLET BY MOUTH DAILY BEFORE BREAKFAST 30 tablet 5  . lisinopril (PRINIVIL,ZESTRIL) 20 MG tablet TAKE 1 TABLET(20 MG) BY MOUTH DAILY 30 tablet 5  .  meloxicam (MOBIC) 15 MG tablet Take 1 tablet (15 mg total) by mouth daily. 30 tablet 3  . methylPREDNISolone (MEDROL DOSEPAK) 4 MG TBPK tablet 6 day dose pack - take as directed 21 tablet 0  . NORLYDA 0.35 MG tablet TAKE 1 TABLET(0.35 MG) BY MOUTH DAILY 28 tablet 11  . ondansetron (ZOFRAN) 4 MG tablet Take 1 tablet (4 mg total) by mouth every 8 (eight) hours as needed for nausea or vomiting. 20 tablet 0  . pantoprazole (PROTONIX) 40 MG tablet TAKE 1 TABLET(40 MG) BY MOUTH DAILY 30 tablet 5  . SUMAtriptan (IMITREX) 100 MG tablet Take 1 tablet earliest onset of migraine.  May repeat once in 2 hours if headache persists or recurs.  Do not exceed more than 2 tablets in 24 hours 10 tablet 2  . topiramate (TOPAMAX) 50 MG tablet Take 1 tablet (50 mg total) by mouth daily. 30 tablet 0  . venlafaxine XR (EFFEXOR-XR) 37.5 MG 24 hr capsule TAKE 2 CAPSULES BY MOUTH DAILY 60 capsule 5  . Vitamin D, Ergocalciferol, (DRISDOL) 50000 units CAPS capsule Take 1 capsule (50,000 Units total) by mouth every 7 (seven) days. 4 capsule 0   No current facility-administered medications on file prior to visit.     BP 137/87 (BP Location: Right Arm, Patient Position: Sitting, Cuff Size: Large)   Pulse 68   Temp 99 F (37.2 C) (Oral)   Resp 16   Ht 5\' 3"  (1.6 m)   Wt 250 lb (113.4 kg)   LMP 04/03/2017   SpO2 100%   BMI 44.29 kg/m       Objective:   Physical Exam  Constitutional: She is oriented to person, place, and time. She appears well-developed and well-nourished.  HENT:  Head: Normocephalic and atraumatic.  Right Ear: Ear canal normal. Tympanic membrane is not injected and not erythematous.  Left Ear: Ear canal normal. Tympanic membrane is not injected and not erythematous.  Mouth/Throat: No oropharyngeal exudate, posterior oropharyngeal edema or posterior oropharyngeal erythema.  Clear fluid noted behind both tympanic membranes  Cardiovascular: Normal rate, regular rhythm and normal heart sounds.  No  murmur heard. Pulmonary/Chest: Effort normal and breath sounds normal. No respiratory distress. She has no wheezes.  Musculoskeletal: She exhibits no edema.  Neurological: She is alert and oriented to person, place, and time.  Psychiatric: She has a normal  mood and affect. Her behavior is normal. Judgment and thought content normal.          Assessment & Plan:  Vertigo/bilateral serous otitis mediaSymptoms are most consistent with vertigo.  She does also have bilateral serous effusions. Patient is advised as follows:  Add meclizine three times daily as needed for dizziness. Add claritin 10mg  once daily (for fluid in your ears). Add flonse 2 sprays each nostril once daily.  Call if new/worsening symptoms or if symptoms are not improved in 1-2 weeks.

## 2017-04-26 ENCOUNTER — Encounter: Payer: Self-pay | Admitting: Family

## 2017-05-02 ENCOUNTER — Ambulatory Visit: Payer: BLUE CROSS/BLUE SHIELD | Admitting: Podiatry

## 2017-05-06 ENCOUNTER — Other Ambulatory Visit: Payer: Self-pay | Admitting: Nurse Practitioner

## 2017-05-10 ENCOUNTER — Encounter (INDEPENDENT_AMBULATORY_CARE_PROVIDER_SITE_OTHER): Payer: Self-pay | Admitting: Physician Assistant

## 2017-05-11 NOTE — Telephone Encounter (Signed)
Please R/S

## 2017-05-30 ENCOUNTER — Emergency Department (HOSPITAL_BASED_OUTPATIENT_CLINIC_OR_DEPARTMENT_OTHER): Payer: BLUE CROSS/BLUE SHIELD

## 2017-05-30 ENCOUNTER — Emergency Department (HOSPITAL_BASED_OUTPATIENT_CLINIC_OR_DEPARTMENT_OTHER)
Admission: EM | Admit: 2017-05-30 | Discharge: 2017-05-30 | Disposition: A | Discharge status: Home / Self Care | Payer: BLUE CROSS/BLUE SHIELD | Attending: Emergency Medicine | Admitting: Emergency Medicine

## 2017-05-30 ENCOUNTER — Other Ambulatory Visit: Payer: Self-pay

## 2017-05-30 ENCOUNTER — Encounter (HOSPITAL_BASED_OUTPATIENT_CLINIC_OR_DEPARTMENT_OTHER): Payer: Self-pay | Admitting: *Deleted

## 2017-05-30 DIAGNOSIS — M25571 Pain in right ankle and joints of right foot: Secondary | ICD-10-CM

## 2017-05-30 DIAGNOSIS — F419 Anxiety disorder, unspecified: Secondary | ICD-10-CM | POA: Insufficient documentation

## 2017-05-30 DIAGNOSIS — E039 Hypothyroidism, unspecified: Secondary | ICD-10-CM | POA: Insufficient documentation

## 2017-05-30 DIAGNOSIS — I10 Essential (primary) hypertension: Secondary | ICD-10-CM | POA: Insufficient documentation

## 2017-05-30 DIAGNOSIS — Z79899 Other long term (current) drug therapy: Secondary | ICD-10-CM | POA: Insufficient documentation

## 2017-05-30 DIAGNOSIS — Z87891 Personal history of nicotine dependence: Secondary | ICD-10-CM | POA: Insufficient documentation

## 2017-05-30 DIAGNOSIS — I82441 Acute embolism and thrombosis of right tibial vein: Secondary | ICD-10-CM | POA: Insufficient documentation

## 2017-05-30 DIAGNOSIS — F329 Major depressive disorder, single episode, unspecified: Secondary | ICD-10-CM | POA: Insufficient documentation

## 2017-05-30 MED ORDER — RIVAROXABAN 15 MG PO TABS
15.0000 mg | ORAL_TABLET | Freq: Once | ORAL | Status: AC
  Administered 2017-05-30: 15 mg via ORAL
  Filled 2017-05-30: qty 1

## 2017-05-30 MED ORDER — RIVAROXABAN (XARELTO) VTE STARTER PACK (15 & 20 MG): ORAL_TABLET | ORAL | 0 refills | Status: DC

## 2017-05-30 MED ORDER — OXYCODONE-ACETAMINOPHEN 5-325 MG PO TABS: 1.0000 | ORAL_TABLET | ORAL | 0 refills | Status: DC | PRN

## 2017-05-30 MED ORDER — OXYCODONE-ACETAMINOPHEN 5-325 MG PO TABS
1.0000 | ORAL_TABLET | Freq: Once | ORAL | Status: AC
  Administered 2017-05-30: 1 via ORAL
  Filled 2017-05-30: qty 1

## 2017-05-30 MED ORDER — RIVAROXABAN (XARELTO) EDUCATION KIT FOR DVT/PE PATIENTS: PACK | Freq: Once | Status: DC

## 2017-05-30 NOTE — ED Triage Notes (Signed)
Right ankle has been painful, swollen and red since yesterday. No injury.

## 2017-05-30 NOTE — ED Notes (Signed)
Explained the xarelto education pack to pt. No questions at this time. Pt provided with information on how to use the free trial offer cards to have the medication filled. Made aware of when to return to the ED. Pt taken out in a wheelchair. No acute distress noted.

## 2017-05-30 NOTE — Discharge Instructions (Signed)
Your ultrasound revealed that you have a DVT causing her ankle redness and pain and swelling.  Please start taking the blood thinners as described and follow-up with your primary doctor in several days.  If you develop any new chest pain or shortness of breath, please return immediately to the nearest emergency department for further work-up and management.  Please use the pain medicine to help with your discomfort.

## 2017-05-30 NOTE — ED Notes (Signed)
ED Provider at bedside. 

## 2017-05-30 NOTE — ED Notes (Signed)
Family at bedside. 

## 2017-05-30 NOTE — ED Provider Notes (Signed)
[facility] EMERGENCY DEPARTMENT Provider Note   CSN: [id number] Arrival date & time: 05/30/17  1710     History   Chief Complaint Chief Complaint  Patient presents with  . Ankle Pain    HPI Sharon Rivers is a 47 y.o. female.  The history is provided by the patient and medical records.  Ankle Pain   The incident occurred 2 days ago. The incident occurred at home. There was no injury mechanism. The pain is present in the right ankle. The quality of the pain is described as sharp, aching and throbbing. The pain is at a severity of 5/10. The pain is moderate. The pain has been constant since onset. Pertinent negatives include no numbness, no loss of motion, no muscle weakness, no loss of sensation and no tingling. She reports no foreign bodies present. The symptoms are aggravated by bearing weight and palpation. She has tried ice and NSAIDs for the symptoms. The treatment provided no relief.    Past Medical History:  Diagnosis Date  . Anxiety   . Back pain   . Chest pain    a. 07/2015 Myoview: EF 71%, medium size, mild intensity, partially reversible septal defect with overlying breast attenuation -> likely artifact. No significant reversible ischemia.  . Constipation   . Depression   . Edema    feet and legs  . Frequent headaches   . GERD (gastroesophageal reflux disease)   . Hypertension   . Hypothyroidism   . Joint pain   . Migraines   . OSA (obstructive sleep apnea) 03/17/2016  . Osteoarthritis   . Panic anxiety syndrome   . SOB (shortness of breath)    a. 04/2016 Echo: EF 60-65%, Gr1 DD.  [name] Swallowing difficulty     Patient Active Problem List   Diagnosis Date Noted  . Essential hypertension 03/08/2017  . Other specified hypothyroidism 03/08/2017  . Other fatigue 03/08/2017  . Polyphagia 09/26/2016  . Class 3 obesity with serious comorbidity and body mass index (BMI) of 45.0 to 49.9 in adult 09/26/2016  . Low back pain 08/03/2016  . Insulin resistance  06/13/2016  . Vitamin D deficiency 04/12/2016  . OSA (obstructive sleep apnea) 03/17/2016  . Insomnia 02/23/2016  . GERD (gastroesophageal reflux disease) 02/23/2016  . Migraines 02/23/2016  . Depression 02/23/2016  . Hypertension 07/22/2015  . Lateral epicondylitis 07/22/2015  . Hypothyroid 07/22/2015  . Family history of early CAD 07/22/2015  . Carpal tunnel syndrome 04/30/2015  . Bilateral carpal tunnel syndrome 04/30/2015  . Headache 12/12/2013  . Heartburn 12/12/2013  . Morbid obesity ([place]) 09/12/2013  . Chondromalacia patellae 09/12/2013  . Fracture closed, calcaneus 06/13/2013  . Arthropathy 07/14/2012  . Cramp of limb 07/14/2012  . Diarrhea 07/14/2012  . Nausea with vomiting 07/14/2012  . Viral infection 07/14/2012    Past Surgical History:  Procedure Laterality Date  . CESAREAN SECTION  2001 & 2002  . ELBOW SURGERY    . MINOR CARPAL TUNNEL     pinched nerve in elbow  . WISDOM TOOTH EXTRACTION       OB History    Gravida  3   Para  2   Term  2   Preterm      AB  1   Living  2     SAB      TAB      Ectopic      Multiple      Live Births  Home Medications    Prior to Admission medications   Medication Sig Start Date End Date Taking? Authorizing Provider  amLODipine (NORVASC) 10 MG tablet Take 1 tablet (10 mg total) by mouth daily. PT OVERDUE FOR OV PLEASE CALL FOR APPT FUTURE REFILLS 05/08/17   Theora Gianotti, NP  fluticasone [facility]) 50 MCG/ACT nasal spray SHAKE LIQUID AND USE 2 SPRAYS IN EACH NOSTRIL DAILY 04/14/17   [name], NP  gabapentin (NEURONTIN) 300 MG capsule Take 300 mg 3 (three) times daily by mouth.     [provider]  levothyroxine (SYNTHROID, LEVOTHROID) 175 MCG tablet TAKE 1 TABLET BY MOUTH DAILY BEFORE BREAKFAST 04/11/17   [name], NP  lisinopril (PRINIVIL,ZESTRIL) 20 MG tablet TAKE 1 TABLET(20 MG) BY MOUTH DAILY 11/16/16   [name], NP  loratadine (CLARITIN)  10 MG tablet Take 1 tablet (10 mg total) by mouth daily. 04/24/17   [name], NP  meclizine (ANTIVERT) 25 MG tablet Take 1 tablet (25 mg total) by mouth 3 (three) times daily as needed for dizziness. 04/24/17   [name], NP  meloxicam (MOBIC) 15 MG tablet Take 1 tablet (15 mg total) by mouth daily. 03/07/17   Hyatt, Max T, DPM  methylPREDNISolone (MEDROL DOSEPAK) 4 MG TBPK tablet 6 day dose pack - take as directed 03/07/17   Hyatt, Max T, DPM  NORLYDA 0.35 MG tablet TAKE 1 TABLET(0.35 MG) BY MOUTH DAILY 08/11/16   [name], NP  ondansetron (ZOFRAN) 4 MG tablet Take 1 tablet (4 mg total) by mouth every 8 (eight) hours as needed for nausea or vomiting. 08/21/15   Brunetta Jeans, PA-C  pantoprazole (PROTONIX) 40 MG tablet TAKE 1 TABLET(40 MG) BY MOUTH DAILY 12/13/16   [name], NP  SUMAtriptan (IMITREX) 100 MG tablet Take 1 tablet earliest onset of migraine.  May repeat once in 2 hours if headache persists or recurs.  Do not exceed more than 2 tablets in 24 hours 05/16/16   Metta Clines R, DO  topiramate (TOPAMAX) 50 MG tablet Take 1 tablet (50 mg total) by mouth daily. 03/08/17   Waldon Merl, PA-C  venlafaxine XR (EFFEXOR-XR) 37.5 MG 24 hr capsule TAKE 2 CAPSULES BY MOUTH DAILY 12/12/16   [name], NP  Vitamin D, Ergocalciferol, (DRISDOL) 50000 units CAPS capsule Take 1 capsule (50,000 Units total) by mouth every 7 (seven) days. 03/08/17   Waldon Merl, PA-C    Family History Family History  Problem Relation Age of Onset  . Hypertension Mother        Living  . Arthritis Mother   . Thyroid disease Mother   . Heart disease Mother        MI at age 60  . Heart attack Mother   . Migraines Mother   . Depression Mother   . Obesity Mother   . Hypertension Maternal Grandmother   . Parkinson's disease Maternal Grandmother   . Alzheimer's disease Maternal Grandmother   . Cancer Maternal Grandfather   . Hypertension Maternal Grandfather   .  Hypertension Paternal Grandmother   . Hypertension Paternal Grandfather   . Gout Brother   . ADD / ADHD Son        x1  . Other Son        #2-Unknown  . Diabetes Neg Hx     Social History Social History   Tobacco Use  . Smoking status: Former Research scientist (life sciences)  . Smokeless tobacco: Never Used  . Tobacco comment: quit 1992  Substance  Use Topics  . Alcohol use: No    Alcohol/week: 0.0 oz  . Drug use: No     Allergies   Adhesive [tape]; Biofreeze [menthol (topical analgesic)]; and Sulfa antibiotics   Review of Systems Review of Systems  Constitutional: Negative for chills, diaphoresis, fatigue and fever.  HENT: Negative for congestion.   Respiratory: Negative for chest tightness and shortness of breath.   Cardiovascular: Negative for chest pain and palpitations.  Gastrointestinal: Negative for abdominal pain, constipation, diarrhea, nausea and vomiting.  Genitourinary: Negative for dysuria and flank pain.  Musculoskeletal: Negative for back pain, neck pain and neck stiffness.  Skin: Positive for rash. Negative for wound.  Neurological: Negative for tingling, light-headedness, numbness and headaches.  Psychiatric/Behavioral: Negative for agitation.     Physical Exam Updated Vital Signs BP (!) 151/91   Pulse 87   Temp 98.3 F (36.8 C) (Oral)   Resp 18   Ht _0  (1.575 m)   Wt 113.4 kg (250 lb)   SpO2 98%   BMI 45.73 kg/m   Physical Exam  Constitutional: She is oriented to person, place, and time. She appears well-developed and well-nourished. No distress.  HENT:  Head: Normocephalic and atraumatic.  Mouth/Throat: Oropharynx is clear and moist.  Eyes: Conjunctivae are normal. No scleral icterus.  Neck: Normal range of motion.  Cardiovascular: Normal rate and intact distal pulses.  No murmur heard. Pulmonary/Chest: Effort normal.  Musculoskeletal: She exhibits edema and tenderness.       Right ankle: She exhibits swelling. She exhibits normal range of motion, no  laceration and normal pulse. Tenderness. Medial malleolus tenderness found. No lateral malleolus tenderness found.       Feet:  Neurological: She is alert and oriented to person, place, and time. No sensory deficit. She exhibits normal muscle tone.  Skin: Skin is warm. Capillary refill takes less than 2 seconds. Rash noted. She is not diaphoretic. There is erythema.  Psychiatric: She has a normal mood and affect.  Nursing note and vitals reviewed.    ED Treatments / Results  Labs (all labs ordered are listed, but only abnormal results are displayed) Labs Reviewed - No data to display  EKG None  Radiology Dg Ankle Complete Right  Result Date: 05/30/2017 CLINICAL DATA:  Pain and swelling, no known injury, initial encounter EXAM: RIGHT ANKLE - COMPLETE 3+ VIEW COMPARISON:  None. FINDINGS: Considerable soft tissue swelling is noted particularly medially. No acute fracture or dislocation is noted. Calcaneal spurs are seen. IMPRESSION: Soft tissue swelling without acute bony abnormality. Electronically Signed   By: Inez Catalina M.D.   On: 05/30/2017 18:32   US Venous Img Lower Unilateral Right  Result Date: 05/30/2017 CLINICAL DATA:  Swelling and redness at the right ankle for 2 days EXAM: RIGHT LOWER EXTREMITY VENOUS DOPPLER ULTRASOUND TECHNIQUE: Gray-scale sonography with graded compression, as well as color Doppler and duplex ultrasound were performed to evaluate the lower extremity deep venous systems from the level of the common femoral vein and including the common femoral, femoral, profunda femoral, popliteal and calf veins including the posterior tibial, peroneal and gastrocnemius veins when visible. The superficial great saphenous vein was also interrogated. Spectral Doppler was utilized to evaluate flow at rest and with distal augmentation maneuvers in the common femoral, femoral and popliteal veins. COMPARISON:  None. FINDINGS: Contralateral Common Femoral Vein: Respiratory phasicity is  normal and symmetric with the symptomatic side. No evidence of thrombus. Normal compressibility. Common Femoral Vein: No evidence of thrombus. Normal compressibility, respiratory  phasicity and response to augmentation. Saphenofemoral Junction: No evidence of thrombus. Normal compressibility and flow on color Doppler imaging. Profunda Femoral Vein: No evidence of thrombus. Normal compressibility and flow on color Doppler imaging. Femoral Vein: No evidence of thrombus. Normal compressibility, respiratory phasicity and response to augmentation. Popliteal Vein: No evidence of thrombus. Normal compressibility, respiratory phasicity and response to augmentation. Calf Veins: Thrombus is noted within the posterior tibial vein with decreased compressibility. The peroneal vein is not well appreciated. Superficial Great Saphenous Vein: No evidence of thrombus. Normal compressibility. Venous Reflux:  None. Other Findings:  None. IMPRESSION: Calf deep vein thrombosis involving the posterior tibial vein Electronically Signed   By: Inez Catalina M.D.   On: 05/30/2017 19:23    Procedures Procedures (including critical care time)  Medications Ordered in ED Medications  rivaroxaban (XARELTO) Education Kit for DVT/PE patients (has no administration in time range)  Rivaroxaban (XARELTO) tablet 15 mg (has no administration in time range)  oxyCODONE-acetaminophen (PERCOCET/ROXICET) 5-325 MG per tablet 1 tablet (1 tablet Oral Given 05/30/17 1745)     Initial Impression / Assessment and Plan / ED Course  I have reviewed the triage vital signs and the nursing notes.  Pertinent labs & imaging results that were available during my care of the patient were reviewed by me and considered in my medical decision making (see chart for details).     Sharon Rivers is a 47 y.o. female with a past medical history significant for hypertension, hypothyroidism, GERD, vertigo, and headaches who presents with right lower leg pain.   Patient reports that for the last 2 days she has had medial right ankle and lower shin redness, pain, and asymmetric swelling.  She reports that she did not have an injury to her knowledge.  She denies any systemic signs of infection with no fevers, chills, chest pain, shortness of breath, nausea vomiting, conservation, diarrhea, dysuria.  She denies any knee pain or hip pain.  She reports no history of gout, DVT, or PE.  She does report that the redness is very tender on the medial side of her right lower leg.  She reports history of plantar fasciitis but says this feels slightly different.  She does report that it was painful when she tries to walk.  On exam, patient has tenderness and redness and warmth on the medial right leg from the proximal ankle up towards the mid shin.  Patient has equal DP and PT pulses bilaterally.  Normal sensation and strength in the ankle.  Patient only has medial tenderness and pain and does not have any lateral tenderness or pain even on the joint line. Exam otherwise unremarkable.  Patient will have a ultrasound to rule out DVT and an x-ray to look for subcutaneous air or other bony abnormality.  Based on exam and location of tenderness, suspect a cellulitis.  A joint infection was also considered with discussion with the patient.  The asymmetry of the discomfort and the lack of tenderness on the lateral joint side of the ankle makes intra-articular infection seem less likely.  Patient also had more tenderness with direct pressure on the leg versus manipulation of the ankle.  Anticipate reassessment after work-up.  Patient given pain medication during work-up.  7:57 PM X-ray returned no bony abnormality with soft tissue swelling.  Ultrasound showed evidence of DVT.  Patient will be started on Xarelto and will be given the education information.  Patient will be prescribed a 21-day supply of Xarelto and will follow-up with  her primary doctor this week for further  management.  Patient understood return precautions for any signs and symptoms of worsening DVT or pulmonary embolism.  Given patient's lack of respiratory or chest symptoms, do not feel she needs CT scan at this time.  Patient had blood work within the last few months showing normal kidney function.  Do not feel patient needs to  have blood work today.    Patient is agreeable to plan.  Patient will be given prescription for pain medication.  Patient no other questions or concerns and was discharged in good condition.  Final Clinical Impressions(s) / ED Diagnoses   Final diagnoses:  Acute deep vein thrombosis (DVT) of tibial vein of right lower extremity (HCC)  Acute right ankle pain    ED Discharge Orders        Ordered    oxyCODONE-acetaminophen (PERCOCET/ROXICET) 5-325 MG tablet  Every 4 hours PRN     05/30/17 1959    Rivaroxaban 15 & 20 MG TBPK     05/30/17 1959      Clinical Impression: 1. Acute deep vein thrombosis (DVT) of tibial vein of right lower extremity (HCC)   2. Acute right ankle pain     Disposition: Discharge  Condition: Good  I have discussed the results, Dx and Tx plan with the pt(& family if present). He/she/they expressed understanding and agree(s) with the plan. Discharge instructions discussed at great length. Strict return precautions discussed and pt &/or family have verbalized understanding of the instructions. No further questions at time of discharge.    New Prescriptions   OXYCODONE-ACETAMINOPHEN (PERCOCET/ROXICET) 5-325 MG TABLET    Take 1 tablet by mouth every 4 (four) hours as needed for severe pain.   RIVAROXABAN 15 & 20 MG TBPK    Take as directed on package: Start with one 27m tablet by mouth twice a day with food. On Day 22, switch to one 228mtablet once a day with food.    Follow Up: O'Debbrah AlarNP [address] [place] [phone]   [facility] [phone]     Lynnda Wiersma, ChGwenyth AllegraMD 05/31/17 [phone]

## 2017-05-30 NOTE — ED Notes (Signed)
Patient transported to Xray/US

## 2017-05-31 ENCOUNTER — Other Ambulatory Visit: Payer: Self-pay

## 2017-05-31 ENCOUNTER — Telehealth: Payer: Self-pay | Admitting: Family

## 2017-05-31 MED FILL — XARELTO STARTER PACK: 15 & 20 | 30 days supply | Qty: 51 | Fill #0

## 2017-05-31 MED FILL — OXYCODONE-ACETAMINOPHEN 5-3: 5-325 | 3 days supply | Qty: 15 | Fill #0

## 2017-05-31 NOTE — Telephone Encounter (Signed)
Okay to give patient a 2-week supply of Xarelto 20 mg samples 1 tablet p.o. once daily.  She will need follow-up as soon as her insurance becomes active.

## 2017-05-31 NOTE — Telephone Encounter (Signed)
Called pt and attempted to make an ed follow up visit. However, she said she is having insurance issues and won't have coverage until 07/08/17. Pt stated that the ed gave her a month month supply of Xarelto 15 mg 2x daily for 21 days and then 20 mg 1x per day. She offered to make an appt for 07/08/17 but she would need a partial refill before then.   Please advise  CB: [phone]

## 2017-05-31 NOTE — Telephone Encounter (Signed)
Please contact pt to arrange ED follow up appointment with me for DVT.

## 2017-05-31 NOTE — Telephone Encounter (Signed)
Patient advised ok per Lenna Sciara we will provide her 2 weeks worth of Xarelto 20 mg. She will have someone pick up for her tomorrow.

## 2017-06-01 ENCOUNTER — Encounter: Payer: Self-pay | Admitting: Family

## 2017-06-02 ENCOUNTER — Telehealth: Payer: Self-pay | Admitting: *Deleted

## 2017-06-02 ENCOUNTER — Encounter: Payer: Self-pay | Admitting: Family

## 2017-06-02 NOTE — Telephone Encounter (Signed)
Letter sent to pt. See 06/01/17 pt email.

## 2017-06-02 NOTE — Telephone Encounter (Signed)
Melissa-- please advise if ok to change date of letter to 06/03/17?

## 2017-06-03 ENCOUNTER — Other Ambulatory Visit: Payer: Self-pay | Admitting: Family

## 2017-06-19 ENCOUNTER — Encounter (INDEPENDENT_AMBULATORY_CARE_PROVIDER_SITE_OTHER): Payer: Self-pay | Admitting: Physician Assistant

## 2017-06-19 ENCOUNTER — Encounter: Payer: Self-pay | Admitting: Family

## 2017-06-19 NOTE — Telephone Encounter (Signed)
Please advise pt

## 2017-06-30 ENCOUNTER — Emergency Department (HOSPITAL_BASED_OUTPATIENT_CLINIC_OR_DEPARTMENT_OTHER)
Admission: EM | Admit: 2017-06-30 | Discharge: 2017-06-30 | Disposition: A | Discharge status: Home / Self Care | Payer: Self-pay | Attending: Emergency Medicine | Admitting: Emergency Medicine

## 2017-06-30 ENCOUNTER — Emergency Department (HOSPITAL_BASED_OUTPATIENT_CLINIC_OR_DEPARTMENT_OTHER): Payer: Self-pay

## 2017-06-30 ENCOUNTER — Other Ambulatory Visit: Payer: Self-pay

## 2017-06-30 ENCOUNTER — Encounter (HOSPITAL_BASED_OUTPATIENT_CLINIC_OR_DEPARTMENT_OTHER): Payer: Self-pay

## 2017-06-30 DIAGNOSIS — Z87891 Personal history of nicotine dependence: Secondary | ICD-10-CM | POA: Insufficient documentation

## 2017-06-30 DIAGNOSIS — E039 Hypothyroidism, unspecified: Secondary | ICD-10-CM | POA: Insufficient documentation

## 2017-06-30 DIAGNOSIS — I10 Essential (primary) hypertension: Secondary | ICD-10-CM | POA: Insufficient documentation

## 2017-06-30 DIAGNOSIS — Z79899 Other long term (current) drug therapy: Secondary | ICD-10-CM | POA: Insufficient documentation

## 2017-06-30 DIAGNOSIS — R0789 Other chest pain: Secondary | ICD-10-CM | POA: Insufficient documentation

## 2017-06-30 LAB — BASIC METABOLIC PANEL
ANION GAP: 7 (ref 5–15)
BUN: 16 mg/dL (ref 6–20)
CALCIUM: 8.5 mg/dL — AB (ref 8.9–10.3)
CO2: 21 mmol/L — ABNORMAL LOW (ref 22–32)
Chloride: 109 mmol/L (ref 101–111)
Creatinine, Ser: 0.76 mg/dL (ref 0.44–1.00)
GLUCOSE: 82 mg/dL (ref 65–99)
Potassium: 3.7 mmol/L (ref 3.5–5.1)
Sodium: 137 mmol/L (ref 135–145)

## 2017-06-30 LAB — CBC
HCT: 35.2 % — ABNORMAL LOW (ref 36.0–46.0)
HEMOGLOBIN: 11.9 g/dL — AB (ref 12.0–15.0)
MCH: 27.1 pg (ref 26.0–34.0)
MCHC: 33.8 g/dL (ref 30.0–36.0)
MCV: 80.2 fL (ref 78.0–100.0)
Platelets: 304 10*3/uL (ref 150–400)
RBC: 4.39 MIL/uL (ref 3.87–5.11)
RDW: 14.3 % (ref 11.5–15.5)
WBC: 7.6 10*3/uL (ref 4.0–10.5)

## 2017-06-30 LAB — PREGNANCY, URINE: Preg Test, Ur: NEGATIVE

## 2017-06-30 LAB — TROPONIN I: Troponin I: <0.03 ng/mL (ref <0.03)

## 2017-06-30 MED ORDER — PREDNISONE 20 MG PO TABS: 40.0000 mg | ORAL_TABLET | Freq: Every day | ORAL | 0 refills | Status: DC

## 2017-06-30 NOTE — ED Provider Notes (Signed)
[facility] Provider Note   CSN: [id number] Arrival date & time: 06/30/17  1624     History   Chief Complaint Chief Complaint  Patient presents with  . Chest Pain    HPI Kizzie Cotten is a 47 y.o. female.  HPI Presents with chest pain. Pain is in the left upper chest, sore, severe, nonradiating, worse with activity. No dyspnea. She acknowledges multiple medical issues including hypertension, states that she has heart disease as well. She also had a DVT diagnosed 1 month ago, and is taking Xarelto. No new syncope, fever, chills, vomiting, diarrhea, swelling anywhere. Past Medical History:  Diagnosis Date  . Anxiety   . Back pain   . Chest pain    a. 07/2015 Myoview: EF 71%, medium size, mild intensity, partially reversible septal defect with overlying breast attenuation -> likely artifact. No significant reversible ischemia.  . Constipation   . Depression   . Edema    feet and legs  . Frequent headaches   . GERD (gastroesophageal reflux disease)   . Hypertension   . Hypothyroidism   . Joint pain   . Migraines   . OSA (obstructive sleep apnea) 03/17/2016  . Osteoarthritis   . Panic anxiety syndrome   . SOB (shortness of breath)    a. 04/2016 Echo: EF 60-65%, Gr1 DD.  [name] Swallowing difficulty     Patient Active Problem List   Diagnosis Date Noted  . Essential hypertension 03/08/2017  . Other specified hypothyroidism 03/08/2017  . Other fatigue 03/08/2017  . Polyphagia 09/26/2016  . Class 3 obesity with serious comorbidity and body mass index (BMI) of 45.0 to 49.9 in adult 09/26/2016  . Low back pain 08/03/2016  . Insulin resistance 06/13/2016  . Vitamin D deficiency 04/12/2016  . OSA (obstructive sleep apnea) 03/17/2016  . Insomnia 02/23/2016  . GERD (gastroesophageal reflux disease) 02/23/2016  . Migraines 02/23/2016  . Depression 02/23/2016  . Hypertension 07/22/2015  . Lateral epicondylitis 07/22/2015  . Hypothyroid  07/22/2015  . Family history of early CAD 07/22/2015  . Carpal tunnel syndrome 04/30/2015  . Bilateral carpal tunnel syndrome 04/30/2015  . Headache 12/12/2013  . Heartburn 12/12/2013  . Morbid obesity ([place]) 09/12/2013  . Chondromalacia patellae 09/12/2013  . Fracture closed, calcaneus 06/13/2013  . Arthropathy 07/14/2012  . Cramp of limb 07/14/2012  . Diarrhea 07/14/2012  . Nausea with vomiting 07/14/2012  . Viral infection 07/14/2012    Past Surgical History:  Procedure Laterality Date  . CESAREAN SECTION  2001 & 2002  . ELBOW SURGERY    . MINOR CARPAL TUNNEL     pinched nerve in elbow  . WISDOM TOOTH EXTRACTION       OB History    Gravida  3   Para  2   Term  2   Preterm      AB  1   Living  2     SAB      TAB      Ectopic      Multiple      Live Births               Home Medications    Prior to Admission medications   Medication Sig Start Date End Date Taking? Authorizing Provider  amLODipine (NORVASC) 10 MG tablet Take 1 tablet (10 mg total) by mouth daily. PT OVERDUE FOR OV PLEASE CALL FOR APPT FUTURE REFILLS 05/08/17   Theora Gianotti, NP  fluticasone [facility]) 50 MCG/ACT nasal  spray SHAKE LIQUID AND USE 2 SPRAYS IN EACH NOSTRIL DAILY 04/14/17   [name], NP  gabapentin (NEURONTIN) 300 MG capsule Take 300 mg 3 (three) times daily by mouth.     [provider]  levothyroxine (SYNTHROID, LEVOTHROID) 175 MCG tablet TAKE 1 TABLET BY MOUTH DAILY BEFORE BREAKFAST 04/11/17   [name], NP  lisinopril (PRINIVIL,ZESTRIL) 20 MG tablet TAKE 1 TABLET(20 MG) BY MOUTH DAILY 11/16/16   [name], NP  loratadine (CLARITIN) 10 MG tablet Take 1 tablet (10 mg total) by mouth daily. 04/24/17   [name], NP  meclizine (ANTIVERT) 25 MG tablet Take 1 tablet (25 mg total) by mouth 3 (three) times daily as needed for dizziness. 04/24/17   [name], NP  meloxicam (MOBIC) 15 MG tablet Take 1 tablet (15 mg  total) by mouth daily. 03/07/17   Hyatt, Max T, DPM  methylPREDNISolone (MEDROL DOSEPAK) 4 MG TBPK tablet 6 day dose pack - take as directed 03/07/17   Hyatt, Max T, DPM  NORLYDA 0.35 MG tablet TAKE 1 TABLET(0.35 MG) BY MOUTH DAILY 08/11/16   [name], NP  ondansetron (ZOFRAN) 4 MG tablet Take 1 tablet (4 mg total) by mouth every 8 (eight) hours as needed for nausea or vomiting. 08/21/15   Brunetta Jeans, PA-C  oxyCODONE-acetaminophen (PERCOCET/ROXICET) 5-325 MG tablet Take 1 tablet by mouth every 4 (four) hours as needed for severe pain. 05/30/17   Tegeler, Gwenyth Allegra, MD  pantoprazole (PROTONIX) 40 MG tablet TAKE 1 TABLET(40 MG) BY MOUTH DAILY 06/05/17   [name], NP  Rivaroxaban 15 & 20 MG TBPK Take as directed on package: Start with one 15mg  tablet by mouth twice a day with food. On Day 22, switch to one 20mg  tablet once a day with food. 05/30/17   Tegeler, Gwenyth Allegra, MD  SUMAtriptan (IMITREX) 100 MG tablet Take 1 tablet earliest onset of migraine.  May repeat once in 2 hours if headache persists or recurs.  Do not exceed more than 2 tablets in 24 hours 05/16/16   Metta Clines R, DO  topiramate (TOPAMAX) 50 MG tablet Take 1 tablet (50 mg total) by mouth daily. 03/08/17   Waldon Merl, PA-C  venlafaxine XR (EFFEXOR-XR) 37.5 MG 24 hr capsule TAKE 2 CAPSULES BY MOUTH DAILY 12/12/16   [name], NP  Vitamin D, Ergocalciferol, (DRISDOL) 50000 units CAPS capsule Take 1 capsule (50,000 Units total) by mouth every 7 (seven) days. 03/08/17   Waldon Merl, PA-C    Family History Family History  Problem Relation Age of Onset  . Hypertension Mother        Living  . Arthritis Mother   . Thyroid disease Mother   . Heart disease Mother        MI at age 41  . Heart attack Mother   . Migraines Mother   . Depression Mother   . Obesity Mother   . Hypertension Maternal Grandmother   . Parkinson's disease Maternal Grandmother   . Alzheimer's disease Maternal Grandmother     . Cancer Maternal Grandfather   . Hypertension Maternal Grandfather   . Hypertension Paternal Grandmother   . Hypertension Paternal Grandfather   . Gout Brother   . ADD / ADHD Son        x1  . Other Son        #2-Unknown  . Diabetes Neg Hx     Social History Social History   Tobacco Use  . Smoking status: Former Research scientist (life sciences)  .  Smokeless tobacco: Never Used  . Tobacco comment: quit 1992  Substance Use Topics  . Alcohol use: No    Alcohol/week: 0.0 oz  . Drug use: No     Allergies   Adhesive [tape]; Biofreeze [menthol (topical analgesic)]; and Sulfa antibiotics   Review of Systems Review of Systems  Constitutional:       Per HPI, otherwise negative  HENT:       Per HPI, otherwise negative  Respiratory:       Per HPI, otherwise negative  Cardiovascular:       Per HPI, otherwise negative  Gastrointestinal: Negative for vomiting.  Endocrine:       Negative aside from HPI  Genitourinary:       Neg aside from HPI   Musculoskeletal:       Per HPI, otherwise negative  Skin: Negative.   Neurological: Negative for syncope.     Physical Exam Updated Vital Signs BP 119/65 (BP Location: Right Arm)   Pulse 79   Temp 98.5 F (36.9 C) (Oral)   Resp 20   Ht 5\' 2"  (1.575 m)   Wt 117 kg (258 lb)   LMP  (LMP Unknown)   SpO2 99%   BMI 47.19 kg/m   Physical Exam  Constitutional: She is oriented to person, place, and time. She appears well-developed and well-nourished. No distress.  HENT:  Head: Normocephalic and atraumatic.  Eyes: Conjunctivae and EOM are normal.  Cardiovascular: Normal rate and regular rhythm.  Pulmonary/Chest: Effort normal and breath sounds normal. No stridor. No respiratory distress.  Tenderness to palpation with guarding when pressures applied in the left upper chest  Abdominal: She exhibits no distension.  Musculoskeletal: She exhibits no edema.  Neurological: She is alert and oriented to person, place, and time. No cranial nerve deficit.   Skin: Skin is warm and dry.  Psychiatric: Her mood appears anxious.  Nursing note and vitals reviewed.    ED Treatments / Results  Labs (all labs ordered are listed, but only abnormal results are displayed) Labs Reviewed  BASIC METABOLIC PANEL - Abnormal; Notable for the following components:      Result Value   CO2 21 (*)    Calcium 8.5 (*)    All other components within normal limits  CBC - Abnormal; Notable for the following components:   Hemoglobin 11.9 (*)    HCT 35.2 (*)    All other components within normal limits  TROPONIN I  PREGNANCY, URINE    EKG EKG Interpretation  Date/Time:  Friday Jun 30 2017 16:42:28 EDT Ventricular Rate:  75 PR Interval:  164 QRS Duration: 74 QT Interval:  410 QTC Calculation: 457 R Axis:   52 Text Interpretation:  Normal sinus rhythm Normal ECG Normal ECG Confirmed by Carmin Muskrat [phone]) on 06/30/2017 5:00:14 PM   Radiology Dg Chest 2 View  Result Date: 06/30/2017 CLINICAL DATA:  47 y/o  F; chest pain. EXAM: CHEST - 2 VIEW COMPARISON:  03/22/2016 chest radiograph FINDINGS: Stable cardiomegaly given projection and technique. No consolidation, effusion, or pneumothorax. Pulmonary venous hypertension. No acute osseous abnormality identified. IMPRESSION: Cardiomegaly and pulmonary venous hypertension. No focal consolidation. Electronically Signed   By: Kristine Garbe M.D.   On: 06/30/2017 17:38    Procedures Procedures (including critical care time)   Initial Impression / Assessment and Plan / ED Course  I have reviewed the triage vital signs and the nursing notes.  Pertinent labs & imaging results that were available during my care of  the patient were reviewed by me and considered in my medical decision making (see chart for details).    7:19 PM On repeat exam the patient is awake alert, no distress, unremarkable findings discussed with her, and she now notes that she has been lifting boxes recently, this is likely  contributing to likely musculoskeletal pain given the patient's reproducible pain on exam, absence of other alarming findings. As the patient is also taking Xarelto appropriately, has no ischemic findings, there is low suspicion for atypical ACS. Patient discharged with prednisone therapy. Patient requested work note for duration of Memorial Day weekend, and this was provided.   Final Clinical Impressions(s) / ED Diagnoses  Atypical chest pain   Carmin Muskrat, MD 06/30/17 1919

## 2017-06-30 NOTE — ED Triage Notes (Signed)
C/o CP x today-NAD-steady gait

## 2017-06-30 NOTE — Discharge Instructions (Signed)
As discussed, today's evaluation has been reassuring. Please take medication as directed, and return here for concerning changes.

## 2017-07-03 ENCOUNTER — Encounter: Payer: Self-pay | Admitting: Family

## 2017-07-04 ENCOUNTER — Other Ambulatory Visit: Payer: Self-pay

## 2017-07-04 ENCOUNTER — Emergency Department (HOSPITAL_BASED_OUTPATIENT_CLINIC_OR_DEPARTMENT_OTHER)
Admission: EM | Admit: 2017-07-04 | Discharge: 2017-07-04 | Disposition: A | Discharge status: Home / Self Care | Payer: Self-pay | Attending: Emergency Medicine | Admitting: Emergency Medicine

## 2017-07-04 ENCOUNTER — Encounter (HOSPITAL_BASED_OUTPATIENT_CLINIC_OR_DEPARTMENT_OTHER): Payer: Self-pay | Admitting: Emergency Medicine

## 2017-07-04 DIAGNOSIS — E039 Hypothyroidism, unspecified: Secondary | ICD-10-CM | POA: Insufficient documentation

## 2017-07-04 DIAGNOSIS — I10 Essential (primary) hypertension: Secondary | ICD-10-CM | POA: Insufficient documentation

## 2017-07-04 DIAGNOSIS — Z7901 Long term (current) use of anticoagulants: Secondary | ICD-10-CM | POA: Insufficient documentation

## 2017-07-04 DIAGNOSIS — Z86718 Personal history of other venous thrombosis and embolism: Secondary | ICD-10-CM | POA: Insufficient documentation

## 2017-07-04 DIAGNOSIS — Z79899 Other long term (current) drug therapy: Secondary | ICD-10-CM | POA: Insufficient documentation

## 2017-07-04 DIAGNOSIS — Z87891 Personal history of nicotine dependence: Secondary | ICD-10-CM | POA: Insufficient documentation

## 2017-07-04 DIAGNOSIS — M67912 Unspecified disorder of synovium and tendon, left shoulder: Secondary | ICD-10-CM | POA: Insufficient documentation

## 2017-07-04 MED ORDER — HYDROCODONE-ACETAMINOPHEN 5-325 MG PO TABS: 1.0000 | ORAL_TABLET | Freq: Four times a day (QID) | ORAL | 0 refills | Status: DC | PRN

## 2017-07-04 NOTE — ED Provider Notes (Signed)
[facility] Provider Note: [name], MD, FACEP  CSN: [id number] MRN: [id number] ARRIVAL: 07/04/17 at [place]: [place]  Chest Pain   HISTORY OF PRESENT ILLNESS  07/04/17 5:28 AM Sharon Rivers is a 47 y.o. female with a history of deep vein thrombosis on Xarelto.  She is here with about a 4-day history of pain in her left shoulder radiating to the left upper quadrant of her chest.  Pain is worse with movement of her left shoulder, notably abduction and internal rotation.  She describes the pain is sharp and moderate to severe.  She lifts boxes routinely at work and thinks she may have overdone it.  There is no associated shortness of breath or numbness.  Pain is not worse with movement of the neck.  She was seen for this May 24 and placed on 40 mg of prednisone for 4 days.  She did not get significant relief with this.  Consultation with the [facility] state controlled substances database reveals the patient has received 1 prescription for oxycodone and 2 prescriptions for hydrocodone in the past 2 years..   Past Medical History:  Diagnosis Date  . Anxiety   . Back pain   . Chest pain    a. 07/2015 Myoview: EF 71%, medium size, mild intensity, partially reversible septal defect with overlying breast attenuation -> likely artifact. No significant reversible ischemia.  . Constipation   . Depression   . Edema    feet and legs  . Frequent headaches   . GERD (gastroesophageal reflux disease)   . Hypertension   . Hypothyroidism   . Joint pain   . Migraines   . OSA (obstructive sleep apnea) 03/17/2016  . Osteoarthritis   . Panic anxiety syndrome   . SOB (shortness of breath)    a. 04/2016 Echo: EF 60-65%, Gr1 DD.  [name] Swallowing difficulty     Past Surgical History:  Procedure Laterality Date  . CESAREAN SECTION  2001 & 2002  . ELBOW SURGERY    . MINOR CARPAL TUNNEL     pinched nerve in elbow  . WISDOM TOOTH EXTRACTION      Family  History  Problem Relation Age of Onset  . Hypertension Mother        Living  . Arthritis Mother   . Thyroid disease Mother   . Heart disease Mother        MI at age 30  . Heart attack Mother   . Migraines Mother   . Depression Mother   . Obesity Mother   . Hypertension Maternal Grandmother   . Parkinson's disease Maternal Grandmother   . Alzheimer's disease Maternal Grandmother   . Cancer Maternal Grandfather   . Hypertension Maternal Grandfather   . Hypertension Paternal Grandmother   . Hypertension Paternal Grandfather   . Gout Brother   . ADD / ADHD Son        x1  . Other Son        #2-Unknown  . Diabetes Neg Hx     Social History   Tobacco Use  . Smoking status: Former Research scientist (life sciences)  . Smokeless tobacco: Never Used  . Tobacco comment: quit 1992  Substance Use Topics  . Alcohol use: No    Alcohol/week: 0.0 oz  . Drug use: No    Prior to Admission medications   Medication Sig Start Date End Date Taking? Authorizing Provider  amLODipine (NORVASC) 10 MG tablet Take 1 tablet (10 mg  total) by mouth daily. PT OVERDUE FOR OV PLEASE CALL FOR APPT FUTURE REFILLS 05/08/17   Theora Gianotti, NP  fluticasone [facility]) 50 MCG/ACT nasal spray SHAKE LIQUID AND USE 2 SPRAYS IN EACH NOSTRIL DAILY 04/14/17   [name], NP  gabapentin (NEURONTIN) 300 MG capsule Take 300 mg 3 (three) times daily by mouth.     [provider]  levothyroxine (SYNTHROID, LEVOTHROID) 175 MCG tablet TAKE 1 TABLET BY MOUTH DAILY BEFORE BREAKFAST 04/11/17   [name], NP  lisinopril (PRINIVIL,ZESTRIL) 20 MG tablet TAKE 1 TABLET(20 MG) BY MOUTH DAILY 11/16/16   [name], NP  loratadine (CLARITIN) 10 MG tablet Take 1 tablet (10 mg total) by mouth daily. 04/24/17   [name], NP  meclizine (ANTIVERT) 25 MG tablet Take 1 tablet (25 mg total) by mouth 3 (three) times daily as needed for dizziness. 04/24/17   [name], NP  meloxicam (MOBIC) 15 MG tablet Take 1  tablet (15 mg total) by mouth daily. 03/07/17   Hyatt, Max T, DPM  methylPREDNISolone (MEDROL DOSEPAK) 4 MG TBPK tablet 6 day dose pack - take as directed 03/07/17   Hyatt, Max T, DPM  NORLYDA 0.35 MG tablet TAKE 1 TABLET(0.35 MG) BY MOUTH DAILY 08/11/16   [name], NP  ondansetron (ZOFRAN) 4 MG tablet Take 1 tablet (4 mg total) by mouth every 8 (eight) hours as needed for nausea or vomiting. 08/21/15   Brunetta Jeans, PA-C  oxyCODONE-acetaminophen (PERCOCET/ROXICET) 5-325 MG tablet Take 1 tablet by mouth every 4 (four) hours as needed for severe pain. 05/30/17   Tegeler, Gwenyth Allegra, MD  pantoprazole (PROTONIX) 40 MG tablet TAKE 1 TABLET(40 MG) BY MOUTH DAILY 06/05/17   [name], NP  predniSONE (DELTASONE) 20 MG tablet Take 2 tablets (40 mg total) by mouth daily with breakfast. For the next four days 06/30/17   Carmin Muskrat, MD  Rivaroxaban 15 & 20 MG TBPK Take as directed on package: Start with one 15mg  tablet by mouth twice a day with food. On Day 22, switch to one 20mg  tablet once a day with food. 05/30/17   Tegeler, Gwenyth Allegra, MD  SUMAtriptan (IMITREX) 100 MG tablet Take 1 tablet earliest onset of migraine.  May repeat once in 2 hours if headache persists or recurs.  Do not exceed more than 2 tablets in 24 hours 05/16/16   Metta Clines R, DO  topiramate (TOPAMAX) 50 MG tablet Take 1 tablet (50 mg total) by mouth daily. 03/08/17   Waldon Merl, PA-C  venlafaxine XR (EFFEXOR-XR) 37.5 MG 24 hr capsule TAKE 2 CAPSULES BY MOUTH DAILY 12/12/16   [name], NP  Vitamin D, Ergocalciferol, (DRISDOL) 50000 units CAPS capsule Take 1 capsule (50,000 Units total) by mouth every 7 (seven) days. 03/08/17   Waldon Merl, PA-C    Allergies Adhesive [tape]; Biofreeze [menthol (topical analgesic)]; and Sulfa antibiotics   REVIEW OF SYSTEMS  Negative except as noted here or in the History of Present Illness.   PHYSICAL EXAMINATION  Initial Vital Signs Blood pressure (!)  160/108, pulse 75, temperature 98.8 F (37.1 C), temperature source Oral, resp. rate 18, height 5\' 2"  (1.575 m), weight 117 kg (258 lb), SpO2 99 %.  Examination General: Well-developed, well-nourished female in no acute distress; appearance consistent with age of record HENT: normocephalic; atraumatic Eyes: pupils equal, round and reactive to light; extraocular muscles intact Neck: supple; no change in pain with range of motion of neck Heart: regular rate and rhythm Lungs:  clear to auscultation bilaterally Abdomen: soft; nondistended; nontender; bowel sounds present Extremities: No deformity; pulses normal; tenderness of left shoulder with pain on abduction and internal rotation Neurologic: Awake, alert and oriented; motor function intact in all extremities and symmetric; no facial droop Skin: Warm and dry Psychiatric: Normal mood and affect   RESULTS  Summary of this visit's results, reviewed by myself:   EKG Interpretation  Date/Time:    Ventricular Rate:    PR Interval:    QRS Duration:   QT Interval:    QTC Calculation:   R Axis:     Text Interpretation:        Laboratory Studies: No results found for this or any previous visit (from the past 24 hour(s)). Imaging Studies: No results found.  ED COURSE and MDM  Nursing notes and initial vitals signs, including pulse oximetry, reviewed.  Vitals:   07/04/17 0526 07/04/17 0527  BP: (!) 160/108   Pulse: 75   Resp: 18   Temp: 98.8 F (37.1 C)   TempSrc: Oral   SpO2: 99%   Weight:  117 kg (258 lb)  Height:  5\' 2"  (1.575 m)   Patient's history and examination consistent with left rotator cuff syndrome.  She does not have an orthopedist and we will refer her.  We will avoid NSAIDs as she is on Xarelto and wish to avoid bleeding risk.  PROCEDURES    ED DIAGNOSES     ICD-10-CM   1. Rotator cuff disorder, left W38.937        Shanon Rosser, MD 07/04/17 [phone]

## 2017-07-04 NOTE — ED Triage Notes (Signed)
Pt /o upper left chest pain. Pt reports pain increases when she raises her left arm. Pt was seen x 1 week ago for same.

## 2017-07-05 NOTE — Telephone Encounter (Signed)
   [name], NP  Sent: Tue Jul 04, 2017 3:01 PM  To: Kenadee Gates, Consuello Bossier, CMA      Message   OK to write out today and tomorrow, return Friday.

## 2017-07-10 ENCOUNTER — Ambulatory Visit (INDEPENDENT_AMBULATORY_CARE_PROVIDER_SITE_OTHER): Payer: Self-pay | Admitting: Family

## 2017-07-10 ENCOUNTER — Encounter: Payer: Self-pay | Admitting: Family

## 2017-07-10 ENCOUNTER — Ambulatory Visit (HOSPITAL_BASED_OUTPATIENT_CLINIC_OR_DEPARTMENT_OTHER)
Admission: RE | Admit: 2017-07-10 | Discharge: 2017-07-10 | Disposition: A | Discharge status: Home / Self Care | Payer: Self-pay | Source: Ambulatory Visit | Attending: Family | Admitting: Family

## 2017-07-10 VITALS — BP 141/77 | HR 79 | Temp 98.3°F | Resp 16 | Ht 63.0 in | Wt 262.4 lb

## 2017-07-10 DIAGNOSIS — I824Z9 Acute embolism and thrombosis of unspecified deep veins of unspecified distal lower extremity: Secondary | ICD-10-CM

## 2017-07-10 DIAGNOSIS — N921 Excessive and frequent menstruation with irregular cycle: Secondary | ICD-10-CM

## 2017-07-10 DIAGNOSIS — R079 Chest pain, unspecified: Secondary | ICD-10-CM

## 2017-07-10 DIAGNOSIS — R599 Enlarged lymph nodes, unspecified: Secondary | ICD-10-CM | POA: Insufficient documentation

## 2017-07-10 DIAGNOSIS — K8020 Calculus of gallbladder without cholecystitis without obstruction: Secondary | ICD-10-CM | POA: Insufficient documentation

## 2017-07-10 DIAGNOSIS — R0789 Other chest pain: Secondary | ICD-10-CM

## 2017-07-10 DIAGNOSIS — Z0000 Encounter for general adult medical examination without abnormal findings: Secondary | ICD-10-CM

## 2017-07-10 DIAGNOSIS — R0602 Shortness of breath: Secondary | ICD-10-CM | POA: Insufficient documentation

## 2017-07-10 LAB — POCT URINE HCG BY VISUAL COLOR COMPARISON TESTS: Preg Test, Ur: NEGATIVE

## 2017-07-10 MED ORDER — RIVAROXABAN 20 MG PO TABS: 20.0000 mg | ORAL_TABLET | Freq: Every day | ORAL | 2 refills | Status: DC

## 2017-07-10 MED ORDER — IOPAMIDOL (ISOVUE-370) INJECTION 76%
100.0000 mL | Freq: Once | INTRAVENOUS | Status: AC | PRN
  Administered 2017-07-10: 100 mL via INTRAVENOUS

## 2017-07-10 MED ORDER — VENLAFAXINE HCL ER 150 MG PO TB24: 1.0000 | ORAL_TABLET | Freq: Every day | ORAL | 1 refills | Status: DC

## 2017-07-10 NOTE — Patient Instructions (Addendum)
Please stop in radiology on the first floor to schedule your mammogram when you leave. Complete CT scan on the first floor prior to leaving. Increase your effexor to 150mg  once daily. Schedule an appointment with a counselor. Restart xarelto.  Go to ER if severe/worsening chest pain/shortness of breath.

## 2017-07-10 NOTE — Progress Notes (Signed)
Subjective:    Patient ID: Sharon Rivers, female    DOB: September 11, 1970, 47 y.o.   MRN: [id number]  HPI  Pt is a 47 yr old female who presents today with c/o irregular/heavy menses. Notes worsening symptom since she started xarelto. She was placed on xarelto on for DVT by the ER physician. She returned to the ED on 5/24 due to atypical CP.    Still having CP. Has been out of xarelto for several days.  Reports "I can be fin one minute and then her chest "tightens up." has associate dizziness/sweatiness. Reports that CP is worst under stress.   Depression- denies SI, but notes some hopelessness.  Feels an enormous amount of pressure. Reports that her boyfriend has started having seizures again.  Review of Systems    see HPI  Past Medical History:  Diagnosis Date  . Anxiety   . Back pain   . Chest pain    a. 07/2015 Myoview: EF 71%, medium size, mild intensity, partially reversible septal defect with overlying breast attenuation -> likely artifact. No significant reversible ischemia.  . Constipation   . Depression   . Edema    feet and legs  . Frequent headaches   . GERD (gastroesophageal reflux disease)   . Hypertension   . Hypothyroidism   . Joint pain   . Migraines   . OSA (obstructive sleep apnea) 03/17/2016  . Osteoarthritis   . Panic anxiety syndrome   . SOB (shortness of breath)    a. 04/2016 Echo: EF 60-65%, Gr1 DD.  [name] Swallowing difficulty      Social History   Socioeconomic History  . Marital status: Single    Spouse name: Not on file  . Number of children: Not on file  . Years of education: Not on file  . Highest education level: Not on file  Occupational History  . Occupation: Loss adjuster, chartered    Comment: Copywriter, advertising  Social Needs  . Financial resource strain: Not on file  . Food insecurity:    Worry: Not on file    Inability: Not on file  . Transportation needs:    Medical: Not on file    Non-medical: Not on file  Tobacco Use  . Smoking status: Former Research scientist (life sciences)    . Smokeless tobacco: Never Used  . Tobacco comment: quit 1992  Substance and Sexual Activity  . Alcohol use: No    Alcohol/week: 0.0 oz  . Drug use: No  . Sexual activity: Yes    Partners: Female    Birth control/protection: Pill  Lifestyle  . Physical activity:    Days per week: Not on file    Minutes per session: Not on file  . Stress: Not on file  Relationships  . Social connections:    Talks on phone: Not on file    Gets together: Not on file    Attends religious service: Not on file    Active member of club or organization: Not on file    Attends meetings of clubs or organizations: Not on file    Relationship status: Not on file  . Intimate partner violence:    Fear of current or ex partner: Not on file    Emotionally abused: Not on file    Physically abused: Not on file    Forced sexual activity: Not on file  Other Topics Concern  . Not on file  Social History Narrative   She has 2 children (grown). She did not retain custody of  these children.   Lives with boyfriend in [place].   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     Past Surgical History:  Procedure Laterality Date  . CESAREAN SECTION  2001 & 2002  . ELBOW SURGERY    . MINOR CARPAL TUNNEL     pinched nerve in elbow  . WISDOM TOOTH EXTRACTION      Family History  Problem Relation Age of Onset  . Hypertension Mother        Living  . Arthritis Mother   . Thyroid disease Mother   . Heart disease Mother        MI at age 64  . Heart attack Mother   . Migraines Mother   . Depression Mother   . Obesity Mother   . Hypertension Maternal Grandmother   . Parkinson's disease Maternal Grandmother   . Alzheimer's disease Maternal Grandmother   . Cancer Maternal Grandfather   . Hypertension Maternal Grandfather   . Hypertension Paternal Grandmother   . Hypertension Paternal Grandfather   . Gout Brother   . ADD / ADHD Son        x1  . Other Son        #2-Unknown  . Diabetes Neg Hx      Allergies  Allergen Reactions  . Adhesive [Tape] Other (See Comments)    Skin Burn.  [name] [Menthol (Topical Analgesic)] Other (See Comments)    Skin Burn.  . Sulfa Antibiotics Rash    Current Outpatient Medications on File Prior to Visit  Medication Sig Dispense Refill  . amLODipine (NORVASC) 10 MG tablet Take 1 tablet (10 mg total) by mouth daily. PT OVERDUE FOR OV PLEASE CALL FOR APPT FUTURE REFILLS 15 tablet 0  . fluticasone (FLONASE) 50 MCG/ACT nasal spray SHAKE LIQUID AND USE 2 SPRAYS IN EACH NOSTRIL DAILY 16 g 3  . gabapentin (NEURONTIN) 300 MG capsule Take 300 mg 3 (three) times daily by mouth.     [name] HYDROcodone-acetaminophen (NORCO) 5-325 MG tablet Take 1 tablet by mouth every 6 (six) hours as needed (for pain). 20 tablet 0  . levothyroxine (SYNTHROID, LEVOTHROID) 175 MCG tablet TAKE 1 TABLET BY MOUTH DAILY BEFORE BREAKFAST 30 tablet 5  . lisinopril (PRINIVIL,ZESTRIL) 20 MG tablet TAKE 1 TABLET(20 MG) BY MOUTH DAILY 30 tablet 5  . loratadine (CLARITIN) 10 MG tablet Take 1 tablet (10 mg total) by mouth daily. 30 tablet 11  . meclizine (ANTIVERT) 25 MG tablet Take 1 tablet (25 mg total) by mouth 3 (three) times daily as needed for dizziness. 30 tablet 0  . NORLYDA 0.35 MG tablet TAKE 1 TABLET(0.35 MG) BY MOUTH DAILY 28 tablet 11  . ondansetron (ZOFRAN) 4 MG tablet Take 1 tablet (4 mg total) by mouth every 8 (eight) hours as needed for nausea or vomiting. 20 tablet 0  . pantoprazole (PROTONIX) 40 MG tablet TAKE 1 TABLET(40 MG) BY MOUTH DAILY 30 tablet 5  . SUMAtriptan (IMITREX) 100 MG tablet Take 1 tablet earliest onset of migraine.  May repeat once in 2 hours if headache persists or recurs.  Do not exceed more than 2 tablets in 24 hours 10 tablet 2  . topiramate (TOPAMAX) 50 MG tablet Take 1 tablet (50 mg total) by mouth daily. 30 tablet 0  . venlafaxine XR (EFFEXOR-XR) 37.5 MG 24 hr capsule TAKE 2 CAPSULES BY MOUTH DAILY 60 capsule 5  . Vitamin D, Ergocalciferol,  (DRISDOL) 50000 units CAPS capsule Take 1 capsule (50,000 Units total) by mouth every  7 (seven) days. 4 capsule 0   No current facility-administered medications on file prior to visit.     BP (!) 141/77 (BP Location: Right Arm, Cuff Size: Large)   Pulse 79   Temp 98.3 F (36.8 C) (Oral)   Resp 16   Ht 5\' 3"  (1.6 m)   Wt 262 lb 6.4 oz (119 kg)   LMP 07/05/2017   SpO2 100%   BMI 46.48 kg/m    Objective:   Physical Exam  Constitutional: She is oriented to person, place, and time. She appears well-developed and well-nourished.  Cardiovascular: Normal rate, regular rhythm and normal heart sounds.  No murmur heard. Pulmonary/Chest: Effort normal and breath sounds normal. No respiratory distress. She has no wheezes.  Musculoskeletal: She exhibits no edema.  Neurological: She is alert and oriented to person, place, and time.  Skin: Skin is warm and dry.  Psychiatric: Judgment and thought content normal.  Flat affect          Assessment & Plan:  SOB/Atypical CP- need to rule out PE in setting of recent DVT and being out of xarelto x 2 days. Obtain urine HCG to rule out pregnancy and obtain CTA. EKG tracing is personally reviewed.  EKG notes NSR.  No acute changes.   DVT- Restart xarelto.    Depression- scored 18 on PHQ-9. Denies SI. Will increase her effexor to 150mg . She was given information to call for counseling as well.   Mennorrhagia- on norlyda refer to GYN for further evaluation. Likely exacerbated by anticoagulation

## 2017-07-11 ENCOUNTER — Telehealth: Payer: Self-pay | Admitting: Family

## 2017-07-11 ENCOUNTER — Telehealth: Payer: Self-pay

## 2017-07-11 MED ORDER — LEVOTHYROXINE SODIUM 175 MCG PO TABS: ORAL_TABLET | ORAL | 2 refills | Status: DC

## 2017-07-11 NOTE — Telephone Encounter (Signed)
Copied from [place]. Topic: Quick Communication - Other Results >> Jul 11, 2017  1:48 PM Jiles Prows, [place] wrote: Called patient to inform them of CT scan negative results. When patient returns call, triage nurse may disclose results. Also review Doctor's advise with patient over the phone for f/up 6 months repeat ct scan and to schedule routine dental appointment.  No PEC available for results.

## 2017-07-11 NOTE — Telephone Encounter (Signed)
PA initiated via Covermymeds; KEY: [id number]. Received real-time PA approval.   [id number];RAXENM:[id number];Review Type:Prior Auth;Coverage Start Date:06/11/2017;Coverage End Date:07/11/2018

## 2017-07-11 NOTE — Telephone Encounter (Signed)
Refill sent to [facility]  

## 2017-07-11 NOTE — Telephone Encounter (Signed)
Copied from [place] [id number]. Topic: Quick Communication - Rx Refill/Question >> Jul 11, 2017  9:41 AM Boyd Kerbs wrote: Medication:  levothyroxine (SYNTHROID, LEVOTHROID) 175 MCG tablet Pt. Is wanting to switch this prescription to [facility]   Has the patient contacted their pharmacy? Yes.   (Agent: If no, request that the patient contact the pharmacy for the refill.) (Agent: If yes, when and what did the pharmacy advise?)  Preferred Pharmacy (with phone number or street name):  [facility] [address] [place], [place] - [address] [place] [address] Phone: [phone] Fax: [phone]    Agent: Please be advised that RX refills may take up to 3 business days. We ask that you follow-up with your pharmacy.

## 2017-07-12 ENCOUNTER — Telehealth: Payer: Self-pay | Admitting: Family

## 2017-07-12 ENCOUNTER — Encounter: Payer: Self-pay | Admitting: Family

## 2017-07-12 NOTE — Telephone Encounter (Signed)
Notified pharmacist ok to use [facility].

## 2017-07-12 NOTE — Telephone Encounter (Signed)
Copied from [place]. Topic: Quick Communication - Rx Refill/Question >> Jul 12, 2017  4:01 PM Cecelia Byars, NT wrote: Medication: levothyroxine (SYNTHROID, LEVOTHROID) 175 MCG tablet  the pharmacy called and said they do not carry that manufacture and would like to change it to [facility] instead , [place] the patient contacted their pharmacy? yes  (Agent: If no, request that the patient contact the pharmacy for the refill (Agent: If yes, when and what did the pharmacy advise?) [place], [place] [phone] (Phone) [phone] (Fax     Preferred Pharmacy (with phone number or street name):  Agent: Please be advised that RX refills may take up to 3 business days. We ask that you follow-up with your pharmacy.

## 2017-07-13 NOTE — Telephone Encounter (Signed)
Noted  

## 2017-07-17 ENCOUNTER — Telehealth: Payer: Self-pay | Admitting: *Deleted

## 2017-07-17 MED ORDER — AMLODIPINE BESYLATE 10 MG PO TABS: 10.0000 mg | ORAL_TABLET | Freq: Every day | ORAL | 2 refills | Status: DC

## 2017-07-17 NOTE — Telephone Encounter (Addendum)
Received fax from [facility] requesting refill of amlodipine.  I do not see that we have ever filled this for pt before.  Please advise?

## 2017-07-17 NOTE — Telephone Encounter (Signed)
Refill sent.

## 2017-07-18 NOTE — Telephone Encounter (Signed)
Called pt, she states she has already spoken with someone about this and nothing is needed at this time.

## 2017-07-28 ENCOUNTER — Telehealth: Payer: Self-pay | Admitting: *Deleted

## 2017-07-28 MED ORDER — VENLAFAXINE HCL ER 150 MG PO TB24: 1.0000 | ORAL_TABLET | Freq: Every day | ORAL | 1 refills | Status: DC

## 2017-07-28 MED ORDER — LEVOTHYROXINE SODIUM 175 MCG PO TABS: ORAL_TABLET | ORAL | 1 refills | Status: DC

## 2017-07-28 MED ORDER — PANTOPRAZOLE SODIUM 40 MG PO TBEC: DELAYED_RELEASE_TABLET | ORAL | 1 refills | Status: DC

## 2017-07-28 MED ORDER — AMLODIPINE BESYLATE 10 MG PO TABS: 10.0000 mg | ORAL_TABLET | Freq: Every day | ORAL | 1 refills | Status: DC

## 2017-07-28 MED ORDER — RIVAROXABAN 20 MG PO TABS: 20.0000 mg | ORAL_TABLET | Freq: Every day | ORAL | 0 refills | Status: DC

## 2017-07-28 NOTE — Telephone Encounter (Signed)
Received faxes from [facility] requesting refills of: amlodipine, venlafaxine, xarelto, levothyroxine, pantoprazole.

## 2017-07-31 ENCOUNTER — Encounter: Payer: Self-pay | Admitting: Family

## 2017-07-31 NOTE — Telephone Encounter (Signed)
I will call GYN tomorrow.

## 2017-08-01 ENCOUNTER — Encounter: Payer: Self-pay | Admitting: Family

## 2017-08-01 NOTE — Telephone Encounter (Signed)
Melissa -- Scheduled appt for pt to see Dr Nehemiah Settle on 08/24/17 at 8:30am as this is their first available opening. They have placed pt on their wait list. Is this ok or do you have other recommendations?

## 2017-08-01 NOTE — Telephone Encounter (Signed)
OK to provide excuse note for work today please.

## 2017-08-01 NOTE — Telephone Encounter (Signed)
Note completed and message sent to pt.

## 2017-08-02 ENCOUNTER — Encounter: Payer: Self-pay | Admitting: Family

## 2017-08-04 MED ORDER — AMLODIPINE BESYLATE 10 MG PO TABS: 10.0000 mg | ORAL_TABLET | Freq: Every day | ORAL | 1 refills | Status: DC

## 2017-08-04 MED ORDER — VENLAFAXINE HCL ER 150 MG PO TB24: 1.0000 | ORAL_TABLET | Freq: Every day | ORAL | 1 refills | Status: DC

## 2017-08-04 MED ORDER — RIVAROXABAN 20 MG PO TABS: 20.0000 mg | ORAL_TABLET | Freq: Every day | ORAL | 0 refills | Status: DC

## 2017-08-04 MED ORDER — PANTOPRAZOLE SODIUM 40 MG PO TBEC: DELAYED_RELEASE_TABLET | ORAL | 1 refills | Status: DC

## 2017-08-04 MED ORDER — LEVOTHYROXINE SODIUM 175 MCG PO TABS: ORAL_TABLET | ORAL | 1 refills | Status: DC

## 2017-08-04 NOTE — Addendum Note (Signed)
Addended by: Kelle Darting A on: 08/04/2017 01:04 PM   Modules accepted: Orders

## 2017-08-04 NOTE — Telephone Encounter (Signed)
Upon chart review, refills went to local [facility]. Re-sent rxs to [facility]. Mychart message sent to pt.

## 2017-08-04 NOTE — Telephone Encounter (Signed)
[facility] did not receive refills. Please advise

## 2017-08-07 ENCOUNTER — Ambulatory Visit (HOSPITAL_BASED_OUTPATIENT_CLINIC_OR_DEPARTMENT_OTHER)
Admission: RE | Admit: 2017-08-07 | Discharge: 2017-08-07 | Disposition: A | Discharge status: Home / Self Care | Payer: BLUE CROSS/BLUE SHIELD | Source: Ambulatory Visit | Attending: Family | Admitting: Family

## 2017-08-07 DIAGNOSIS — Z0000 Encounter for general adult medical examination without abnormal findings: Secondary | ICD-10-CM | POA: Insufficient documentation

## 2017-08-08 ENCOUNTER — Encounter (INDEPENDENT_AMBULATORY_CARE_PROVIDER_SITE_OTHER): Payer: Self-pay | Admitting: Physician Assistant

## 2017-08-09 ENCOUNTER — Encounter (INDEPENDENT_AMBULATORY_CARE_PROVIDER_SITE_OTHER): Payer: Self-pay

## 2017-08-09 ENCOUNTER — Telehealth: Payer: Self-pay | Admitting: Family

## 2017-08-09 ENCOUNTER — Ambulatory Visit (INDEPENDENT_AMBULATORY_CARE_PROVIDER_SITE_OTHER): Payer: BLUE CROSS/BLUE SHIELD | Admitting: Physician Assistant

## 2017-08-09 ENCOUNTER — Ambulatory Visit (INDEPENDENT_AMBULATORY_CARE_PROVIDER_SITE_OTHER): Payer: BLUE CROSS/BLUE SHIELD | Admitting: Family Medicine

## 2017-08-09 VITALS — BP 131/87 | HR 75 | Temp 98.1°F | Ht 63.0 in | Wt 255.0 lb

## 2017-08-09 DIAGNOSIS — Z9189 Other specified personal risk factors, not elsewhere classified: Secondary | ICD-10-CM | POA: Diagnosis not present

## 2017-08-09 DIAGNOSIS — E559 Vitamin D deficiency, unspecified: Secondary | ICD-10-CM | POA: Diagnosis not present

## 2017-08-09 DIAGNOSIS — Z6841 Body Mass Index (BMI) 40.0 and over, adult: Secondary | ICD-10-CM

## 2017-08-09 DIAGNOSIS — E8881 Metabolic syndrome: Secondary | ICD-10-CM

## 2017-08-09 MED ORDER — VITAMIN D (ERGOCALCIFEROL) 1.25 MG (50000 UNIT) PO CAPS: 50000.0000 [IU] | ORAL_CAPSULE | ORAL | 0 refills | Status: DC

## 2017-08-09 MED ORDER — METFORMIN HCL 500 MG PO TABS: 500.0000 mg | ORAL_TABLET | Freq: Every day | ORAL | 0 refills | Status: DC

## 2017-08-09 NOTE — Telephone Encounter (Signed)
Copied from [place] [id number]. Topic: Quick Communication - Rx Refill/Question >> Aug 09, 2017 12:18 PM Scherrie Gerlach wrote: Medication: Venlafaxine HCl 150 MG TB24  [facility] calling to ask if this med can be changed to capsules. If we can change, pt will have an annual savings of $1,658.44  Please call [phone]  Ref #  [id number]  Denton Lank the pharmacist tech states she will call back later today if you do have a chance to call . Please leave a confirmation if OK to switch. This will expedite the Rx

## 2017-08-14 ENCOUNTER — Ambulatory Visit: Payer: Self-pay | Admitting: Family

## 2017-08-14 NOTE — Progress Notes (Signed)
Office: [phone]  /  Fax: [phone]   HPI:   Chief Complaint: OBESITY Sharon Rivers is here to discuss her progress with her obesity treatment plan. She is on the Category 3 plan and is following her eating plan approximately 80 % of the time. She states she is walking 30 minutes 5 times per week. Sharon Rivers is not following the eating plan closely over the last few months. She reports trying to eat meat and veggies, but she is not eating often enough. Her weight is 255 lb (115.7 kg) today and has had a weight gain of 11 pounds over a period of 20 weeks since her last visit. She has lost 25 lbs since starting treatment with [name].  Vitamin D deficiency Sharon Rivers has a diagnosis of vitamin D deficiency. She is currently taking prescription vit D 50,000 IU weekly and denies nausea, vomiting or muscle weakness.  Insulin Resistance Sharon Rivers has a diagnosis of insulin resistance based on her elevated fasting insulin level >5. Although Sharon Rivers's blood glucose readings are still under good control, insulin resistance puts her at greater risk of metabolic syndrome and diabetes. She is not taking metformin recently due to insomnia issues. She reports good appetite control while taking it and wants to restart. She notes no hypoglycemia or nausea, vomiting or diarrhea when taking metformin previously. She continues to work on diet and exercise to decrease risk of diabetes.  At risk for diabetes Sharon Rivers is at higher than average risk for developing diabetes due to her obesity and insulin resistance. She currently denies polyuria or polydipsia.  ALLERGIES: Allergies  Allergen Reactions  . Adhesive [Tape] Other (See Comments)    Skin Burn.  Sharon Rivers [Menthol (Topical Analgesic)] Other (See Comments)    Skin Burn.  . Sulfa Antibiotics Rash    MEDICATIONS: Current Outpatient Medications on File Prior to Visit  Medication Sig Dispense Refill  . amLODipine (NORVASC) 10 MG tablet Take 1 tablet (10 mg total) by  mouth daily. 90 tablet 1  . fluticasone (FLONASE) 50 MCG/ACT nasal spray SHAKE LIQUID AND USE 2 SPRAYS IN EACH NOSTRIL DAILY 16 g 3  . gabapentin (NEURONTIN) 300 MG capsule Take 300 mg 3 (three) times daily by mouth.     [name] HYDROcodone-acetaminophen (NORCO) 5-325 MG tablet Take 1 tablet by mouth every 6 (six) hours as needed (for pain). 20 tablet 0  . levothyroxine (SYNTHROID, LEVOTHROID) 175 MCG tablet TAKE 1 TABLET BY MOUTH DAILY BEFORE BREAKFAST 90 tablet 1  . lisinopril (PRINIVIL,ZESTRIL) 20 MG tablet TAKE 1 TABLET(20 MG) BY MOUTH DAILY 30 tablet 5  . loratadine (CLARITIN) 10 MG tablet Take 1 tablet (10 mg total) by mouth daily. 30 tablet 11  . meclizine (ANTIVERT) 25 MG tablet Take 1 tablet (25 mg total) by mouth 3 (three) times daily as needed for dizziness. 30 tablet 0  . pantoprazole (PROTONIX) 40 MG tablet TAKE 1 TABLET(40 MG) BY MOUTH DAILY 90 tablet 1  . rivaroxaban (XARELTO) 20 MG TABS tablet Take 1 tablet (20 mg total) by mouth daily with supper. 90 tablet 0  . topiramate (TOPAMAX) 50 MG tablet Take 1 tablet (50 mg total) by mouth daily. 30 tablet 0  . Venlafaxine HCl 150 MG TB24 Take 1 tablet (150 mg total) by mouth daily. 90 each 1   No current facility-administered medications on file prior to visit.     PAST MEDICAL HISTORY: Past Medical History:  Diagnosis Date  . Anxiety   . Back pain   . Chest pain  a. 07/2015 Myoview: EF 71%, medium size, mild intensity, partially reversible septal defect with overlying breast attenuation -> likely artifact. No significant reversible ischemia.  . Constipation   . Depression   . Edema    feet and legs  . Frequent headaches   . GERD (gastroesophageal reflux disease)   . Hypertension   . Hypothyroidism   . Joint pain   . Migraines   . OSA (obstructive sleep apnea) 03/17/2016  . Osteoarthritis   . Panic anxiety syndrome   . SOB (shortness of breath)    a. 04/2016 Echo: EF 60-65%, Gr1 DD.  [name] Swallowing difficulty     PAST  SURGICAL HISTORY: Past Surgical History:  Procedure Laterality Date  . CESAREAN SECTION  2001 & 2002  . ELBOW SURGERY    . MINOR CARPAL TUNNEL     pinched nerve in elbow  . WISDOM TOOTH EXTRACTION      SOCIAL HISTORY: Social History   Tobacco Use  . Smoking status: Former Research scientist (life sciences)  . Smokeless tobacco: Never Used  . Tobacco comment: quit 1992  Substance Use Topics  . Alcohol use: No    Alcohol/week: 0.0 oz  . Drug use: No    FAMILY HISTORY: Family History  Problem Relation Age of Onset  . Hypertension Mother        Living  . Arthritis Mother   . Thyroid disease Mother   . Heart disease Mother        MI at age 27  . Heart attack Mother   . Migraines Mother   . Depression Mother   . Obesity Mother   . Hypertension Maternal Grandmother   . Parkinson's disease Maternal Grandmother   . Alzheimer's disease Maternal Grandmother   . Cancer Maternal Grandfather   . Hypertension Maternal Grandfather   . Hypertension Paternal Grandmother   . Hypertension Paternal Grandfather   . Gout Brother   . ADD / ADHD Son        x1  . Other Son        #2-Unknown  . Diabetes Neg Hx     ROS: Review of Systems  Constitutional: Negative for weight loss.  Gastrointestinal: Negative for diarrhea, nausea and vomiting.  Genitourinary: Negative for frequency.  Musculoskeletal:       Negative for muscle weakness  Endo/Heme/Allergies: Negative for polydipsia.       Negative for hypoglycemia   Psychiatric/Behavioral: The patient has insomnia.     PHYSICAL EXAM: Blood pressure 131/87, pulse 75, temperature 98.1 F (36.7 C), temperature source Oral, height 5\' 3"  (1.6 m), weight 255 lb (115.7 kg), last menstrual period 08/04/2017, SpO2 99 %. Body mass index is 45.17 kg/m. Physical Exam  Constitutional: She is oriented to person, place, and time. She appears well-developed and well-nourished.  HENT:  Head: Normocephalic and atraumatic.  Cardiovascular: Normal rate.  Pulmonary/Chest:  Effort normal.  Musculoskeletal: Normal range of motion.  Neurological: She is oriented to person, place, and time.  Skin: Skin is warm and dry.  Psychiatric: She has a normal mood and affect. Her behavior is normal.  Vitals reviewed.   RECENT LABS AND TESTS: BMET    Component Value Date/Time   NA 137 06/30/2017 1648   NA 138 03/08/2017 1108   K 3.7 06/30/2017 1648   CL 109 06/30/2017 1648   CO2 21 (L) 06/30/2017 1648   GLUCOSE 82 06/30/2017 1648   BUN 16 06/30/2017 1648   BUN 17 03/08/2017 1108   CREATININE 0.76 06/30/2017 1648  CALCIUM 8.5 (L) 06/30/2017 1648   GFRNONAA >60 06/30/2017 1648   GFRAA >60 06/30/2017 1648   Lab Results  Component Value Date   HGBA1C 4.9 03/08/2017   HGBA1C 4.6 (L) 10/31/2016   HGBA1C 4.8 03/10/2016   HGBA1C 4.7 07/22/2015   Lab Results  Component Value Date   INSULIN 13.6 03/08/2017   INSULIN 14.3 10/31/2016   INSULIN 37.1 (H) 03/10/2016   CBC    Component Value Date/Time   WBC 7.6 06/30/2017 1648   RBC 4.39 06/30/2017 1648   HGB 11.9 (L) 06/30/2017 1648   HGB 12.5 03/08/2017 1108   HCT 35.2 (L) 06/30/2017 1648   HCT 37.0 03/08/2017 1108   PLT 304 06/30/2017 1648   MCV 80.2 06/30/2017 1648   MCV 81 03/08/2017 1108   MCH 27.1 06/30/2017 1648   MCHC 33.8 06/30/2017 1648   RDW 14.3 06/30/2017 1648   RDW 15.0 03/08/2017 1108   LYMPHSABS 1.1 03/08/2017 1108   MONOABS 0.6 06/27/2016 1629   EOSABS 0.2 03/08/2017 1108   BASOSABS 0.0 03/08/2017 1108   Iron/TIBC/Ferritin/ %Sat No results found for: IRON, TIBC, FERRITIN, IRONPCTSAT Lipid Panel     Component Value Date/Time   CHOL 158 03/08/2017 1108   TRIG 63 03/08/2017 1108   HDL 45 03/08/2017 1108   CHOLHDL 3 07/22/2015 0847   VLDL 11.2 07/22/2015 0847   LDLCALC 100 (H) 03/08/2017 1108   Hepatic Function Panel     Component Value Date/Time   PROT 6.5 03/08/2017 1108   ALBUMIN 3.7 03/08/2017 1108   AST 13 03/08/2017 1108   ALT 14 03/08/2017 1108   ALKPHOS 51  03/08/2017 1108   BILITOT 0.3 03/08/2017 1108      Component Value Date/Time   TSH 1.160 03/08/2017 1108   TSH 1.040 10/31/2016 0835   TSH 1.98 04/21/2016 0709   Results for GENEVEIVE, FURNESS (MRN [id number]) as of 08/14/2017 09:47  Ref. Range 03/08/2017 11:08  Vitamin D, 25-Hydroxy Latest Ref Range: 30.0 - 100.0 ng/mL 28.2 (L)   ASSESSMENT AND PLAN: Vitamin D deficiency - Plan: Vitamin D, Ergocalciferol, (DRISDOL) 50000 units CAPS capsule  Insulin resistance - Plan: metFORMIN (GLUCOPHAGE) 500 MG tablet  At risk for diabetes mellitus  Class 3 severe obesity with serious comorbidity and body mass index (BMI) of 45.0 to 49.9 in adult, unspecified obesity type ([place])  PLAN:  Vitamin D Deficiency Adriane was informed that low vitamin D levels contributes to fatigue and are associated with obesity, breast, and colon cancer. She agrees to continue to take prescription Vit D @50 ,000 IU every week #4 with no refills. We will recheck vitamin D level at the next visit and she will follow up for routine testing of vitamin D, at least 2-3 times per year. She was informed of the risk of over-replacement of vitamin D and agrees to not increase her dose unless she discusses this with [name] first. Lakynn agrees to follow up as directed.  Insulin Resistance Alizey will continue to work on weight loss, exercise, and decreasing simple carbohydrates in her diet to help decrease the risk of diabetes. We dicussed metformin including benefits and risks. She was informed that eating too many simple carbohydrates or too many calories at one sitting increases the likelihood of GI side effects. Ranell agrees to restart metformin 500 mg qd #30 with no refills and we will recheck insulin and A1c at the next visit. Salaya agreed to follow up with [name] as directed to monitor her progress.  Diabetes risk  counseling Coraleigh was given extended (15 minutes) diabetes prevention counseling today. She is 47 y.o. female and has risk  factors for diabetes including obesity and insulin resistance. We discussed intensive lifestyle modifications today with an emphasis on weight loss as well as increasing exercise and decreasing simple carbohydrates in her diet.  Obesity Hedda is currently in the action stage of change. As such, her goal is to continue with weight loss efforts She has agreed to follow the Category 3 plan  We reviewed meal plan and she was given snack options. Akyia has been instructed to work up to a goal of 150 minutes of combined cardio and strengthening exercise per week for weight loss and overall health benefits. We discussed the following Behavioral Modification Strategies today: no skipping meals, increasing lean protein intake and work on meal planning and easy cooking plans  Tashaya has agreed to follow up with our clinic in 3 weeks. She was informed of the importance of frequent follow up visits to maximize her success with intensive lifestyle modifications for her multiple health conditions.  I, Doreene Nest, am acting as transcriptionist for Dennard Nip, MD  I have reviewed the above documentation for accuracy and completeness, and I agree with the above. -Dennard Nip, MD

## 2017-08-24 ENCOUNTER — Encounter: Payer: Self-pay | Admitting: Family Medicine

## 2017-08-28 ENCOUNTER — Encounter (INDEPENDENT_AMBULATORY_CARE_PROVIDER_SITE_OTHER): Payer: Self-pay | Admitting: Family Medicine

## 2017-08-28 NOTE — Telephone Encounter (Signed)
Please cancel

## 2017-08-31 ENCOUNTER — Ambulatory Visit (INDEPENDENT_AMBULATORY_CARE_PROVIDER_SITE_OTHER): Payer: BLUE CROSS/BLUE SHIELD | Admitting: Family Medicine

## 2017-08-31 ENCOUNTER — Encounter (INDEPENDENT_AMBULATORY_CARE_PROVIDER_SITE_OTHER): Payer: Self-pay

## 2017-10-13 ENCOUNTER — Encounter: Payer: Self-pay | Admitting: Family

## 2017-10-14 ENCOUNTER — Other Ambulatory Visit (INDEPENDENT_AMBULATORY_CARE_PROVIDER_SITE_OTHER): Payer: Self-pay | Admitting: Family Medicine

## 2017-10-14 DIAGNOSIS — E8881 Metabolic syndrome: Secondary | ICD-10-CM

## 2017-11-18 IMAGING — CT CT HEAD W/O CM
3 series · 15 of 47 positions shown, 18 images · non-contrast
Comparison: 03/22/2013

CLINICAL DATA: Headache every week for 1 month.

EXAM:
CT HEAD WITHOUT CONTRAST
TECHNIQUE: Contiguous axial images were obtained from the base of the skull
through the vertex without intravenous contrast.

[Series 2: head wo · axial · 0.43mm/px · z∈[-151,-26]mm · 9 of 31 slices shown, 12 images]
[im 3/31  brain]
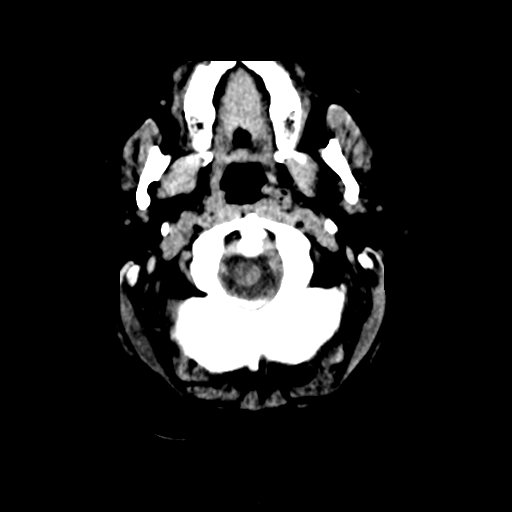
[im 3/31  bone]
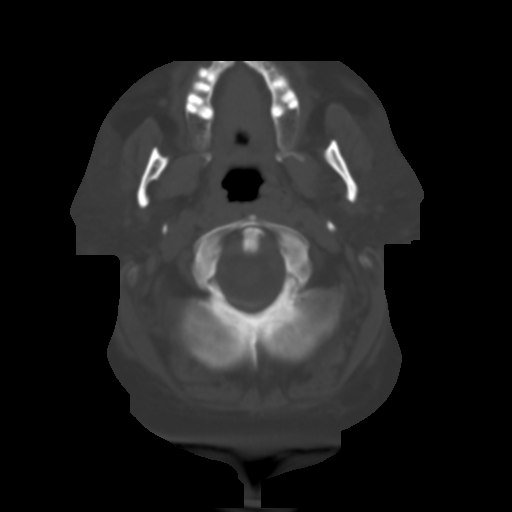
[im 6/31  brain]
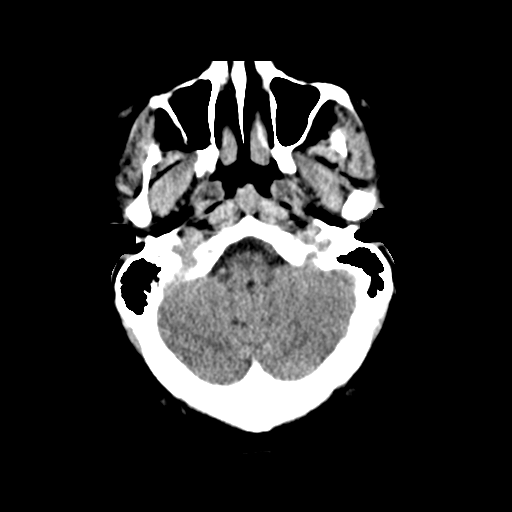
[im 9/31  brain]
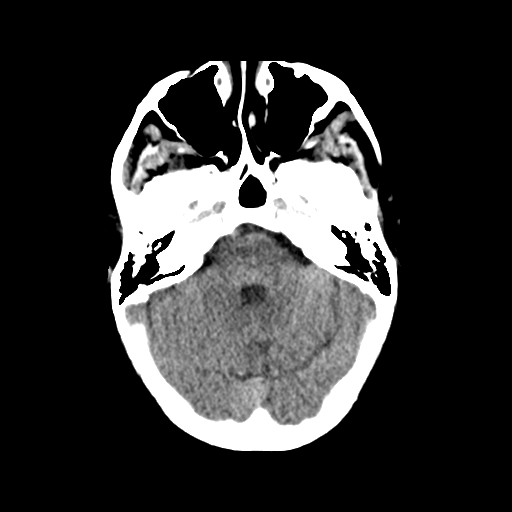
[im 12/31  brain]
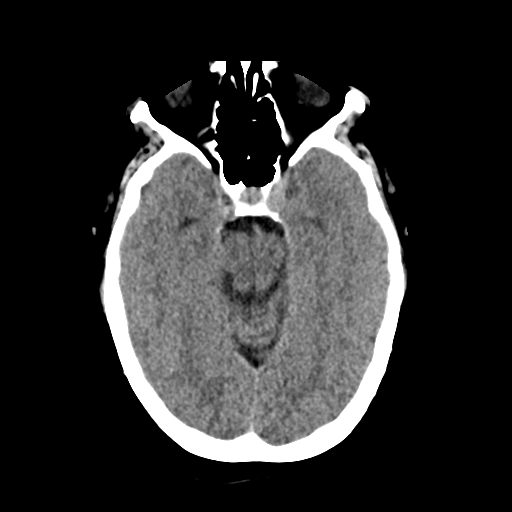
[im 16/31  brain]
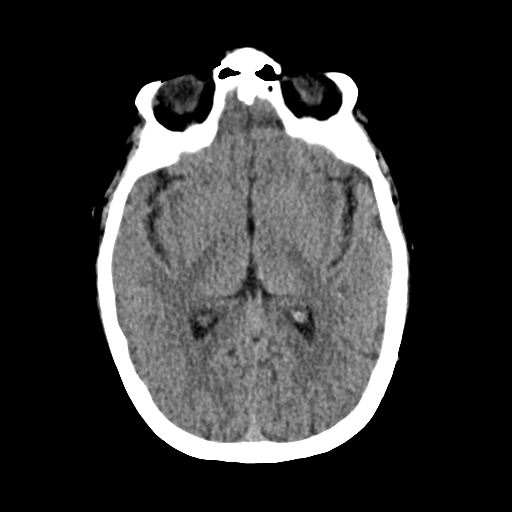
[im 16/31  bone]
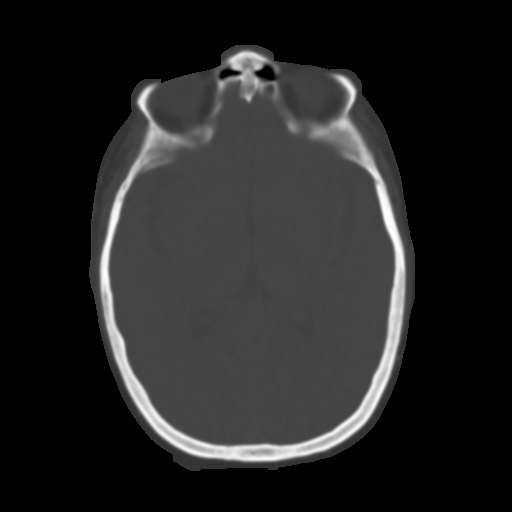
[im 19/31  brain]
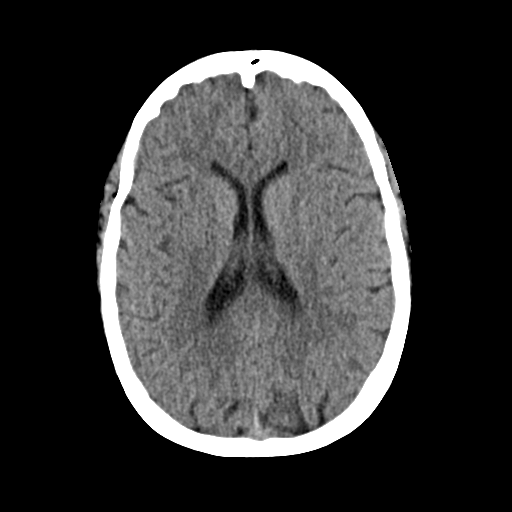
[im 22/31  brain]
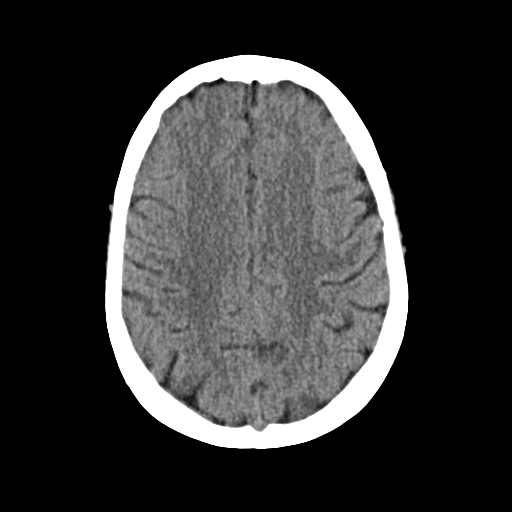
[im 25/31  brain]
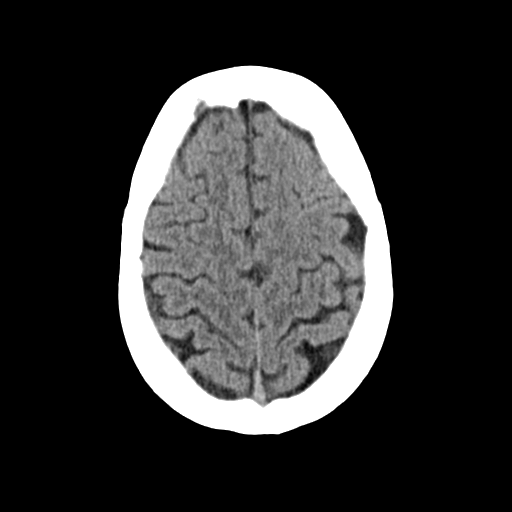
[im 28/31  brain]
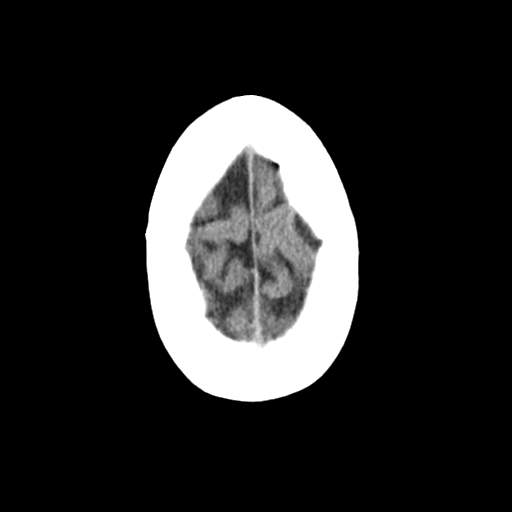
[im 28/31  bone]
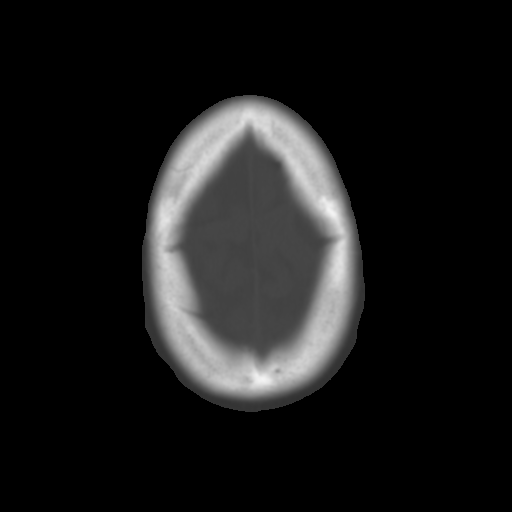

[Series 4: coronal soft · coronal · 0.38mm/px · 3 of 57 slices shown]
[im 19/57  brain]
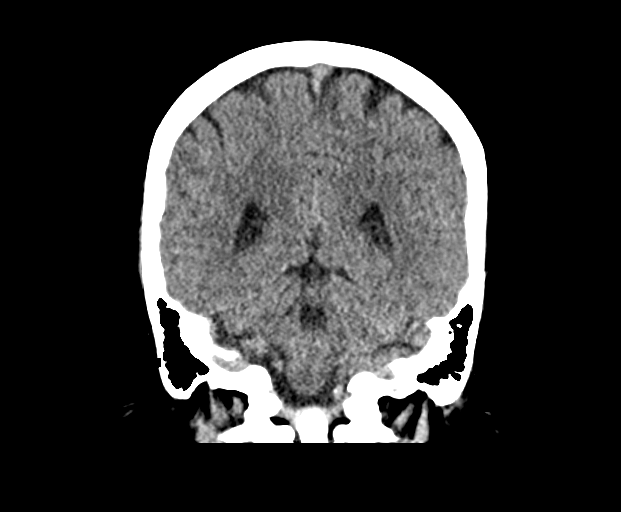
[im 25/57  brain]
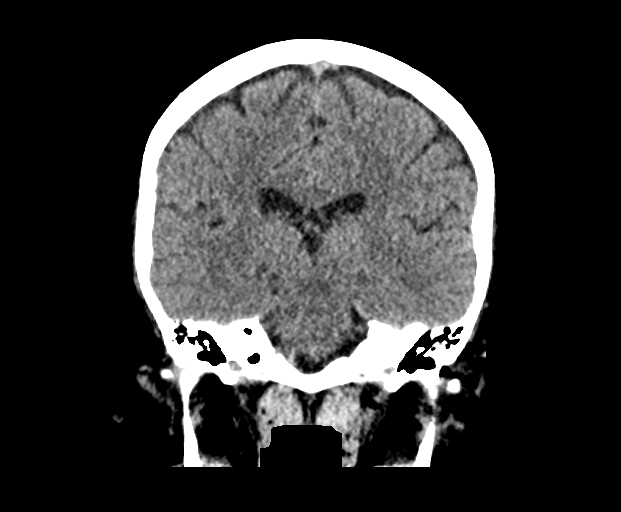
[im 32/57  brain]
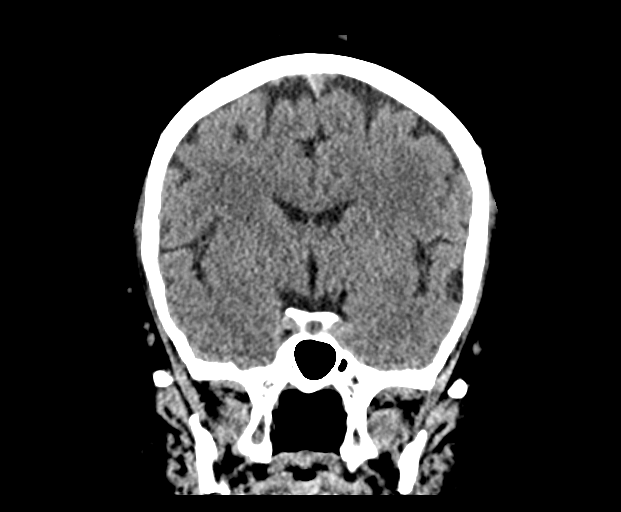

[Series 5: sag soft · sagittal · 0.36mm/px · 3 of 53 slices shown]
[im 18/53  brain]
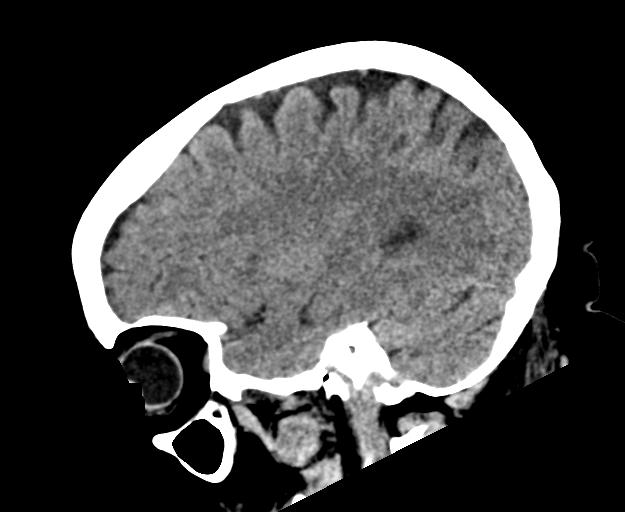
[im 27/53  brain]
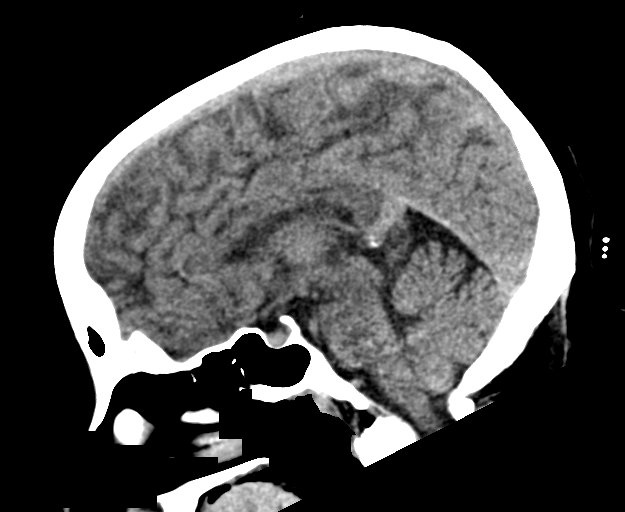
[im 35/53  brain]
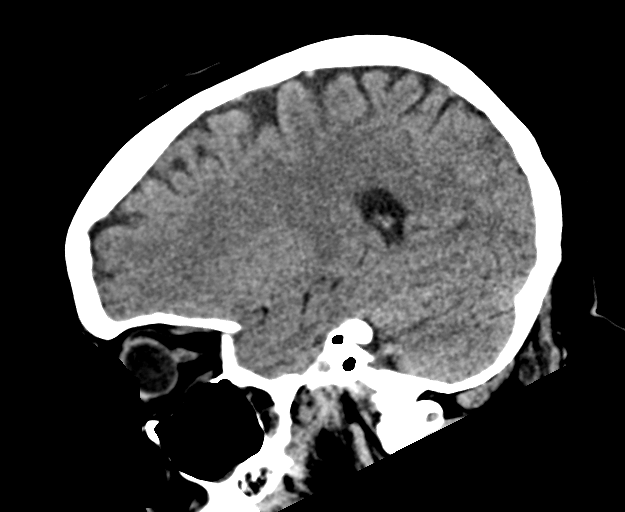

[15 of 47 positions shown; findings below may reference images not displayed]

FINDINGS: Brain: No evidence of acute infarction, hemorrhage, hydrocephalus,
extra-axial collection or mass lesion/mass effect.

Vascular: No hyperdense vessel or unexpected calcification.

Skull: No osseous abnormality.

Sinuses/Orbits: Visualized paranasal sinuses are clear. Visualized
mastoid sinuses are clear. Visualized orbits demonstrate no focal
abnormality.

Other: None
IMPRESSION: No acute intracranial pathology.

## 2018-01-07 ENCOUNTER — Emergency Department (HOSPITAL_BASED_OUTPATIENT_CLINIC_OR_DEPARTMENT_OTHER): Payer: Self-pay

## 2018-01-07 ENCOUNTER — Other Ambulatory Visit: Payer: Self-pay

## 2018-01-07 ENCOUNTER — Emergency Department (HOSPITAL_BASED_OUTPATIENT_CLINIC_OR_DEPARTMENT_OTHER)
Admission: EM | Admit: 2018-01-07 | Discharge: 2018-01-07 | Disposition: A | Discharge status: Home / Self Care | Payer: Self-pay | Attending: Emergency Medicine | Admitting: Emergency Medicine

## 2018-01-07 ENCOUNTER — Encounter (HOSPITAL_BASED_OUTPATIENT_CLINIC_OR_DEPARTMENT_OTHER): Payer: Self-pay | Admitting: Emergency Medicine

## 2018-01-07 DIAGNOSIS — Z7984 Long term (current) use of oral hypoglycemic drugs: Secondary | ICD-10-CM | POA: Insufficient documentation

## 2018-01-07 DIAGNOSIS — E119 Type 2 diabetes mellitus without complications: Secondary | ICD-10-CM | POA: Insufficient documentation

## 2018-01-07 DIAGNOSIS — Z7901 Long term (current) use of anticoagulants: Secondary | ICD-10-CM | POA: Insufficient documentation

## 2018-01-07 DIAGNOSIS — Z87891 Personal history of nicotine dependence: Secondary | ICD-10-CM | POA: Insufficient documentation

## 2018-01-07 DIAGNOSIS — I10 Essential (primary) hypertension: Secondary | ICD-10-CM | POA: Insufficient documentation

## 2018-01-07 DIAGNOSIS — M79672 Pain in left foot: Secondary | ICD-10-CM | POA: Insufficient documentation

## 2018-01-07 DIAGNOSIS — Z79899 Other long term (current) drug therapy: Secondary | ICD-10-CM | POA: Insufficient documentation

## 2018-01-07 DIAGNOSIS — E039 Hypothyroidism, unspecified: Secondary | ICD-10-CM | POA: Insufficient documentation

## 2018-01-07 MED ORDER — NAPROXEN 500 MG PO TABS: 500.0000 mg | ORAL_TABLET | Freq: Two times a day (BID) | ORAL | 0 refills | Status: DC

## 2018-01-07 NOTE — Discharge Instructions (Addendum)
You were seen in the emergency department today for foot pain.  Your x-ray was normal.  We are sending him with a prescription for naproxen to help with pain. - Naproxen is a nonsteroidal anti-inflammatory medication that will help with pain and swelling. Be sure to take this medication as prescribed with food, 1 pill every 12 hours,  It should be taken with food, as it can cause stomach upset, and more seriously, stomach bleeding. Do not take other nonsteroidal anti-inflammatory medications with this such as Advil, Motrin, Aleve, Mobic, Goodie Powder, or Motrin.    You make take Tylenol per over the counter dosing with these medications.   We have prescribed you new medication(s) today. Discuss the medications prescribed today with your pharmacist as they can have adverse effects and interactions with your other medicines including over the counter and prescribed medications. Seek medical evaluation if you start to experience new or abnormal symptoms after taking one of these medicines, seek care immediately if you start to experience difficulty breathing, feeling of your throat closing, facial swelling, or rash as these could be indications of a more serious allergic reaction   Please keep the foot elevated as much as you can, please apply ice as needed for swelling and discomfort wrapped in a towel.  Rest as much as possible.   Please follow-up with the sports medicine doctor provided in your discharge instructions or with your orthopedic doctor within the next 1 week for reevaluation.  Please follow-up with your primary care provider within the next 1 to 2 weeks for recheck of your blood pressure as this was elevated in the emergency department today.  Return to the ER for new or worsening symptoms or any other concerns.

## 2018-01-07 NOTE — ED Triage Notes (Signed)
L foot pain and swelling x 1 week. Denies injury.

## 2018-01-07 NOTE — ED Provider Notes (Signed)
[facility] EMERGENCY DEPARTMENT Provider Note   CSN: [id number] Arrival date & time: 01/07/18  1049     History   Chief Complaint Chief Complaint  Patient presents with  . Foot Pain    HPI Sharon Rivers is a 47 y.o. female with a hx of OSA, depression, HTN, migraines, and obesity who presents to the ED with L foot pain x 1.5 weeks. Patient describes the pain as being to the forefoot primarily. 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 She states this footwear is not necessarily new, has been wearing them as required by her job for the past 3 months. She states 6 days ago pain was the worst and the foot seemed swollen when she took it out of her work shoes. It has eased off over the weekend without not needing to work and is currently a 5/10 in severity, alleviated w/ rest, worsened with weightbearing. Tried motrin, ice, and heat application without much relief. Denies numbness, tingling, weakness, injury, redness, warmth, or fevers.   HPI  Past Medical History:  Diagnosis Date  . Anxiety   . Back pain   . Chest pain    a. 07/2015 Myoview: EF 71%, medium size, mild intensity, partially reversible septal defect with overlying breast attenuation -> likely artifact. No significant reversible ischemia.  . Constipation   . Depression   . Edema    feet and legs  . Frequent headaches   . GERD (gastroesophageal reflux disease)   . Hypertension   . Hypothyroidism   . Joint pain   . Migraines   . OSA (obstructive sleep apnea) 03/17/2016  . Osteoarthritis   . Panic anxiety syndrome   . SOB (shortness of breath)    a. 04/2016 Echo: EF 60-65%, Gr1 DD.  [name] Swallowing difficulty     Patient Active Problem List   Diagnosis Date Noted  . Essential hypertension 03/08/2017  . Other specified hypothyroidism 03/08/2017  . Other fatigue 03/08/2017  . Polyphagia 09/26/2016  . Class 3  obesity with serious comorbidity and body mass index (BMI) of 45.0 to 49.9 in adult 09/26/2016  . Low back pain 08/03/2016  . Insulin resistance 06/13/2016  . Vitamin D deficiency 04/12/2016  . OSA (obstructive sleep apnea) 03/17/2016  . Insomnia 02/23/2016  . GERD (gastroesophageal reflux disease) 02/23/2016  . Migraines 02/23/2016  . Depression 02/23/2016  . Hypertension 07/22/2015  . Lateral epicondylitis 07/22/2015  . Hypothyroid 07/22/2015  . Family history of early CAD 07/22/2015  . Carpal tunnel syndrome 04/30/2015  . Bilateral carpal tunnel syndrome 04/30/2015  . Headache 12/12/2013  . Heartburn 12/12/2013  . Morbid obesity ([place]) 09/12/2013  . Chondromalacia patellae 09/12/2013  . Fracture closed, calcaneus 06/13/2013  . Arthropathy 07/14/2012  . Cramp of limb 07/14/2012  . Diarrhea 07/14/2012  . Nausea with vomiting 07/14/2012  . Viral infection 07/14/2012    Past Surgical History:  Procedure Laterality Date  . CESAREAN SECTION  2001 & 2002  . ELBOW SURGERY    . MINOR CARPAL TUNNEL     pinched nerve in elbow  . WISDOM TOOTH EXTRACTION       OB History    Gravida  3   Para  2   Term  2   Preterm      AB  1   Living  2     SAB  TAB      Ectopic      Multiple      Live Births               Home Medications    Prior to Admission medications   Medication Sig Start Date End Date Taking? Authorizing Provider  amLODipine (NORVASC) 10 MG tablet Take 1 tablet (10 mg total) by mouth daily. 08/04/17   [name], NP  fluticasone (FLONASE) 50 MCG/ACT nasal spray SHAKE LIQUID AND USE 2 SPRAYS IN EACH NOSTRIL DAILY 04/14/17   [name], NP  gabapentin (NEURONTIN) 300 MG capsule Take 300 mg 3 (three) times daily by mouth.     [provider]  HYDROcodone-acetaminophen (NORCO) 5-325 MG tablet Take 1 tablet by mouth every 6 (six) hours as needed (for pain). 07/04/17   Molpus, John, MD  levothyroxine (SYNTHROID, LEVOTHROID)  175 MCG tablet TAKE 1 TABLET BY MOUTH DAILY BEFORE BREAKFAST 08/04/17   [name], NP  lisinopril (PRINIVIL,ZESTRIL) 20 MG tablet TAKE 1 TABLET(20 MG) BY MOUTH DAILY 11/16/16   [name], NP  loratadine (CLARITIN) 10 MG tablet Take 1 tablet (10 mg total) by mouth daily. 04/24/17   [name], NP  meclizine (ANTIVERT) 25 MG tablet Take 1 tablet (25 mg total) by mouth 3 (three) times daily as needed for dizziness. 04/24/17   [name], NP  metFORMIN (GLUCOPHAGE) 500 MG tablet Take 1 tablet (500 mg total) by mouth daily with breakfast. 08/09/17   Dennard Nip D, MD  pantoprazole (PROTONIX) 40 MG tablet TAKE 1 TABLET(40 MG) BY MOUTH DAILY 08/04/17   [name], NP  rivaroxaban (XARELTO) 20 MG TABS tablet Take 1 tablet (20 mg total) by mouth daily with supper. 08/04/17   [name], NP  topiramate (TOPAMAX) 50 MG tablet Take 1 tablet (50 mg total) by mouth daily. 03/08/17   Waldon Merl, PA-C  Venlafaxine HCl 150 MG TB24 Take 1 tablet (150 mg total) by mouth daily. 08/04/17   [name], NP  Vitamin D, Ergocalciferol, (DRISDOL) 50000 units CAPS capsule Take 1 capsule (50,000 Units total) by mouth every 7 (seven) days. 08/09/17   Starlyn Skeans, MD    Family History Family History  Problem Relation Age of Onset  . Hypertension Mother        Living  . Arthritis Mother   . Thyroid disease Mother   . Heart disease Mother        MI at age 40  . Heart attack Mother   . Migraines Mother   . Depression Mother   . Obesity Mother   . Hypertension Maternal Grandmother   . Parkinson's disease Maternal Grandmother   . Alzheimer's disease Maternal Grandmother   . Cancer Maternal Grandfather   . Hypertension Maternal Grandfather   . Hypertension Paternal Grandmother   . Hypertension Paternal Grandfather   . Gout Brother   . ADD / ADHD Son        x1  . Other Son        #2-Unknown  . Diabetes Neg Hx     Social History Social History    Tobacco Use  . Smoking status: Former Research scientist (life sciences)  . Smokeless tobacco: Never Used  . Tobacco comment: quit 1992  Substance Use Topics  . Alcohol use: No    Alcohol/week: 0.0 standard drinks  . Drug use: No     Allergies   Adhesive [tape]; Biofreeze [menthol (topical analgesic)]; and Sulfa antibiotics   Review of Systems Review  of Systems  Constitutional: Negative for chills and fever.  Respiratory: Negative for shortness of breath.   Cardiovascular: Negative for chest pain.  Musculoskeletal:       Positive for pain to L foot. Positive for swelling to L foot which has since resolved.   Skin: Negative for color change, rash and wound.  Neurological: Negative for dizziness, weakness, numbness and headaches.       Negative for paresthesias.      Physical Exam Updated Vital Signs BP (!) 199/110 (BP Location: Right Arm)   Pulse 82   Temp 98.6 F (37 C) (Oral)   Resp 20   Ht 5\' 2"  (1.575 m)   Wt 122.5 kg   LMP 12/24/2017   SpO2 99%   BMI 49.38 kg/m   Physical Exam  Constitutional: She appears well-developed and well-nourished. No distress.  HENT:  Head: Normocephalic and atraumatic.  Eyes: Conjunctivae are normal. Right eye exhibits no discharge. Left eye exhibits no discharge.  Cardiovascular:  Pulses:      Dorsalis pedis pulses are 2+ on the right side, and 2+ on the left side.       Posterior tibial pulses are 2+ on the right side, and 2+ on the left side.  Musculoskeletal:  No obvious deformity, appreciable swelling/edema, open wounds, warmth, rashes, or ecchymosis.  Lower extremities: Patient has normal AROM to bilateral ankles and all digits. She is tender over the forefoot of the L foot diffusely. Tenderness does not extend to the digits or proximally to the mid foot. No tenderness to the base of the fifth, navicular bone, or the malleoli. Discomfort with metatarsal squeeze test. NVI distally. No other lower extremity tenderness, no calf tenderness.    Neurological: She is alert.  Clear speech. Sensation grossly intact to bilateral lower extremities. 5/5 strength with plantar/dorsiflexion bilaterally.   Skin: Skin is warm and dry. Capillary refill takes less than 2 seconds.  Psychiatric: She has a normal mood and affect. Her behavior is normal. Thought content normal.  Nursing note and vitals reviewed.    ED Treatments / Results  Labs (all labs ordered are listed, but only abnormal results are displayed) Labs Reviewed - No data to display  EKG None  Radiology Dg Foot Complete Left  Result Date: 01/07/2018 CLINICAL DATA:  Left foot pain. EXAM: LEFT FOOT - COMPLETE 3+ VIEW COMPARISON:  None. FINDINGS: There is no evidence of fracture or dislocation. There is no evidence of arthropathy or other focal bone abnormality. Mild dorsal soft tissue swelling of the midfoot. IMPRESSION: No acute fracture or dislocation identified about the left foot. Electronically Signed   By: Fidela Salisbury M.D.   On: 01/07/2018 11:54    Procedures Procedures (including critical care time)  SPLINT APPLICATION Date/Time: 62:37 PM Authorized by: Kennith Maes Consent: Verbal consent obtained. Risks and benefits: risks, benefits and alternatives were discussed Consent given by: patient Splint applied by:EDT Location details: L foot Splint type: post op shoe Post-procedure: The splinted body part was neurovascularly unchanged following the procedure. Patient tolerance: Patient tolerated the procedure well with no immediate complications.     Medications Ordered in ED Medications - No data to display   Initial Impression / Assessment and Plan / ED Course  I have reviewed the triage vital signs and the nursing notes.  Pertinent labs & imaging results that were available during my care of the patient were reviewed by me and considered in my medical decision making (see chart for details).   Patient  presents with atraumatic L foot pain.  Patient nontoxic appearing, in no apparent distress, vitals WNL with the exception of elevated BP- doubt HTN emergency, patient aware of need for PCP recheck. Exam without overlying erythema/warmth, patient afebrile, doubt infectious etiology such as septic joint, osteomyelitis, or cellulitis. No appreciable edema, sxs isolated to the forefoot, doubt DVT. Xray without fracture/dislocation. Tender over the forefoot. Unclear definitive cause, likely related to steel toed boots and being on her feet all day. Will provide post op shoe, naproxen, recommendation for PRICE, and orthopedics follow up. I discussed results, treatment plan, need for follow-up, and return precautions with the patient. Provided opportunity for questions, patient confirmed understanding and is in agreement with plan.   Vitals:   01/07/18 1056 01/07/18 1238  BP: (!) 199/110 (!) 162/95  Pulse: 82 82  Resp: 20 20  Temp: 98.6 F (37 C)   SpO2: 99% 100%    Final Clinical Impressions(s) / ED Diagnoses   Final diagnoses:  Left foot pain    ED Discharge Orders         Ordered    naproxen (NAPROSYN) 500 MG tablet  2 times daily     01/07/18 [address], PA-C 01/07/18 1255    Davonna Belling, MD 01/07/18 1527

## 2018-01-10 ENCOUNTER — Encounter: Payer: Self-pay | Admitting: Podiatry

## 2018-02-25 DIAGNOSIS — M7741 Metatarsalgia, right foot: Secondary | ICD-10-CM | POA: Insufficient documentation

## 2018-06-09 ENCOUNTER — Emergency Department (HOSPITAL_BASED_OUTPATIENT_CLINIC_OR_DEPARTMENT_OTHER)
Admission: EM | Admit: 2018-06-09 | Discharge: 2018-06-09 | Disposition: A | Discharge status: Home / Self Care | Payer: Self-pay | Attending: Emergency Medicine | Admitting: Emergency Medicine

## 2018-06-09 ENCOUNTER — Encounter (HOSPITAL_BASED_OUTPATIENT_CLINIC_OR_DEPARTMENT_OTHER): Payer: Self-pay | Admitting: Emergency Medicine

## 2018-06-09 ENCOUNTER — Other Ambulatory Visit: Payer: Self-pay

## 2018-06-09 DIAGNOSIS — I10 Essential (primary) hypertension: Secondary | ICD-10-CM | POA: Insufficient documentation

## 2018-06-09 DIAGNOSIS — Z7984 Long term (current) use of oral hypoglycemic drugs: Secondary | ICD-10-CM | POA: Insufficient documentation

## 2018-06-09 DIAGNOSIS — K029 Dental caries, unspecified: Secondary | ICD-10-CM | POA: Insufficient documentation

## 2018-06-09 DIAGNOSIS — Z87891 Personal history of nicotine dependence: Secondary | ICD-10-CM | POA: Insufficient documentation

## 2018-06-09 DIAGNOSIS — Z79899 Other long term (current) drug therapy: Secondary | ICD-10-CM | POA: Insufficient documentation

## 2018-06-09 DIAGNOSIS — K0381 Cracked tooth: Secondary | ICD-10-CM | POA: Insufficient documentation

## 2018-06-09 DIAGNOSIS — E039 Hypothyroidism, unspecified: Secondary | ICD-10-CM | POA: Insufficient documentation

## 2018-06-09 DIAGNOSIS — Z7901 Long term (current) use of anticoagulants: Secondary | ICD-10-CM | POA: Insufficient documentation

## 2018-06-09 MED ORDER — ACETAMINOPHEN 325 MG PO TABS
650.0000 mg | ORAL_TABLET | Freq: Once | ORAL | Status: AC
  Administered 2018-06-09: 650 mg via ORAL
  Filled 2018-06-09: qty 2

## 2018-06-09 MED ORDER — CHLORHEXIDINE GLUCONATE 0.12 % MT SOLN: 15.0000 mL | Freq: Two times a day (BID) | OROMUCOSAL | 0 refills | Status: DC

## 2018-06-09 MED ORDER — LIDOCAINE VISCOUS HCL 2 % MT SOLN
15.0000 mL | Freq: Once | OROMUCOSAL | Status: AC
  Administered 2018-06-09: 18:00:00 15 mL via OROMUCOSAL
  Filled 2018-06-09: qty 15

## 2018-06-09 MED ORDER — PENICILLIN V POTASSIUM 500 MG PO TABS: 500.0000 mg | ORAL_TABLET | Freq: Four times a day (QID) | ORAL | 0 refills | Status: AC

## 2018-06-09 NOTE — Discharge Instructions (Signed)
You have been diagnosed today with pain due to dental cavities and asymptomatic high blood pressure.  At this time there does not appear to be the presence of an emergent medical condition, however there is always the potential for conditions to change. Please read and follow the below instructions.  Please return to the Emergency Department immediately for any new or worsening symptoms. Please be sure to follow up with your Primary Care Provider within one week regarding your visit today; please call their office to schedule an appointment even if you are feeling better for a follow-up visit. Please take the antibiotic penicillin as prescribed 4 times daily for the next 7 days to help avoid infection.  Call your dentist on Monday to schedule a follow-up appointment.  If you do not have a dentist you may use the resource guide below to establish 1.  You may use over-the-counter anti-inflammatory such as Tylenol and ibuprofen as directed on the packaging to help with your pain.  You may use the Peridex mouth rinse to help with your symptoms do not swallow Peridex.  Heat compresses may also help with your dental pain. Additionally your blood pressure was elevated today in the emergency department please take your blood pressure medications as prescribed by your primary care provider.  Call your primary care provider on Monday to schedule a follow-up appointment for blood pressure recheck and medication management.  Return to the emergency department if you develop any of the symptoms of high blood pressure including chest pain, shortness of breath, headache, vision changes or any other new or concerning symptoms.  Get help right away if: You cannot open your mouth. You are having trouble breathing or swallowing. You have a fever. Your face, neck, or jaw is swollen. Get help right away if: You get a very bad headache. You start to feel confused. You feel weak or numb. You feel faint. You get very bad  pain in your: Chest. Belly (abdomen). You throw up (vomit) more than once. You have trouble breathing. Any new/concerning or worsening symptoms.  Please read the additional information packets attached to your discharge summary.  Do not take your medicine if  develop an itchy rash, swelling in your mouth or lips, or difficulty breathing.  ------------  Dental Assistance  If unable to pay or uninsured, contact:  [facility] or [facility]. to become qualified for the adult dental clinic.  Patients with Medicaid: [facility] [phone] [address], [place] [address], [phone]  If unable to pay, or uninsured, contact [facility] [phone]) or [place] [phone] in [place], [place] in [facility]) to become qualified for the adult dental clinic   Other [place], [place], [place], [address], [place], [place], 2nd and 4th Thursday of the month at 6:30am.  10 clients each day by appointment, can sometimes see walk-in patients if someone does not show for an appointment. [facility]- [address] [place], [place], [address], [place], [place], [place], [address], [place] Department- [phone] [facility] [phone]

## 2018-06-09 NOTE — ED Provider Notes (Signed)
[facility] Provider Note   CSN: [id number] Arrival date & time: 06/09/18  1654    History   Chief Complaint Chief Complaint  Patient presents with   Dental Pain    HPI Nakaila Freeze is a 48 y.o. female presenting today for concern of dental pain and swelling.  Patient reports that she has had a "cracked tooth" on the left upper tooth #9 for multiple weeks.  She reports that she has had no pain or problems with this area until today when she woke up.  She endorses a severe in intensity constant aching throb worsened with palpation and chewing and without alleviating factors.  Patient took 1 ibuprofen this morning without improvement of her symptoms.  Patient feels as though the area is swollen.  Patient denies any difficulty swallowing, trouble breathing, facial swelling, swelling of the neck, trismus or additional concerns.  Triage vital signs reveal hypertension, patient denies any headache, visual changes, chest pain, shortness of breath, oliguria or additional concerns that she reports that she has known history of hypertension for which she is followed by her primary care provider and has not taken her medication today secondary to dental pain.     HPI  Past Medical History:  Diagnosis Date   Anxiety    Back pain    Chest pain    a. 07/2015 Myoview: EF 71%, medium size, mild intensity, partially reversible septal defect with overlying breast attenuation -> likely artifact. No significant reversible ischemia.   Constipation    Depression    Edema    feet and legs   Frequent headaches    GERD (gastroesophageal reflux disease)    Hypertension    Hypothyroidism    Joint pain    Migraines    OSA (obstructive sleep apnea) 03/17/2016   Osteoarthritis    Panic anxiety syndrome    SOB (shortness of breath)    a. 04/2016 Echo: EF 60-65%, Gr1 DD.   Swallowing difficulty     Patient Active Problem List   Diagnosis Date Noted    Essential hypertension 03/08/2017   Other specified hypothyroidism 03/08/2017   Other fatigue 03/08/2017   Polyphagia 09/26/2016   Class 3 obesity with serious comorbidity and body mass index (BMI) of 45.0 to 49.9 in adult 09/26/2016   Low back pain 08/03/2016   Insulin resistance 06/13/2016   Vitamin D deficiency 04/12/2016   OSA (obstructive sleep apnea) 03/17/2016   Insomnia 02/23/2016   GERD (gastroesophageal reflux disease) 02/23/2016   Migraines 02/23/2016   Depression 02/23/2016   Hypertension 07/22/2015   Lateral epicondylitis 07/22/2015   Hypothyroid 07/22/2015   Family history of early CAD 07/22/2015   Carpal tunnel syndrome 04/30/2015   Bilateral carpal tunnel syndrome 04/30/2015   Headache 12/12/2013   Heartburn 12/12/2013   Morbid obesity ([place]) 09/12/2013   Chondromalacia patellae 09/12/2013   Fracture closed, calcaneus 06/13/2013   Arthropathy 07/14/2012   Cramp of limb 07/14/2012   Diarrhea 07/14/2012   Nausea with vomiting 07/14/2012   Viral infection 07/14/2012    Past Surgical History:  Procedure Laterality Date   CESAREAN SECTION  2001 & 2002   ELBOW SURGERY     MINOR CARPAL TUNNEL     pinched nerve in elbow   WISDOM TOOTH EXTRACTION       OB History    Gravida  3   Para  2   Term  2   Preterm      AB  1  Living  2     SAB      TAB      Ectopic      Multiple      Live Births               Home Medications    Prior to Admission medications   Medication Sig Start Date End Date Taking? Authorizing Provider  amLODipine (NORVASC) 10 MG tablet Take 1 tablet (10 mg total) by mouth daily. 08/04/17   [name], NP  chlorhexidine (PERIDEX) 0.12 % solution Use as directed 15 mLs in the mouth or throat 2 (two) times daily. RINSE AND SPIT. Do NOT swallow. 06/09/18   Nuala Alpha A, PA-C  fluticasone (FLONASE) 50 MCG/ACT nasal spray SHAKE LIQUID AND USE 2 SPRAYS IN EACH NOSTRIL DAILY 04/14/17    [name], NP  gabapentin (NEURONTIN) 300 MG capsule Take 300 mg 3 (three) times daily by mouth.     [provider]  HYDROcodone-acetaminophen (NORCO) 5-325 MG tablet Take 1 tablet by mouth every 6 (six) hours as needed (for pain). 07/04/17   Molpus, John, MD  levothyroxine (SYNTHROID, LEVOTHROID) 175 MCG tablet TAKE 1 TABLET BY MOUTH DAILY BEFORE BREAKFAST 08/04/17   [name], NP  lisinopril (PRINIVIL,ZESTRIL) 20 MG tablet TAKE 1 TABLET(20 MG) BY MOUTH DAILY 11/16/16   [name], NP  loratadine (CLARITIN) 10 MG tablet Take 1 tablet (10 mg total) by mouth daily. 04/24/17   [name], NP  meclizine (ANTIVERT) 25 MG tablet Take 1 tablet (25 mg total) by mouth 3 (three) times daily as needed for dizziness. 04/24/17   [name], NP  metFORMIN (GLUCOPHAGE) 500 MG tablet Take 1 tablet (500 mg total) by mouth daily with breakfast. 08/09/17   Dennard Nip D, MD  naproxen (NAPROSYN) 500 MG tablet Take 1 tablet (500 mg total) by mouth 2 (two) times daily. 01/07/18   Petrucelli, Samantha R, PA-C  pantoprazole (PROTONIX) 40 MG tablet TAKE 1 TABLET(40 MG) BY MOUTH DAILY 08/04/17   [name], NP  penicillin v potassium (VEETID) 500 MG tablet Take 1 tablet (500 mg total) by mouth 4 (four) times daily for 7 days. 06/09/18 06/16/18  Nuala Alpha A, PA-C  rivaroxaban (XARELTO) 20 MG TABS tablet Take 1 tablet (20 mg total) by mouth daily with supper. 08/04/17   [name], NP  topiramate (TOPAMAX) 50 MG tablet Take 1 tablet (50 mg total) by mouth daily. 03/08/17   Waldon Merl, PA-C  Venlafaxine HCl 150 MG TB24 Take 1 tablet (150 mg total) by mouth daily. 08/04/17   [name], NP  Vitamin D, Ergocalciferol, (DRISDOL) 50000 units CAPS capsule Take 1 capsule (50,000 Units total) by mouth every 7 (seven) days. 08/09/17   Starlyn Skeans, MD    Family History Family History  Problem Relation Age of Onset   Hypertension Mother         Living   Arthritis Mother    Thyroid disease Mother    Heart disease Mother        MI at age 23   Heart attack Mother    Migraines Mother    Depression Mother    Obesity Mother    Hypertension Maternal Grandmother    Parkinson's disease Maternal Grandmother    Alzheimer's disease Maternal Grandmother    Cancer Maternal Grandfather    Hypertension Maternal Grandfather    Hypertension Paternal Grandmother    Hypertension Paternal Grandfather    Gout Brother    ADD /  ADHD Son        x1   Other Son        #2-Unknown   Diabetes Neg Hx     Social History Social History   Tobacco Use   Smoking status: Former Smoker   Smokeless tobacco: Never Used   Tobacco comment: quit 1992  Substance Use Topics   Alcohol use: No    Alcohol/week: 0.0 standard drinks   Drug use: No     Allergies   Adhesive [tape]; Biofreeze [menthol (topical analgesic)]; and Sulfa antibiotics   Review of Systems Review of Systems  Constitutional: Negative for chills and fever.  HENT: Positive for dental problem. Negative for facial swelling (Reports some swelling around gum, not facial swelling), sore throat, trouble swallowing and voice change.   Eyes: Negative.  Negative for visual disturbance.  Respiratory: Negative.  Negative for cough and shortness of breath.   Cardiovascular: Negative.  Negative for chest pain.  Gastrointestinal: Negative.  Negative for abdominal pain, diarrhea, nausea and vomiting.  Musculoskeletal: Negative.  Negative for neck pain and neck stiffness.  Neurological: Negative.  Negative for weakness and headaches.  All other systems reviewed and are negative.  Physical Exam Updated Vital Signs BP (!) 206/119 (BP Location: Left Arm)    Pulse 78    Temp 98.4 F (36.9 C) (Oral)    Resp 20    Ht 5\' 2"  (1.575 m)    Wt 127 kg    LMP 06/05/2018    SpO2 100%    BMI 51.21 kg/m   Physical Exam Constitutional:      General: She is not in acute distress.     Appearance: Normal appearance. She is well-developed. She is obese. She is not ill-appearing or diaphoretic.  HENT:     Head: Normocephalic and atraumatic.     Comments: Patient with very poor dentition overall multiple dental caries and rotting teeth present.  She has pain to percussion of tooth #9, surrounding gingiva with some erythema no obvious swelling or drainage is present.  The patient has normal phonation and is in control of secretions. No stridor.  Midline uvula without edema. Soft palate rises symmetrically. No tonsillar erythema, swelling or exudates. Tongue protrusion is normal, floor of mouth is soft. No trismus. No creptius on neck palpation. No gingival erythema or fluctuance noted. Mucus membranes moist.    Right Ear: External ear normal.     Left Ear: External ear normal.     Nose: Nose normal.     Mouth/Throat:   Eyes:     General: Vision grossly intact. Gaze aligned appropriately.     Extraocular Movements: Extraocular movements intact.     Conjunctiva/sclera: Conjunctivae normal.     Pupils: Pupils are equal, round, and reactive to light.  Neck:     Musculoskeletal: Full passive range of motion without pain, normal range of motion and neck supple.     Trachea: Trachea and phonation normal. No tracheal tenderness or tracheal deviation.  Pulmonary:     Effort: Pulmonary effort is normal. No respiratory distress.  Abdominal:     General: There is no distension.     Palpations: Abdomen is soft.     Tenderness: There is no abdominal tenderness. There is no guarding or rebound.  Musculoskeletal: Normal range of motion.  Skin:    General: Skin is warm and dry.  Neurological:     Mental Status: She is alert.     GCS: GCS eye subscore is 4. GCS  verbal subscore is 5. GCS motor subscore is 6.     Comments: Speech is clear and goal oriented, follows commands Major Cranial nerves without deficit, no facial droop Moves extremities without ataxia, coordination intact    Psychiatric:        Behavior: Behavior normal.    ED Treatments / Results  Labs (all labs ordered are listed, but only abnormal results are displayed) Labs Reviewed - No data to display  EKG None  Radiology No results found.  Procedures Procedures (including critical care time)  Medications Ordered in ED Medications  acetaminophen (TYLENOL) tablet 650 mg (650 mg Oral Given 06/09/18 1820)  lidocaine (XYLOCAINE) 2 % viscous mouth solution 15 mL (15 mLs Mouth/Throat Given 06/09/18 1821)     Initial Impression / Assessment and Plan / ED Course  I have reviewed the triage vital signs and the nursing notes.  Pertinent labs & imaging results that were available during my care of the patient were reviewed by me and considered in my medical decision making (see chart for details).     48 year old female presenting today with dental pain.  Very poor dentition overall,, pain to percussion of left upper incisor infected dental surface cavity noted without signs or symptoms of dental abscess, no swelling of the gums.  Patient is well-appearing, afebrile, nontoxic, speaking well patient is able to swallow without pain.  No signs of swelling or concern for Ludwig's angina, peritonsillar abscess, retropharyngeal abscess, facial cellulitis, preseptal/orbital cellulitis or other deep tissue infections of the head or neck.  There are no signs of swelling of the neck and patient has good range of motion without pain.  Patient has been provided with Tylenol and viscous lidocaine here in the emergency department with some relief of her symptoms.  On reevaluation she is resting comfortably and requesting discharge.  Plan to treat with penicillin VK 500 mg 4 times daily x7 days, patient urged to follow-up with dentist and resources guide was given.  Additionally patient had to be hypertensive here in the emergency department.  She has known history of hypertension and has not taken her blood pressure  medication today secondary to dental pain.  She is asymptomatic regarding her hypertension denying chest pain, shortness of breath, headache, visual changes, oliguria, extremity swelling or any additional concerns.  I have encouraged patient to follow-up with her primary care provider this week for blood pressure recheck and medication management.  I also encouraged patient to take her blood pressure medication when she gets home.    At this time there does not appear to be any evidence of an acute emergency medical condition and the patient appears stable for discharge with appropriate outpatient follow up. Diagnosis was discussed with patient who verbalizes understanding of care plan and is agreeable to discharge. I have discussed return precautions with patient who verbalizes understanding of return precautions. Patient encouraged to follow-up with their PCP and dentist. All questions answered.  Patient has been discharged in good condition.  Patient's case discussed with Dr. Regenia Skeeter who agrees with plan to discharge with follow-up.   Note: Portions of this report may have been transcribed using voice recognition software. Every effort was made to ensure accuracy; however, inadvertent computerized transcription errors may still be present. Final Clinical Impressions(s) / ED Diagnoses   Final diagnoses:  Pain due to dental caries  Asymptomatic hypertension    ED Discharge Orders         Ordered    penicillin v potassium (VEETID) 500  MG tablet  4 times daily     06/09/18 1857    chlorhexidine (PERIDEX) 0.12 % solution  2 times daily     06/09/18 1857           Gari Crown 06/09/18 2113    Sherwood Gambler, MD 06/09/18 2118

## 2018-06-09 NOTE — ED Triage Notes (Signed)
Pt has a broken front tooth. Reports pain and facial swelling today.

## 2018-10-09 ENCOUNTER — Other Ambulatory Visit: Payer: Self-pay

## 2018-10-09 ENCOUNTER — Emergency Department (HOSPITAL_BASED_OUTPATIENT_CLINIC_OR_DEPARTMENT_OTHER)
Admission: EM | Admit: 2018-10-09 | Discharge: 2018-10-09 | Disposition: A | Discharge status: Home / Self Care | Payer: Self-pay | Attending: Emergency Medicine | Admitting: Emergency Medicine

## 2018-10-09 ENCOUNTER — Encounter (HOSPITAL_BASED_OUTPATIENT_CLINIC_OR_DEPARTMENT_OTHER): Payer: Self-pay | Admitting: Emergency Medicine

## 2018-10-09 DIAGNOSIS — I10 Essential (primary) hypertension: Secondary | ICD-10-CM | POA: Insufficient documentation

## 2018-10-09 DIAGNOSIS — L02211 Cutaneous abscess of abdominal wall: Secondary | ICD-10-CM | POA: Insufficient documentation

## 2018-10-09 DIAGNOSIS — L0291 Cutaneous abscess, unspecified: Secondary | ICD-10-CM

## 2018-10-09 DIAGNOSIS — Z79899 Other long term (current) drug therapy: Secondary | ICD-10-CM | POA: Insufficient documentation

## 2018-10-09 MED ORDER — DOXYCYCLINE HYCLATE 100 MG PO TABS
100.0000 mg | ORAL_TABLET | Freq: Once | ORAL | Status: AC
  Administered 2018-10-09: 100 mg via ORAL
  Filled 2018-10-09: qty 1

## 2018-10-09 MED ORDER — DOXYCYCLINE HYCLATE 100 MG PO CAPS: 100.0000 mg | ORAL_CAPSULE | Freq: Two times a day (BID) | ORAL | 0 refills | Status: DC

## 2018-10-09 MED ORDER — LIDOCAINE-EPINEPHRINE (PF) 2 %-1:200000 IJ SOLN
10.0000 mL | Freq: Once | INTRAMUSCULAR | Status: AC
  Administered 2018-10-09: 10 mL
  Filled 2018-10-09 (×2): qty 10

## 2018-10-09 MED ORDER — HYDROCODONE-ACETAMINOPHEN 5-325 MG PO TABS: 1.0000 | ORAL_TABLET | ORAL | 0 refills | Status: DC | PRN

## 2018-10-09 NOTE — ED Provider Notes (Signed)
[facility] EMERGENCY DEPARTMENT Provider Note   CSN: [id number] Arrival date & time: 10/09/18  [id number]     History   Chief Complaint Chief Complaint  Patient presents with  . Abscess    HPI Sharon Rivers is a 48 y.o. female.     Pt presents to the ED today with pain to her RUQ.  She has had an abscess there for a few days and it's hurting.  She has had a boil once before in her groin area.  No f/c.     Past Medical History:  Diagnosis Date  . Anxiety   . Back pain   . Chest pain    a. 07/2015 Myoview: EF 71%, medium size, mild intensity, partially reversible septal defect with overlying breast attenuation -> likely artifact. No significant reversible ischemia.  . Constipation   . Depression   . Edema    feet and legs  . Frequent headaches   . GERD (gastroesophageal reflux disease)   . Hypertension   . Hypothyroidism   . Joint pain   . Migraines   . OSA (obstructive sleep apnea) 03/17/2016  . Osteoarthritis   . Panic anxiety syndrome   . SOB (shortness of breath)    a. 04/2016 Echo: EF 60-65%, Gr1 DD.  [name] Swallowing difficulty     Patient Active Problem List   Diagnosis Date Noted  . Essential hypertension 03/08/2017  . Other specified hypothyroidism 03/08/2017  . Other fatigue 03/08/2017  . Polyphagia 09/26/2016  . Class 3 obesity with serious comorbidity and body mass index (BMI) of 45.0 to 49.9 in adult 09/26/2016  . Low back pain 08/03/2016  . Insulin resistance 06/13/2016  . Vitamin D deficiency 04/12/2016  . OSA (obstructive sleep apnea) 03/17/2016  . Insomnia 02/23/2016  . GERD (gastroesophageal reflux disease) 02/23/2016  . Migraines 02/23/2016  . Depression 02/23/2016  . Hypertension 07/22/2015  . Lateral epicondylitis 07/22/2015  . Hypothyroid 07/22/2015  . Family history of early CAD 07/22/2015  . Carpal tunnel syndrome 04/30/2015  . Bilateral carpal tunnel syndrome 04/30/2015  . Headache 12/12/2013  . Heartburn 12/12/2013  . Morbid  obesity ([place]) 09/12/2013  . Chondromalacia patellae 09/12/2013  . Fracture closed, calcaneus 06/13/2013  . Arthropathy 07/14/2012  . Cramp of limb 07/14/2012  . Diarrhea 07/14/2012  . Nausea with vomiting 07/14/2012  . Viral infection 07/14/2012    Past Surgical History:  Procedure Laterality Date  . CESAREAN SECTION  2001 & 2002  . ELBOW SURGERY    . MINOR CARPAL TUNNEL     pinched nerve in elbow  . WISDOM TOOTH EXTRACTION       OB History    Gravida  3   Para  2   Term  2   Preterm      AB  1   Living  2     SAB      TAB      Ectopic      Multiple      Live Births               Home Medications    Prior to Admission medications   Medication Sig Start Date End Date Taking? Authorizing Provider  amLODipine (NORVASC) 10 MG tablet Take 1 tablet (10 mg total) by mouth daily. 08/04/17   [name], NP  chlorhexidine (PERIDEX) 0.12 % solution Use as directed 15 mLs in the mouth or throat 2 (two) times daily. RINSE AND SPIT. Do NOT swallow. 06/09/18  Nuala Alpha A, PA-C  doxycycline (VIBRAMYCIN) 100 MG capsule Take 1 capsule (100 mg total) by mouth 2 (two) times daily. 10/09/18   Isla Pence, MD  fluticasone (FLONASE) 50 MCG/ACT nasal spray SHAKE LIQUID AND USE 2 SPRAYS IN EACH NOSTRIL DAILY 04/14/17   [name], NP  gabapentin (NEURONTIN) 300 MG capsule Take 300 mg 3 (three) times daily by mouth.     [provider]  HYDROcodone-acetaminophen (NORCO/VICODIN) 5-325 MG tablet Take 1 tablet by mouth every 4 (four) hours as needed. 10/09/18   Isla Pence, MD  levothyroxine (SYNTHROID, LEVOTHROID) 175 MCG tablet TAKE 1 TABLET BY MOUTH DAILY BEFORE BREAKFAST 08/04/17   [name], NP  lisinopril (PRINIVIL,ZESTRIL) 20 MG tablet TAKE 1 TABLET(20 MG) BY MOUTH DAILY 11/16/16   [name], NP  loratadine (CLARITIN) 10 MG tablet Take 1 tablet (10 mg total) by mouth daily. 04/24/17   [name], NP  meclizine  (ANTIVERT) 25 MG tablet Take 1 tablet (25 mg total) by mouth 3 (three) times daily as needed for dizziness. 04/24/17   [name], NP  metFORMIN (GLUCOPHAGE) 500 MG tablet Take 1 tablet (500 mg total) by mouth daily with breakfast. 08/09/17   Dennard Nip D, MD  naproxen (NAPROSYN) 500 MG tablet Take 1 tablet (500 mg total) by mouth 2 (two) times daily. 01/07/18   Petrucelli, Samantha R, PA-C  pantoprazole (PROTONIX) 40 MG tablet TAKE 1 TABLET(40 MG) BY MOUTH DAILY 08/04/17   [name], NP  rivaroxaban (XARELTO) 20 MG TABS tablet Take 1 tablet (20 mg total) by mouth daily with supper. 08/04/17   [name], NP  topiramate (TOPAMAX) 50 MG tablet Take 1 tablet (50 mg total) by mouth daily. 03/08/17   Waldon Merl, PA-C  Venlafaxine HCl 150 MG TB24 Take 1 tablet (150 mg total) by mouth daily. 08/04/17   [name], NP  Vitamin D, Ergocalciferol, (DRISDOL) 50000 units CAPS capsule Take 1 capsule (50,000 Units total) by mouth every 7 (seven) days. 08/09/17   Starlyn Skeans, MD    Family History Family History  Problem Relation Age of Onset  . Hypertension Mother        Living  . Arthritis Mother   . Thyroid disease Mother   . Heart disease Mother        MI at age 55  . Heart attack Mother   . Migraines Mother   . Depression Mother   . Obesity Mother   . Hypertension Maternal Grandmother   . Parkinson's disease Maternal Grandmother   . Alzheimer's disease Maternal Grandmother   . Cancer Maternal Grandfather   . Hypertension Maternal Grandfather   . Hypertension Paternal Grandmother   . Hypertension Paternal Grandfather   . Gout Brother   . ADD / ADHD Son        x1  . Other Son        #2-Unknown  . Diabetes Neg Hx     Social History Social History   Tobacco Use  . Smoking status: Former Research scientist (life sciences)  . Smokeless tobacco: Never Used  . Tobacco comment: quit 1992  Substance Use Topics  . Alcohol use: No    Alcohol/week: 0.0 standard drinks  . Drug  use: No     Allergies   Adhesive [tape], Biofreeze [menthol (topical analgesic)], and Sulfa antibiotics   Review of Systems Review of Systems  Skin: Positive for wound.  All other systems reviewed and are negative.    Physical Exam Updated Vital Signs BP [name])  188/109 (BP Location: Right Arm)   Pulse 87   Temp 98.4 F (36.9 C) (Oral)   Resp 20   Ht 5\' 2"  (1.575 m)   Wt 132.6 kg   LMP 10/03/2018   SpO2 99%   BMI 53.48 kg/m   Physical Exam Vitals signs and nursing note reviewed.  Constitutional:      Appearance: Normal appearance. She is obese.  HENT:     Head: Normocephalic and atraumatic.     Right Ear: External ear normal.     Left Ear: External ear normal.     Nose: Nose normal.     Mouth/Throat:     Mouth: Mucous membranes are moist.     Pharynx: Oropharynx is clear.  Eyes:     Extraocular Movements: Extraocular movements intact.     Conjunctiva/sclera: Conjunctivae normal.     Pupils: Pupils are equal, round, and reactive to light.  Neck:     Musculoskeletal: Normal range of motion and neck supple.  Cardiovascular:     Rate and Rhythm: Normal rate and regular rhythm.     Pulses: Normal pulses.     Heart sounds: Normal heart sounds.  Pulmonary:     Effort: Pulmonary effort is normal.     Breath sounds: Normal breath sounds.  Abdominal:     General: Abdomen is flat. Bowel sounds are normal.     Palpations: Abdomen is soft.  Skin:    Capillary Refill: Capillary refill takes less than 2 seconds.     Comments: Abscess RUQ  Neurological:     General: No focal deficit present.     Mental Status: She is alert and oriented to person, place, and time.  Psychiatric:        Mood and Affect: Mood normal.        Behavior: Behavior normal.      ED Treatments / Results  Labs (all labs ordered are listed, but only abnormal results are displayed) Labs Reviewed - No data to display  EKG None  Radiology No results found.  Procedures .Marland KitchenIncision and  Drainage  Date/Time: 10/09/2018 8:21 AM Performed by: Isla Pence, MD Authorized by: Isla Pence, MD   Consent:    Consent obtained:  Verbal   Consent given by:  Patient   Risks discussed:  Bleeding, incomplete drainage and pain Location:    Type:  Abscess   Location:  Trunk   Trunk location:  Abdomen Pre-procedure details:    Skin preparation:  Betadine Anesthesia (see MAR for exact dosages):    Anesthesia method:  Local infiltration   Local anesthetic:  Lidocaine 2% WITH epi Procedure details:    Incision types:  Cruciate   Scalpel blade:  11   Wound management:  Probed and deloculated   Drainage:  Purulent   Drainage amount:  Moderate   Wound treatment:  Wound left open   Packing materials:  None   (including critical care time)  Medications Ordered in ED Medications  lidocaine-EPINEPHrine (XYLOCAINE W/EPI) 2 %-1:200000 (PF) injection 10 mL (10 mLs Infiltration Given by Other 10/09/18 0750)  doxycycline (VIBRA-TABS) tablet 100 mg (100 mg Oral Given 10/09/18 0751)     Initial Impression / Assessment and Plan / ED Course  I have reviewed the triage vital signs and the nursing notes.  Pertinent labs & imaging results that were available during my care of the patient were reviewed by me and considered in my medical decision making (see chart for details).  Pt d/c home with doxy.  She is instructed to return if abscess worsens.  F/u with pcp.  Final Clinical Impressions(s) / ED Diagnoses   Final diagnoses:  Abscess    ED Discharge Orders         Ordered    doxycycline (VIBRAMYCIN) 100 MG capsule  2 times daily     10/09/18 0813    HYDROcodone-acetaminophen (NORCO/VICODIN) 5-325 MG tablet  Every 4 hours PRN     10/09/18 [id number]           Isla Pence, MD 10/09/18 [phone]

## 2018-10-09 NOTE — ED Triage Notes (Signed)
Abscess to right upper quad on abdomen for one week.  No fevers at home.

## 2019-05-21 ENCOUNTER — Encounter: Payer: Self-pay | Admitting: Family

## 2019-05-21 ENCOUNTER — Ambulatory Visit (INDEPENDENT_AMBULATORY_CARE_PROVIDER_SITE_OTHER): Payer: 59 | Admitting: Family

## 2019-05-21 ENCOUNTER — Other Ambulatory Visit: Payer: Self-pay

## 2019-05-21 VITALS — BP 166/116 | HR 85 | Temp 97.6°F | Resp 16 | Ht 62.0 in | Wt 291.0 lb

## 2019-05-21 DIAGNOSIS — G43809 Other migraine, not intractable, without status migrainosus: Secondary | ICD-10-CM

## 2019-05-21 DIAGNOSIS — F419 Anxiety disorder, unspecified: Secondary | ICD-10-CM

## 2019-05-21 DIAGNOSIS — E039 Hypothyroidism, unspecified: Secondary | ICD-10-CM

## 2019-05-21 DIAGNOSIS — I10 Essential (primary) hypertension: Secondary | ICD-10-CM

## 2019-05-21 DIAGNOSIS — O2230 Deep phlebothrombosis in pregnancy, unspecified trimester: Secondary | ICD-10-CM

## 2019-05-21 DIAGNOSIS — E559 Vitamin D deficiency, unspecified: Secondary | ICD-10-CM

## 2019-05-21 DIAGNOSIS — G4733 Obstructive sleep apnea (adult) (pediatric): Secondary | ICD-10-CM | POA: Diagnosis not present

## 2019-05-21 DIAGNOSIS — F329 Major depressive disorder, single episode, unspecified: Secondary | ICD-10-CM

## 2019-05-21 DIAGNOSIS — F32A Depression, unspecified: Secondary | ICD-10-CM

## 2019-05-21 MED ORDER — LISINOPRIL 10 MG PO TABS: 10.0000 mg | ORAL_TABLET | Freq: Every day | ORAL | 0 refills | Status: DC

## 2019-05-21 MED ORDER — SERTRALINE HCL 50 MG PO TABS: ORAL_TABLET | ORAL | 0 refills | Status: DC

## 2019-05-21 MED ORDER — AMLODIPINE BESYLATE 5 MG PO TABS: 5.0000 mg | ORAL_TABLET | Freq: Every day | ORAL | 0 refills | Status: DC

## 2019-05-21 NOTE — Progress Notes (Signed)
Subjective:    Patient ID: Sharon Rivers, female    DOB: 09-Jun-1970, 49 y.o.   MRN: [id number]  HPI  Patient presents today to re-establish care.   HTN- previously on lisinopril 20mg  as well as amlodipine.   BP Readings from Last 3 Encounters:  05/21/19 (!) 166/116  10/09/18 (!) 156/104  06/09/18 (!) 206/119   Depression/anxiety- reports that her major stressors are bills etc., pandemic.  Reports that she does her best.  Score 19 on PHQ9 and 15 on GAD 7.  Reports no active SI.    Hx of DVT (05/30/17)- no recent issues.  The patient decided to discontinue Xarelto on her own after several months.  OSA- reports that she is no longer using cpap.  She states that the mask was very uncomfortable and she could not tolerate it.  Declines referral at this time to sleep specialist.  Migraines- has seen neuro in the past. Reports that she got a piercing in her ear that is helping. Still having some minor HA's.  Review of Systems   See HPI  Past Medical History:  Diagnosis Date  . Anxiety   . Back pain   . Chest pain    a. 07/2015 Myoview: EF 71%, medium size, mild intensity, partially reversible septal defect with overlying breast attenuation -> likely artifact. No significant reversible ischemia.  . Constipation   . Depression   . Edema    feet and legs  . Frequent headaches   . GERD (gastroesophageal reflux disease)   . Hypertension   . Hypothyroidism   . Insulin resistance   . Joint pain   . Migraines   . OSA (obstructive sleep apnea) 03/17/2016  . Osteoarthritis   . Panic anxiety syndrome   . SOB (shortness of breath)    a. 04/2016 Echo: EF 60-65%, Gr1 DD.  [name] Swallowing difficulty      Social History   Socioeconomic History  . Marital status: Single    Spouse name: Not on file  . Number of children: Not on file  . Years of education: Not on file  . Highest education level: Not on file  Occupational History  . Occupation: Assembler    Comment: [employer]  Tobacco  Use  . Smoking status: Former Smoker    Types: Cigarettes  . Smokeless tobacco: Never Used  . Tobacco comment: quit 1992  Substance and Sexual Activity  . Alcohol use: No    Alcohol/week: 0.0 standard drinks  . Drug use: No  . Sexual activity: Yes    Partners: Female  Other Topics Concern  . Not on file  Social History Narrative   She has 2 children (grown). She did not retain custody of these children.   Lives with boyfriend in [place].   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 Difficulty of Paying Living Expenses:   Food Insecurity:   . Worried About Charity fundraiser in the Last Year:   . Arboriculturist in the Last Year:   Transportation Needs:   . Film/video editor (Medical):   [name] Lack of Transportation (Non-Medical):   Physical Activity:   . Days of Exercise per Week:   . Minutes of Exercise per Session:   Stress:   . Feeling of Stress :   Social Connections:   . Frequency of Communication with Friends and Family:   . Frequency of Social  Gatherings with Friends and Family:   . Attends Religious Services:   . Active Member of Clubs or Organizations:   . Attends Archivist Meetings:   [name] Marital Status:   Intimate Partner Violence:   . Fear of Current or Ex-Partner:   . Emotionally Abused:   [name] Physically Abused:   . Sexually Abused:     Past Surgical History:  Procedure Laterality Date  . CESAREAN SECTION  2001 & 2002  . ELBOW SURGERY    . MINOR CARPAL TUNNEL     pinched nerve in elbow  . WISDOM TOOTH EXTRACTION      Family History  Problem Relation Age of Onset  . Hypertension Mother        Living  . Arthritis Mother   . Thyroid disease Mother   . Heart disease Mother        MI at age 64  . Heart attack Mother   . Migraines Mother   . Depression Mother   . Obesity Mother   . Hypertension Maternal Grandmother   . Parkinson's disease Maternal  Grandmother   . Alzheimer's disease Maternal Grandmother   . Cancer Maternal Grandfather   . Hypertension Maternal Grandfather   . Hypertension Paternal Grandmother   . Hypertension Paternal Grandfather   . Gout Brother   . ADD / ADHD Son        x1  . Other Son        #2-Unknown  . Diabetes Neg Hx     Allergies  Allergen Reactions  . Adhesive [Tape] Other (See Comments)    Skin Burn.  [name] [Menthol (Topical Analgesic)] Other (See Comments)    Skin Burn.  . Sulfa Antibiotics Rash    No current outpatient medications on file prior to visit.   No current facility-administered medications on file prior to visit.    BP (!) 166/116 (BP Location: Right Arm, Patient Position: Sitting, Cuff Size: Large)   Pulse 85   Temp 97.6 F (36.4 C) (Temporal)   Resp 16   Ht 5\' 2"  (1.575 m)   Wt 291 lb (132 kg)   SpO2 100%   BMI 53.22 kg/m        Objective:   Physical Exam Constitutional:      Appearance: She is well-developed.  Neck:     Thyroid: No thyromegaly.  Cardiovascular:     Rate and Rhythm: Normal rate and regular rhythm.     Heart sounds: Normal heart sounds. No murmur.  Pulmonary:     Effort: Pulmonary effort is normal. No respiratory distress.     Breath sounds: Normal breath sounds. No wheezing.  Musculoskeletal:     Cervical back: Neck supple.  Skin:    General: Skin is warm and dry.  Neurological:     Mental Status: She is alert and oriented to person, place, and time.  Psychiatric:        Behavior: Behavior normal.        Thought Content: Thought content normal.        Judgment: Judgment normal.           Assessment & Plan:  Anxiety/depression-uncontrolled.  Will begin sertraline 50 mg half tablet once daily for 1 week then increase to a full tablet once daily on week 2.   Hypertension-uncontrolled.  Will restart amlodipine and lisinopril at decreased doses.  History of DVT-no recurrent symptoms of DVT.  Will monitor.  Obstructive sleep  apnea-declines further treatment or work-up  at this time.  We did discuss importance of weight loss.  Migraines-currently stable.  Will monitor.  Vitamin D deficiency-will obtain follow-up vitamin D level.  This visit occurred during the SARS-CoV-2 public health emergency.  Safety protocols were in place, including screening questions prior to the visit, additional usage of staff PPE, and extensive cleaning of exam room while observing appropriate contact time as indicated for disinfecting solutions.

## 2019-05-21 NOTE — Patient Instructions (Signed)
Please restart lisinopril, amlodipine. Begin sertraline 50mg  1/2 tab once daily for 1 week then increase to a full tab once daily on week two.

## 2019-05-22 LAB — COMPREHENSIVE METABOLIC PANEL
ALT: 19 U/L (ref 0–35)
AST: 18 U/L (ref 0–37)
Albumin: 4 g/dL (ref 3.5–5.2)
Alkaline Phosphatase: 65 U/L (ref 39–117)
BUN: 8 mg/dL (ref 6–23)
CO2: 29 mEq/L (ref 19–32)
Calcium: 8.8 mg/dL (ref 8.4–10.5)
Chloride: 103 mEq/L (ref 96–112)
Creatinine, Ser: 0.82 mg/dL (ref 0.40–1.20)
GFR: 74.57 mL/min (ref >60.00)
Glucose, Bld: 87 mg/dL (ref 70–99)
Potassium: 4.1 mEq/L (ref 3.5–5.1)
Sodium: 138 mEq/L (ref 135–145)
Total Bilirubin: 0.6 mg/dL (ref 0.2–1.2)
Total Protein: 7.3 g/dL (ref 6.0–8.3)

## 2019-05-22 LAB — TSH: TSH: 4.56 u[IU]/mL — ABNORMAL HIGH (ref 0.35–4.50)

## 2019-05-23 ENCOUNTER — Telehealth: Payer: Self-pay | Admitting: Family

## 2019-05-23 ENCOUNTER — Encounter: Payer: Self-pay | Admitting: Family

## 2019-05-23 MED ORDER — LEVOTHYROXINE SODIUM 50 MCG PO TABS: 50.0000 ug | ORAL_TABLET | Freq: Every day | ORAL | 0 refills | Status: DC

## 2019-05-23 NOTE — Telephone Encounter (Signed)
Lvm for patient to call back about her lab results 

## 2019-05-23 NOTE — Telephone Encounter (Signed)
Reviewed labs. She needs to restart synthroid. I will start her on 26mcg once daily.

## 2019-05-24 LAB — VITAMIN D 1,25 DIHYDROXY
Vitamin D 1, 25 (OH)2 Total: 70 pg/mL (ref 18–72)
Vitamin D2 1, 25 (OH)2: 30 pg/mL
Vitamin D3 1, 25 (OH)2: 40 pg/mL

## 2019-05-24 NOTE — Telephone Encounter (Signed)
Patient advised of results and prescription. TSH will be recheck on one month follow up appointment.

## 2019-05-27 ENCOUNTER — Encounter: Payer: Self-pay | Admitting: Family

## 2019-06-17 ENCOUNTER — Other Ambulatory Visit (HOSPITAL_COMMUNITY)
Admission: RE | Admit: 2019-06-17 | Discharge: 2019-06-17 | Disposition: A | Discharge status: Home / Self Care | Payer: 59 | Source: Ambulatory Visit | Attending: Family | Admitting: Family

## 2019-06-17 ENCOUNTER — Ambulatory Visit (INDEPENDENT_AMBULATORY_CARE_PROVIDER_SITE_OTHER): Payer: 59 | Admitting: Family

## 2019-06-17 ENCOUNTER — Ambulatory Visit: Payer: 59 | Admitting: Family

## 2019-06-17 ENCOUNTER — Other Ambulatory Visit: Payer: Self-pay

## 2019-06-17 VITALS — BP 142/85 | HR 79 | Temp 97.3°F | Resp 16 | Wt 282.0 lb

## 2019-06-17 DIAGNOSIS — R8290 Unspecified abnormal findings in urine: Secondary | ICD-10-CM | POA: Insufficient documentation

## 2019-06-17 DIAGNOSIS — E039 Hypothyroidism, unspecified: Secondary | ICD-10-CM

## 2019-06-17 DIAGNOSIS — I10 Essential (primary) hypertension: Secondary | ICD-10-CM

## 2019-06-17 DIAGNOSIS — F3289 Other specified depressive episodes: Secondary | ICD-10-CM | POA: Diagnosis not present

## 2019-06-17 DIAGNOSIS — J309 Allergic rhinitis, unspecified: Secondary | ICD-10-CM

## 2019-06-17 DIAGNOSIS — F419 Anxiety disorder, unspecified: Secondary | ICD-10-CM

## 2019-06-17 MED ORDER — TRAZODONE HCL 50 MG PO TABS: ORAL_TABLET | ORAL | 3 refills | Status: DC

## 2019-06-17 MED ORDER — MONTELUKAST SODIUM 10 MG PO TABS: 10.0000 mg | ORAL_TABLET | Freq: Every day | ORAL | 3 refills | Status: DC

## 2019-06-17 MED ORDER — SERTRALINE HCL 100 MG PO TABS: 100.0000 mg | ORAL_TABLET | Freq: Every day | ORAL | 3 refills | Status: DC

## 2019-06-17 NOTE — Patient Instructions (Addendum)
Please call [facility] [phone]. Increase zoloft from 50mg  to 100mg  once daily. You may use trazodone 1/2-1 tab once daily as needed for sleep. Call 911 if you develop thoughts of hurting yourself. Complete lab work prior to leaving.

## 2019-06-17 NOTE — Progress Notes (Signed)
Subjective:    Patient ID: Sharon Rivers, female    DOB: 11/28/1970, 50 y.o.   MRN: [id number]  HPI   Patient is a 49 yr old female who presents today for follow up.   HTN- last visit we restarted amlodipine/lisinopril.  BP Readings from Last 3 Encounters:  06/17/19 (!) 142/85  05/21/19 (!) 166/116  10/09/18 (!) 156/104   Depression/anxiety- last visit we started sertraline 50 mg. Reports that when she first started back on medication she felt sleepy but then she returned to baseline. Has trouble falling asleep at night "my brain won't shut off."  Feels worthlessness, feels like she is not good enough.    Allergic rhinitis- taking an otc allergy tab. Flonase. Still having symptoms.   Hypothyroid- she is back on synthroid.  Lab Results  Component Value Date   TSH 4.56 (H) 05/21/2019   Notes odd urinary odor.  X 1 month.    She also complains of "something in my skin" on the tip of her thumb.   Review of Systems Past Medical History:  Diagnosis Date  . Anxiety   . Back pain   . Chest pain    a. 07/2015 Myoview: EF 71%, medium size, mild intensity, partially reversible septal defect with overlying breast attenuation -> likely artifact. No significant reversible ischemia.  . Constipation   . Depression   . Edema    feet and legs  . Frequent headaches   . GERD (gastroesophageal reflux disease)   . History of DVT (deep vein thrombosis) 2018  . Hypertension   . Hypothyroidism   . Insulin resistance   . Joint pain   . Migraines   . OSA (obstructive sleep apnea) 03/17/2016  . Osteoarthritis   . Panic anxiety syndrome   . SOB (shortness of breath)    a. 04/2016 Echo: EF 60-65%, Gr1 DD.  [name] Swallowing difficulty      Social History   Socioeconomic History  . Marital status: Single    Spouse name: Not on file  . Number of children: Not on file  . Years of education: Not on file  . Highest education level: Not on file  Occupational History  . Occupation: Assembler   Comment: [employer]  Tobacco Use  . Smoking status: Former Smoker    Types: Cigarettes  . Smokeless tobacco: Never Used  . Tobacco comment: quit 1992  Substance and Sexual Activity  . Alcohol use: No    Alcohol/week: 0.0 standard drinks  . Drug use: No  . Sexual activity: Yes    Partners: Female  Other Topics Concern  . Not on file  Social History Narrative   She has 2 children (grown). She did not retain custody of these children.   Lives with boyfriend in [place].   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 Difficulty of Paying Living Expenses:   Food Insecurity:   . Worried About Charity fundraiser in the Last Year:   . Arboriculturist in the Last Year:   Transportation Needs:   . Film/video editor (Medical):   [name] Lack of Transportation (Non-Medical):   Physical Activity:   . Days of Exercise per Week:   . Minutes of Exercise per Session:   Stress:   . Feeling of Stress :   Social Connections:   . Frequency of Communication with Friends and Family:   . Frequency  of Social Gatherings with Friends and Family:   . Attends Religious Services:   . Active Member of Clubs or Organizations:   . Attends Archivist Meetings:   [name] Marital Status:   Intimate Partner Violence:   . Fear of Current or Ex-Partner:   . Emotionally Abused:   [name] Physically Abused:   . Sexually Abused:     Past Surgical History:  Procedure Laterality Date  . CESAREAN SECTION  2001 & 2002  . ELBOW SURGERY    . MINOR CARPAL TUNNEL     pinched nerve in elbow  . WISDOM TOOTH EXTRACTION      Family History  Problem Relation Age of Onset  . Hypertension Mother        Living  . Arthritis Mother   . Thyroid disease Mother   . Heart disease Mother        MI at age 29  . Heart attack Mother   . Migraines Mother   . Depression Mother   . Obesity Mother   . Hypertension Maternal Grandmother   .  Parkinson's disease Maternal Grandmother   . Alzheimer's disease Maternal Grandmother   . Cancer Maternal Grandfather   . Hypertension Maternal Grandfather   . Hypertension Paternal Grandmother   . Hypertension Paternal Grandfather   . Gout Brother   . ADD / ADHD Son        x1  . Other Son        #2-Unknown  . Diabetes Neg Hx     Allergies  Allergen Reactions  . Adhesive [Tape] Other (See Comments)    Skin Burn.  [name] [Menthol (Topical Analgesic)] Other (See Comments)    Skin Burn.  . Sulfa Antibiotics Rash    Current Outpatient Medications on File Prior to Visit  Medication Sig Dispense Refill  . amLODipine (NORVASC) 5 MG tablet Take 1 tablet (5 mg total) by mouth daily. 90 tablet 0  . levothyroxine (SYNTHROID) 50 MCG tablet Take 1 tablet (50 mcg total) by mouth daily. 90 tablet 0  . lisinopril (ZESTRIL) 10 MG tablet Take 1 tablet (10 mg total) by mouth daily. 90 tablet 0  . sertraline (ZOLOFT) 50 MG tablet 1/2 tab by mouth once daily at bedtime for 1 week, then increase to a full tab once daily on week two. 30 tablet 0   No current facility-administered medications on file prior to visit.    BP (!) 142/85 (BP Location: Right Arm, Patient Position: Sitting, Cuff Size: Large)   Pulse 79   Temp (!) 97.3 F (36.3 C) (Temporal)   Resp 16   Wt 282 lb (127.9 kg)   SpO2 100%   BMI 51.58 kg/m       Objective:   Physical Exam Constitutional:      Appearance: She is well-developed.  Neck:     Thyroid: No thyromegaly.  Cardiovascular:     Rate and Rhythm: Normal rate and regular rhythm.     Heart sounds: Normal heart sounds. No murmur.  Pulmonary:     Effort: Pulmonary effort is normal. No respiratory distress.     Breath sounds: Normal breath sounds. No wheezing.  Musculoskeletal:     Cervical back: Neck supple.  Skin:    General: Skin is warm and dry.     Comments: Small wart noted on the tip of the right thumb  Neurological:     Mental Status: She is  alert and oriented to person, place, and time.  Psychiatric:        Behavior: Behavior normal.        Thought Content: Thought content normal.        Judgment: Judgment normal.           Assessment & Plan:  HTN-  BP is improved, continue current meds.  Skin wart- after verbal consent, wart was treated with liquid nitrogen. Pt tolerated procedure well.  Urinary odor- check urine culture.  Depression/anxiety- uncontrolled. Will increase zoloft from 50mg  to 100mg .  Recommended that she call to establish with a therapist.  Allergic rhinitis- uncontrolled on current regimen. Add trial of singulair.  Hypothyroid- too soon to recheck tsh, continue synthroid.   This visit occurred during the SARS-CoV-2 public health emergency.  Safety protocols were in place, including screening questions prior to the visit, additional usage of staff PPE, and extensive cleaning of exam room while observing appropriate contact time as indicated for disinfecting solutions.

## 2019-06-19 ENCOUNTER — Encounter: Payer: Self-pay | Admitting: Family

## 2019-06-19 ENCOUNTER — Telehealth: Payer: Self-pay | Admitting: Family

## 2019-06-19 LAB — URINE CULTURE
MICRO NUMBER:: 10458668
SPECIMEN QUALITY:: ADEQUATE

## 2019-06-19 MED ORDER — CEPHALEXIN 500 MG PO CAPS: 500.0000 mg | ORAL_CAPSULE | Freq: Three times a day (TID) | ORAL | 0 refills | Status: AC

## 2019-06-19 NOTE — Telephone Encounter (Signed)
Please confirm that pt has read the mychart message in AM and if not, please call her.

## 2019-06-20 NOTE — Telephone Encounter (Signed)
Melissa sent patient a message yesterday and patient confirmed receiving.

## 2019-06-21 LAB — URINE CYTOLOGY ANCILLARY ONLY
Bacterial Vaginitis-Urine: POSITIVE — AB
Bacterial Vaginitis-Urine: POSITIVE — AB

## 2019-06-21 MED ORDER — METRONIDAZOLE 0.75 % VA GEL: 1.0000 | Freq: Every day | VAGINAL | 0 refills | Status: AC

## 2019-06-21 NOTE — Telephone Encounter (Signed)
Urine ancillary testing noted + bacterial vaginosis.  A common bacterial infection. I have sent rx for metrogel to her pharmacy to treat this. She should also complete the keflex for UTI.

## 2019-06-21 NOTE — Telephone Encounter (Signed)
Lvm for patient to call back and confirmed she received this information.

## 2019-06-24 NOTE — Telephone Encounter (Signed)
Patient confirmed she received the information

## 2019-06-28 ENCOUNTER — Encounter: Payer: Self-pay | Admitting: Family

## 2019-07-01 MED ORDER — ALPRAZOLAM 0.25 MG PO TABS: 0.2500 mg | ORAL_TABLET | Freq: Two times a day (BID) | ORAL | 0 refills | Status: DC | PRN

## 2019-07-22 ENCOUNTER — Other Ambulatory Visit: Payer: Self-pay

## 2019-07-22 ENCOUNTER — Encounter: Payer: Self-pay | Admitting: Family

## 2019-07-22 ENCOUNTER — Ambulatory Visit (INDEPENDENT_AMBULATORY_CARE_PROVIDER_SITE_OTHER): Payer: 59 | Admitting: Family

## 2019-07-22 VITALS — BP 132/76 | HR 70 | Temp 96.2°F | Resp 18 | Ht 62.0 in | Wt 284.0 lb

## 2019-07-22 DIAGNOSIS — Z0000 Encounter for general adult medical examination without abnormal findings: Secondary | ICD-10-CM

## 2019-07-22 DIAGNOSIS — F418 Other specified anxiety disorders: Secondary | ICD-10-CM

## 2019-07-22 DIAGNOSIS — I10 Essential (primary) hypertension: Secondary | ICD-10-CM | POA: Diagnosis not present

## 2019-07-22 DIAGNOSIS — E8881 Metabolic syndrome: Secondary | ICD-10-CM | POA: Diagnosis not present

## 2019-07-22 DIAGNOSIS — F419 Anxiety disorder, unspecified: Secondary | ICD-10-CM | POA: Diagnosis not present

## 2019-07-22 DIAGNOSIS — E039 Hypothyroidism, unspecified: Secondary | ICD-10-CM

## 2019-07-22 DIAGNOSIS — J309 Allergic rhinitis, unspecified: Secondary | ICD-10-CM

## 2019-07-22 LAB — HEMOGLOBIN A1C: Hgb A1c MFr Bld: 5.2 % (ref 4.6–6.5)

## 2019-07-22 LAB — TSH: TSH: 5.51 u[IU]/mL — ABNORMAL HIGH (ref 0.35–4.50)

## 2019-07-22 MED ORDER — ALPRAZOLAM 0.25 MG PO TABS: 0.2500 mg | ORAL_TABLET | Freq: Two times a day (BID) | ORAL | 0 refills | Status: DC | PRN

## 2019-07-22 NOTE — Patient Instructions (Signed)
Please complete lab work prior to leaving.   

## 2019-07-22 NOTE — Progress Notes (Signed)
Subjective:    Patient ID: Sharon Rivers, female    DOB: [birth year], 49 y.o.   MRN: [id number]  HPI  Patient is a 49 yr old female who presents today for follow up.  HTN- maintained on amlodipine 5mg , lisinopril 10mg .   BP Readings from Last 3 Encounters:  07/22/19 132/76  06/17/19 (!) 142/85  05/21/19 (!) 166/116   Depression/Anxiety- last visit we increased her zoloft from 50mg  to 100mg  once daily. She also complained of insomnia- she was treated with trazodone. She reports that she was able to concentrate better and sleep better with zanax.   Wt Readings from Last 3 Encounters:  07/22/19 284 lb (128.8 kg)  06/17/19 282 lb (127.9 kg)  05/21/19 291 lb (132 kg)   Allergic rhinitis- symptoms were uncontrolled last visit and we gave her a trial of singulair. Reports significant improvement with addition of singulair.   Hypothyroid- due for follow up tsh today.  Lab Results  Component Value Date   TSH 4.56 (H) 05/21/2019      Review of Systems See HPI  Past Medical History:  Diagnosis Date  . Anxiety   . Back pain   . Chest pain    a. 07/2015 Myoview: EF 71%, medium size, mild intensity, partially reversible septal defect with overlying breast attenuation -> likely artifact. No significant reversible ischemia.  . Constipation   . Depression   . Edema    feet and legs  . Frequent headaches   . GERD (gastroesophageal reflux disease)   . History of DVT (deep vein thrombosis) 2018  . Hypertension   . Hypothyroidism   . Insulin resistance   . Joint pain   . Migraines   . OSA (obstructive sleep apnea) 03/17/2016  . Osteoarthritis   . Panic anxiety syndrome   . SOB (shortness of breath)    a. 04/2016 Echo: EF 60-65%, Gr1 DD.  [name] Swallowing difficulty      Social History   Socioeconomic History  . Marital status: Single    Spouse name: Not on file  . Number of children: Not on file  . Years of education: Not on file  . Highest education level: Not on file    Occupational History  . Occupation: Assembler    Comment: [employer]  Tobacco Use  . Smoking status: Former Smoker    Types: Cigarettes  . Smokeless tobacco: Never Used  . Tobacco comment: quit 1992  Substance and Sexual Activity  . Alcohol use: No    Alcohol/week: 0.0 standard drinks  . Drug use: No  . Sexual activity: Yes    Partners: Female  Other Topics Concern  . Not on file  Social History Narrative   She has 2 children (grown). She did not retain custody of these children.   Lives with boyfriend in [place].   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 Difficulty of Paying Living Expenses:   Food Insecurity:   . Worried About Charity fundraiser in the Last Year:   . Arboriculturist in the Last Year:   Transportation Needs:   . Film/video editor (Medical):   [name] Lack of Transportation (Non-Medical):   Physical Activity:   . Days of Exercise per Week:   . Minutes of Exercise per Session:   Stress:   . Feeling of Stress :   Social Connections:   . Frequency of Communication  with Friends and Family:   . Frequency of Social Gatherings with Friends and Family:   . Attends Religious Services:   . Active Member of Clubs or Organizations:   . Attends Archivist Meetings:   [name] Marital Status:   Intimate Partner Violence:   . Fear of Current or Ex-Partner:   . Emotionally Abused:   [name] Physically Abused:   . Sexually Abused:     Past Surgical History:  Procedure Laterality Date  . CESAREAN SECTION  2001 & 2002  . ELBOW SURGERY    . MINOR CARPAL TUNNEL     pinched nerve in elbow  . WISDOM TOOTH EXTRACTION      Family History  Problem Relation Age of Onset  . Hypertension Mother        Living  . Arthritis Mother   . Thyroid disease Mother   . Heart disease Mother        MI at age 39  . Heart attack Mother   . Migraines Mother   . Depression Mother   . Obesity  Mother   . Hypertension Maternal Grandmother   . Parkinson's disease Maternal Grandmother   . Alzheimer's disease Maternal Grandmother   . Cancer Maternal Grandfather   . Hypertension Maternal Grandfather   . Hypertension Paternal Grandmother   . Hypertension Paternal Grandfather   . Gout Brother   . ADD / ADHD Son        x1  . Other Son        #2-Unknown  . Diabetes Neg Hx     Allergies  Allergen Reactions  . Adhesive [Tape] Other (See Comments)    Skin Burn.  [name] [Menthol (Topical Analgesic)] Other (See Comments)    Skin Burn.  . Sulfa Antibiotics Rash    Current Outpatient Medications on File Prior to Visit  Medication Sig Dispense Refill  . amLODipine (NORVASC) 5 MG tablet Take 1 tablet (5 mg total) by mouth daily. 90 tablet 0  . levothyroxine (SYNTHROID) 50 MCG tablet Take 1 tablet (50 mcg total) by mouth daily. 90 tablet 0  . lisinopril (ZESTRIL) 10 MG tablet Take 1 tablet (10 mg total) by mouth daily. 90 tablet 0  . montelukast (SINGULAIR) 10 MG tablet Take 1 tablet (10 mg total) by mouth at bedtime. 30 tablet 3  . sertraline (ZOLOFT) 100 MG tablet Take 1 tablet (100 mg total) by mouth daily. 30 tablet 3  . traZODone (DESYREL) 50 MG tablet 1/2-1 tablet by mouth at bedtime as needed for sleep 30 tablet 3  . ALPRAZolam (XANAX) 0.25 MG tablet Take 1 tablet (0.25 mg total) by mouth 2 (two) times daily as needed for anxiety. (Patient not taking: Reported on 07/22/2019) 10 tablet 0   No current facility-administered medications on file prior to visit.    BP 132/76 (BP Location: Left Arm, Patient Position: Sitting, Cuff Size: Normal)   Pulse 70   Temp (!) 96.2 F (35.7 C) (Temporal)   Resp 18   Ht 5\' 2"  (1.575 m)   Wt 284 lb (128.8 kg)   SpO2 97%   BMI 51.94 kg/m       Objective:   Physical Exam Constitutional:      Appearance: She is well-developed.  Cardiovascular:     Rate and Rhythm: Normal rate and regular rhythm.     Heart sounds: Normal heart  sounds. No murmur heard.   Pulmonary:     Effort: Pulmonary effort is normal. No respiratory distress.  Breath sounds: Normal breath sounds. No wheezing.  Psychiatric:        Behavior: Behavior normal.        Thought Content: Thought content normal.        Judgment: Judgment normal.           Assessment & Plan:  HTN- bp is improved. Continue current meds.   Insulin resistance- will obtain A1C. Reports that she has been on metformin previously.  Allergic rhinitis- stable/improved with singulair, continue same.  Hypothyroid- obtain follow up tsh.  Continue synthroid.   Depression/anxiety- improved. Wishes to continue prn xanax.  A controlled substance contract is signed and UDS will be obtained. Continue current dose of zoloft.  This visit occurred during the SARS-CoV-2 public health emergency.  Safety protocols were in place, including screening questions prior to the visit, additional usage of staff PPE, and extensive cleaning of exam room while observing appropriate contact time as indicated for disinfecting solutions.

## 2019-07-23 LAB — DRUG MONITORING, PANEL 8 WITH CONFIRMATION, URINE
6 Acetylmorphine: NEGATIVE ng/mL (ref <10)
Alcohol Metabolites: NEGATIVE ng/mL
Amphetamines: NEGATIVE ng/mL (ref <500)
Benzodiazepines: NEGATIVE ng/mL (ref <100)
Buprenorphine, Urine: NEGATIVE ng/mL (ref <5)
Cocaine Metabolite: NEGATIVE ng/mL (ref <150)
Creatinine: 85.5 mg/dL
MDMA: NEGATIVE ng/mL (ref <500)
Marijuana Metabolite: NEGATIVE ng/mL (ref <20)
Opiates: NEGATIVE ng/mL (ref <100)
Oxidant: NEGATIVE ug/mL
Oxycodone: NEGATIVE ng/mL (ref <100)
pH: 6.4 (ref 4.5–9.0)

## 2019-07-23 LAB — DM TEMPLATE

## 2019-07-25 ENCOUNTER — Other Ambulatory Visit: Payer: Self-pay | Admitting: Family

## 2019-07-25 NOTE — Telephone Encounter (Signed)
Please contact patient and let her know that I reviewed her lab work and it looks like her thyroid medicine needs to be increased.  Please increase synthroid from 50 mcg to 75 mcg.   Repeat TSH in 6 weeks.

## 2019-07-29 ENCOUNTER — Other Ambulatory Visit: Payer: Self-pay

## 2019-07-29 DIAGNOSIS — E039 Hypothyroidism, unspecified: Secondary | ICD-10-CM

## 2019-07-29 MED ORDER — LEVOTHYROXINE SODIUM 75 MCG PO TABS: 75.0000 ug | ORAL_TABLET | Freq: Every day | ORAL | 3 refills | Status: DC

## 2019-07-29 NOTE — Telephone Encounter (Signed)
Patient advised of medication increase

## 2019-08-05 ENCOUNTER — Other Ambulatory Visit: Payer: Self-pay

## 2019-08-05 ENCOUNTER — Encounter (HOSPITAL_BASED_OUTPATIENT_CLINIC_OR_DEPARTMENT_OTHER): Payer: Self-pay

## 2019-08-05 ENCOUNTER — Ambulatory Visit (HOSPITAL_BASED_OUTPATIENT_CLINIC_OR_DEPARTMENT_OTHER)
Admission: RE | Admit: 2019-08-05 | Discharge: 2019-08-05 | Disposition: A | Discharge status: Home / Self Care | Payer: 59 | Source: Ambulatory Visit | Attending: Family | Admitting: Family

## 2019-08-05 DIAGNOSIS — Z1231 Encounter for screening mammogram for malignant neoplasm of breast: Secondary | ICD-10-CM | POA: Insufficient documentation

## 2019-08-05 DIAGNOSIS — Z0000 Encounter for general adult medical examination without abnormal findings: Secondary | ICD-10-CM

## 2019-08-13 ENCOUNTER — Other Ambulatory Visit: Payer: Self-pay | Admitting: Family

## 2019-08-14 ENCOUNTER — Encounter: Payer: 59 | Admitting: Family

## 2019-08-19 ENCOUNTER — Encounter: Payer: 59 | Admitting: Family

## 2019-09-02 ENCOUNTER — Other Ambulatory Visit: Payer: Self-pay

## 2019-09-02 ENCOUNTER — Other Ambulatory Visit (HOSPITAL_COMMUNITY)
Admission: RE | Admit: 2019-09-02 | Discharge: 2019-09-02 | Disposition: A | Discharge status: Home / Self Care | Payer: 59 | Source: Ambulatory Visit | Attending: Family | Admitting: Family

## 2019-09-02 ENCOUNTER — Encounter: Payer: Self-pay | Admitting: Family

## 2019-09-02 ENCOUNTER — Ambulatory Visit (INDEPENDENT_AMBULATORY_CARE_PROVIDER_SITE_OTHER): Payer: 59 | Admitting: Family

## 2019-09-02 VITALS — BP 142/88 | HR 79 | Temp 98.6°F | Resp 16 | Ht 62.0 in | Wt 284.0 lb

## 2019-09-02 DIAGNOSIS — Z01419 Encounter for gynecological examination (general) (routine) without abnormal findings: Secondary | ICD-10-CM

## 2019-09-02 DIAGNOSIS — E039 Hypothyroidism, unspecified: Secondary | ICD-10-CM | POA: Diagnosis not present

## 2019-09-02 DIAGNOSIS — Z0000 Encounter for general adult medical examination without abnormal findings: Secondary | ICD-10-CM | POA: Diagnosis not present

## 2019-09-02 DIAGNOSIS — R232 Flushing: Secondary | ICD-10-CM

## 2019-09-02 DIAGNOSIS — I10 Essential (primary) hypertension: Secondary | ICD-10-CM | POA: Diagnosis not present

## 2019-09-02 DIAGNOSIS — Z1159 Encounter for screening for other viral diseases: Secondary | ICD-10-CM

## 2019-09-02 LAB — BASIC METABOLIC PANEL
BUN: 13 mg/dL (ref 6–23)
CO2: 29 mEq/L (ref 19–32)
Calcium: 9 mg/dL (ref 8.4–10.5)
Chloride: 102 mEq/L (ref 96–112)
Creatinine, Ser: 0.76 mg/dL (ref 0.40–1.20)
GFR: 81.31 mL/min (ref >60.00)
Glucose, Bld: 95 mg/dL (ref 70–99)
Potassium: 4.3 mEq/L (ref 3.5–5.1)
Sodium: 135 mEq/L (ref 135–145)

## 2019-09-02 LAB — CBC WITH DIFFERENTIAL/PLATELET
Basophils Absolute: 0 10*3/uL (ref 0.0–0.1)
Basophils Relative: 0.7 % (ref 0.0–3.0)
Eosinophils Absolute: 0.2 10*3/uL (ref 0.0–0.7)
Eosinophils Relative: 3.7 % (ref 0.0–5.0)
HCT: 36.8 % (ref 36.0–46.0)
Hemoglobin: 12.4 g/dL (ref 12.0–15.0)
Lymphocytes Relative: 15.8 % (ref 12.0–46.0)
Lymphs Abs: 0.9 10*3/uL (ref 0.7–4.0)
MCHC: 33.7 g/dL (ref 30.0–36.0)
MCV: 85.7 fl (ref 78.0–100.0)
Monocytes Absolute: 0.4 10*3/uL (ref 0.1–1.0)
Monocytes Relative: 6.4 % (ref 3.0–12.0)
Neutro Abs: 4.3 10*3/uL (ref 1.4–7.7)
Neutrophils Relative %: 73.4 % (ref 43.0–77.0)
Platelets: 305 10*3/uL (ref 150.0–400.0)
RBC: 4.3 Mil/uL (ref 3.87–5.11)
RDW: 13.6 % (ref 11.5–15.5)
WBC: 5.9 10*3/uL (ref 4.0–10.5)

## 2019-09-02 LAB — LIPID PANEL
Cholesterol: 143 mg/dL (ref 0–200)
HDL: 40.6 mg/dL (ref >39.00)
LDL Cholesterol: 86 mg/dL (ref 0–99)
NonHDL: 102.49
Total CHOL/HDL Ratio: 4
Triglycerides: 81 mg/dL (ref 0.0–149.0)
VLDL: 16.2 mg/dL (ref 0.0–40.0)

## 2019-09-02 LAB — HEPATIC FUNCTION PANEL
ALT: 17 U/L (ref 0–35)
AST: 14 U/L (ref 0–37)
Albumin: 3.9 g/dL (ref 3.5–5.2)
Alkaline Phosphatase: 65 U/L (ref 39–117)
Bilirubin, Direct: 0.1 mg/dL (ref 0.0–0.3)
Total Bilirubin: 0.4 mg/dL (ref 0.2–1.2)
Total Protein: 6.9 g/dL (ref 6.0–8.3)

## 2019-09-02 LAB — TSH: TSH: 2.87 u[IU]/mL (ref 0.35–4.50)

## 2019-09-02 NOTE — Patient Instructions (Addendum)
Please schedule a routine dental exam.  Please complete lab work prior to leaving.   Preventive Care 4-48 Years Old, Female Preventive care refers to visits with your health care provider and lifestyle choices that can promote health and wellness. This includes:  A yearly physical exam. This may also be called an annual well check.  Regular dental visits and eye exams.  Immunizations.  Screening for certain conditions.  Healthy lifestyle choices, such as eating a healthy diet, getting regular exercise, not using drugs or products that contain nicotine and tobacco, and limiting alcohol use. What can I expect for my preventive care visit? Physical exam Your health care provider will check your:  Height and weight. This may be used to calculate body mass index (BMI), which tells if you are at a healthy weight.  Heart rate and blood pressure.  Skin for abnormal spots. Counseling Your health care provider may ask you questions about your:  Alcohol, tobacco, and drug use.  Emotional well-being.  Home and relationship well-being.  Sexual activity.  Eating habits.  Work and work Statistician.  Method of birth control.  Menstrual cycle.  Pregnancy history. What immunizations do I need?  Influenza (flu) vaccine  This is recommended every year. Tetanus, diphtheria, and pertussis (Tdap) vaccine  You may need a Td booster every 10 years. Varicella (chickenpox) vaccine  You may need this if you have not been vaccinated. Zoster (shingles) vaccine  You may need this after age 61. Measles, mumps, and rubella (MMR) vaccine  You may need at least one dose of MMR if you were born in 1957 or later. You may also need a second dose. Pneumococcal conjugate (PCV13) vaccine  You may need this if you have certain conditions and were not previously vaccinated. Pneumococcal polysaccharide (PPSV23) vaccine  You may need one or two doses if you smoke cigarettes or if you have  certain conditions. Meningococcal conjugate (MenACWY) vaccine  You may need this if you have certain conditions. Hepatitis A vaccine  You may need this if you have certain conditions or if you travel or work in places where you may be exposed to hepatitis A. Hepatitis B vaccine  You may need this if you have certain conditions or if you travel or work in places where you may be exposed to hepatitis B. Haemophilus influenzae type b (Hib) vaccine  You may need this if you have certain conditions. Human papillomavirus (HPV) vaccine  If recommended by your health care provider, you may need three doses over 6 months. You may receive vaccines as individual doses or as more than one vaccine together in one shot (combination vaccines). Talk with your health care provider about the risks and benefits of combination vaccines. What tests do I need? Blood tests  Lipid and cholesterol levels. These may be checked every 5 years, or more frequently if you are over 15 years old.  Hepatitis C test.  Hepatitis B test. Screening  Lung cancer screening. You may have this screening every year starting at age 67 if you have a 30-pack-year history of smoking and currently smoke or have quit within the past 15 years.  Colorectal cancer screening. All adults should have this screening starting at age 17 and continuing until age 55. Your health care provider may recommend screening at age 2 if you are at increased risk. You will have tests every 1-10 years, depending on your results and the type of screening test.  Diabetes screening. This is done by checking your  blood sugar (glucose) after you have not eaten for a while (fasting). You may have this done every 1-3 years.  Mammogram. This may be done every 1-2 years. Talk with your health care provider about when you should start having regular mammograms. This may depend on whether you have a family history of breast cancer.  BRCA-related cancer  screening. This may be done if you have a family history of breast, ovarian, tubal, or peritoneal cancers.  Pelvic exam and Pap test. This may be done every 3 years starting at age 59. Starting at age 65, this may be done every 5 years if you have a Pap test in combination with an HPV test. Other tests  Sexually transmitted disease (STD) testing.  Bone density scan. This is done to screen for osteoporosis. You may have this scan if you are at high risk for osteoporosis. Follow these instructions at home: Eating and drinking  Eat a diet that includes fresh fruits and vegetables, whole grains, lean protein, and low-fat dairy.  Take vitamin and mineral supplements as recommended by your health care provider.  Do not drink alcohol if: ? Your health care provider tells you not to drink. ? You are pregnant, may be pregnant, or are planning to become pregnant.  If you drink alcohol: ? Limit how much you have to 0-1 drink a day. ? Be aware of how much alcohol is in your drink. In the [place] one drink equals one 12 oz bottle of beer (355 mL), one 5 oz glass of wine (148 mL), or one 1 oz glass of hard liquor (44 mL). Lifestyle  Take daily care of your teeth and gums.  Stay active. Exercise for at least 30 minutes on 5 or more days each week.  Do not use any products that contain nicotine or tobacco, such as cigarettes, e-cigarettes, and chewing tobacco. If you need help quitting, ask your health care provider.  If you are sexually active, practice safe sex. Use a condom or other form of birth control (contraception) in order to prevent pregnancy and STIs (sexually transmitted infections).  If told by your health care provider, take low-dose aspirin daily starting at age 20. What's next?  Visit your health care provider once a year for a well check visit.  Ask your health care provider how often you should have your eyes and teeth checked.  Stay up to date on all vaccines. This  information is not intended to replace advice given to you by your health care provider. Make sure you discuss any questions you have with your health care provider. Document Revised: 10/05/2017 Document Reviewed: 10/05/2017 Elsevier Patient Education  2020 [employer].

## 2019-09-02 NOTE — Progress Notes (Signed)
Subjective:    Patient ID: Sharon Rivers, female    DOB: [birth year], 49 y.o.   MRN: [id number]  HPI  Patient presents today for complete physical.  Immunizations:  tdap 2018, declines flu shot, declines covid vaccine Diet: Tries to watch what she eats Wt Readings from Last 3 Encounters:  09/02/19 (!) 284 lb (128.8 kg)  07/22/19 284 lb (128.8 kg)  06/17/19 282 lb (127.9 kg)  Exercise: no formal exercise Pap Smear:  2016 Mammogram: 08/05/19 Vision:  Up to date Dental:  due    Review of Systems  Constitutional: Negative for unexpected weight change.  HENT: Positive for rhinorrhea. Negative for hearing loss.   Eyes: Negative for visual disturbance.  Respiratory: Negative for cough and shortness of breath.   Cardiovascular: Negative for chest pain.  Gastrointestinal: Negative for constipation and diarrhea.  Genitourinary: Positive for menstrual problem (periods are heavy and has a lot of cramping.).  Musculoskeletal: Positive for arthralgias (feet).  Skin: Positive for rash (just dry skin on her arms).  Neurological: Negative for headaches.  Hematological: Negative for adenopathy.  Psychiatric/Behavioral:       Reports that she recently lost her dad, mom has "given up." a lot of financial stressors   Past Medical History:  Diagnosis Date  . Anxiety   . Back pain   . Chest pain    a. 07/2015 Myoview: EF 71%, medium size, mild intensity, partially reversible septal defect with overlying breast attenuation -> likely artifact. No significant reversible ischemia.  . Constipation   . Depression   . Edema    feet and legs  . Frequent headaches   . GERD (gastroesophageal reflux disease)   . History of DVT (deep vein thrombosis) 2018  . Hypertension   . Hypothyroidism   . Insulin resistance   . Joint pain   . Migraines   . OSA (obstructive sleep apnea) 03/17/2016  . Osteoarthritis   . Panic anxiety syndrome   . SOB (shortness of breath)    a. 04/2016 Echo: EF 60-65%, Gr1  DD.  [name] Swallowing difficulty      Social History   Socioeconomic History  . Marital status: Single    Spouse name: Not on file  . Number of children: Not on file  . Years of education: Not on file  . Highest education level: Not on file  Occupational History  . Occupation: Assembler    Comment: [employer]  Tobacco Use  . Smoking status: Former Smoker    Types: Cigarettes  . Smokeless tobacco: Never Used  . Tobacco comment: quit 1992  Substance and Sexual Activity  . Alcohol use: No    Alcohol/week: 0.0 standard drinks  . Drug use: No  . Sexual activity: Yes    Partners: Male  Other Topics Concern  . Not on file  Social History Narrative   She has 2 children (grown). She did not retain custody of these children.   Lives with boyfriend in [place].   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 Difficulty of Paying Living Expenses:   Food Insecurity:   . Worried About Charity fundraiser in the Last Year:   . Arboriculturist in the Last Year:   Transportation Needs:   . Film/video editor (Medical):   [name] Lack of Transportation (Non-Medical):   Physical Activity:   . Days of Exercise per Week:   .  Minutes of Exercise per Session:   Stress:   . Feeling of Stress :   Social Connections:   . Frequency of Communication with Friends and Family:   . Frequency of Social Gatherings with Friends and Family:   . Attends Religious Services:   . Active Member of Clubs or Organizations:   . Attends Archivist Meetings:   [name] Marital Status:   Intimate Partner Violence:   . Fear of Current or Ex-Partner:   . Emotionally Abused:   [name] Physically Abused:   . Sexually Abused:     Past Surgical History:  Procedure Laterality Date  . CESAREAN SECTION  2001 & 2002  . ELBOW SURGERY    . MINOR CARPAL TUNNEL     pinched nerve in elbow  . WISDOM TOOTH EXTRACTION      Family History    Problem Relation Age of Onset  . Hypertension Mother        Living  . Arthritis Mother   . Thyroid disease Mother   . Heart disease Mother        MI at age 72  . Heart attack Mother   . Migraines Mother   . Depression Mother   . Obesity Mother   . Congestive Heart Failure Father        ?CHF  . Hypertension Maternal Grandmother   . Parkinson's disease Maternal Grandmother   . Alzheimer's disease Maternal Grandmother   . Cancer Maternal Grandfather   . Hypertension Maternal Grandfather   . Hypertension Paternal Grandmother   . Hypertension Paternal Grandfather   . Gout Brother   . ADD / ADHD Son        x1  . Other Son        #2-Unknown  . Diabetes Neg Hx     Allergies  Allergen Reactions  . Adhesive [Tape] Other (See Comments)    Skin Burn.  [name] [Menthol (Topical Analgesic)] Other (See Comments)    Skin Burn.  . Sulfa Antibiotics Rash    Current Outpatient Medications on File Prior to Visit  Medication Sig Dispense Refill  . ALPRAZolam (XANAX) 0.25 MG tablet Take 1 tablet (0.25 mg total) by mouth 2 (two) times daily as needed for anxiety or sleep. 30 tablet 0  . amLODipine (NORVASC) 5 MG tablet Take 1 tablet (5 mg total) by mouth daily. 90 tablet 0  . levothyroxine (SYNTHROID) 75 MCG tablet Take 1 tablet (75 mcg total) by mouth daily. 30 tablet 3  . lisinopril (ZESTRIL) 10 MG tablet Take 1 tablet (10 mg total) by mouth daily. 90 tablet 0  . montelukast (SINGULAIR) 10 MG tablet Take 1 tablet (10 mg total) by mouth at bedtime. 30 tablet 3  . sertraline (ZOLOFT) 100 MG tablet Take 1 tablet (100 mg total) by mouth daily. 30 tablet 3  . traZODone (DESYREL) 50 MG tablet 1/2-1 tablet by mouth at bedtime as needed for sleep 30 tablet 3   No current facility-administered medications on file prior to visit.    BP (!) 142/88 (BP Location: Right Arm, Patient Position: Sitting, Cuff Size: Large)   Pulse 79   Temp 98.6 F (37 C) (Oral)   Resp 16   Ht 5\' 2"  (1.575 m)    Wt (!) 284 lb (128.8 kg)   SpO2 99%   BMI 51.94 kg/m       Objective:   Physical Exam  Physical Exam  Constitutional: She is oriented to person, place, and time. She  appears well-developed and well-nourished. No distress.  HENT:  Head: Normocephalic and atraumatic.  Right Ear: Tympanic membrane and ear canal normal.  Left Ear: Tympanic membrane and ear canal normal.  Mouth/Throat: Not examined- pt wearing mask Eyes: Pupils are equal, round, and reactive to light. No scleral icterus.  Neck: Normal range of motion. No thyromegaly present.  Cardiovascular: Normal rate and regular rhythm.   No murmur heard. Pulmonary/Chest: Effort normal and breath sounds normal. No respiratory distress. He has no wheezes. She has no rales. She exhibits no tenderness.  Abdominal: Soft. Bowel sounds are normal. She exhibits no distension and no mass. There is no tenderness. There is no rebound and no guarding.  Musculoskeletal: She exhibits no edema.  Lymphadenopathy:    She has no cervical adenopathy.  Neurological: She is alert and oriented to person, place, and time. She has normal patellar reflexes. She exhibits normal muscle tone. Coordination normal.  Skin: Skin is warm and dry.  Psychiatric: She has a normal mood and affect. Her behavior is normal. Judgment and thought content normal.  Breasts: Examined lying Right: Without masses, retractions, discharge or axillary adenopathy.  Left: Without masses, retractions, discharge or axillary adenopathy.  Inguinal/mons: Normal without inguinal adenopathy  External genitalia: Normal  BUS/Urethra/Skene's glands: Normal  Bladder: Normal  Vagina: Normal  Cervix: Normal  Uterus: normal in size, shape and contour. Midline and mobile  Adnexa/parametria:  Rt: Without masses or tenderness.  Lt: Without masses or tenderness.  Anus and perineum: Normal            Assessment & Plan:  Preventative care- discussed healthy diet, exercise and weight  loss. She [facility]. Mammo up to date. Declines flu and covid vaccination.  Pap performed today with chaperone.  Hot flashes- refer to GYN.   This visit occurred during the SARS-CoV-2 public health emergency.  Safety protocols were in place, including screening questions prior to the visit, additional usage of staff PPE, and extensive cleaning of exam room while observing appropriate contact time as indicated for disinfecting solutions.          Assessment & Plan:

## 2019-09-03 LAB — HEPATITIS C ANTIBODY
Hepatitis C Ab: NONREACTIVE
SIGNAL TO CUT-OFF: 0.08 (ref <1.00)

## 2019-09-03 LAB — CYTOLOGY - PAP
Comment: NEGATIVE
Diagnosis: NEGATIVE
High risk HPV: NEGATIVE

## 2019-09-06 ENCOUNTER — Encounter (HOSPITAL_BASED_OUTPATIENT_CLINIC_OR_DEPARTMENT_OTHER): Payer: Self-pay | Admitting: *Deleted

## 2019-09-06 ENCOUNTER — Emergency Department (HOSPITAL_BASED_OUTPATIENT_CLINIC_OR_DEPARTMENT_OTHER)
Admission: EM | Admit: 2019-09-06 | Discharge: 2019-09-06 | Disposition: A | Discharge status: Home / Self Care | Payer: 59 | Attending: Emergency Medicine | Admitting: Emergency Medicine

## 2019-09-06 ENCOUNTER — Other Ambulatory Visit: Payer: Self-pay

## 2019-09-06 DIAGNOSIS — Z5321 Procedure and treatment not carried out due to patient leaving prior to being seen by health care provider: Secondary | ICD-10-CM | POA: Diagnosis not present

## 2019-09-06 DIAGNOSIS — R1030 Lower abdominal pain, unspecified: Secondary | ICD-10-CM | POA: Diagnosis present

## 2019-09-06 LAB — CBC WITH DIFFERENTIAL/PLATELET
Abs Immature Granulocytes: 0.05 10*3/uL (ref 0.00–0.07)
Basophils Absolute: 0 10*3/uL (ref 0.0–0.1)
Basophils Relative: 1 %
Eosinophils Absolute: 0.2 10*3/uL (ref 0.0–0.5)
Eosinophils Relative: 3 %
HCT: 37.3 % (ref 36.0–46.0)
Hemoglobin: 12.4 g/dL (ref 12.0–15.0)
Immature Granulocytes: 1 %
Lymphocytes Relative: 20 %
Lymphs Abs: 1.4 10*3/uL (ref 0.7–4.0)
MCH: 28.8 pg (ref 26.0–34.0)
MCHC: 33.2 g/dL (ref 30.0–36.0)
MCV: 86.7 fL (ref 80.0–100.0)
Monocytes Absolute: 0.5 10*3/uL (ref 0.1–1.0)
Monocytes Relative: 7 %
Neutro Abs: 4.8 10*3/uL (ref 1.7–7.7)
Neutrophils Relative %: 68 %
Platelets: 286 10*3/uL (ref 150–400)
RBC: 4.3 MIL/uL (ref 3.87–5.11)
RDW: 13.1 % (ref 11.5–15.5)
WBC: 6.9 10*3/uL (ref 4.0–10.5)
nRBC: 0 % (ref 0.0–0.2)

## 2019-09-06 LAB — COMPREHENSIVE METABOLIC PANEL
ALT: 22 U/L (ref 0–44)
AST: 20 U/L (ref 15–41)
Albumin: 3.8 g/dL (ref 3.5–5.0)
Alkaline Phosphatase: 55 U/L (ref 38–126)
Anion gap: 8 (ref 5–15)
BUN: 11 mg/dL (ref 6–20)
CO2: 26 mmol/L (ref 22–32)
Calcium: 8.5 mg/dL — ABNORMAL LOW (ref 8.9–10.3)
Chloride: 100 mmol/L (ref 98–111)
Creatinine, Ser: 0.79 mg/dL (ref 0.44–1.00)
GFR calc Af Amer: >60 mL/min (ref >60)
GFR calc non Af Amer: >60 mL/min (ref >60)
Glucose, Bld: 95 mg/dL (ref 70–99)
Potassium: 4.1 mmol/L (ref 3.5–5.1)
Sodium: 134 mmol/L — ABNORMAL LOW (ref 135–145)
Total Bilirubin: 0.4 mg/dL (ref 0.3–1.2)
Total Protein: 7.5 g/dL (ref 6.5–8.1)

## 2019-09-06 LAB — URINALYSIS, ROUTINE W REFLEX MICROSCOPIC
Bilirubin Urine: NEGATIVE
Glucose, UA: NEGATIVE mg/dL
Hgb urine dipstick: NEGATIVE
Ketones, ur: NEGATIVE mg/dL
Leukocytes,Ua: NEGATIVE
Nitrite: NEGATIVE
Protein, ur: NEGATIVE mg/dL
Specific Gravity, Urine: >1.03 — ABNORMAL HIGH (ref 1.005–1.030)
pH: 6 (ref 5.0–8.0)

## 2019-09-06 LAB — LIPASE, BLOOD: Lipase: 26 U/L (ref 11–51)

## 2019-09-06 LAB — PREGNANCY, URINE: Preg Test, Ur: NEGATIVE

## 2019-09-06 NOTE — ED Triage Notes (Signed)
Pt c/o left lower abd pain x 4 days

## 2019-09-06 NOTE — ED Notes (Signed)
Called for vitals x 2 with no answer.

## 2019-09-06 NOTE — ED Notes (Signed)
Called to recheck vital signs with no answer from lobby.

## 2019-09-10 ENCOUNTER — Encounter (INDEPENDENT_AMBULATORY_CARE_PROVIDER_SITE_OTHER): Payer: Self-pay

## 2019-09-11 NOTE — Telephone Encounter (Signed)
For your information  

## 2019-09-18 ENCOUNTER — Encounter: Payer: Self-pay | Admitting: Family

## 2019-09-23 ENCOUNTER — Encounter: Payer: Self-pay | Admitting: Family

## 2019-09-23 ENCOUNTER — Other Ambulatory Visit: Payer: Self-pay

## 2019-09-23 ENCOUNTER — Ambulatory Visit (INDEPENDENT_AMBULATORY_CARE_PROVIDER_SITE_OTHER): Payer: 59 | Admitting: Family

## 2019-09-23 VITALS — BP 143/89 | HR 79 | Temp 98.6°F | Resp 16 | Ht 62.0 in | Wt 285.0 lb

## 2019-09-23 DIAGNOSIS — M5416 Radiculopathy, lumbar region: Secondary | ICD-10-CM

## 2019-09-23 MED ORDER — METHYLPREDNISOLONE 4 MG PO TBPK
ORAL_TABLET | ORAL | 0 refills | Status: DC
Start: 2019-09-23 — End: 2019-12-30

## 2019-09-23 NOTE — Progress Notes (Signed)
Subjective:    Patient ID: Sharon Rivers, female    DOB: 13-Feb-1970, 49 y.o.   MRN: [id number]  HPI  Patient is a 49 yr old female who presents today with chief complaint of low back pain. Pain radiates down the left hip into the left foot. Reports that last Monday AM she was unable to get out of her chair and "ended up on the floor."  Pain radiates into the 2nd and 3rd toes.  Denies loss of bowel or bladder.  Reports intermittent tingling in the left leg and foot.  She has tried tylenol.    Of note, the patient had a MRI of the lumbar spine performed 08/27/16.  Results were as follows:  Small left foraminal disc protrusion L1-2  Small left paracentral disc protrusion L5-S1 without neural compression  She was referred to neurosurgery back in 2018 and they recommended ESI.  (Dr. Saintclair Halsted).  Review of Systems See HPI  Past Medical History:  Diagnosis Date   Anxiety    Back pain    Chest pain    a. 07/2015 Myoview: EF 71%, medium size, mild intensity, partially reversible septal defect with overlying breast attenuation -> likely artifact. No significant reversible ischemia.   Constipation    Depression    Edema    feet and legs   Frequent headaches    GERD (gastroesophageal reflux disease)    History of DVT (deep vein thrombosis) 2018   Hypertension    Hypothyroidism    Insulin resistance    Joint pain    Migraines    OSA (obstructive sleep apnea) 03/17/2016   Osteoarthritis    Panic anxiety syndrome    SOB (shortness of breath)    a. 04/2016 Echo: EF 60-65%, Gr1 DD.   Swallowing difficulty      Social History   Socioeconomic History   Marital status: Single    Spouse name: Not on file   Number of children: Not on file   Years of education: Not on file   Highest education level: Not on file  Occupational History   Occupation: Assembler    Comment: [employer]  Tobacco Use   Smoking status: Former Smoker    Types: Cigarettes   Smokeless  tobacco: Never Used   Tobacco comment: quit 1992  Substance and Sexual Activity   Alcohol use: No    Alcohol/week: 0.0 standard drinks   Drug use: No   Sexual activity: Yes    Partners: Male    Birth control/protection: None  Other Topics Concern   Not on file  Social History Narrative   She has 2 children (grown). She did not retain custody of these children.   Lives with boyfriend in [place].   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   Social Determinants of Radio broadcast assistant Strain:    Difficulty of Paying Living Expenses:   Food Insecurity:    Worried About Charity fundraiser in the Last Year:    Arboriculturist in the Last Year:   Transportation Needs:    Film/video editor (Medical):    Lack of Transportation (Non-Medical):   Physical Activity:    Days of Exercise per Week:    Minutes of Exercise per Session:   Stress:    Feeling of Stress :   Social Connections:    Frequency of Communication with Friends and Family:    Frequency of Social Gatherings with Friends and Family:  Attends Religious Services:    Active Member of Clubs or Organizations:    Attends Music therapist:    Marital Status:   Intimate Partner Violence:    Fear of Current or Ex-Partner:    Emotionally Abused:    Physically Abused:    Sexually Abused:     Past Surgical History:  Procedure Laterality Date   CESAREAN SECTION  2001 & 2002   ELBOW SURGERY     MINOR CARPAL TUNNEL     pinched nerve in elbow   WISDOM TOOTH EXTRACTION      Family History  Problem Relation Age of Onset   Hypertension Mother        Living   Arthritis Mother    Thyroid disease Mother    Heart disease Mother        MI at age 45   Heart attack Mother    Migraines Mother    Depression Mother    Obesity Mother    Congestive Heart Failure Father        ?CHF   Hypertension Maternal Grandmother    Parkinson's disease Maternal  Grandmother    Alzheimer's disease Maternal Grandmother    Cancer Maternal Grandfather    Hypertension Maternal Grandfather    Hypertension Paternal Grandmother    Hypertension Paternal Grandfather    Gout Brother    ADD / ADHD Son        x1   Other Son        #2-Unknown   Diabetes Neg Hx     Allergies  Allergen Reactions   Adhesive [Tape] Other (See Comments)    Skin Burn.   Biofreeze [Menthol (Topical Analgesic)] Other (See Comments)    Skin Burn.   Sulfa Antibiotics Rash    Current Outpatient Medications on File Prior to Visit  Medication Sig Dispense Refill   ALPRAZolam (XANAX) 0.25 MG tablet Take 1 tablet (0.25 mg total) by mouth 2 (two) times daily as needed for anxiety or sleep. 30 tablet 0   amLODipine (NORVASC) 5 MG tablet Take 1 tablet (5 mg total) by mouth daily. 90 tablet 0   levothyroxine (SYNTHROID) 75 MCG tablet Take 1 tablet (75 mcg total) by mouth daily. 30 tablet 3   lisinopril (ZESTRIL) 10 MG tablet Take 1 tablet (10 mg total) by mouth daily. 90 tablet 0   montelukast (SINGULAIR) 10 MG tablet Take 1 tablet (10 mg total) by mouth at bedtime. 30 tablet 3   sertraline (ZOLOFT) 100 MG tablet Take 1 tablet (100 mg total) by mouth daily. 30 tablet 3   traZODone (DESYREL) 50 MG tablet 1/2-1 tablet by mouth at bedtime as needed for sleep 30 tablet 3   No current facility-administered medications on file prior to visit.    BP (!) 143/89 (BP Location: Right Arm, Patient Position: Sitting, Cuff Size: Large)    Pulse 79    Temp 98.6 F (37 C) (Oral)    Resp 16    Ht 5\' 2"  (1.575 m)    Wt 285 lb (129.3 kg)    LMP 09/02/2019    SpO2 99%    BMI 52.13 kg/m       Objective:   Physical Exam Constitutional:      Appearance: She is well-developed.  Cardiovascular:     Rate and Rhythm: Normal rate and regular rhythm.     Heart sounds: Normal heart sounds. No murmur heard.   Pulmonary:     Effort: Pulmonary effort is normal.  No respiratory distress.       Breath sounds: Normal breath sounds. No wheezing.  Musculoskeletal:     Thoracic back: Tenderness present.     Lumbar back: Tenderness present.  Neurological:     Deep Tendon Reflexes:     Reflex Scores:      Patellar reflexes are 2+ on the right side and 3+ on the left side.    Comments: RLE strength 5/5 LLE strength 4-5/5- assessment limited by pt's pain  Psychiatric:        Behavior: Behavior normal.        Thought Content: Thought content normal.        Judgment: Judgment normal.           Assessment & Plan:  Lumbar radiculopathy- will treat with medrol dose pak. Refer back to neurosurgery for ongoing management. Pt is advised as follows:   Call if new/worsening symptoms, new numbness, or bowel/bladder incontinence.   This visit occurred during the SARS-CoV-2 public health emergency.  Safety protocols were in place, including screening questions prior to the visit, additional usage of staff PPE, and extensive cleaning of exam room while observing appropriate contact time as indicated for disinfecting solutions.

## 2019-09-23 NOTE — Patient Instructions (Signed)
Please begin medrol dose pak.  You should be contacted about scheduling your appointment with the Neurosurgeon. Call if new/worsening symptoms, new numbness, or bowel/bladder incontinence.

## 2019-09-26 ENCOUNTER — Other Ambulatory Visit: Payer: Self-pay | Admitting: Family

## 2019-10-01 DIAGNOSIS — Z6841 Body Mass Index (BMI) 40.0 and over, adult: Secondary | ICD-10-CM | POA: Insufficient documentation

## 2019-10-01 DIAGNOSIS — M5416 Radiculopathy, lumbar region: Secondary | ICD-10-CM | POA: Insufficient documentation

## 2019-10-14 ENCOUNTER — Encounter: Payer: Self-pay | Admitting: Family

## 2019-10-15 ENCOUNTER — Other Ambulatory Visit: Payer: Self-pay | Admitting: Student

## 2019-10-15 DIAGNOSIS — M5416 Radiculopathy, lumbar region: Secondary | ICD-10-CM

## 2019-10-21 ENCOUNTER — Telehealth (INDEPENDENT_AMBULATORY_CARE_PROVIDER_SITE_OTHER): Payer: 59 | Admitting: Family Medicine

## 2019-10-21 ENCOUNTER — Encounter: Payer: Self-pay | Admitting: Family Medicine

## 2019-10-21 VITALS — BP 181/90 | HR 77 | Ht 62.0 in

## 2019-10-21 DIAGNOSIS — J0140 Acute pansinusitis, unspecified: Secondary | ICD-10-CM | POA: Diagnosis not present

## 2019-10-21 MED ORDER — PROMETHAZINE-DM 6.25-15 MG/5ML PO SYRP: 5.0000 mL | ORAL_SOLUTION | Freq: Four times a day (QID) | ORAL | 0 refills | Status: DC | PRN

## 2019-10-21 MED ORDER — FLUTICASONE PROPIONATE 50 MCG/ACT NA SUSP: 2.0000 | Freq: Every day | NASAL | 6 refills | Status: DC

## 2019-10-21 MED ORDER — AMOXICILLIN-POT CLAVULANATE 875-125 MG PO TABS: 1.0000 | ORAL_TABLET | Freq: Two times a day (BID) | ORAL | 0 refills | Status: DC

## 2019-10-21 NOTE — Progress Notes (Signed)
Virtual Visit via Video Note  I connected with Sharon Rivers on 10/21/19 at 11:00 AM EDT by a video enabled telemedicine application and verified that I am speaking with the correct person using two identifiers.  Location/ persons in visit  Patient: home alone Provider: office   I discussed the limitations of evaluation and management by telemedicine and the availability of in person appointments. The patient expressed understanding and agreed to proceed.  History of Present Illness: Pt is home c/o sinus congestion , n/v x  1 week   She is unable to keep anything down.   She is taking otc with no relief and singulair  No fevers  Cough is constant and productive  + body aches Observations/Objective: Vitals:   10/21/19 1056  BP: (!) 181/90  Pulse: 77   Pt is in nad   Assessment and Plan: 1. Acute non-recurrent pansinusitis Phenergan dm for cough and nausea/ vomiting If vomiting con't--- go to ER Pt will get covid test  flonase / abx per orders  Pt has not been able to take bp meds due to N/V--  bp elevated today Pt understands if she con't to be unable to hold anything down she will need to go to ER  - amoxicillin-clavulanate (AUGMENTIN) 875-125 MG tablet; Take 1 tablet by mouth 2 (two) times daily.  Dispense: 20 tablet; Refill: 0 - promethazine-dextromethorphan (PROMETHAZINE-DM) 6.25-15 MG/5ML syrup; Take 5 mLs by mouth 4 (four) times daily as needed.  Dispense: 118 mL; Refill: 0 - fluticasone (FLONASE) 50 MCG/ACT nasal spray; Place 2 sprays into both nostrils daily.  Dispense: 16 g; Refill: 6  Follow Up Instructions:    I discussed the assessment and treatment plan with the patient. The patient was provided an opportunity to ask questions and all were answered. The patient agreed with the plan and demonstrated an understanding of the instructions.   The patient was advised to call back or seek an in-person evaluation if the symptoms worsen or if the condition fails to improve  as anticipated.  I provided 25 minutes of non-face-to-face time during this encounter.   Ann Held, DO

## 2019-10-22 ENCOUNTER — Encounter: Payer: Self-pay | Admitting: Family

## 2019-10-23 NOTE — Telephone Encounter (Signed)
Spoke to patient. She states that she is keeping down water today. I advised her if recurrent vomiting/inability to keep down fluids she should go to the ED.  Pt verbalizes understanding.

## 2019-10-28 ENCOUNTER — Encounter: Payer: Self-pay | Admitting: Family

## 2019-10-28 ENCOUNTER — Encounter: Payer: 59 | Admitting: Obstetrics & Gynecology

## 2019-11-04 ENCOUNTER — Other Ambulatory Visit: Payer: Self-pay

## 2019-11-04 ENCOUNTER — Ambulatory Visit
Admission: RE | Admit: 2019-11-04 | Discharge: 2019-11-04 | Disposition: A | Discharge status: Home / Self Care | Payer: 59 | Source: Ambulatory Visit | Attending: Student | Admitting: Student

## 2019-11-04 DIAGNOSIS — M5416 Radiculopathy, lumbar region: Secondary | ICD-10-CM

## 2019-11-05 DIAGNOSIS — M5127 Other intervertebral disc displacement, lumbosacral region: Secondary | ICD-10-CM | POA: Insufficient documentation

## 2019-11-13 ENCOUNTER — Encounter: Payer: Self-pay | Admitting: Family

## 2019-11-14 MED ORDER — TRAZODONE HCL 100 MG PO TABS
100.0000 mg | ORAL_TABLET | Freq: Every day | ORAL | 1 refills | Status: DC
Start: 2019-11-14 — End: 2019-11-14

## 2019-11-14 MED ORDER — TRAZODONE HCL 100 MG PO TABS: 100.0000 mg | ORAL_TABLET | Freq: Every day | ORAL | 1 refills | Status: DC

## 2019-11-14 NOTE — Addendum Note (Signed)
Addended byDamita Dunnings D on: 11/14/2019 04:12 PM   Modules accepted: Orders

## 2019-11-15 ENCOUNTER — Other Ambulatory Visit: Payer: Self-pay | Admitting: Family

## 2019-12-02 ENCOUNTER — Ambulatory Visit: Payer: 59 | Admitting: Family

## 2019-12-04 ENCOUNTER — Other Ambulatory Visit: Payer: Self-pay | Admitting: Student

## 2019-12-04 DIAGNOSIS — M5127 Other intervertebral disc displacement, lumbosacral region: Secondary | ICD-10-CM

## 2019-12-12 ENCOUNTER — Other Ambulatory Visit: Payer: Self-pay | Admitting: Family

## 2019-12-16 ENCOUNTER — Encounter: Payer: Self-pay | Admitting: Obstetrics & Gynecology

## 2019-12-16 ENCOUNTER — Ambulatory Visit (INDEPENDENT_AMBULATORY_CARE_PROVIDER_SITE_OTHER): Payer: 59 | Admitting: Obstetrics & Gynecology

## 2019-12-16 ENCOUNTER — Other Ambulatory Visit: Payer: Self-pay

## 2019-12-16 VITALS — BP 127/75 | HR 72 | Ht 62.0 in | Wt 281.0 lb

## 2019-12-16 DIAGNOSIS — N939 Abnormal uterine and vaginal bleeding, unspecified: Secondary | ICD-10-CM | POA: Diagnosis not present

## 2019-12-16 DIAGNOSIS — N393 Stress incontinence (female) (male): Secondary | ICD-10-CM

## 2019-12-16 DIAGNOSIS — N951 Menopausal and female climacteric states: Secondary | ICD-10-CM | POA: Diagnosis not present

## 2019-12-16 MED ORDER — MEGESTROL ACETATE 40 MG PO TABS: 40.0000 mg | ORAL_TABLET | Freq: Two times a day (BID) | ORAL | 5 refills | Status: DC

## 2019-12-16 NOTE — Progress Notes (Signed)
Subjective:     Sharon Rivers is a 49 y.o. female here for eval of hot flushes. She recently had an annual GYN exam with PAP by her primary care provider. Pt reports that her hot flushes are becoming unbearable and she is here for treatment options.  She reports that her cycles have become less frequent but, are heavy when they occur. She has a h/o irreg cycles.     Pt also reports incontinence since she delived her first child >25 year prev.  Pt reports a h/o a blood clot in her right anlke years ago after prolonged standing. Pt reports that her incontinence is getting worse and she is interested in management.    Gynecologic History Patient's last menstrual period was 11/22/2019. Last Pap: 09/02/2019. Results were: normal  Obstetric History OB History  Gravida Para Term Preterm AB Living  3 2 2   1 2   SAB TAB Ectopic Multiple Live Births          2    # Outcome Date GA Lbr Len/2nd Weight Sex Delivery Anes PTL Lv  3 Term 2002 [redacted]w[redacted]d   M CS-LTranv Spinal N LIV  2 Term 2001 [redacted]w[redacted]d   M CS-LTranv EPI N LIV  1 AB             The following portions of the patient's history were reviewed and updated as appropriate: allergies, current medications, past family history, past medical history, past social history, past surgical history and problem list.  Review of Systems Pertinent items are noted in HPI.    Objective:  BP 127/75   Pulse 72   Ht 5\' 2"  (1.575 m)   Wt 281 lb 0.6 oz (127.5 kg)   LMP 11/22/2019   BMI 51.40 kg/m   CONSTITUTIONAL: Well-developed, well-nourished female in no acute distress.  HENT:  Normocephalic, atraumatic EYES: Conjunctivae and EOM are normal. No scleral icterus.  NECK: Normal range of motion SKIN: Skin is warm and dry. No rash noted. Not diaphoretic.No pallor. [place]: Alert and oriented to person, place, and time. Normal coordination.  Abd: soft, NT, ND   GU: EGBUS: no lesions Vagina: no blood in vault Cervix: no lesion; no mucopurulent d/c Uterus:  small, mobile; no prolapse noted. The q-tip angle is >90 degrees with valsalva Adnexa: no masses; non tender    Sharon Rivers was seen today for hot flashes.  Diagnoses and all orders for this visit:  Stress incontinence in female -     Ambulatory referral to Urogynecology  Menopausal hot flushes  Abnormal uterine bleeding (AUB) -     megestrol (MEGACE) 40 MG tablet; Take 1 tablet (40 mg total) by mouth 2 (two) times daily. Can increase to two tablets twice a day in the event of heavy bleeding -     US PELVIS TRANSVAGINAL NON-OB (TV ONLY); Future  Hot flushes- given pts h/o blood clots, EES is contraindicated. Will see if her sx are improved with Megace. Pt is already on an SSRI. Will not adjust at present.   Rec f/u in 6 weeks or sooner prn  Sharon Rivers Sharon Rivers, M.D., Cherlynn June

## 2019-12-16 NOTE — Patient Instructions (Signed)

## 2019-12-23 ENCOUNTER — Other Ambulatory Visit: Payer: Self-pay

## 2019-12-23 ENCOUNTER — Ambulatory Visit (HOSPITAL_BASED_OUTPATIENT_CLINIC_OR_DEPARTMENT_OTHER)
Admission: RE | Admit: 2019-12-23 | Discharge: 2019-12-23 | Disposition: A | Discharge status: Home / Self Care | Payer: 59 | Source: Ambulatory Visit | Attending: Obstetrics & Gynecology | Admitting: Obstetrics & Gynecology

## 2019-12-23 DIAGNOSIS — N939 Abnormal uterine and vaginal bleeding, unspecified: Secondary | ICD-10-CM | POA: Diagnosis present

## 2019-12-24 ENCOUNTER — Encounter: Payer: Self-pay | Admitting: Obstetrics & Gynecology

## 2019-12-30 ENCOUNTER — Ambulatory Visit
Admission: RE | Admit: 2019-12-30 | Discharge: 2019-12-30 | Disposition: A | Discharge status: Home / Self Care | Payer: 59 | Source: Ambulatory Visit | Attending: Student | Admitting: Student

## 2019-12-30 ENCOUNTER — Other Ambulatory Visit: Payer: Self-pay

## 2019-12-30 DIAGNOSIS — M5127 Other intervertebral disc displacement, lumbosacral region: Secondary | ICD-10-CM

## 2019-12-30 MED ORDER — IOPAMIDOL (ISOVUE-M 200) INJECTION 41%
1.0000 mL | Freq: Once | INTRAMUSCULAR | Status: AC
  Administered 2019-12-30: 1 mL via EPIDURAL

## 2019-12-30 MED ORDER — METHYLPREDNISOLONE ACETATE 40 MG/ML INJ SUSP (RADIOLOG
120.0000 mg | Freq: Once | INTRAMUSCULAR | Status: AC
  Administered 2019-12-30: 120 mg via EPIDURAL

## 2019-12-30 NOTE — Discharge Instructions (Signed)
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
## 2020-01-06 ENCOUNTER — Other Ambulatory Visit: Payer: Self-pay

## 2020-01-06 ENCOUNTER — Ambulatory Visit (INDEPENDENT_AMBULATORY_CARE_PROVIDER_SITE_OTHER): Payer: 59 | Admitting: Obstetrics and Gynecology

## 2020-01-06 ENCOUNTER — Encounter: Payer: Self-pay | Admitting: Obstetrics and Gynecology

## 2020-01-06 VITALS — BP 140/80

## 2020-01-06 DIAGNOSIS — N3281 Overactive bladder: Secondary | ICD-10-CM

## 2020-01-06 DIAGNOSIS — N393 Stress incontinence (female) (male): Secondary | ICD-10-CM | POA: Diagnosis not present

## 2020-01-06 DIAGNOSIS — R350 Frequency of micturition: Secondary | ICD-10-CM

## 2020-01-06 LAB — POCT URINALYSIS DIPSTICK
Bilirubin, UA: NEGATIVE
Blood, UA: NEGATIVE
Glucose, UA: NEGATIVE
Ketones, UA: NEGATIVE
Leukocytes, UA: NEGATIVE
Nitrite, UA: NEGATIVE
Odor: NEGATIVE
Protein, UA: NEGATIVE
Spec Grav, UA: 1.015 (ref 1.010–1.025)
Urobilinogen, UA: 0.2 E.U./dL
pH, UA: 5 (ref 5.0–8.0)

## 2020-01-06 MED ORDER — SOLIFENACIN SUCCINATE 5 MG PO TABS: 5.0000 mg | ORAL_TABLET | Freq: Every day | ORAL | 5 refills | Status: DC

## 2020-01-06 NOTE — Patient Instructions (Signed)
Today we talked about ways to manage bladder urgency such as altering your diet to avoid irritative beverages and foods (bladder diet) as well as attempting to decrease stress and other exacerbating factors.   The Most Bothersome Foods* The Least Bothersome Foods*  Coffee - Regular & Decaf Tea - caffeinated Carbonated beverages - cola, non-colas, diet & caffeine-free Alcohols - Beer, Red Wine, White Wine, Champagne Fruits - Grapefruit, [place], Orange, [employer] - Cranberry, Grapefruit, Orange, Pineapple Vegetables - Tomato & Tomato Products Flavor Enhancers - Hot peppers, Spicy foods, Chili, Horseradish, Vinegar, Monosodium glutamate (MSG) Artificial Sweeteners - NutraSweet, Sweet 'N Low, Equal (sweetener), Saccharin Ethnic foods - [place], [place], [place] food [employer] Scripts - low-fat & whole Fruits - Bananas, Blueberries, Honeydew melon, Pears, Raisins, Watermelon Vegetables - Broccoli, Brussels Sprouts, [place], Carrots, Cauliflower, [place], Cucumber, Mushrooms, Peas, Radishes, Squash, Zucchini, White potatoes, Sweet potatoes & yams Poultry - Chicken, Eggs, [place], [employer] - Beef, Programmer, multimedia, Lamb Seafood - Shrimp, [place] fish, Salmon Grains - Oat, Rice Snacks - Pretzels, Popcorn  *Lissa Morales et al. Diet and its role in interstitial cystitis/bladder pain syndrome (IC/BPS) and comorbid conditions. [place] 2012 Jan 11.    We discussed the symptoms of overactive bladder (OAB), which include urinary urgency, urinary frequency, night-time urination, with or without urge incontinence.  We discussed management including behavioral therapy (decreasing bladder irritants by following a bladder diet, urge suppression strategies, timed voids, bladder retraining), physical therapy, medication; and for refractory cases posterior tibial nerve stimulation, sacral neuromodulation, and intravesical botulinum toxin injection.   For anticholinergic medications, we discussed the potential  side effects of anticholinergics including dry eyes, dry mouth, constipation, rare risks of cognitive impairment and urinary retention. You were given Vesicare 5mg  daily.    It can take a month to start working so give it time, but if you have bothersome side effects call sooner and we can try a different medication.  Call us if you have trouble filling the prescription or if it's not covered by your insurance.  For treatment of stress urinary incontinence, which is leakage with physical activity/movement/strainging/coughing, we discussed expectant management versus nonsurgical options versus surgery. Nonsurgical options include weight loss, physical therapy, as well as a pessary and surgical options.

## 2020-01-09 ENCOUNTER — Other Ambulatory Visit: Payer: Self-pay | Admitting: Family

## 2020-01-09 DIAGNOSIS — E039 Hypothyroidism, unspecified: Secondary | ICD-10-CM

## 2020-01-10 NOTE — Telephone Encounter (Signed)
Requesting:Xanax Contract:07/22/19 UDS:07/22/19 Last Visit:09/23/19 Next Visit:02/10/20 Last Refill:12/12/19  Please Advise

## 2020-01-11 ENCOUNTER — Encounter: Payer: Self-pay | Admitting: Family

## 2020-01-13 MED ORDER — PANTOPRAZOLE SODIUM 40 MG PO TBEC
40.0000 mg | DELAYED_RELEASE_TABLET | Freq: Every day | ORAL | 3 refills | Status: DC
Start: 2020-01-13 — End: 2020-03-13

## 2020-01-14 ENCOUNTER — Encounter: Payer: Self-pay | Admitting: Obstetrics and Gynecology

## 2020-01-14 DIAGNOSIS — N3281 Overactive bladder: Secondary | ICD-10-CM

## 2020-01-15 MED ORDER — TOLTERODINE TARTRATE ER 4 MG PO CP24: 4.0000 mg | ORAL_CAPSULE | Freq: Every day | ORAL | 5 refills | Status: DC

## 2020-01-15 NOTE — Addendum Note (Signed)
Addended by: Jaquita Folds on: 01/15/2020 10:55 AM   Modules accepted: Orders

## 2020-01-16 ENCOUNTER — Other Ambulatory Visit: Payer: Self-pay | Admitting: Obstetrics and Gynecology

## 2020-01-16 DIAGNOSIS — N3281 Overactive bladder: Secondary | ICD-10-CM

## 2020-01-24 ENCOUNTER — Encounter: Payer: Self-pay | Admitting: Family

## 2020-02-03 NOTE — Progress Notes (Signed)
[facility] Return Visit  SUBJECTIVE  History of Present Illness: Sharon Rivers is a 49 y.o. female seen in follow-up for overactive bladder and stress incontinence. Plan at last visit was start vesicare- this was denied coverage by her insurance, as well as Detrol. She was therefore started on Oxybutynin 5mg  and has been taking it about 4 weeks.  Feels like she is going to the bathroom a normal amount and no longer has that urgency and sudden need to rush to the bathroom. Still has some leakage with coughing and sneezing. She has dry mouth mostly in the morning but not as bothersome to her. Has also noticed headaches but has also noticed this prior to starting.   Still wearing pads due to stress incontinence. Has some vulvar irritation from the pads.  She was also referred to pelvic floor PT and has an appointment scheduled for February.    Past Medical History: Patient  has a past medical history of Anxiety, Back pain, Chest pain, Constipation, Depression, Edema, Frequent headaches, GERD (gastroesophageal reflux disease), History of DVT (deep vein thrombosis) (2018), Hypertension, Hypothyroidism, Insulin resistance, Joint pain, Migraines, OSA (obstructive sleep apnea) (03/17/2016), Osteoarthritis, Panic anxiety syndrome, SOB (shortness of breath), and Swallowing difficulty.   Past Surgical History: She  has a past surgical history that includes Cesarean section (2001 & 2002); Wisdom tooth extraction; Minor carpal tunnel; and Elbow surgery.   Medications: She has a current medication list which includes the following prescription(s): alprazolam, amlodipine, euthyrox, fluticasone, lisinopril, megestrol, montelukast, oxybutynin, pantoprazole, sertraline, and trazodone.   Allergies: Patient is allergic to adhesive [tape], biofreeze [menthol (topical analgesic)], and sulfa antibiotics.   Social History: Patient  reports that she has quit smoking. Her smoking use included  cigarettes. She has never used smokeless tobacco. She reports that she does not drink alcohol and does not use drugs.      OBJECTIVE     Physical Exam: Vitals:   02/10/20 0828  BP: (!) 165/85  Pulse: 72  Weight: 280 lb (127 kg)  Height: 5\' 2"  (1.575 m)   Gen: No apparent distress, A&O x 3.  Detailed Urogynecologic Evaluation:  Deferred. Prior exam showed:  Pelvic Exam: Normal external female genitalia; Bartholin's and Skene's glands normal in appearance; urethral meatus normal in appearance, no urethral masses or discharge.   CST: negative  Speculum exam reveals normal vaginal mucosa without atrophy. Cervix normal appearance. Uterus normal single, nontender. Adnexa no mass, fullness, tenderness.     POP-Q 01/06/20:   POP-Q  -2                                            Aa   -2                                           Ba  -6                                              C   3  Gh  4.5                                            Pb  9                                            tvl   -3                                            Ap  -3                                            Bp  -9                                              D       ASSESSMENT AND PLAN    Sharon Rivers is a 49 y.o. with:  1. Overactive bladder   2. SUI (stress urinary incontinence, female)   3. Vulvar irritation    1. OAB - She is doing well with the 5mg  oxybutynin so will continue on this dosage.  - If dry mouth becomes bothersome, she can try Biotene mouthwash or we can try switching to another medication.   2. SUI - Has pelvic PT scheduled in Feb  3. Vulvar irritation - recommended vulvar moisturizer- coconut oil, vitamin E or others.  - Can also try switching to another brand of pads  Follow up 4 months  10-18-1978, MD   Time spent: I spent 20 minutes dedicated to the care of this patient on the date of  this encounter to include pre-visit review of records, face-to-face time with the patient and post visit documentation.

## 2020-02-08 DIAGNOSIS — I2699 Other pulmonary embolism without acute cor pulmonale: Secondary | ICD-10-CM | POA: Insufficient documentation

## 2020-02-08 DIAGNOSIS — Z86711 Personal history of pulmonary embolism: Secondary | ICD-10-CM | POA: Insufficient documentation

## 2020-02-08 HISTORY — DX: Other pulmonary embolism without acute cor pulmonale: I26.99

## 2020-02-10 ENCOUNTER — Encounter: Payer: Self-pay | Admitting: Obstetrics and Gynecology

## 2020-02-10 ENCOUNTER — Ambulatory Visit (INDEPENDENT_AMBULATORY_CARE_PROVIDER_SITE_OTHER): Payer: 59 | Admitting: Obstetrics and Gynecology

## 2020-02-10 ENCOUNTER — Other Ambulatory Visit: Payer: Self-pay | Admitting: Family

## 2020-02-10 ENCOUNTER — Other Ambulatory Visit: Payer: Self-pay

## 2020-02-10 VITALS — BP 165/85 | HR 72 | Ht 62.0 in | Wt 280.0 lb

## 2020-02-10 DIAGNOSIS — N3281 Overactive bladder: Secondary | ICD-10-CM | POA: Diagnosis not present

## 2020-02-10 DIAGNOSIS — N393 Stress incontinence (female) (male): Secondary | ICD-10-CM

## 2020-02-10 DIAGNOSIS — N9089 Other specified noninflammatory disorders of vulva and perineum: Secondary | ICD-10-CM

## 2020-02-10 DIAGNOSIS — E039 Hypothyroidism, unspecified: Secondary | ICD-10-CM

## 2020-02-10 NOTE — Patient Instructions (Signed)
Try Biotene mouthwash for try mouth if needed.   Vulvovaginal moisturizer Options: [name] Vitamin E oil (pump or capsule) or cream (Gene's Vit E Cream) . Coconut oil . Silicone-based lubricant for use during intercourse ("wet platinum" is a brand available at most drugstores) . Crisco . V-magic Consider the ingredients of the product - the fewer the ingredients the better!  Directions for Use: Clean and dry your hands Gently dab the vulvar/vaginal area dry as needed Apply a "pea-sized" amount of the moisturizer onto your fingertip Using you other hand, open the labia  Apply the moisturizer to the vulvar/vaginal tissues Wear loose fitting underwear/clothing if possible following application Use moisturize up to 3 times daily as desired.

## 2020-02-10 NOTE — Telephone Encounter (Signed)
Requesting: alprazolam 0.25mg  Contract: 07/22/2019  UDS: 07/22/2019 Last Visit:  09/23/2019 Next Visit: None Last Refill: 01/10/2020 #30 and 0RF  Please Advise

## 2020-02-13 DIAGNOSIS — M5126 Other intervertebral disc displacement, lumbar region: Secondary | ICD-10-CM | POA: Insufficient documentation

## 2020-02-17 ENCOUNTER — Ambulatory Visit (INDEPENDENT_AMBULATORY_CARE_PROVIDER_SITE_OTHER): Payer: 59 | Admitting: Family

## 2020-02-17 ENCOUNTER — Other Ambulatory Visit: Payer: Self-pay

## 2020-02-17 VITALS — BP 131/77 | HR 78 | Temp 98.6°F | Resp 16 | Ht 66.0 in | Wt 283.0 lb

## 2020-02-17 DIAGNOSIS — R519 Headache, unspecified: Secondary | ICD-10-CM | POA: Diagnosis not present

## 2020-02-17 MED ORDER — CARBAMAZEPINE ER 100 MG PO TB12: 100.0000 mg | ORAL_TABLET | Freq: Two times a day (BID) | ORAL | 1 refills | Status: DC

## 2020-02-17 NOTE — Patient Instructions (Signed)
Please begin tegretol twice daily. You should be contacted about your referral to neurology.

## 2020-02-17 NOTE — Progress Notes (Signed)
Subjective:    Patient ID: Sharon Rivers, female    DOB: [birth year], 50 y.o.   MRN: [id number]  HPI   Patient is a 50 yr old female who presents today to discuss headaches.  She reports an intense headache on the left side of her head daily x 1 month. Pain starts behind the top of her left ear and radiates into the forehead and cheek on the left side. Pain can be severe at times but is intermittent.  She reports that she does have some blurring of the left eye when it occurs.  States she works for an [employer] and had a formal exam which was reportedly normal.  Jaw clenching- States that she has been doing the exercises we gave her and her clenching is improvin.    Review of Systems See HPI  Past Medical History:  Diagnosis Date  . Anxiety   . Back pain   . Chest pain    a. 07/2015 Myoview: EF 71%, medium size, mild intensity, partially reversible septal defect with overlying breast attenuation -> likely artifact. No significant reversible ischemia.  . Constipation   . Depression   . Edema    feet and legs  . Frequent headaches   . GERD (gastroesophageal reflux disease)   . History of DVT (deep vein thrombosis) 2018  . Hypertension   . Hypothyroidism   . Insulin resistance   . Joint pain   . Migraines   . OSA (obstructive sleep apnea) 03/17/2016  . Osteoarthritis   . Panic anxiety syndrome   . SOB (shortness of breath)    a. 04/2016 Echo: EF 60-65%, Gr1 DD.  [name] Swallowing difficulty      Social History   Socioeconomic History  . Marital status: Single    Spouse name: Not on file  . Number of children: Not on file  . Years of education: Not on file  . Highest education level: Not on file  Occupational History  . Occupation: Assembler    Comment: [employer]  Tobacco Use  . Smoking status: Former Smoker    Types: Cigarettes  . Smokeless tobacco: Never Used  . Tobacco comment: quit 1992  Substance and Sexual Activity  . Alcohol use: No    Alcohol/week: 0.0  standard drinks  . Drug use: No  . Sexual activity: Yes    Partners: Male    Birth control/protection: None  Other Topics Concern  . Not on file  Social History Narrative   She has 2 children (grown). She did not retain custody of these children.   Lives with boyfriend in [place].   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 CESAREAN SECTION  2001 & 2002  . ELBOW SURGERY    . MINOR CARPAL TUNNEL     pinched nerve in elbow  . WISDOM TOOTH EXTRACTION      Family History  Problem Relation Age of Onset  . Hypertension Mother        Living  . Arthritis Mother   . Thyroid disease Mother   . Heart disease Mother        MI at  age 27  . Heart attack Mother   . Migraines Mother   . Depression Mother   . Obesity Mother   . Congestive Heart Failure Father        ?CHF  . Hypertension Maternal Grandmother   . Parkinson's disease Maternal Grandmother   . Alzheimer's disease Maternal Grandmother   . Cancer Maternal Grandfather   . Hypertension Maternal Grandfather   . Hypertension Paternal Grandmother   . Hypertension Paternal Grandfather   . Gout Brother   . ADD / ADHD Son        x1  . Other Son        #2-Unknown  . Diabetes Neg Hx     Allergies  Allergen Reactions  . Adhesive [Tape] Other (See Comments)    Skin Burn.  [name] [Menthol (Topical Analgesic)] Other (See Comments)    Skin Burn.  . Sulfa Antibiotics Rash    Current Outpatient Medications on File Prior to Visit  Medication Sig Dispense Refill  . ALPRAZolam (XANAX) 0.25 MG tablet TAKE 1 TABLET BY MOUTH TWICE DAILY AS NEEDED FOR ANXIETY AND FOR SLEEP 30 tablet 0  .  amLODipine (NORVASC) 5 MG tablet Take 1 tablet by mouth once daily 90 tablet 1  . fluticasone (FLONASE) 50 MCG/ACT nasal spray Place 2 sprays into both nostrils daily. 16 g 6  . levothyroxine (EUTHYROX) 75 MCG tablet Take 1 tablet (75 mcg total) by mouth daily before breakfast. 30 tablet 3  . lisinopril (ZESTRIL) 10 MG tablet Take 1 tablet by mouth once daily 90 tablet 1  . megestrol (MEGACE) 40 MG tablet Take 1 tablet (40 mg total) by mouth 2 (two) times daily. Can increase to two tablets twice a day in the event of heavy bleeding 60 tablet 5  . montelukast (SINGULAIR) 10 MG tablet TAKE 1 TABLET BY MOUTH AT BEDTIME 90 tablet 1  . oxybutynin (DITROPAN-XL) 5 MG 24 hr tablet Take 1 tablet (5 mg total) by mouth daily. 30 tablet 5  . pantoprazole (PROTONIX) 40 MG tablet Take 1 tablet (40 mg total) by mouth daily. 30 tablet 3  . sertraline (ZOLOFT) 100 MG tablet Take 1 tablet by mouth once daily 90 tablet 1  . traZODone (DESYREL) 100 MG tablet Take 1 tablet (100 mg total) by mouth at bedtime. 90 tablet 1   No current facility-administered medications on file prior to visit.    There were no vitals taken for this visit.      Objective:   Physical Exam Constitutional:      Appearance: She is well-developed and well-nourished.  Eyes:     Extraocular Movements: Extraocular movements intact.     Pupils: Pupils are equal, round, and reactive to light.  Neck:     Thyroid: No thyromegaly.  Cardiovascular:     Rate and Rhythm: Normal rate and regular rhythm.     Heart sounds: Normal heart sounds. No murmur heard.   Pulmonary:     Effort: Pulmonary effort is normal. No respiratory distress.     Breath sounds: Normal breath sounds. No wheezing.  Musculoskeletal:     Cervical back: Neck supple.  Skin:    General: Skin is warm and dry.  Neurological:     Mental Status: She is alert and oriented to person, place, and time.     Cranial Nerves: No cranial nerve deficit.     Motor: No weakness.   Psychiatric:        Mood and Affect: Mood and  affect normal.        Behavior: Behavior normal.        Thought Content: Thought content normal.        Judgment: Judgment normal.           Assessment & Plan:  Headache-New.  I suspect that her HA is due to left sided trigeminal neuralgia. Will give trial of carbamazepine and plan to bring her back in 2 weeks for follow up. In addition, will place a referral to neurology for further evaluation.   This visit occurred during the SARS-CoV-2 public health emergency.  Safety protocols were in place, including screening questions prior to the visit, additional usage of staff PPE, and extensive cleaning of exam room while observing appropriate contact time as indicated for disinfecting solutions.

## 2020-03-02 ENCOUNTER — Ambulatory Visit: Payer: 59 | Admitting: Family

## 2020-03-12 ENCOUNTER — Encounter: Payer: Self-pay | Admitting: Family

## 2020-03-13 MED ORDER — PANTOPRAZOLE SODIUM 40 MG PO TBEC
40.0000 mg | DELAYED_RELEASE_TABLET | Freq: Two times a day (BID) | ORAL | 1 refills | Status: DC
Start: 2020-03-13 — End: 2020-03-13

## 2020-03-13 MED ORDER — PANTOPRAZOLE SODIUM 40 MG PO TBEC: 40.0000 mg | DELAYED_RELEASE_TABLET | Freq: Two times a day (BID) | ORAL | 0 refills | Status: DC

## 2020-03-13 NOTE — Addendum Note (Signed)
Addended byDamita Dunnings D on: 03/13/2020 11:24 AM   Modules accepted: Orders

## 2020-03-19 ENCOUNTER — Other Ambulatory Visit: Payer: Self-pay | Admitting: Family

## 2020-03-19 ENCOUNTER — Other Ambulatory Visit: Payer: Self-pay

## 2020-03-19 NOTE — Progress Notes (Signed)
Requesting:  Alprazolam Contract: 07/22/19 UDS: 07/22/19 Last Visit: 02/17/20 Next Visit: none  Last Refill: 02/11/20  Please Advise

## 2020-03-21 MED ORDER — ALPRAZOLAM 0.25 MG PO TABS: ORAL_TABLET | ORAL | 0 refills | Status: DC

## 2020-03-23 ENCOUNTER — Ambulatory Visit: Payer: 59 | Admitting: Physical Therapy

## 2020-03-27 ENCOUNTER — Encounter: Payer: Self-pay | Admitting: Family

## 2020-03-27 MED ORDER — METHOCARBAMOL 500 MG PO TABS: 500.0000 mg | ORAL_TABLET | Freq: Every evening | ORAL | 0 refills | Status: DC | PRN

## 2020-04-04 ENCOUNTER — Encounter: Payer: Self-pay | Admitting: Family

## 2020-04-08 ENCOUNTER — Encounter (HOSPITAL_BASED_OUTPATIENT_CLINIC_OR_DEPARTMENT_OTHER): Payer: Self-pay

## 2020-04-08 ENCOUNTER — Emergency Department (HOSPITAL_BASED_OUTPATIENT_CLINIC_OR_DEPARTMENT_OTHER)
Admission: EM | Admit: 2020-04-08 | Discharge: 2020-04-08 | Disposition: A | Discharge status: Home / Self Care | Payer: 59 | Attending: Emergency Medicine | Admitting: Emergency Medicine

## 2020-04-08 ENCOUNTER — Other Ambulatory Visit: Payer: Self-pay

## 2020-04-08 ENCOUNTER — Emergency Department (HOSPITAL_BASED_OUTPATIENT_CLINIC_OR_DEPARTMENT_OTHER): Payer: 59

## 2020-04-08 DIAGNOSIS — R600 Localized edema: Secondary | ICD-10-CM | POA: Insufficient documentation

## 2020-04-08 DIAGNOSIS — Z79899 Other long term (current) drug therapy: Secondary | ICD-10-CM | POA: Insufficient documentation

## 2020-04-08 DIAGNOSIS — M79671 Pain in right foot: Secondary | ICD-10-CM | POA: Diagnosis present

## 2020-04-08 DIAGNOSIS — R52 Pain, unspecified: Secondary | ICD-10-CM

## 2020-04-08 DIAGNOSIS — E039 Hypothyroidism, unspecified: Secondary | ICD-10-CM | POA: Diagnosis not present

## 2020-04-08 DIAGNOSIS — Z87891 Personal history of nicotine dependence: Secondary | ICD-10-CM | POA: Insufficient documentation

## 2020-04-08 DIAGNOSIS — I10 Essential (primary) hypertension: Secondary | ICD-10-CM | POA: Insufficient documentation

## 2020-04-08 MED ORDER — NAPROXEN 500 MG PO TABS: 500.0000 mg | ORAL_TABLET | Freq: Two times a day (BID) | ORAL | 0 refills | Status: AC

## 2020-04-08 NOTE — ED Provider Notes (Signed)
Sharon Rivers   CSN: [id number] Arrival date & time: 04/08/20  1545     History Chief Complaint  Patient presents with  . Foot Pain    Sharon Rivers is a 50 y.o. female.  HPI 50 year old female with a history of depression, edema, GERD, DVT, hypertension, hypothyroidism, OSA, anxiety presents to the ER with complaints of right foot pain since Monday.  She denies any injuries or any inciting incident.  She cannot really locate the pain, states her foot hurts "all over".  She feels as though it is swollen.  She denies any warmth, erythema, fevers.  She feels as though her foot is cold.  She does report some numbness but states that this is chronic.  No history of diabetes.  She has taken some Tylenol for pain with little relief.  She has been able to ambulate but with pain.  No noticeable swelling of her legs.  She is not on anticoagulation.    Past Medical History:  Diagnosis Date  . Anxiety   . Back pain   . Chest pain    a. 07/2015 Myoview: EF 71%, medium size, mild intensity, partially reversible septal defect with overlying breast attenuation -> likely artifact. No significant reversible ischemia.  . Constipation   . Depression   . Edema    feet and legs  . Frequent headaches   . GERD (gastroesophageal reflux disease)   . History of DVT (deep vein thrombosis) 2018  . Hypertension   . Hypothyroidism   . Insulin resistance   . Joint pain   . Migraines   . OSA (obstructive sleep apnea) 03/17/2016  . Osteoarthritis   . Panic anxiety syndrome   . SOB (shortness of breath)    a. 04/2016 Echo: EF 60-65%, Gr1 DD.  [name] Swallowing difficulty     Patient Active Problem List   Diagnosis Date Noted  . Essential hypertension 03/08/2017  . Other specified hypothyroidism 03/08/2017  . Class 3 obesity with serious comorbidity and body mass index (BMI) of 45.0 to 49.9 in adult 09/26/2016  . Insulin resistance 06/13/2016  . Vitamin D deficiency  04/12/2016  . OSA (obstructive sleep apnea) 03/17/2016  . Insomnia 02/23/2016  . GERD (gastroesophageal reflux disease) 02/23/2016  . Migraines 02/23/2016  . Depression 02/23/2016  . Hypertension 07/22/2015  . Hypothyroid 07/22/2015  . Family history of early CAD 07/22/2015  . Bilateral carpal tunnel syndrome 04/30/2015  . Headache 12/12/2013  . Heartburn 12/12/2013  . Morbid obesity ([place]) 09/12/2013  . Chondromalacia patellae 09/12/2013  . Arthropathy 07/14/2012    Past Surgical History:  Procedure Laterality Date  . CESAREAN SECTION  2001 & 2002  . ELBOW SURGERY    . MINOR CARPAL TUNNEL     pinched nerve in elbow  . WISDOM TOOTH EXTRACTION       OB History    Gravida  3   Para  2   Term  2   Preterm      AB  1   Living  2     SAB  0   IAB      Ectopic      Multiple      Live Births  2           Family History  Problem Relation Age of Onset  . Hypertension Mother        Living  . Arthritis Mother   . Thyroid disease Mother   . Heart disease  Mother        MI at age 101  . Heart attack Mother   . Migraines Mother   . Depression Mother   . Obesity Mother   . Congestive Heart Failure Father        ?CHF  . Hypertension Maternal Grandmother   . Parkinson's disease Maternal Grandmother   . Alzheimer's disease Maternal Grandmother   . Cancer Maternal Grandfather   . Hypertension Maternal Grandfather   . Hypertension Paternal Grandmother   . Hypertension Paternal Grandfather   . Gout Brother   . ADD / ADHD Son        x1  . Other Son        #2-Unknown  . Diabetes Neg Hx     Social History   Tobacco Use  . Smoking status: Former Smoker    Types: Cigarettes  . Smokeless tobacco: Never Used  . Tobacco comment: quit 1992  Substance Use Topics  . Alcohol use: No    Alcohol/week: 0.0 standard drinks  . Drug use: No    Home Medications Prior to Admission medications   Medication Sig Start Date End Date Taking? Authorizing Provider   naproxen (NAPROSYN) 500 MG tablet Take 1 tablet (500 mg total) by mouth 2 (two) times daily for 7 days. 04/08/20 04/15/20 Yes Garald Balding, PA-C  ALPRAZolam (XANAX) 0.25 MG tablet TAKE 1 TABLET BY MOUTH TWICE DAILY AS NEEDED FOR ANXIETY AND FOR SLEEP (do not take with hydrocodone) 03/21/20   [name], NP  amLODipine (NORVASC) 5 MG tablet Take 1 tablet by mouth once daily 11/17/19   [name], NP  carbamazepine (TEGRETOL XR) 100 MG 12 hr tablet Take 1 tablet (100 mg total) by mouth 2 (two) times daily. 02/17/20   [name], NP  fluticasone (FLONASE) 50 MCG/ACT nasal spray Place 2 sprays into both nostrils daily. 10/21/19   Ann Held, DO  levothyroxine (EUTHYROX) 75 MCG tablet Take 1 tablet (75 mcg total) by mouth daily before breakfast. 02/10/20   [name], NP  lisinopril (ZESTRIL) 10 MG tablet Take 1 tablet by mouth once daily 11/17/19   [name], NP  megestrol (MEGACE) 40 MG tablet Take 1 tablet (40 mg total) by mouth 2 (two) times daily. Can increase to two tablets twice a day in the event of heavy bleeding 12/16/19   Lavonia Drafts, MD  methocarbamol (ROBAXIN) 500 MG tablet Take 1 tablet (500 mg total) by mouth at bedtime as needed for muscle spasms. 03/27/20   [name], NP  montelukast (SINGULAIR) 10 MG tablet TAKE 1 TABLET BY MOUTH AT BEDTIME 11/17/19   [name], NP  oxybutynin (DITROPAN-XL) 5 MG 24 hr tablet Take 1 tablet (5 mg total) by mouth daily. 01/16/20   Jaquita Folds, MD  pantoprazole (PROTONIX) 40 MG tablet Take 1 tablet (40 mg total) by mouth 2 (two) times daily. 03/13/20   [name], NP  sertraline (ZOLOFT) 100 MG tablet Take 1 tablet by mouth once daily 11/17/19   [name], NP  traZODone (DESYREL) 100 MG tablet Take 1 tablet (100 mg total) by mouth at bedtime. 11/14/19   [name], NP    Allergies    Adhesive [tape], Biofreeze [menthol (topical analgesic)],  and Sulfa antibiotics  Review of Systems   Review of Systems  Constitutional: Negative for chills and fever.  Musculoskeletal: Positive for arthralgias. Negative for joint swelling, neck pain and neck stiffness.  Skin: Negative for color change.  Neurological: Positive  for numbness. Negative for weakness.    Physical Exam Updated Vital Signs BP (!) 155/100 (BP Location: Left Arm)   Pulse 78   Temp 98.1 F (36.7 C) (Oral)   Resp 18   Ht 5\' 2"  (1.575 m)   Wt 127 kg   SpO2 100%   BMI 51.21 kg/m   Physical Exam Vitals reviewed.  Constitutional:      Appearance: Normal appearance.  HENT:     Head: Normocephalic and atraumatic.  Eyes:     General:        Right eye: No discharge.        Left eye: No discharge.     Extraocular Movements: Extraocular movements intact.     Conjunctiva/sclera: Conjunctivae normal.  Musculoskeletal:        General: No swelling, tenderness, deformity or signs of injury. Normal range of motion.     Right lower leg: Edema present.     Left lower leg: No edema.     Comments: Right foot with trace edema.  Patient is hesitant to dorsiflex and plantarflex her ankle, however on passive range of motion she is able to fully range it.  DP pulses and plantar pulses noted with Doppler Doppler, cap refill<2.  No significant erythema, warmth.  Able to move all 5 digits.  No erythema or swelling to the ankle.  No calf swelling.  Sensations intact.  No evidence of injury.  Skin:    Capillary Refill: Capillary refill takes less than 2 seconds.     Findings: No erythema or rash.  Neurological:     General: No focal deficit present.     Mental Status: She is alert and oriented to person, place, and time.  Psychiatric:        Mood and Affect: Mood normal.        Behavior: Behavior normal.     ED Results / Procedures / Treatments   Labs (all labs ordered are listed, but only abnormal results are displayed) Labs Reviewed - No data to  display  EKG None  Radiology DG Foot Complete Left  Result Date: 04/08/2020 CLINICAL DATA:  Left foot pain and swelling for 3 days EXAM: LEFT FOOT - COMPLETE 3+ VIEW COMPARISON:  01/07/2018 FINDINGS: Frontal, oblique, and lateral views of the left foot are obtained. No fracture, subluxation, or dislocation. Joint spaces are well preserved. Prominent superior and inferior calcaneal spurs unchanged. Soft tissues are unremarkable. IMPRESSION: 1. No acute displaced fracture. Electronically Signed   By: Randa Ngo M.D.   On: 04/08/2020 16:46    Procedures Procedures   Medications Ordered in ED Medications - No data to display  ED Course  I have reviewed the triage vital signs and the nursing notes.  Pertinent labs & imaging results that were available during my care of the patient were reviewed by me and considered in my medical decision making (see chart for details).    MDM Rules/Calculators/A&P                         50 year old female presents to the ER with complaints of right foot pain.  No inciting incident, cannot really characterize the pain.  Physical exam with intact pulses via Doppler.  She has good cap refill.  No evidence of calf swelling.  Low suspicion for ischemic limb, DVT at this time.  She has no evidence of septic joint or cellulitis to the foot.  She is still able to bear  weight, has full range of motion of her toes.  Plain films without evidence of acute abnormality.  Patient does report that she has a job in which she is constantly on her feet, has to do many repetitive motions.  When pressed, she says that this may be more so hurting in the bottom of her foot.  Question plantar fasciitis.  Will prescribe naproxen, encouraged her to follow-up with Dr. Raeford Razor with orthopedics here in [facility].  Return precautions discussed.  She voiced understanding and is agreeable.  Stable for discharge.  This was a shared visit with my supervising physician Dr.Long who  independently saw and evaluated the patient & provided guidance in evaluation/management/disposition ,in agreement with care   Final Clinical Impression(s) / ED Diagnoses Final diagnoses:  Pain  Foot pain, right    Rx / DC Orders ED Discharge Orders         Ordered    naproxen (NAPROSYN) 500 MG tablet  2 times daily        04/08/20 1803           Lyndel Safe 04/08/20 1807    Margette Fast, MD 04/16/20 1012

## 2020-04-08 NOTE — ED Triage Notes (Signed)
Pt complaining of left foot pain since Monday. Swelling and pain. No known injury

## 2020-04-08 NOTE — Discharge Instructions (Signed)
Please take the naproxen as directed for pain.  When in combination with your sertraline, there is an increased risk of stomach bleed with this medication. To help prevent this, take with food and do not take for more than a week.  Please follow-up with Dr. Raeford Razor for further evaluation of your foot pain.  You may also apply ice, elevate your foot.  Return to the ER for any new or worsening symptoms peer

## 2020-04-09 ENCOUNTER — Encounter: Payer: Self-pay | Admitting: Family Medicine

## 2020-04-09 ENCOUNTER — Ambulatory Visit (INDEPENDENT_AMBULATORY_CARE_PROVIDER_SITE_OTHER): Payer: 59 | Admitting: Family Medicine

## 2020-04-09 ENCOUNTER — Ambulatory Visit: Payer: Self-pay

## 2020-04-09 VITALS — BP 124/88 | Ht 62.0 in | Wt 280.0 lb

## 2020-04-09 DIAGNOSIS — M65979 Unspecified synovitis and tenosynovitis, unspecified ankle and foot: Secondary | ICD-10-CM | POA: Insufficient documentation

## 2020-04-09 DIAGNOSIS — M79672 Pain in left foot: Secondary | ICD-10-CM

## 2020-04-09 DIAGNOSIS — M659 Synovitis and tenosynovitis, unspecified: Secondary | ICD-10-CM

## 2020-04-09 MED ORDER — PREDNISONE 5 MG PO TABS
ORAL_TABLET | ORAL | 0 refills | Status: DC
Start: 2020-04-09 — End: 2020-04-21

## 2020-04-09 NOTE — Progress Notes (Signed)
Sharon Rivers - 50 y.o. female MRN [id number]  Date of birth: Feb 22, 1970  SUBJECTIVE:  Including CC & ROS.  No chief complaint on file.   Sharon Rivers is a 50 y.o. female that is presenting with left foot pain.  The pain has been ongoing for about a day.  She feels it most near the toe.  Denies any injury or inciting event.  Pain is worse with ambulation.  No history of surgery..  Independent review of the left foot x-ray from 3/2 shows calcaneal heel spur   Review of Systems See HPI   HISTORY: Past Medical, Surgical, Social, and Family History Reviewed & Updated per EMR.   Pertinent Historical Findings include:  Past Medical History:  Diagnosis Date  . Anxiety   . Back pain   . Chest pain    a. 07/2015 Myoview: EF 71%, medium size, mild intensity, partially reversible septal defect with overlying breast attenuation -> likely artifact. No significant reversible ischemia.  . Constipation   . Depression   . Edema    feet and legs  . Frequent headaches   . GERD (gastroesophageal reflux disease)   . History of DVT (deep vein thrombosis) 2018  . Hypertension   . Hypothyroidism   . Insulin resistance   . Joint pain   . Migraines   . OSA (obstructive sleep apnea) 03/17/2016  . Osteoarthritis   . Panic anxiety syndrome   . SOB (shortness of breath)    a. 04/2016 Echo: EF 60-65%, Gr1 DD.  [name] Swallowing difficulty     Past Surgical History:  Procedure Laterality Date  . CESAREAN SECTION  2001 & 2002  . ELBOW SURGERY    . MINOR CARPAL TUNNEL     pinched nerve in elbow  . WISDOM TOOTH EXTRACTION      Family History  Problem Relation Age of Onset  . Hypertension Mother        Living  . Arthritis Mother   . Thyroid disease Mother   . Heart disease Mother        MI at age 77  . Heart attack Mother   . Migraines Mother   . Depression Mother   . Obesity Mother   . Congestive Heart Failure Father        ?CHF  . Hypertension Maternal Grandmother   . Parkinson's disease  Maternal Grandmother   . Alzheimer's disease Maternal Grandmother   . Cancer Maternal Grandfather   . Hypertension Maternal Grandfather   . Hypertension Paternal Grandmother   . Hypertension Paternal Grandfather   . Gout Brother   . ADD / ADHD Son        x1  . Other Son        #2-Unknown  . Diabetes Neg Hx     Social History   Socioeconomic History  . Marital status: Single    Spouse name: Not on file  . Number of children: Not on file  . Years of education: Not on file  . Highest education level: Not on file  Occupational History  . Occupation: Assembler    Comment: [employer]  Tobacco Use  . Smoking status: Former Smoker    Types: Cigarettes  . Smokeless tobacco: Never Used  . Tobacco comment: quit 1992  Substance and Sexual Activity  . Alcohol use: No    Alcohol/week: 0.0 standard drinks  . Drug use: No  . Sexual activity: Yes    Partners: Male    Birth control/protection: None  Other Topics Concern  . Not on file  Social History Narrative   She has 2 children (grown). She did not retain custody of these children.   Lives with boyfriend in [place].   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 Tenderness to palpation at the second MTP joint. Normal strength resistance. Neurovascular intact  Limited ultrasound: left foot:  No changes at the first MTP joint. There appears Mild hyperemia near the second MTP joint.   There appears to be hyperemia in the soft tissue on either side of the second MTP joint. No changes of the third MTP joint  Summary: Findings most consistent with synovitis of the second MTP joint.  Ultrasound and interpretation by Clearance Coots, MD    ASSESSMENT & PLAN:   Synovitis of toe Has inflammatory changes near the second MTP joint.  Seems most consistent with a synovitis.  No suggestion of stress fracture -Counseled on home exercise therapy and supportive care. -Prednisone. -Postop shoe. -Provided work note

## 2020-04-09 NOTE — Assessment & Plan Note (Signed)
Has inflammatory changes near the second MTP joint.  Seems most consistent with a synovitis.  No suggestion of stress fracture -Counseled on home exercise therapy and supportive care. -Prednisone. -Postop shoe. -Provided work note

## 2020-04-09 NOTE — Patient Instructions (Signed)
Nice to meet you Please try ice  Please try the hard soled shoe  Please try the prednisone  Please send me a message in MyChart with any questions or updates.  Please see me back in 3 weeks.   --Dr. Raeford Razor

## 2020-04-14 ENCOUNTER — Other Ambulatory Visit: Payer: Self-pay

## 2020-04-14 ENCOUNTER — Ambulatory Visit (INDEPENDENT_AMBULATORY_CARE_PROVIDER_SITE_OTHER): Payer: 59 | Admitting: Family

## 2020-04-14 VITALS — BP 150/70 | HR 76 | Temp 98.3°F | Ht 62.0 in | Wt 294.4 lb

## 2020-04-14 DIAGNOSIS — E039 Hypothyroidism, unspecified: Secondary | ICD-10-CM

## 2020-04-14 DIAGNOSIS — I10 Essential (primary) hypertension: Secondary | ICD-10-CM | POA: Diagnosis not present

## 2020-04-14 DIAGNOSIS — J309 Allergic rhinitis, unspecified: Secondary | ICD-10-CM

## 2020-04-14 MED ORDER — AZELASTINE HCL 0.1 % NA SOLN: 1.0000 | Freq: Two times a day (BID) | NASAL | 5 refills | Status: AC

## 2020-04-14 NOTE — Progress Notes (Signed)
Subjective:    Patient ID: Sharon Rivers, female    DOB: 1971-01-26, 50 y.o.   MRN: [id number]  HPI  Patient is a 50 yr old female who presents today with c/o sinus drainage. Has been present for several months. Some tickle at the back of her throat. Sneezing.  Using 24 hr allergy tabs from [facility] which are not working currently.  Denies sinus pain/pressure.  She is using flonase, singulair and generic allergy tablet (?claritin).    Hypothyroid-  Lab Results  Component Value Date   TSH 2.87 09/02/2019    Review of Systems    see HPI  Past Medical History:  Diagnosis Date  . Anxiety   . Back pain   . Chest pain    a. 07/2015 Myoview: EF 71%, medium size, mild intensity, partially reversible septal defect with overlying breast attenuation -> likely artifact. No significant reversible ischemia.  . Constipation   . Depression   . Edema    feet and legs  . Frequent headaches   . GERD (gastroesophageal reflux disease)   . History of DVT (deep vein thrombosis) 2018  . Hypertension   . Hypothyroidism   . Insulin resistance   . Joint pain   . Migraines   . OSA (obstructive sleep apnea) 03/17/2016  . Osteoarthritis   . Panic anxiety syndrome   . SOB (shortness of breath)    a. 04/2016 Echo: EF 60-65%, Gr1 DD.  [name] Swallowing difficulty      Social History   Socioeconomic History  . Marital status: Single    Spouse name: Not on file  . Number of children: Not on file  . Years of education: Not on file  . Highest education level: Not on file  Occupational History  . Occupation: Assembler    Comment: [employer]  Tobacco Use  . Smoking status: Former Smoker    Types: Cigarettes  . Smokeless tobacco: Never Used  . Tobacco comment: quit 1992  Substance and Sexual Activity  . Alcohol use: No    Alcohol/week: 0.0 standard drinks  . Drug use: No  . Sexual activity: Yes    Partners: Male    Birth control/protection: None  Other Topics Concern  . Not on file  Social  History Narrative   She has 2 children (grown). She did not retain custody of these children.   Lives with boyfriend in [place].   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 CESAREAN SECTION  2001 & 2002  . ELBOW SURGERY    . MINOR CARPAL TUNNEL     pinched nerve in elbow  . WISDOM TOOTH EXTRACTION      Family History  Problem Relation Age of Onset  . Hypertension Mother        Living  . Arthritis Mother   . Thyroid disease Mother   . Heart disease Mother        MI at age 46  . Heart attack Mother   . Migraines Mother   . Depression Mother   . Obesity Mother   . Congestive Heart Failure Father        ?  CHF  . Hypertension Maternal Grandmother   . Parkinson's disease Maternal Grandmother   . Alzheimer's disease Maternal Grandmother   . Cancer Maternal Grandfather   . Hypertension Maternal Grandfather   . Hypertension Paternal Grandmother   . Hypertension Paternal Grandfather   . Gout Brother   . ADD / ADHD Son        x1  . Other Son        #2-Unknown  . Diabetes Neg Hx     Allergies  Allergen Reactions  . Adhesive [Tape] Other (See Comments)    Skin Burn.  [name] [Menthol (Topical Analgesic)] Other (See Comments)    Skin Burn.  . Sulfa Antibiotics Rash    Current Outpatient Medications on File Prior to Visit  Medication Sig Dispense Refill  . ALPRAZolam (XANAX) 0.25 MG tablet TAKE 1 TABLET BY MOUTH TWICE DAILY AS NEEDED FOR ANXIETY AND FOR SLEEP (do not take with hydrocodone) 30 tablet 0  . amLODipine (NORVASC) 5 MG tablet Take 1 tablet by mouth once daily 90 tablet 1  . carbamazepine (TEGRETOL XR) 100 MG 12 hr tablet  Take 1 tablet (100 mg total) by mouth 2 (two) times daily. 60 tablet 1  . fluticasone (FLONASE) 50 MCG/ACT nasal spray Place 2 sprays into both nostrils daily. 16 g 6  . levothyroxine (EUTHYROX) 75 MCG tablet Take 1 tablet (75 mcg total) by mouth daily before breakfast. 30 tablet 3  . lisinopril (ZESTRIL) 10 MG tablet Take 1 tablet by mouth once daily 90 tablet 1  . megestrol (MEGACE) 40 MG tablet Take 1 tablet (40 mg total) by mouth 2 (two) times daily. Can increase to two tablets twice a day in the event of heavy bleeding 60 tablet 5  . methocarbamol (ROBAXIN) 500 MG tablet Take 1 tablet (500 mg total) by mouth at bedtime as needed for muscle spasms. 15 tablet 0  . montelukast (SINGULAIR) 10 MG tablet TAKE 1 TABLET BY MOUTH AT BEDTIME 90 tablet 1  . naproxen (NAPROSYN) 500 MG tablet Take 1 tablet (500 mg total) by mouth 2 (two) times daily for 7 days. 14 tablet 0  . oxybutynin (DITROPAN-XL) 5 MG 24 hr tablet Take 1 tablet (5 mg total) by mouth daily. 30 tablet 5  . pantoprazole (PROTONIX) 40 MG tablet Take 1 tablet (40 mg total) by mouth 2 (two) times daily. 60 tablet 0  . predniSONE (DELTASONE) 5 MG tablet Take 6 pills for first day, 5 pills second day, 4 pills third day, 3 pills fourth day, 2 pills the fifth day, and 1 pill sixth day. 21 tablet 0  . sertraline (ZOLOFT) 100 MG tablet Take 1 tablet by mouth once daily 90 tablet 1  . traZODone (DESYREL) 100 MG tablet Take 1 tablet (100 mg total) by mouth at bedtime. 90 tablet 1   No current facility-administered medications on file prior to visit.    BP (!) 150/70   Pulse 76   Temp 98.3 F (36.8 C) (Oral)   Ht 5\' 2"  (1.575 m)   Wt 294 lb 6.4 oz (133.5 kg)   SpO2 99%   BMI 53.85 kg/m    Objective:   Physical Exam Constitutional:      Appearance: She is well-developed and well-nourished.  HENT:     Head: Normocephalic and atraumatic.     Right Ear: Tympanic membrane and ear canal normal.     Left Ear: Tympanic membrane and ear canal  normal.     Nose:  Right Sinus: No maxillary sinus tenderness or frontal sinus tenderness.     Left Sinus: No maxillary sinus tenderness or frontal sinus tenderness.  Neck:     Thyroid: No thyromegaly.  Cardiovascular:     Rate and Rhythm: Normal rate and regular rhythm.     Heart sounds: Normal heart sounds. No murmur heard.   Pulmonary:     Effort: Pulmonary effort is normal. No respiratory distress.     Breath sounds: Normal breath sounds. No wheezing.  Musculoskeletal:     Cervical back: Neck supple.  Skin:    General: Skin is warm and dry.  Neurological:     Mental Status: She is alert and oriented to person, place, and time.  Psychiatric:        Mood and Affect: Mood and affect normal.        Behavior: Behavior normal.        Thought Content: Thought content normal.        Judgment: Judgment normal.           Assessment & Plan:  Allergic rhinitis- No sign of bacterial sinusitis. Recommended the following:  Add azelastine spray one spray twice daily.  Continue flonase, allergy medication and singulair. Let me know if you don't see improvement.    HTN- bp is mildly elevated. Continue amlodipine 5mg . Plan to repeat at her 1 week follow up.  Hypothyroid- clinically stable on synthroid.  Lab is closed at time of her visit. She will book an earlier appointment next week for lab draw.   This visit occurred during the SARS-CoV-2 public health emergency.  Safety protocols were in place, including screening questions prior to the visit, additional usage of staff PPE, and extensive cleaning of exam room while observing appropriate contact time as indicated for disinfecting solutions.

## 2020-04-14 NOTE — Patient Instructions (Signed)
Add azelastine spray one spray twice daily.  Continue flonase, allergy medication and singulair. Let me know if you don't see improvement.

## 2020-04-17 ENCOUNTER — Telehealth (INDEPENDENT_AMBULATORY_CARE_PROVIDER_SITE_OTHER): Payer: 59 | Admitting: Family

## 2020-04-17 ENCOUNTER — Encounter: Payer: Self-pay | Admitting: Family

## 2020-04-17 DIAGNOSIS — R059 Cough, unspecified: Secondary | ICD-10-CM

## 2020-04-17 MED ORDER — BENZONATATE 100 MG PO CAPS: 100.0000 mg | ORAL_CAPSULE | Freq: Three times a day (TID) | ORAL | 0 refills | Status: DC | PRN

## 2020-04-17 MED ORDER — AZITHROMYCIN 250 MG PO TABS: ORAL_TABLET | ORAL | 0 refills | Status: DC

## 2020-04-17 NOTE — Progress Notes (Signed)
Virtual Visit via Video Note  I connected with Sharon Rivers on 04/17/20 at  2:40 PM EST by a video enabled telemedicine application and verified that I am speaking with the correct person using two identifiers.  Location: Patient: home Provider: work   I discussed the limitations of evaluation and management by telemedicine and the availability of in person appointments. The patient expressed understanding and agreed to proceed. Only the patient and myself were present for today's video call.   History of Present Illness:  Pt is a 50 yr old female with chief complaint of ough.   Allergic rhinitis- tried azelastine, seems a bit better.   Cough-  States that she had a mild cough initially which she attributed to "the drainage", but yesterday the cough worsened. She states she had trouble sleeping last night due to the cough.  Cough is described as dry. No sore throat or body aches. She has tried dayquil and nyquil without improvement.  She is not wheezing.    Observations/Objective:   Gen: Awake, alert, no acute distress Resp: Breathing is even and non-labored Psych: calm/pleasant demeanor Neuro: Alert and Oriented x 3, + facial symmetry, speech is clear.   Assessment and Plan:  Cough- I suspect a viral bronchitis.  However, if symptoms worsen, would consider antibiotics. Pt is advised as follows:  Please complete a home covid test. If cough worsens, or if not improved in 2-3 days, began azithromycin (antibiotic). For cough you may use tessalon pearls.   Follow Up Instructions:    I discussed the assessment and treatment plan with the patient. The patient was provided an opportunity to ask questions and all were answered. The patient agreed with the plan and demonstrated an understanding of the instructions.   The patient was advised to call back or seek an in-person evaluation if the symptoms worsen or if the condition fails to improve as anticipated.  [name],  NP

## 2020-04-17 NOTE — Patient Instructions (Addendum)
Please complete a home covid test. If cough worsens, or if not improved in 2-3 days, began azithromycin (antibiotic). For cough you may use tessalon pearls.

## 2020-04-17 NOTE — Telephone Encounter (Signed)
Let's try to get her scheduled for a virtual visit this afternoon.

## 2020-04-17 NOTE — Telephone Encounter (Signed)
Patient was scheduled for this afternoon.

## 2020-04-21 ENCOUNTER — Other Ambulatory Visit: Payer: Self-pay

## 2020-04-21 ENCOUNTER — Ambulatory Visit (INDEPENDENT_AMBULATORY_CARE_PROVIDER_SITE_OTHER): Payer: 59 | Admitting: Family

## 2020-04-21 VITALS — BP 136/87 | HR 85 | Temp 98.5°F | Resp 16 | Ht 62.0 in | Wt 292.0 lb

## 2020-04-21 DIAGNOSIS — E039 Hypothyroidism, unspecified: Secondary | ICD-10-CM

## 2020-04-21 DIAGNOSIS — I10 Essential (primary) hypertension: Secondary | ICD-10-CM

## 2020-04-21 DIAGNOSIS — R059 Cough, unspecified: Secondary | ICD-10-CM

## 2020-04-21 DIAGNOSIS — G500 Trigeminal neuralgia: Secondary | ICD-10-CM

## 2020-04-21 DIAGNOSIS — Z1211 Encounter for screening for malignant neoplasm of colon: Secondary | ICD-10-CM

## 2020-04-21 DIAGNOSIS — G4700 Insomnia, unspecified: Secondary | ICD-10-CM

## 2020-04-21 DIAGNOSIS — G4733 Obstructive sleep apnea (adult) (pediatric): Secondary | ICD-10-CM

## 2020-04-21 DIAGNOSIS — F3289 Other specified depressive episodes: Secondary | ICD-10-CM

## 2020-04-21 DIAGNOSIS — F419 Anxiety disorder, unspecified: Secondary | ICD-10-CM

## 2020-04-21 HISTORY — DX: Trigeminal neuralgia: G50.0

## 2020-04-21 MED ORDER — PROMETHAZINE-DM 6.25-15 MG/5ML PO SYRP: 5.0000 mL | ORAL_SOLUTION | Freq: Four times a day (QID) | ORAL | 0 refills | Status: DC | PRN

## 2020-04-21 NOTE — Patient Instructions (Addendum)
Please complete lab work piror to leaving.  Let me know if your cough does not continue to improve.

## 2020-04-21 NOTE — Progress Notes (Signed)
Subjective:    Patient ID: Sharon Rivers, female    DOB: 03-13-1970, 50 y.o.   MRN: [id number]  HPI  Patient is a 50 yr old female who presents today for follow up.  HTN-patient is maintained on amlodipine 5 mg once daily.   BP Readings from Last 3 Encounters:  04/21/20 136/87  04/14/20 (!) 150/70  04/09/20 124/88   Cough-patient reports that her cough is improving.  Depression/anxiety- on zoloft.  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  Notes a lot of life stressors.  Hypothyroid-she is maintained on Synthroid 75 mcg p.o. daily. Lab Results  Component Value Date   TSH 2.87 09/02/2019   Uses xanax 1 tablet at night with trazodone.  Helps to calm her thoughts down.    Trigeminal Neuralgia- improved on carbamazepine. She has apt with neurology.   OSA-   Review of Systems See HPI  Past Medical History:  Diagnosis Date  . Anxiety   . Back pain   . Chest pain    a. 07/2015 Myoview: EF 71%, medium size, mild intensity, partially reversible septal defect with overlying breast attenuation -> likely artifact. No significant reversible ischemia.  . Constipation   . Depression   . Edema    feet and legs  . Frequent headaches   . GERD (gastroesophageal reflux disease)   . History of DVT (deep vein thrombosis) 2018  . Hypertension   . Hypothyroidism   . Insulin resistance   . Joint pain   . Migraines   . OSA (obstructive sleep apnea) 03/17/2016  . Osteoarthritis   . Panic anxiety syndrome   . SOB (shortness of breath)    a. 04/2016 Echo: EF 60-65%, Gr1 DD.  [name] Swallowing difficulty      Social History   Socioeconomic History  . Marital status: Single    Spouse name: Not on file  . Number of children: Not on file  . Years of education: Not on file  . Highest education level: Not on file  Occupational History  . Occupation: Assembler    Comment: [employer]  Tobacco Use  . Smoking status: Former Smoker    Types:  Cigarettes  . Smokeless tobacco: Never Used  . Tobacco comment: quit 1992  Substance and Sexual Activity  . Alcohol use: No    Alcohol/week: 0.0 standard drinks  . Drug use: No  . Sexual activity: Yes    Partners: Male    Birth control/protection: None  Other Topics Concern  . Not on file  Social History Narrative   She has 2 children (grown). She did not retain custody of these children.   Lives with boyfriend in [place].   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 CESAREAN SECTION  2001 & 2002  . ELBOW SURGERY    . MINOR CARPAL TUNNEL     pinched nerve in elbow  . WISDOM TOOTH EXTRACTION      Family History  Problem Relation Age of Onset  . Hypertension Mother        Living  . Arthritis Mother   .  Thyroid disease Mother   . Heart disease Mother        MI at age 7  . Heart attack Mother   . Migraines Mother   . Depression Mother   . Obesity Mother   . Congestive Heart Failure Father        ?CHF  . Hypertension Maternal Grandmother   . Parkinson's disease Maternal Grandmother   . Alzheimer's disease Maternal Grandmother   . Cancer Maternal Grandfather   . Hypertension Maternal Grandfather   . Hypertension Paternal Grandmother   . Hypertension Paternal Grandfather   . Gout Brother   . ADD / ADHD Son        x1  . Other Son        #2-Unknown  . Diabetes Neg Hx     Allergies  Allergen Reactions  . Adhesive [Tape] Other (See Comments)    Skin Burn.  [name] [Menthol (Topical Analgesic)] Other (See Comments)    Skin Burn.  . Sulfa Antibiotics Rash    Current Outpatient Medications on File Prior to Visit   Medication Sig Dispense Refill  . ALPRAZolam (XANAX) 0.25 MG tablet TAKE 1 TABLET BY MOUTH TWICE DAILY AS NEEDED FOR ANXIETY AND FOR SLEEP (do not take with hydrocodone) 30 tablet 0  . amLODipine (NORVASC) 5 MG tablet Take 1 tablet by mouth once daily 90 tablet 1  . azelastine (ASTELIN) 0.1 % nasal spray Place 1 spray into both nostrils 2 (two) times daily. Use in each nostril as directed 30 mL 5  . benzonatate (TESSALON) 100 MG capsule Take 1 capsule (100 mg total) by mouth 3 (three) times daily as needed. 20 capsule 0  . carbamazepine (TEGRETOL XR) 100 MG 12 hr tablet Take 1 tablet (100 mg total) by mouth 2 (two) times daily. 60 tablet 1  . fluticasone (FLONASE) 50 MCG/ACT nasal spray Place 2 sprays into both nostrils daily. 16 g 6  . levothyroxine (EUTHYROX) 75 MCG tablet Take 1 tablet (75 mcg total) by mouth daily before breakfast. 30 tablet 3  . lisinopril (ZESTRIL) 10 MG tablet Take 1 tablet by mouth once daily 90 tablet 1  . megestrol (MEGACE) 40 MG tablet Take 1 tablet (40 mg total) by mouth 2 (two) times daily. Can increase to two tablets twice a day in the event of heavy bleeding 60 tablet 5  . methocarbamol (ROBAXIN) 500 MG tablet Take 1 tablet (500 mg total) by mouth at bedtime as needed for muscle spasms. 15 tablet 0  . montelukast (SINGULAIR) 10 MG tablet TAKE 1 TABLET BY MOUTH AT BEDTIME 90 tablet 1  . oxybutynin (DITROPAN-XL) 5 MG 24 hr tablet Take 1 tablet (5 mg total) by mouth daily. 30 tablet 5  . pantoprazole (PROTONIX) 40 MG tablet Take 1 tablet (40 mg total) by mouth 2 (two) times daily. 60 tablet 0  . sertraline (ZOLOFT) 100 MG tablet Take 1 tablet by mouth once daily 90 tablet 1  . traZODone (DESYREL) 100 MG tablet Take 1 tablet (100 mg total) by mouth at bedtime. 90 tablet 1   No current facility-administered medications on file prior to visit.    BP 136/87 (BP Location: Right Arm, Patient Position: Sitting, Cuff Size: Large)   Pulse 85   Temp 98.5 F (36.9 C) (Oral)    Resp 16   Ht 5\' 2"  (1.575 m)   Wt 292 lb (132.5 kg)   SpO2 100%   BMI 53.41 kg/m       Objective:  Physical Exam Constitutional:      Appearance: She is well-developed.  Neck:     Thyroid: No thyromegaly.  Cardiovascular:     Rate and Rhythm: Normal rate and regular rhythm.     Heart sounds: Normal heart sounds. No murmur heard.   Pulmonary:     Effort: Pulmonary effort is normal. No respiratory distress.     Breath sounds: Normal breath sounds. No wheezing.  Musculoskeletal:     Cervical back: Neck supple.  Skin:    General: Skin is warm and dry.  Neurological:     Mental Status: She is alert and oriented to person, place, and time.  Psychiatric:        Behavior: Behavior normal.        Thought Content: Thought content normal.        Judgment: Judgment normal.           Assessment & Plan:  Hypertension-blood pressure is improved today.  Continue amlodipine 5 mg once daily.  Cough-this is improving.  Continue to monitor.  She had some Promethazine DM cough syrup at home which she has found helpful and requests a refill.  Refill provided.  Depression-I think that this is exacerbated by multiple stressors.  She continue Zoloft.  We did discuss counseling, however she has done this in the past and did not find it helpful.  We will continue to monitor.  Hypothyroid-clinically stable on Synthroid 75 mcg.  Obtain follow-up TSH.  History of obstructive sleep apnea-she previously had issues tolerating masks.  She wishes to retrial CPAP.  Will refer to sleep specialist.  Insomnia-currently stable with use of as needed Xanax and trazodone.  Will continue same urine drug screen will be performed today and her controlled substance contract is updated.  Colon cancer screening-she is due for colonoscopy.  Referral has been placed.  Trigeminal neuralgia-overall this is stable.  She feels that the carbamazepine xr does help her pain.  We will continue carbamazepine 100 mg  p.o. twice daily and obtain a carbamazepine level today.

## 2020-04-22 ENCOUNTER — Other Ambulatory Visit: Payer: Self-pay

## 2020-04-22 ENCOUNTER — Telehealth: Payer: Self-pay | Admitting: Family

## 2020-04-22 ENCOUNTER — Encounter: Payer: Self-pay | Admitting: Family

## 2020-04-22 DIAGNOSIS — E039 Hypothyroidism, unspecified: Secondary | ICD-10-CM

## 2020-04-22 LAB — COMPREHENSIVE METABOLIC PANEL
AG Ratio: 1.4 (calc) (ref 1.0–2.5)
ALT: 11 U/L (ref 6–29)
AST: 10 U/L (ref 10–35)
Albumin: 3.7 g/dL (ref 3.6–5.1)
Alkaline phosphatase (APISO): 62 U/L (ref 31–125)
BUN: 14 mg/dL (ref 7–25)
CO2: 25 mmol/L (ref 20–32)
Calcium: 9 mg/dL (ref 8.6–10.2)
Chloride: 105 mmol/L (ref 98–110)
Creat: 0.83 mg/dL (ref 0.50–1.10)
Globulin: 2.7 g/dL (calc) (ref 1.9–3.7)
Glucose, Bld: 88 mg/dL (ref 65–99)
Potassium: 4.5 mmol/L (ref 3.5–5.3)
Sodium: 138 mmol/L (ref 135–146)
Total Bilirubin: 0.4 mg/dL (ref 0.2–1.2)
Total Protein: 6.4 g/dL (ref 6.1–8.1)

## 2020-04-22 LAB — TSH: TSH: 6.99 mIU/L — ABNORMAL HIGH

## 2020-04-22 LAB — CARBAMAZEPINE LEVEL, TOTAL

## 2020-04-22 MED ORDER — LEVOTHYROXINE SODIUM 100 MCG PO TABS
100.0000 ug | ORAL_TABLET | Freq: Every day | ORAL | 0 refills | Status: DC
Start: 2020-04-22 — End: 2020-04-22

## 2020-04-22 MED ORDER — LEVOTHYROXINE SODIUM 100 MCG PO TABS: 100.0000 ug | ORAL_TABLET | Freq: Every day | ORAL | 0 refills | Status: DC

## 2020-04-22 NOTE — Telephone Encounter (Signed)
Patient advised of results and provider's Instructions. She verbalized understanding. She will call back for lab appointment

## 2020-04-22 NOTE — Telephone Encounter (Signed)
Please advise pt to that lab work shows we need to increase synthroid dose.  I will increase to 100 mcg daily. She should take on empty stomach in AM 30 minutes before other medicines/food. Repeat TSH in 6 weeks, dx hypothyroid.   [facility]

## 2020-04-23 ENCOUNTER — Encounter: Payer: Self-pay | Admitting: Obstetrics and Gynecology

## 2020-04-23 DIAGNOSIS — B372 Candidiasis of skin and nail: Secondary | ICD-10-CM

## 2020-04-23 LAB — DRUG MONITORING, PANEL 8 WITH CONFIRMATION, URINE
6 Acetylmorphine: NEGATIVE ng/mL (ref <10)
Alcohol Metabolites: NEGATIVE ng/mL
Alphahydroxyalprazolam: 127 ng/mL — ABNORMAL HIGH (ref <25)
Alphahydroxymidazolam: NEGATIVE ng/mL (ref <50)
Alphahydroxytriazolam: NEGATIVE ng/mL (ref <50)
Aminoclonazepam: NEGATIVE ng/mL (ref <25)
Amphetamines: NEGATIVE ng/mL (ref <500)
Benzodiazepines: POSITIVE ng/mL — AB (ref <100)
Buprenorphine, Urine: NEGATIVE ng/mL (ref <5)
Cocaine Metabolite: NEGATIVE ng/mL (ref <150)
Creatinine: 243.7 mg/dL
Hydroxyethylflurazepam: NEGATIVE ng/mL (ref <50)
Lorazepam: NEGATIVE ng/mL (ref <50)
MDMA: NEGATIVE ng/mL (ref <500)
Marijuana Metabolite: NEGATIVE ng/mL (ref <20)
Nordiazepam: NEGATIVE ng/mL (ref <50)
Opiates: NEGATIVE ng/mL (ref <100)
Oxazepam: NEGATIVE ng/mL (ref <50)
Oxidant: NEGATIVE ug/mL
Oxycodone: NEGATIVE ng/mL (ref <100)
Temazepam: NEGATIVE ng/mL (ref <50)
pH: 5.4 (ref 4.5–9.0)

## 2020-04-23 LAB — DM TEMPLATE

## 2020-04-23 NOTE — Addendum Note (Signed)
Addended by: Manuela Schwartz on: 04/23/2020 12:59 PM   Modules accepted: Orders

## 2020-04-24 ENCOUNTER — Other Ambulatory Visit: Payer: 59

## 2020-04-24 ENCOUNTER — Encounter: Payer: Self-pay | Admitting: Family Medicine

## 2020-04-25 ENCOUNTER — Telehealth: Payer: Self-pay | Admitting: Family

## 2020-04-27 ENCOUNTER — Other Ambulatory Visit: Payer: 59

## 2020-04-27 ENCOUNTER — Ambulatory Visit: Payer: Self-pay | Admitting: Diagnostic Neuroimaging

## 2020-04-27 DIAGNOSIS — I10 Essential (primary) hypertension: Secondary | ICD-10-CM

## 2020-04-27 DIAGNOSIS — E039 Hypothyroidism, unspecified: Secondary | ICD-10-CM

## 2020-04-27 DIAGNOSIS — G500 Trigeminal neuralgia: Secondary | ICD-10-CM

## 2020-04-27 DIAGNOSIS — F419 Anxiety disorder, unspecified: Secondary | ICD-10-CM

## 2020-04-27 MED ORDER — MICONAZOLE NITRATE POWD
Freq: Two times a day (BID) | Status: DC
Start: 2020-04-27 — End: 2020-05-01

## 2020-04-27 MED ORDER — ALPRAZOLAM 0.25 MG PO TABS: ORAL_TABLET | ORAL | 0 refills | Status: DC

## 2020-04-27 NOTE — Addendum Note (Signed)
Addended by: Jiles Prows on: 04/27/2020 11:11 AM   Modules accepted: Orders

## 2020-04-27 NOTE — Telephone Encounter (Signed)
Requesting: alprazolam 0.25mg  Contract: 07/22/2019 UDS: 04/21/2020 Last Visit: 04/21/2020 Next Visit: 10/30/2020 Last Refill: 03/21/2020 #30  And 0RF  Please Advise

## 2020-04-28 LAB — CARBAMAZEPINE LEVEL, TOTAL: Carbamazepine Lvl: 2.1 mg/L — ABNORMAL LOW (ref 4.0–12.0)

## 2020-04-29 ENCOUNTER — Other Ambulatory Visit: Payer: Self-pay | Admitting: Obstetrics and Gynecology

## 2020-04-29 DIAGNOSIS — N3281 Overactive bladder: Secondary | ICD-10-CM

## 2020-04-29 MED ORDER — OXYBUTYNIN CHLORIDE ER 5 MG PO TB24: 5.0000 mg | ORAL_TABLET | Freq: Every day | ORAL | 5 refills | Status: DC

## 2020-04-30 ENCOUNTER — Ambulatory Visit: Payer: 59 | Admitting: Family Medicine

## 2020-05-01 MED ORDER — MICONAZOLE NITRATE 2 % EX AERP: 1.0000 "application " | INHALATION_SPRAY | Freq: Every day | CUTANEOUS | 2 refills | Status: DC

## 2020-05-01 NOTE — Addendum Note (Signed)
Addended by: Jaquita Folds on: 05/01/2020 08:58 AM   Modules accepted: Orders

## 2020-05-04 ENCOUNTER — Encounter: Payer: Self-pay | Admitting: Physical Therapy

## 2020-05-04 ENCOUNTER — Other Ambulatory Visit: Payer: Self-pay

## 2020-05-04 ENCOUNTER — Ambulatory Visit: Payer: 59 | Attending: Obstetrics and Gynecology | Admitting: Physical Therapy

## 2020-05-04 DIAGNOSIS — M6281 Muscle weakness (generalized): Secondary | ICD-10-CM | POA: Insufficient documentation

## 2020-05-04 DIAGNOSIS — R293 Abnormal posture: Secondary | ICD-10-CM | POA: Insufficient documentation

## 2020-05-04 NOTE — Patient Instructions (Addendum)

## 2020-05-04 NOTE — Therapy (Addendum)
[facility] [address], [place], [place], [address] Phone: [phone]   Fax:  [phone]  Physical Therapy Evaluation  Patient Details  Name: Sharon Rivers MRN: [id number] Date of Birth: 05/27/1970 Referring Provider (PT): Jaquita Folds, MD   Encounter Date: 05/04/2020   PT End of Session - 05/04/20 1650     Visit Number 1    Date for PT Re-Evaluation 07/27/20    PT Start Time 1618    PT Stop Time 9735    PT Time Calculation (min) 35 min    Activity Tolerance Patient tolerated treatment well    Behavior During Therapy [facility] for tasks assessed/performed             Past Medical History:  Diagnosis Date   Anxiety    Back pain    Chest pain    a. 07/2015 Myoview: EF 71%, medium size, mild intensity, partially reversible septal defect with overlying breast attenuation -> likely artifact. No significant reversible ischemia.   Constipation    Depression    Edema    feet and legs   Frequent headaches    GERD (gastroesophageal reflux disease)    History of DVT (deep vein thrombosis) 2018   Hypertension    Hypothyroidism    Insulin resistance    Joint pain    Migraines    OSA (obstructive sleep apnea) 03/17/2016   Osteoarthritis    Panic anxiety syndrome    SOB (shortness of breath)    a. 04/2016 Echo: EF 60-65%, Gr1 DD.   Swallowing difficulty     Past Surgical History:  Procedure Laterality Date   CESAREAN SECTION  2001 & 2002   ELBOW SURGERY     MINOR CARPAL TUNNEL     pinched nerve in elbow   WISDOM TOOTH EXTRACTION      There were no vitals filed for this visit.    Subjective Assessment - 05/04/20 1622     Subjective Pt has been having leakage for years and has gotten worse lately.  Sometimes I have full incontinence when I cough or sneeze.  I use one big pads 1/day or hourly using panty liners.  I have back pain sometimes and goes down the left leg    Limitations Walking     Patient Stated Goals not have leakage    Currently in Pain? No/denies                [facility] PT Assessment - 05/04/20 0001       Assessment   Medical Diagnosis N32.81 (ICD-10-CM) - Overactive bladder; N39.3 (ICD-10-CM) - SUI (stress urinary incontinence, female)    Referring Provider (PT) Jaquita Folds, MD    Prior Therapy No      Precautions   Precautions None      Restrictions   Weight Bearing Restrictions No      Balance Screen   Has the patient fallen in the past 6 months No      [place] residence    Living Arrangements Spouse/significant other      Prior Function   Level of Independence Independent      Cognition   Overall Cognitive Status Within Functional Limits for tasks assessed      Functional Tests   Functional tests Single leg stance      Single Leg Stance   Comments trendelenburg bilateral      Posture/Postural Control   Posture/Postural Control  Postural limitations    Postural Limitations Rounded Shoulders;Increased thoracic kyphosis;Increased lumbar lordosis      ROM / Strength   AROM / PROM / Strength Strength;AROM      AROM   Overall AROM Comments lumbar flexion 50%      Strength   Overall Strength Comments hip adduction 4/5; core 4/5      Flexibility   Soft Tissue Assessment /Muscle Length yes    Hamstrings mild-R, mod-L  tight       Palpation   Palpation comment lumbar tight; lower abdominal incision      Ambulation/Gait   Gait Pattern Within Functional Limits                        Objective measurements completed on examination: See above findings.     Pelvic Floor Special Questions - 05/04/20 0001     Prior Pelvic/Prostate Exam Yes    Prior Pregnancies Yes    Number of Pregnancies 2    Number of C-Sections 2    Currently Sexually Active No    Urinary Leakage Yes    Pad use 1 big one /day    Activities that cause leaking Coughing;Sneezing;Walking    Urinary  urgency No    Urinary frequency every couple hours    Fecal incontinence No    Falling out feeling (prolapse) No    Skin Integrity Irritaion present at    Skin Integrity Irritation Present at labia minora - redness    Prolapse None    Pelvic Floor Internal Exam pt identity confirmed and consent given    Exam Type Vaginal    Palpation Rt more tension that Lt    Strength fair squeeze, definite lift    Strength # of reps 1   4 quick flicks   Strength # of seconds 4    Tone normal                        PT Short Term Goals - 05/04/20 1704       PT SHORT TERM GOAL #1   Title Independent with initial HEP    Time 4    Period Weeks    Status New    Target Date 06/01/20               PT Long Term Goals - 05/04/20 1635       PT LONG TERM GOAL #1   Title Independent with ongoing HEP    Time 12    Period Weeks    Status New    Target Date 07/27/20      PT LONG TERM GOAL #2   Title Pt will be wearing one panty liner a day and not feel excess moisture    Time 12    Period Weeks    Status New    Target Date 07/27/20      PT LONG TERM GOAL #3   Title B hip strength >/= 4+/5 for improved stability    Time 12    Period Weeks    Status New    Target Date 07/27/20      PT LONG TERM GOAL #4   Title Pt will demonstrate neutral spine and good body mechanics with transitions and simulated household chores    Time 12    Period Weeks    Status New    Target Date 07/27/20  Plan - 05/04/20 1656     Clinical Impression Statement Pt presents to clinic due to stress incontinence.  Pt has had for years now but has gotten worse.. Of note, she has had c-section x 2 and has fascial restrictions around the incision.  Pt has core weakness and hip adductor weakness 4/5.  Pt has pelvic floor weakness of 3/5 and holding 4 seconds.  Pt has posture abdnormalities as mentioned above of increased thoracic and lumbar curve.  Pt will benefit from  skilled PT to address above impairments and reduce leakage.    Personal Factors and Comorbidities Comorbidity 2    Comorbidities back pain, csection    Examination-Activity Limitations Toileting;Continence    Examination-Participation Restrictions Community Activity    Stability/Clinical Decision Making Stable/Uncomplicated    Clinical Decision Making Low    Rehab Potential Excellent    PT Frequency 1x / week    PT Duration 12 weeks    PT Treatment/Interventions ADLs/Self Care Home Management;Biofeedback;Cryotherapy;Electrical Stimulation;Therapeutic exercise;Therapeutic activities;Neuromuscular re-education;Patient/family education;Manual techniques;Dry needling;Passive range of motion;Taping    PT Next Visit Plan lumbar and thoracic ROM, kegel isolation, core and c-section scar mobs    PT Home Exercise Plan moisturizers    Consulted and Agree with Plan of Care Patient             Patient will benefit from skilled therapeutic intervention in order to improve the following deficits and impairments:  Pain,Impaired flexibility,Decreased strength,Decreased coordination  Visit Diagnosis: Muscle weakness (generalized)  Abnormal posture     Problem List Patient Active Problem List   Diagnosis Date Noted   Trigeminal neuralgia 04/21/2020   Synovitis of toe 04/09/2020   Essential hypertension 03/08/2017   Other specified hypothyroidism 03/08/2017   Class 3 obesity with serious comorbidity and body mass index (BMI) of 45.0 to 49.9 in adult 09/26/2016   Insulin resistance 06/13/2016   Vitamin D deficiency 04/12/2016   OSA (obstructive sleep apnea) 03/17/2016   Insomnia 02/23/2016   GERD (gastroesophageal reflux disease) 02/23/2016   Migraines 02/23/2016   Depression 02/23/2016   Hypertension 07/22/2015   Hypothyroid 07/22/2015   Family history of early CAD 07/22/2015   Bilateral carpal tunnel syndrome 04/30/2015   Headache 12/12/2013   Heartburn 12/12/2013   Morbid obesity  ([place]) 09/12/2013   Chondromalacia patellae 09/12/2013   Arthropathy 07/14/2012    Camillo Flaming Jackelynn Hosie, PT 05/04/2020, 5:12 PM  [facility] [address] [place], [place], [address] Phone: [phone]   Fax:  [phone]  Name: Sharon Rivers MRN: [id number] Date of Birth: November 12, 1970 PHYSICAL THERAPY DISCHARGE SUMMARY  Visits from Start of Care: 1  Current functional level related to goals / functional outcomes: Eval only, see above   Remaining deficits: See above   Education / Equipment: HEP   Patient agrees to discharge. Patient goals were not met. Patient is being discharged due to not returning  [facility], PT 08/14/20 9:13 AM

## 2020-05-12 ENCOUNTER — Encounter (HOSPITAL_BASED_OUTPATIENT_CLINIC_OR_DEPARTMENT_OTHER): Payer: Self-pay | Admitting: Emergency Medicine

## 2020-05-12 ENCOUNTER — Emergency Department (HOSPITAL_BASED_OUTPATIENT_CLINIC_OR_DEPARTMENT_OTHER): Payer: 59

## 2020-05-12 ENCOUNTER — Inpatient Hospital Stay (HOSPITAL_BASED_OUTPATIENT_CLINIC_OR_DEPARTMENT_OTHER)
Admission: EM | Admit: 2020-05-12 | Discharge: 2020-05-18 | DRG: 175 | Disposition: A | Discharge status: Home / Self Care | Payer: 59 | Attending: Internal Medicine | Admitting: Internal Medicine

## 2020-05-12 ENCOUNTER — Other Ambulatory Visit: Payer: Self-pay

## 2020-05-12 DIAGNOSIS — K219 Gastro-esophageal reflux disease without esophagitis: Secondary | ICD-10-CM | POA: Diagnosis not present

## 2020-05-12 DIAGNOSIS — J9601 Acute respiratory failure with hypoxia: Secondary | ICD-10-CM | POA: Diagnosis present

## 2020-05-12 DIAGNOSIS — Z8349 Family history of other endocrine, nutritional and metabolic diseases: Secondary | ICD-10-CM

## 2020-05-12 DIAGNOSIS — Z8261 Family history of arthritis: Secondary | ICD-10-CM | POA: Diagnosis not present

## 2020-05-12 DIAGNOSIS — F32A Depression, unspecified: Secondary | ICD-10-CM | POA: Diagnosis present

## 2020-05-12 DIAGNOSIS — Z8249 Family history of ischemic heart disease and other diseases of the circulatory system: Secondary | ICD-10-CM | POA: Diagnosis not present

## 2020-05-12 DIAGNOSIS — Z86718 Personal history of other venous thrombosis and embolism: Secondary | ICD-10-CM

## 2020-05-12 DIAGNOSIS — E039 Hypothyroidism, unspecified: Secondary | ICD-10-CM | POA: Diagnosis not present

## 2020-05-12 DIAGNOSIS — I82441 Acute embolism and thrombosis of right tibial vein: Secondary | ICD-10-CM | POA: Diagnosis present

## 2020-05-12 DIAGNOSIS — Z79899 Other long term (current) drug therapy: Secondary | ICD-10-CM

## 2020-05-12 DIAGNOSIS — F3289 Other specified depressive episodes: Secondary | ICD-10-CM

## 2020-05-12 DIAGNOSIS — E872 Acidosis: Secondary | ICD-10-CM | POA: Diagnosis present

## 2020-05-12 DIAGNOSIS — R0602 Shortness of breath: Secondary | ICD-10-CM

## 2020-05-12 DIAGNOSIS — F419 Anxiety disorder, unspecified: Secondary | ICD-10-CM | POA: Diagnosis present

## 2020-05-12 DIAGNOSIS — Z20822 Contact with and (suspected) exposure to covid-19: Secondary | ICD-10-CM | POA: Diagnosis present

## 2020-05-12 DIAGNOSIS — I10 Essential (primary) hypertension: Secondary | ICD-10-CM | POA: Diagnosis present

## 2020-05-12 DIAGNOSIS — R12 Heartburn: Secondary | ICD-10-CM | POA: Diagnosis present

## 2020-05-12 DIAGNOSIS — Z6841 Body Mass Index (BMI) 40.0 and over, adult: Secondary | ICD-10-CM | POA: Diagnosis not present

## 2020-05-12 DIAGNOSIS — I2609 Other pulmonary embolism with acute cor pulmonale: Secondary | ICD-10-CM

## 2020-05-12 DIAGNOSIS — G4733 Obstructive sleep apnea (adult) (pediatric): Secondary | ICD-10-CM | POA: Diagnosis present

## 2020-05-12 DIAGNOSIS — N920 Excessive and frequent menstruation with regular cycle: Secondary | ICD-10-CM | POA: Diagnosis present

## 2020-05-12 DIAGNOSIS — E871 Hypo-osmolality and hyponatremia: Secondary | ICD-10-CM | POA: Diagnosis present

## 2020-05-12 DIAGNOSIS — I82491 Acute embolism and thrombosis of other specified deep vein of right lower extremity: Secondary | ICD-10-CM | POA: Diagnosis not present

## 2020-05-12 DIAGNOSIS — E88819 Insulin resistance, unspecified: Secondary | ICD-10-CM | POA: Diagnosis present

## 2020-05-12 DIAGNOSIS — E559 Vitamin D deficiency, unspecified: Secondary | ICD-10-CM | POA: Diagnosis present

## 2020-05-12 DIAGNOSIS — E876 Hypokalemia: Secondary | ICD-10-CM | POA: Diagnosis not present

## 2020-05-12 DIAGNOSIS — E038 Other specified hypothyroidism: Secondary | ICD-10-CM | POA: Diagnosis present

## 2020-05-12 DIAGNOSIS — I82431 Acute embolism and thrombosis of right popliteal vein: Secondary | ICD-10-CM | POA: Diagnosis present

## 2020-05-12 DIAGNOSIS — Z820 Family history of epilepsy and other diseases of the nervous system: Secondary | ICD-10-CM

## 2020-05-12 DIAGNOSIS — G500 Trigeminal neuralgia: Secondary | ICD-10-CM | POA: Diagnosis present

## 2020-05-12 DIAGNOSIS — E8881 Metabolic syndrome: Secondary | ICD-10-CM | POA: Diagnosis not present

## 2020-05-12 DIAGNOSIS — Z713 Dietary counseling and surveillance: Secondary | ICD-10-CM

## 2020-05-12 DIAGNOSIS — I2602 Saddle embolus of pulmonary artery with acute cor pulmonale: Secondary | ICD-10-CM | POA: Diagnosis not present

## 2020-05-12 DIAGNOSIS — I2699 Other pulmonary embolism without acute cor pulmonale: Principal | ICD-10-CM | POA: Diagnosis present

## 2020-05-12 DIAGNOSIS — Z87891 Personal history of nicotine dependence: Secondary | ICD-10-CM

## 2020-05-12 DIAGNOSIS — Z882 Allergy status to sulfonamides status: Secondary | ICD-10-CM

## 2020-05-12 DIAGNOSIS — Z79890 Hormone replacement therapy (postmenopausal): Secondary | ICD-10-CM

## 2020-05-12 LAB — CBC WITH DIFFERENTIAL/PLATELET
Abs Immature Granulocytes: 0.06 10*3/uL (ref 0.00–0.07)
Basophils Absolute: 0.1 10*3/uL (ref 0.0–0.1)
Basophils Relative: 1 %
Eosinophils Absolute: 0.3 10*3/uL (ref 0.0–0.5)
Eosinophils Relative: 4 %
HCT: 38.8 % (ref 36.0–46.0)
Hemoglobin: 13.4 g/dL (ref 12.0–15.0)
Immature Granulocytes: 1 %
Lymphocytes Relative: 11 %
Lymphs Abs: 1 10*3/uL (ref 0.7–4.0)
MCH: 29.3 pg (ref 26.0–34.0)
MCHC: 34.5 g/dL (ref 30.0–36.0)
MCV: 84.9 fL (ref 80.0–100.0)
Monocytes Absolute: 0.7 10*3/uL (ref 0.1–1.0)
Monocytes Relative: 8 %
Neutro Abs: 6.5 10*3/uL (ref 1.7–7.7)
Neutrophils Relative %: 75 %
Platelets: 284 10*3/uL (ref 150–400)
RBC: 4.57 MIL/uL (ref 3.87–5.11)
RDW: 13.2 % (ref 11.5–15.5)
WBC: 8.6 10*3/uL (ref 4.0–10.5)
nRBC: 0 % (ref 0.0–0.2)

## 2020-05-12 LAB — COMPREHENSIVE METABOLIC PANEL
ALT: 14 U/L (ref 0–44)
AST: 13 U/L — ABNORMAL LOW (ref 15–41)
Albumin: 3.8 g/dL (ref 3.5–5.0)
Alkaline Phosphatase: 61 U/L (ref 38–126)
Anion gap: 8 (ref 5–15)
BUN: 14 mg/dL (ref 6–20)
CO2: 23 mmol/L (ref 22–32)
Calcium: 9.1 mg/dL (ref 8.9–10.3)
Chloride: 102 mmol/L (ref 98–111)
Creatinine, Ser: 0.83 mg/dL (ref 0.44–1.00)
GFR, Estimated: >60 mL/min (ref >60)
Glucose, Bld: 101 mg/dL — ABNORMAL HIGH (ref 70–99)
Potassium: 4.1 mmol/L (ref 3.5–5.1)
Sodium: 133 mmol/L — ABNORMAL LOW (ref 135–145)
Total Bilirubin: 0.3 mg/dL (ref 0.3–1.2)
Total Protein: 7.5 g/dL (ref 6.5–8.1)

## 2020-05-12 LAB — RESP PANEL BY RT-PCR (FLU A&B, COVID) ARPGX2
Influenza A by PCR: NEGATIVE
Influenza B by PCR: NEGATIVE
SARS Coronavirus 2 by RT PCR: NEGATIVE

## 2020-05-12 LAB — TROPONIN I (HIGH SENSITIVITY)
Troponin I (High Sensitivity): 9 ng/L (ref <18)
Troponin I (High Sensitivity): 14 ng/L (ref <18)

## 2020-05-12 LAB — LACTIC ACID, PLASMA
Lactic Acid, Venous: 0.7 mmol/L (ref 0.5–1.9)
Lactic Acid, Venous: 0.9 mmol/L (ref 0.5–1.9)

## 2020-05-12 LAB — PROTIME-INR
INR: 1 (ref 0.8–1.2)
Prothrombin Time: 13 seconds (ref 11.4–15.2)

## 2020-05-12 LAB — MRSA PCR SCREENING: MRSA by PCR: NEGATIVE

## 2020-05-12 LAB — HEPARIN LEVEL (UNFRACTIONATED): Heparin Unfractionated: 0.18 IU/mL — ABNORMAL LOW (ref 0.30–0.70)

## 2020-05-12 LAB — LIPASE, BLOOD: Lipase: 28 U/L (ref 11–51)

## 2020-05-12 LAB — BRAIN NATRIURETIC PEPTIDE: B Natriuretic Peptide: 7.5 pg/mL (ref 0.0–100.0)

## 2020-05-12 MED ORDER — MORPHINE SULFATE (PF) 2 MG/ML IV SOLN
1.0000 mg | INTRAVENOUS | Status: DC | PRN
  Administered 2020-05-12 – 2020-05-13 (×3): 1 mg via INTRAVENOUS
  Filled 2020-05-12 (×3): qty 1

## 2020-05-12 MED ORDER — ONDANSETRON HCL 4 MG/2ML IJ SOLN
4.0000 mg | Freq: Once | INTRAMUSCULAR | Status: AC
  Administered 2020-05-12: 4 mg via INTRAVENOUS
  Filled 2020-05-12: qty 2

## 2020-05-12 MED ORDER — ONDANSETRON HCL 4 MG/2ML IJ SOLN
4.0000 mg | Freq: Four times a day (QID) | INTRAMUSCULAR | Status: DC | PRN
  Administered 2020-05-13 – 2020-05-14 (×2): 4 mg via INTRAVENOUS
  Filled 2020-05-12 (×2): qty 2

## 2020-05-12 MED ORDER — CARBAMAZEPINE ER 100 MG PO TB12
100.0000 mg | ORAL_TABLET | Freq: Two times a day (BID) | ORAL | Status: DC
  Administered 2020-05-12 – 2020-05-18 (×12): 100 mg via ORAL
  Filled 2020-05-12 (×12): qty 1

## 2020-05-12 MED ORDER — MORPHINE SULFATE (PF) 4 MG/ML IV SOLN
4.0000 mg | Freq: Once | INTRAVENOUS | Status: AC
  Administered 2020-05-12: 4 mg via INTRAVENOUS
  Filled 2020-05-12: qty 1

## 2020-05-12 MED ORDER — SODIUM CHLORIDE 0.9 % IV SOLN: INTRAVENOUS | Status: DC

## 2020-05-12 MED ORDER — LEVALBUTEROL HCL 0.63 MG/3ML IN NEBU: 0.6300 mg | INHALATION_SOLUTION | Freq: Four times a day (QID) | RESPIRATORY_TRACT | Status: DC | PRN

## 2020-05-12 MED ORDER — HEPARIN (PORCINE) 25000 UT/250ML-% IV SOLN
1900.0000 [IU]/h | INTRAVENOUS | Status: DC
  Administered 2020-05-12: 1400 [IU]/h via INTRAVENOUS
  Administered 2020-05-13: 1650 [IU]/h via INTRAVENOUS
  Filled 2020-05-12 (×2): qty 250

## 2020-05-12 MED ORDER — SODIUM CHLORIDE 0.9% FLUSH: 3.0000 mL | INTRAVENOUS | Status: DC | PRN

## 2020-05-12 MED ORDER — CHLORHEXIDINE GLUCONATE CLOTH 2 % EX PADS
6.0000 | MEDICATED_PAD | Freq: Every day | CUTANEOUS | Status: DC
  Administered 2020-05-12 – 2020-05-15 (×4): 6 via TOPICAL

## 2020-05-12 MED ORDER — OXYCODONE HCL 5 MG PO TABS
5.0000 mg | ORAL_TABLET | ORAL | Status: DC | PRN
  Administered 2020-05-13: 5 mg via ORAL
  Filled 2020-05-12: qty 1

## 2020-05-12 MED ORDER — ACETAMINOPHEN 650 MG RE SUPP: 650.0000 mg | Freq: Four times a day (QID) | RECTAL | Status: DC | PRN

## 2020-05-12 MED ORDER — SORBITOL 70 % SOLN
30.0000 mL | Freq: Every day | Status: DC | PRN
  Filled 2020-05-12: qty 30

## 2020-05-12 MED ORDER — LEVOTHYROXINE SODIUM 100 MCG PO TABS
100.0000 ug | ORAL_TABLET | Freq: Every day | ORAL | Status: DC
  Administered 2020-05-13 – 2020-05-18 (×6): 100 ug via ORAL
  Filled 2020-05-12 (×6): qty 1

## 2020-05-12 MED ORDER — SENNOSIDES-DOCUSATE SODIUM 8.6-50 MG PO TABS: 1.0000 | ORAL_TABLET | Freq: Every evening | ORAL | Status: DC | PRN

## 2020-05-12 MED ORDER — SODIUM CHLORIDE 0.9 % IV SOLN: 250.0000 mL | INTRAVENOUS | Status: DC | PRN

## 2020-05-12 MED ORDER — TRAZODONE HCL 100 MG PO TABS
100.0000 mg | ORAL_TABLET | Freq: Every day | ORAL | Status: DC
  Administered 2020-05-12 – 2020-05-17 (×6): 100 mg via ORAL
  Filled 2020-05-12 (×3): qty 1
  Filled 2020-05-12: qty 2
  Filled 2020-05-12: qty 1
  Filled 2020-05-12: qty 2

## 2020-05-12 MED ORDER — HEPARIN BOLUS VIA INFUSION
5000.0000 [IU] | Freq: Once | INTRAVENOUS | Status: AC
  Administered 2020-05-12: 5000 [IU] via INTRAVENOUS

## 2020-05-12 MED ORDER — PANTOPRAZOLE SODIUM 40 MG PO TBEC
40.0000 mg | DELAYED_RELEASE_TABLET | Freq: Two times a day (BID) | ORAL | Status: DC
  Administered 2020-05-12 – 2020-05-18 (×12): 40 mg via ORAL
  Filled 2020-05-12 (×12): qty 1

## 2020-05-12 MED ORDER — MICONAZOLE NITRATE 2 % EX AERP: 1.0000 "application " | INHALATION_SPRAY | Freq: Every day | CUTANEOUS | Status: DC

## 2020-05-12 MED ORDER — AZELASTINE HCL 0.1 % NA SOLN
1.0000 | Freq: Two times a day (BID) | NASAL | Status: DC
  Administered 2020-05-12 – 2020-05-14 (×3): 1 via NASAL
  Filled 2020-05-12: qty 30

## 2020-05-12 MED ORDER — MONTELUKAST SODIUM 10 MG PO TABS
10.0000 mg | ORAL_TABLET | Freq: Every day | ORAL | Status: DC
  Administered 2020-05-12 – 2020-05-17 (×6): 10 mg via ORAL
  Filled 2020-05-12 (×6): qty 1

## 2020-05-12 MED ORDER — IOHEXOL 350 MG/ML SOLN
100.0000 mL | Freq: Once | INTRAVENOUS | Status: AC | PRN
  Administered 2020-05-12: 100 mL via INTRAVENOUS

## 2020-05-12 MED ORDER — ONDANSETRON HCL 4 MG PO TABS
4.0000 mg | ORAL_TABLET | Freq: Four times a day (QID) | ORAL | Status: DC | PRN
  Administered 2020-05-14: 4 mg via ORAL
  Filled 2020-05-12: qty 1

## 2020-05-12 MED ORDER — ALPRAZOLAM 0.25 MG PO TABS
0.2500 mg | ORAL_TABLET | Freq: Two times a day (BID) | ORAL | Status: DC | PRN
  Administered 2020-05-12: 0.25 mg via ORAL
  Filled 2020-05-12: qty 1

## 2020-05-12 MED ORDER — HYDROMORPHONE HCL 1 MG/ML IJ SOLN
0.5000 mg | Freq: Once | INTRAMUSCULAR | Status: AC
  Administered 2020-05-12: 0.5 mg via INTRAVENOUS
  Filled 2020-05-12: qty 1

## 2020-05-12 MED ORDER — SODIUM CHLORIDE 0.9% FLUSH
3.0000 mL | Freq: Two times a day (BID) | INTRAVENOUS | Status: DC
  Administered 2020-05-12 – 2020-05-15 (×2): 3 mL via INTRAVENOUS

## 2020-05-12 MED ORDER — MEGESTROL ACETATE 40 MG PO TABS
40.0000 mg | ORAL_TABLET | Freq: Two times a day (BID) | ORAL | Status: DC
  Administered 2020-05-12 – 2020-05-18 (×12): 40 mg via ORAL
  Filled 2020-05-12 (×12): qty 1

## 2020-05-12 MED ORDER — METOPROLOL TARTRATE 5 MG/5ML IV SOLN
5.0000 mg | Freq: Four times a day (QID) | INTRAVENOUS | Status: DC | PRN
  Administered 2020-05-13 – 2020-05-14 (×2): 5 mg via INTRAVENOUS
  Filled 2020-05-12 (×2): qty 5

## 2020-05-12 MED ORDER — BENZONATATE 100 MG PO CAPS: 100.0000 mg | ORAL_CAPSULE | Freq: Three times a day (TID) | ORAL | Status: DC | PRN

## 2020-05-12 MED ORDER — FLUTICASONE PROPIONATE 50 MCG/ACT NA SUSP
2.0000 | Freq: Every day | NASAL | Status: DC | PRN
  Filled 2020-05-12: qty 16

## 2020-05-12 MED ORDER — OXYBUTYNIN CHLORIDE ER 5 MG PO TB24
5.0000 mg | ORAL_TABLET | Freq: Every day | ORAL | Status: DC
  Administered 2020-05-12 – 2020-05-17 (×6): 5 mg via ORAL
  Filled 2020-05-12 (×6): qty 1

## 2020-05-12 MED ORDER — HEPARIN BOLUS VIA INFUSION
2500.0000 [IU] | Freq: Once | INTRAVENOUS | Status: AC
  Administered 2020-05-12: 2500 [IU] via INTRAVENOUS
  Filled 2020-05-12: qty 2500

## 2020-05-12 MED ORDER — SERTRALINE HCL 100 MG PO TABS
100.0000 mg | ORAL_TABLET | Freq: Every day | ORAL | Status: DC
  Administered 2020-05-12 – 2020-05-17 (×6): 100 mg via ORAL
  Filled 2020-05-12 (×6): qty 1

## 2020-05-12 MED ORDER — ACETAMINOPHEN 325 MG PO TABS: 650.0000 mg | ORAL_TABLET | Freq: Four times a day (QID) | ORAL | Status: DC | PRN

## 2020-05-12 MED ORDER — SODIUM CHLORIDE 0.9% FLUSH
3.0000 mL | Freq: Two times a day (BID) | INTRAVENOUS | Status: DC
  Administered 2020-05-12 – 2020-05-17 (×12): 3 mL via INTRAVENOUS

## 2020-05-12 NOTE — H&P (Signed)
History and Physical    Sharon Rivers ALP:[id number] DOB: [birth year] DOA: 05/12/2020  PCP: [name], NP  Patient coming from: Home  I have personally briefly reviewed patient's old medical records in [place]  Chief Complaint: Chest pain  HPI: Sharon Rivers is a 50 y.o. female with medical history significant of prior ankle DVT per patient was on anticoagulation currently off, history of menorrhagia chronically on Megace, hypertension, hypothyroidism, insulin resistance, nonpitting edema to lower extremities presented to the ED due to a 2-day history of right-sided chest pain.  Patient describes the chest pain had gotten worse on the morning of admission with associated right scapular pain constant in nature.  Patient does endorse some shortness of breath associated with this.  No radiation of the chest pain.  No significant worsening lower extremity edema palpation.  Patient denies any fevers, no chills, no emesis, no abdominal pain, no diarrhea, no constipation, no melena, no hematemesis, no hematochezia, no hemoptysis, no lightheadedness, no dizziness, no syncope, no asymmetric weakness or numbness.  Patient denies any recent surgeries, no long car rides or long travel.  Patient denies any oral contraceptive use. Patient subsequently presented to the [facility] ED.  ED Course: Patient seen in the ED, lactic acid level obtained was 0.7.  SARS coronavirus 2 PCR negative.  High-sensitivity troponin negative at 9 and 14.  BNP 7.5.  Comprehensive metabolic profile with a sodium of 133, glucose of 101 otherwise was within normal limits.  CBC unremarkable. CT angiogram chest done positive for acute PE with CT evidence of right heart strain consistent with early submassive PE.  Small right effusion and bilateral peripheral areas of groundglass and airspace consolidation compatible with pulmonary infarct. -CT abdomen and pelvis with no acute findings within the abdomen and  pelvis, gallstone. -Patient noted to the ED on presentation to be slightly tachypneic, tachycardic, blood pressure stable.  -Patient placed on IV heparin, hospitalist called to admit the patient for further evaluation and management.  Review of Systems: As per HPI otherwise all other systems reviewed and are negative.  Past Medical History:  Diagnosis Date  . Anxiety   . Back pain   . Chest pain    a. 07/2015 Myoview: EF 71%, medium size, mild intensity, partially reversible septal defect with overlying breast attenuation -> likely artifact. No significant reversible ischemia.  . Constipation   . Depression   . Edema    feet and legs  . Frequent headaches   . GERD (gastroesophageal reflux disease)   . History of DVT (deep vein thrombosis) 2018  . Hypertension   . Hypothyroidism   . Insulin resistance   . Joint pain   . Migraines   . OSA (obstructive sleep apnea) 03/17/2016  . Osteoarthritis   . Panic anxiety syndrome   . SOB (shortness of breath)    a. 04/2016 Echo: EF 60-65%, Gr1 DD.  [name] Swallowing difficulty     Past Surgical History:  Procedure Laterality Date  . CESAREAN SECTION  2001 & 2002  . ELBOW SURGERY    . MINOR CARPAL TUNNEL     pinched nerve in elbow  . WISDOM TOOTH EXTRACTION      Social History  reports that she has quit smoking. Her smoking use included cigarettes. She has never used smokeless tobacco. She reports that she does not drink alcohol and does not use drugs.  Allergies  Allergen Reactions  . Adhesive [Tape] Other (See Comments)    Skin Burn.  [name]  Biofreeze [Menthol (Topical Analgesic)] Other (See Comments)    Skin Burn.  . Sulfa Antibiotics Rash    Family History  Problem Relation Age of Onset  . Hypertension Mother        Living  . Arthritis Mother   . Thyroid disease Mother   . Heart disease Mother        MI at age 6  . Heart attack Mother   . Migraines Mother   . Depression Mother   . Obesity Mother   . Congestive Heart Failure  Father        ?CHF  . Hypertension Maternal Grandmother   . Parkinson's disease Maternal Grandmother   . Alzheimer's disease Maternal Grandmother   . Cancer Maternal Grandfather   . Hypertension Maternal Grandfather   . Hypertension Paternal Grandmother   . Hypertension Paternal Grandfather   . Gout Brother   . ADD / ADHD Son        x1  . Other Son        #2-Unknown  . Diabetes Neg Hx    Mother alive age 74 with a history of MI, hypertension, hypothyroidism. Father unknown.  Prior to Admission medications   Medication Sig Start Date End Date Taking? Authorizing Provider  ALPRAZolam (XANAX) 0.25 MG tablet TAKE 1 TABLET BY MOUTH TWICE DAILY AS NEEDED FOR ANXIETY AND FOR SLEEP. DO NOT TAKE WITH HYDROCODONE. 04/27/20   [name], NP  amLODipine (NORVASC) 5 MG tablet Take 1 tablet (5 mg total) by mouth daily. 04/27/20   [name], NP  azelastine (ASTELIN) 0.1 % nasal spray Place 1 spray into both nostrils 2 (two) times daily. Use in each nostril as directed 04/14/20   [name], NP  benzonatate (TESSALON) 100 MG capsule Take 1 capsule (100 mg total) by mouth 3 (three) times daily as needed. 04/17/20   [name], NP  carbamazepine (TEGRETOL XR) 100 MG 12 hr tablet Take 1 tablet by mouth twice daily 04/27/20   [name], NP  fluticasone (FLONASE) 50 MCG/ACT nasal spray Place 2 sprays into both nostrils daily. 10/21/19   Ann Held, DO  levothyroxine (SYNTHROID) 100 MCG tablet Take 1 tablet (100 mcg total) by mouth daily. 04/22/20   [name], NP  lisinopril (ZESTRIL) 10 MG tablet Take 1 tablet (10 mg total) by mouth daily. 04/27/20   [name], NP  megestrol (MEGACE) 40 MG tablet Take 1 tablet (40 mg total) by mouth 2 (two) times daily. Can increase to two tablets twice a day in the event of heavy bleeding 12/16/19   Lavonia Drafts, MD  methocarbamol (ROBAXIN) 500 MG tablet TAKE 1 TABLET BY MOUTH AT BEDTIME AS  NEEDED FOR MUSCLE SPASM 04/27/20   [name], NP  Miconazole Nitrate 2 % AERP Apply 1 application topically daily. Apply daily if rash is present 05/01/20   Jaquita Folds, MD  montelukast (SINGULAIR) 10 MG tablet Take 1 tablet (10 mg total) by mouth at bedtime. 04/27/20   [name], NP  oxybutynin (DITROPAN-XL) 5 MG 24 hr tablet Take 1 tablet (5 mg total) by mouth daily. 04/29/20   Jaquita Folds, MD  pantoprazole (PROTONIX) 40 MG tablet Take 1 tablet (40 mg total) by mouth 2 (two) times daily. 03/13/20   [name], NP  promethazine-dextromethorphan (PROMETHAZINE-DM) 6.25-15 MG/5ML syrup Take 5 mLs by mouth 4 (four) times daily as needed for cough. 04/21/20   [name], NP  sertraline (ZOLOFT) 100 MG tablet Take 1 tablet (100  mg total) by mouth daily. 04/27/20   [name], NP  traZODone (DESYREL) 100 MG tablet Take 1 tablet (100 mg total) by mouth at bedtime. 04/27/20   [name], NP    Physical Exam: Vitals:   05/12/20 1430 05/12/20 1500 05/12/20 1530 05/12/20 1700  BP: (!) 121/91 131/67 126/85   Pulse: 88 85 84   Resp: (!) 22 (!) 21 (!) 26   Temp:    98.1 F (36.7 C)  TempSrc:    Oral  SpO2: 95% 95% 95%   Weight:      Height:        Constitutional: NAD, calm, comfortable Vitals:   05/12/20 1430 05/12/20 1500 05/12/20 1530 05/12/20 1700  BP: (!) 121/91 131/67 126/85   Pulse: 88 85 84   Resp: (!) 22 (!) 21 (!) 26   Temp:    98.1 F (36.7 C)  TempSrc:    Oral  SpO2: 95% 95% 95%   Weight:      Height:       Eyes: PERRL, lids and conjunctivae normal ENMT: Mucous membranes are moist. Posterior pharynx clear of any exudate or lesions.Normal dentition.  Neck: normal, supple, no masses, no thyromegaly Respiratory: Decreased breath sounds in the bases.  Some scattered coarse breath sounds.  Poor air movement.  Speaking in full sentences.  No use of accessory muscles of respiration. Cardiovascular: Tachycardia.  No  murmurs rubs or gallops.  Distant heart sounds secondary to body habitus.  No pitting lower extremity edema.  Abdomen: Abdomen is obese, soft, nontender, nondistended, positive bowel sounds.  No rebound.  No guarding.  Musculoskeletal: no clubbing / cyanosis. No joint deformity upper and lower extremities. Good ROM, no contractures. Normal muscle tone.  Skin: no rashes, lesions, ulcers. No induration Neurologic: CN 2-12 grossly intact. Sensation intact, DTR normal. Strength 5/5 in all 4.  Psychiatric: Normal judgment and insight. Alert and oriented x 3. Normal mood.   Labs on Admission: I have personally reviewed following labs and imaging studies  CBC: Recent Labs  Lab 05/12/20 1044  WBC 8.6  NEUTROABS 6.5  HGB 13.4  HCT 38.8  MCV 84.9  PLT 948    Basic Metabolic Panel: Recent Labs  Lab 05/12/20 1044  NA 133*  K 4.1  CL 102  CO2 23  GLUCOSE 101*  BUN 14  CREATININE 0.83  CALCIUM 9.1    GFR: Estimated Creatinine Clearance: 105.9 mL/min (by C-G formula based on SCr of 0.83 mg/dL).  Liver Function Tests: Recent Labs  Lab 05/12/20 1044  AST 13*  ALT 14  ALKPHOS 61  BILITOT 0.3  PROT 7.5  ALBUMIN 3.8    Urine analysis:    Component Value Date/Time   COLORURINE YELLOW 09/06/2019 1641   APPEARANCEUR CLEAR 09/06/2019 1641   LABSPEC >1.030 (H) 09/06/2019 1641   PHURINE 6.0 09/06/2019 1641   GLUCOSEU NEGATIVE 09/06/2019 1641   GLUCOSEU NEGATIVE 08/18/2016 0819   HGBUR NEGATIVE 09/06/2019 1641   BILIRUBINUR neg 01/06/2020 [place] 09/06/2019 1641   PROTEINUR Negative 01/06/2020 1203   PROTEINUR NEGATIVE 09/06/2019 1641   UROBILINOGEN 0.2 01/06/2020 1203   UROBILINOGEN 0.2 08/18/2016 0819   NITRITE neg 01/06/2020 1203   NITRITE NEGATIVE 09/06/2019 1641   LEUKOCYTESUR Negative 01/06/2020 1203   LEUKOCYTESUR NEGATIVE 09/06/2019 1641    Radiological Exams on Admission: CT Angio Chest PE W/Cm &/Or Wo Cm  Result Date: 05/12/2020 CLINICAL  DATA:  PE suspected. High probability. Shortness of breath and right  chest pain. Abdominal pain. Acute. Nonlocalized. EXAM: CT ANGIOGRAPHY CHEST CT ABDOMEN AND PELVIS WITH CONTRAST TECHNIQUE: Multidetector CT imaging of the chest was performed using the standard protocol during bolus administration of intravenous contrast. Multiplanar CT image reconstructions and MIPs were obtained to evaluate the vascular anatomy. Multidetector CT imaging of the abdomen and pelvis was performed using the standard protocol during bolus administration of intravenous contrast. CONTRAST:  142mL OMNIPAQUE IOHEXOL 350 MG/ML SOLN COMPARISON:  07/10/2017. FINDINGS: CTA CHEST FINDINGS Cardiovascular: There are bilateral lobar and segmental pulmonary artery filling defects compatible with acute pulmonary embolus. The RV to LV ratio is equal to 1.0. Heart size is normal.  No pericardial effusion. Mediastinum/Nodes: No enlarged mediastinal, hilar, or axillary lymph nodes. Thyroid gland, trachea, and esophagus demonstrate no significant findings. Lungs/Pleura: Small right pleural effusion. Within the subpleural aspect of the posteromedial left lower lobe there is a focal area of airspace and ground-glass consolidation compatible with pulmonary infarct. A similar area of ground-glass and airspace consolidation is noted in the periphery of the anterior right upper lobe. No suspicious lung nodules. Musculoskeletal: Spondylosis identified within the thoracic spine. No acute or suspicious osseous findings. Review of the MIP images confirms the above findings. CT ABDOMEN and PELVIS FINDINGS Hepatobiliary: No focal liver abnormality is seen. Calcified gallstone measures 2.4 cm. No gallbladder wall thickening or signs of biliary ductal dilatation. Pancreas: Unremarkable. No pancreatic ductal dilatation or surrounding inflammatory changes. Spleen: Normal in size without focal abnormality. Adrenals/Urinary Tract: Normal adrenal glands. No kidney mass or  hydronephrosis. Urinary bladder is unremarkable. Stomach/Bowel: Stomach is within normal limits. Appendix appears normal. No evidence of bowel wall thickening, distention, or inflammatory changes. Vascular/Lymphatic: Normal appearance of the abdominal aorta. No abdominopelvic adenopathy. Reproductive: Uterus and bilateral adnexa are unremarkable. Other: No ascites or focal fluid collection. Musculoskeletal: No acute osseous abnormality. Degenerative disc disease noted within the lower lumbar spine. Review of the MIP images confirms the above findings. IMPRESSION: 1. Positive for acute PE with CTevidence of right heart strain (RV/LV Ratio = 1.0) consistent with at least submassive (intermediate risk) PE. The presence of right heart strain has been associated with an increased risk of morbidity and mortality. 2. Small right effusion and bilateral, peripheral areas of ground-glass and airspace consolidation compatible with pulmonary infarct. 3. No acute findings within the abdomen or pelvis. 4. Gallstone. 5. Critical Value/emergent results were called by telephone at the time of interpretation on 05/12/2020 at 12:33 pm to provider [facility] , who verbally acknowledged these results. Electronically Signed   By: Kerby Moors M.D.   On: 05/12/2020 12:33   CT Abdomen Pelvis W Contrast  Result Date: 05/12/2020 CLINICAL DATA:  PE suspected. High probability. Shortness of breath and right chest pain. Abdominal pain. Acute. Nonlocalized. EXAM: CT ANGIOGRAPHY CHEST CT ABDOMEN AND PELVIS WITH CONTRAST TECHNIQUE: Multidetector CT imaging of the chest was performed using the standard protocol during bolus administration of intravenous contrast. Multiplanar CT image reconstructions and MIPs were obtained to evaluate the vascular anatomy. Multidetector CT imaging of the abdomen and pelvis was performed using the standard protocol during bolus administration of intravenous contrast. CONTRAST:  170mL OMNIPAQUE IOHEXOL 350  MG/ML SOLN COMPARISON:  07/10/2017. FINDINGS: CTA CHEST FINDINGS Cardiovascular: There are bilateral lobar and segmental pulmonary artery filling defects compatible with acute pulmonary embolus. The RV to LV ratio is equal to 1.0. Heart size is normal.  No pericardial effusion. Mediastinum/Nodes: No enlarged mediastinal, hilar, or axillary lymph nodes. Thyroid gland, trachea, and esophagus demonstrate no  significant findings. Lungs/Pleura: Small right pleural effusion. Within the subpleural aspect of the posteromedial left lower lobe there is a focal area of airspace and ground-glass consolidation compatible with pulmonary infarct. A similar area of ground-glass and airspace consolidation is noted in the periphery of the anterior right upper lobe. No suspicious lung nodules. Musculoskeletal: Spondylosis identified within the thoracic spine. No acute or suspicious osseous findings. Review of the MIP images confirms the above findings. CT ABDOMEN and PELVIS FINDINGS Hepatobiliary: No focal liver abnormality is seen. Calcified gallstone measures 2.4 cm. No gallbladder wall thickening or signs of biliary ductal dilatation. Pancreas: Unremarkable. No pancreatic ductal dilatation or surrounding inflammatory changes. Spleen: Normal in size without focal abnormality. Adrenals/Urinary Tract: Normal adrenal glands. No kidney mass or hydronephrosis. Urinary bladder is unremarkable. Stomach/Bowel: Stomach is within normal limits. Appendix appears normal. No evidence of bowel wall thickening, distention, or inflammatory changes. Vascular/Lymphatic: Normal appearance of the abdominal aorta. No abdominopelvic adenopathy. Reproductive: Uterus and bilateral adnexa are unremarkable. Other: No ascites or focal fluid collection. Musculoskeletal: No acute osseous abnormality. Degenerative disc disease noted within the lower lumbar spine. Review of the MIP images confirms the above findings. IMPRESSION: 1. Positive for acute PE with  CTevidence of right heart strain (RV/LV Ratio = 1.0) consistent with at least submassive (intermediate risk) PE. The presence of right heart strain has been associated with an increased risk of morbidity and mortality. 2. Small right effusion and bilateral, peripheral areas of ground-glass and airspace consolidation compatible with pulmonary infarct. 3. No acute findings within the abdomen or pelvis. 4. Gallstone. 5. Critical Value/emergent results were called by telephone at the time of interpretation on 05/12/2020 at 12:33 pm to provider [facility] , who verbally acknowledged these results. Electronically Signed   By: Kerby Moors M.D.   On: 05/12/2020 12:33   DG Chest Portable 1 View  Result Date: 05/12/2020 CLINICAL DATA:  Right-sided chest pain and shortness of breath EXAM: PORTABLE CHEST 1 VIEW COMPARISON:  Prior chest x-ray 06/30/2017 FINDINGS: Stable cardiac and mediastinal contours. Chronic bronchitic changes and mild interstitial prominence appear unchanged compared to prior imaging from 2019. No focal airspace infiltrate, pleural effusion or pneumothorax. No acute osseous abnormality. IMPRESSION: Stable chest x-ray without evidence of acute cardiopulmonary process. Electronically Signed   By: Jacqulynn Cadet M.D.   On: 05/12/2020 11:31    EKG: Independently reviewed.  Sinus tachycardia heart rate 103, low voltage.  No ischemic changes noted.  Assessment/Plan Principal Problem:   Acute pulmonary embolism (HCC) Active Problems:   Morbid obesity ([place])   Hypertension   Hypothyroid   Family history of early CAD   GERD (gastroesophageal reflux disease)   Depression   OSA (obstructive sleep apnea)   Vitamin D deficiency   Insulin resistance   Heartburn   Essential hypertension   Other specified hypothyroidism   Trigeminal neuralgia   Pulmonary embolism and infarction (HCC)   #1 acute bilateral pulmonary embolism -??  Etiology.  Patient with no long car rides.  No recent  surgeries.  No oral contraceptive use.  Patient however on Megace secondary to menorrhagia. -Patient noted to have an unprovoked DVT several years ago to her ankle and stated was on anticoagulation however cannot quite remember when that was. -CT angio chest which was done concerning for submassive PE with concern for right ventricular strain. -Cardiac enzymes negative. -Check a 2D echo, lower extremity Dopplers. -Continue IV heparin and could likely transition to oral anticoagulation if patient continues to remain stable in  about 48 to 72 hours. -As this is patient's second event will likely need anticoagulation lifetime. -If patient's condition deteriorates will need to formally consult with PCCM for further evaluation and management.  2.  Hypertension -Hold antihypertensive medications for now secondary to problem #1.  Follow.  3.  Gastroesophageal reflux disease -PPI.  4.  Hypothyroidism -Continue home regimen Synthroid.  5.  Depression/anxiety -Continue home regimen Zoloft.  Xanax as needed.  6.  Trigeminal neuralgia -Continue home regimen Tegretol.  7.  Morbid obesity  DVT prophylaxis: Heparin Code Status:   Full Family Communication:  Updated patient.  No family at bedside. Disposition Plan:   Patient is from:  Home  Anticipated DC to:  Home  Anticipated DC date:  3 to 4 days  Anticipated DC barriers: Clinical improvement  Consults called:  None Admission status:  Admit to inpatient/stepdown unit  Severity of Illness:     Irine Seal MD [facility]  How to contact the [facility] Attending or Consulting provider [place] or covering provider during after hours [place], for this patient?   1. Check the care team in [facility] and look for a) attending/consulting [facility] provider listed and b) the [facility] team listed 2. Log into [email or web address] and use [facility]'s universal password to access. If you do not have the password, please contact the hospital operator. 3. Locate the  [facility] provider you are looking for under [facility] and page to a number that you can be directly reached. 4. If you still have difficulty reaching the provider, please page the [facility] (Director on Call) for the Hospitalists listed on amion for assistance.  05/12/2020, 7:23 PM

## 2020-05-12 NOTE — Progress Notes (Signed)
ANTICOAGULATION CONSULT NOTE - Follow Up Consult  Pharmacy Consult for heparin Indication: acute pulmonary embolus  Allergies  Allergen Reactions  . Adhesive [Tape] Other (See Comments)    Skin Burn.  [name] [Menthol (Topical Analgesic)] Other (See Comments)    Skin Burn.  . Sulfa Antibiotics Rash    Patient Measurements: Height: 5\' 2"  (157.5 cm) Weight: 127 kg (280 lb) IBW/kg (Calculated) : 50.1 Heparin Dosing Weight: 82 kg  Vital Signs: Temp: 98.5 F (36.9 C) (04/05 1014) Temp Source: Oral (04/05 1014) BP: 126/85 (04/05 1530) Pulse Rate: 84 (04/05 1530)  Labs: Recent Labs    05/12/20 1044 05/12/20 1240  HGB 13.4  --   HCT 38.8  --   PLT 284  --   LABPROT 13.0  --   INR 1.0  --   CREATININE 0.83  --   TROPONINIHS 9 14    Estimated Creatinine Clearance: 105.9 mL/min (by C-G formula based on SCr of 0.83 mg/dL).   Assessment: Patient is a 50 y.o F with hx DVT in 2018 presented to [facility] ED with c/o SOB and CP. Chest CT showed bilateral PE with evidence of right heart strain consistent with at least submassive PE. She's currently on heparin drip for VTE.  Today, 05/12/2020: - first heparin level is sub-therapeutic at 0.18 with rate running at 1400 units/hr - per pt's RN, no issues with IV line and no bleeding noted  Goal of Therapy:  Heparin level 0.3-0.7 units/ml Monitor platelets by anticoagulation protocol: Yes   Plan:  - heparin 2500 units IV x1, then drip to 1650 units/hr - check 6 hr heparin level - monitor for s/sx bleeding  Areliz Rothman P 05/12/2020,4:47 PM

## 2020-05-12 NOTE — ED Provider Notes (Signed)
[facility] EMERGENCY DEPARTMENT Provider Note   CSN: [id number] Arrival date & time: 05/12/20  1004    History Chief Complaint  Patient presents with  . Chest Pain  . Shortness of Breath    Sharon Rivers is a 50 y.o. female with past medical history significant for hypertension, hypothyroidism, insulin resistance, edema to lower extremities who presents for evaluation of right-sided pain x2 days.  Radiates into her right scapula.  Nonexertional in nature.  Pain is consistent.  Initially was dull and aching however now sharp and stabbing.  Does occasionally radiate into her right upper abdomen.  Pain is not worse with food intake.  She feels short of breath.  She is coughing which is nonproductive.  No hemoptysis.  No unilateral leg swelling, redness or warmth.  Does have remote history of unprovoked DVT many years ago.  Patient is not sure if she has had a clotting work-up.  Pain does not radiate to left arm, left jaw or back.  No diaphoresis.  No nausea or vomiting.  No fever, chills, paresthesias or weakness.  Denies additional aggravating or alleviating factors.  No recent surgery, immobilization, malignancy.  History obtained from patient and past medical records.  No interpreter used.  HPI     Past Medical History:  Diagnosis Date  . Anxiety   . Back pain   . Chest pain    a. 07/2015 Myoview: EF 71%, medium size, mild intensity, partially reversible septal defect with overlying breast attenuation -> likely artifact. No significant reversible ischemia.  . Constipation   . Depression   . Edema    feet and legs  . Frequent headaches   . GERD (gastroesophageal reflux disease)   . History of DVT (deep vein thrombosis) 2018  . Hypertension   . Hypothyroidism   . Insulin resistance   . Joint pain   . Migraines   . OSA (obstructive sleep apnea) 03/17/2016  . Osteoarthritis   . Panic anxiety syndrome   . SOB (shortness of breath)    a. 04/2016 Echo: EF 60-65%, Gr1 DD.   [name] Swallowing difficulty     Patient Active Problem List   Diagnosis Date Noted  . Acute pulmonary embolism ([place]) 05/12/2020  . Trigeminal neuralgia 04/21/2020  . Synovitis of toe 04/09/2020  . Essential hypertension 03/08/2017  . Other specified hypothyroidism 03/08/2017  . Class 3 obesity with serious comorbidity and body mass index (BMI) of 45.0 to 49.9 in adult 09/26/2016  . Insulin resistance 06/13/2016  . Vitamin D deficiency 04/12/2016  . OSA (obstructive sleep apnea) 03/17/2016  . Insomnia 02/23/2016  . GERD (gastroesophageal reflux disease) 02/23/2016  . Migraines 02/23/2016  . Depression 02/23/2016  . Hypertension 07/22/2015  . Hypothyroid 07/22/2015  . Family history of early CAD 07/22/2015  . Bilateral carpal tunnel syndrome 04/30/2015  . Headache 12/12/2013  . Heartburn 12/12/2013  . Morbid obesity ([place]) 09/12/2013  . Chondromalacia patellae 09/12/2013  . Arthropathy 07/14/2012    Past Surgical History:  Procedure Laterality Date  . CESAREAN SECTION  2001 & 2002  . ELBOW SURGERY    . MINOR CARPAL TUNNEL     pinched nerve in elbow  . WISDOM TOOTH EXTRACTION       OB History    Gravida  3   Para  2   Term  2   Preterm      AB  1   Living  2     SAB  0   IAB  Ectopic      Multiple      Live Births  2           Family History  Problem Relation Age of Onset  . Hypertension Mother        Living  . Arthritis Mother   . Thyroid disease Mother   . Heart disease Mother        MI at age 47  . Heart attack Mother   . Migraines Mother   . Depression Mother   . Obesity Mother   . Congestive Heart Failure Father        ?CHF  . Hypertension Maternal Grandmother   . Parkinson's disease Maternal Grandmother   . Alzheimer's disease Maternal Grandmother   . Cancer Maternal Grandfather   . Hypertension Maternal Grandfather   . Hypertension Paternal Grandmother   . Hypertension Paternal Grandfather   . Gout Brother   . ADD / ADHD  Son        x1  . Other Son        #2-Unknown  . Diabetes Neg Hx     Social History   Tobacco Use  . Smoking status: Former Smoker    Types: Cigarettes  . Smokeless tobacco: Never Used  . Tobacco comment: quit 1992  Substance Use Topics  . Alcohol use: No    Alcohol/week: 0.0 standard drinks  . Drug use: No    Home Medications Prior to Admission medications   Medication Sig Start Date End Date Taking? Authorizing Provider  ALPRAZolam (XANAX) 0.25 MG tablet TAKE 1 TABLET BY MOUTH TWICE DAILY AS NEEDED FOR ANXIETY AND FOR SLEEP. DO NOT TAKE WITH HYDROCODONE. 04/27/20   [name], NP  amLODipine (NORVASC) 5 MG tablet Take 1 tablet (5 mg total) by mouth daily. 04/27/20   [name], NP  azelastine (ASTELIN) 0.1 % nasal spray Place 1 spray into both nostrils 2 (two) times daily. Use in each nostril as directed 04/14/20   [name], NP  benzonatate (TESSALON) 100 MG capsule Take 1 capsule (100 mg total) by mouth 3 (three) times daily as needed. 04/17/20   [name], NP  carbamazepine (TEGRETOL XR) 100 MG 12 hr tablet Take 1 tablet by mouth twice daily 04/27/20   [name], NP  fluticasone (FLONASE) 50 MCG/ACT nasal spray Place 2 sprays into both nostrils daily. 10/21/19   Ann Held, DO  levothyroxine (SYNTHROID) 100 MCG tablet Take 1 tablet (100 mcg total) by mouth daily. 04/22/20   [name], NP  lisinopril (ZESTRIL) 10 MG tablet Take 1 tablet (10 mg total) by mouth daily. 04/27/20   [name], NP  megestrol (MEGACE) 40 MG tablet Take 1 tablet (40 mg total) by mouth 2 (two) times daily. Can increase to two tablets twice a day in the event of heavy bleeding 12/16/19   Lavonia Drafts, MD  methocarbamol (ROBAXIN) 500 MG tablet TAKE 1 TABLET BY MOUTH AT BEDTIME AS NEEDED FOR MUSCLE SPASM 04/27/20   [name], NP  Miconazole Nitrate 2 % AERP Apply 1 application topically daily. Apply daily if rash is  present 05/01/20   Jaquita Folds, MD  montelukast (SINGULAIR) 10 MG tablet Take 1 tablet (10 mg total) by mouth at bedtime. 04/27/20   [name], NP  oxybutynin (DITROPAN-XL) 5 MG 24 hr tablet Take 1 tablet (5 mg total) by mouth daily. 04/29/20   Jaquita Folds, MD  pantoprazole (PROTONIX) 40 MG tablet Take 1 tablet (40 mg  total) by mouth 2 (two) times daily. 03/13/20   [name], NP  promethazine-dextromethorphan (PROMETHAZINE-DM) 6.25-15 MG/5ML syrup Take 5 mLs by mouth 4 (four) times daily as needed for cough. 04/21/20   [name], NP  sertraline (ZOLOFT) 100 MG tablet Take 1 tablet (100 mg total) by mouth daily. 04/27/20   [name], NP  traZODone (DESYREL) 100 MG tablet Take 1 tablet (100 mg total) by mouth at bedtime. 04/27/20   [name], NP    Allergies    Adhesive [tape], Biofreeze [menthol (topical analgesic)], and Sulfa antibiotics  Review of Systems   Review of Systems  Constitutional: Negative.   HENT: Negative.   Respiratory: Positive for cough, chest tightness and shortness of breath.   Cardiovascular: Positive for chest pain. Negative for palpitations and leg swelling.  Gastrointestinal: Positive for abdominal pain (Intermittent RUQ pain, none currently). Negative for anal bleeding, blood in stool, constipation, diarrhea, nausea, rectal pain and vomiting.  Genitourinary: Negative.   Musculoskeletal: Negative.   Skin: Negative.   Neurological: Positive for weakness (Generalized) and light-headedness. Negative for dizziness, tremors, seizures, syncope, facial asymmetry, speech difficulty, numbness and headaches.  All other systems reviewed and are negative.   Physical Exam Updated Vital Signs BP 133/81   Pulse 87   Temp 98.5 F (36.9 C) (Oral)   Resp (!) 29   Ht 5\' 2"  (1.575 m)   Wt 127 kg   SpO2 96%   BMI 51.21 kg/m   Physical Exam Vitals and nursing note reviewed.  Constitutional:      General: She is  not in acute distress.    Appearance: She is well-developed. She is not ill-appearing, toxic-appearing or diaphoretic.  HENT:     Head: Normocephalic and atraumatic.  Eyes:     Pupils: Pupils are equal, round, and reactive to light.  Cardiovascular:     Rate and Rhythm: Tachycardia present.     Pulses:          Radial pulses are 2+ on the right side and 2+ on the left side.       Dorsalis pedis pulses are 2+ on the right side and 2+ on the left side.     Heart sounds: Normal heart sounds.  Pulmonary:     Effort: Tachypnea present. No accessory muscle usage or respiratory distress.     Comments: Tachypneic however not hypoxic.  Speaks in short sentences.  Lungs diminished bilaterally at bases.  No obvious wheeze, rhonchi or rales Chest:     Chest wall: Tenderness present.     Comments: Equal rise and fall to chest wall.  Does have diffuse tenderness to right upper chest into right upper quadrant Abdominal:     General: Bowel sounds are normal. There is no distension.     Palpations: Abdomen is soft.     Tenderness: There is no abdominal tenderness.     Comments: Soft, minimal tenderness at lower rib border.  Negative Murphy sign, McBurney point.  No midline pulsatile abdominal mass  Musculoskeletal:        General: Normal range of motion.     Cervical back: Normal range of motion.     Right lower leg: No tenderness. No edema.     Left lower leg: No tenderness. No edema.     Comments: Compartments soft.  No pitting edema to lower extremities.  No obvious DVT on exam.  Moves all 4 extremities without difficulty.  Skin:    General: Skin is warm and dry.  Capillary Refill: Capillary refill takes less than 2 seconds.     Comments: No edema, erythema or warmth.  No fluctuance or induration.  Neurological:     General: No focal deficit present.     Mental Status: She is alert and oriented to person, place, and time.     Comments: Cranial nerves II to XII grossly intact.     ED  Results / Procedures / Treatments   Labs (all labs ordered are listed, but only abnormal results are displayed) Labs Reviewed  COMPREHENSIVE METABOLIC PANEL - Abnormal; Notable for the following components:      Result Value   Sodium 133 (*)    Glucose, Bld 101 (*)    AST 13 (*)    All other components within normal limits  RESP PANEL BY RT-PCR (FLU A&B, COVID) ARPGX2  CBC WITH DIFFERENTIAL/PLATELET  LIPASE, BLOOD  BRAIN NATRIURETIC PEPTIDE  PROTIME-INR  HEPARIN LEVEL (UNFRACTIONATED)  LACTIC ACID, PLASMA  LACTIC ACID, PLASMA  TROPONIN I (HIGH SENSITIVITY)  TROPONIN I (HIGH SENSITIVITY)    EKG EKG Interpretation  Date/Time:  Tuesday May 12 2020 10:16:39 EDT Ventricular Rate:  103 PR Interval:  178 QRS Duration: 89 QT Interval:  338 QTC Calculation: 443 R Axis:   62 Text Interpretation: Sinus tachycardia Low voltage, precordial leads Confirmed by Fredia Sorrow [phone]) on 05/12/2020 10:27:25 AM   Radiology CT Angio Chest PE W/Cm &/Or Wo Cm  Result Date: 05/12/2020 CLINICAL DATA:  PE suspected. High probability. Shortness of breath and right chest pain. Abdominal pain. Acute. Nonlocalized. EXAM: CT ANGIOGRAPHY CHEST CT ABDOMEN AND PELVIS WITH CONTRAST TECHNIQUE: Multidetector CT imaging of the chest was performed using the standard protocol during bolus administration of intravenous contrast. Multiplanar CT image reconstructions and MIPs were obtained to evaluate the vascular anatomy. Multidetector CT imaging of the abdomen and pelvis was performed using the standard protocol during bolus administration of intravenous contrast. CONTRAST:  145mL OMNIPAQUE IOHEXOL 350 MG/ML SOLN COMPARISON:  07/10/2017. FINDINGS: CTA CHEST FINDINGS Cardiovascular: There are bilateral lobar and segmental pulmonary artery filling defects compatible with acute pulmonary embolus. The RV to LV ratio is equal to 1.0. Heart size is normal.  No pericardial effusion. Mediastinum/Nodes: No enlarged  mediastinal, hilar, or axillary lymph nodes. Thyroid gland, trachea, and esophagus demonstrate no significant findings. Lungs/Pleura: Small right pleural effusion. Within the subpleural aspect of the posteromedial left lower lobe there is a focal area of airspace and ground-glass consolidation compatible with pulmonary infarct. A similar area of ground-glass and airspace consolidation is noted in the periphery of the anterior right upper lobe. No suspicious lung nodules. Musculoskeletal: Spondylosis identified within the thoracic spine. No acute or suspicious osseous findings. Review of the MIP images confirms the above findings. CT ABDOMEN and PELVIS FINDINGS Hepatobiliary: No focal liver abnormality is seen. Calcified gallstone measures 2.4 cm. No gallbladder wall thickening or signs of biliary ductal dilatation. Pancreas: Unremarkable. No pancreatic ductal dilatation or surrounding inflammatory changes. Spleen: Normal in size without focal abnormality. Adrenals/Urinary Tract: Normal adrenal glands. No kidney mass or hydronephrosis. Urinary bladder is unremarkable. Stomach/Bowel: Stomach is within normal limits. Appendix appears normal. No evidence of bowel wall thickening, distention, or inflammatory changes. Vascular/Lymphatic: Normal appearance of the abdominal aorta. No abdominopelvic adenopathy. Reproductive: Uterus and bilateral adnexa are unremarkable. Other: No ascites or focal fluid collection. Musculoskeletal: No acute osseous abnormality. Degenerative disc disease noted within the lower lumbar spine. Review of the MIP images confirms the above findings. IMPRESSION: 1. Positive for acute PE  with CTevidence of right heart strain (RV/LV Ratio = 1.0) consistent with at least submassive (intermediate risk) PE. The presence of right heart strain has been associated with an increased risk of morbidity and mortality. 2. Small right effusion and bilateral, peripheral areas of ground-glass and airspace  consolidation compatible with pulmonary infarct. 3. No acute findings within the abdomen or pelvis. 4. Gallstone. 5. Critical Value/emergent results were called by telephone at the time of interpretation on 05/12/2020 at 12:33 pm to provider [facility] , who verbally acknowledged these results. Electronically Signed   By: Kerby Moors M.D.   On: 05/12/2020 12:33   CT Abdomen Pelvis W Contrast  Result Date: 05/12/2020 CLINICAL DATA:  PE suspected. High probability. Shortness of breath and right chest pain. Abdominal pain. Acute. Nonlocalized. EXAM: CT ANGIOGRAPHY CHEST CT ABDOMEN AND PELVIS WITH CONTRAST TECHNIQUE: Multidetector CT imaging of the chest was performed using the standard protocol during bolus administration of intravenous contrast. Multiplanar CT image reconstructions and MIPs were obtained to evaluate the vascular anatomy. Multidetector CT imaging of the abdomen and pelvis was performed using the standard protocol during bolus administration of intravenous contrast. CONTRAST:  137mL OMNIPAQUE IOHEXOL 350 MG/ML SOLN COMPARISON:  07/10/2017. FINDINGS: CTA CHEST FINDINGS Cardiovascular: There are bilateral lobar and segmental pulmonary artery filling defects compatible with acute pulmonary embolus. The RV to LV ratio is equal to 1.0. Heart size is normal.  No pericardial effusion. Mediastinum/Nodes: No enlarged mediastinal, hilar, or axillary lymph nodes. Thyroid gland, trachea, and esophagus demonstrate no significant findings. Lungs/Pleura: Small right pleural effusion. Within the subpleural aspect of the posteromedial left lower lobe there is a focal area of airspace and ground-glass consolidation compatible with pulmonary infarct. A similar area of ground-glass and airspace consolidation is noted in the periphery of the anterior right upper lobe. No suspicious lung nodules. Musculoskeletal: Spondylosis identified within the thoracic spine. No acute or suspicious osseous findings. Review of the  MIP images confirms the above findings. CT ABDOMEN and PELVIS FINDINGS Hepatobiliary: No focal liver abnormality is seen. Calcified gallstone measures 2.4 cm. No gallbladder wall thickening or signs of biliary ductal dilatation. Pancreas: Unremarkable. No pancreatic ductal dilatation or surrounding inflammatory changes. Spleen: Normal in size without focal abnormality. Adrenals/Urinary Tract: Normal adrenal glands. No kidney mass or hydronephrosis. Urinary bladder is unremarkable. Stomach/Bowel: Stomach is within normal limits. Appendix appears normal. No evidence of bowel wall thickening, distention, or inflammatory changes. Vascular/Lymphatic: Normal appearance of the abdominal aorta. No abdominopelvic adenopathy. Reproductive: Uterus and bilateral adnexa are unremarkable. Other: No ascites or focal fluid collection. Musculoskeletal: No acute osseous abnormality. Degenerative disc disease noted within the lower lumbar spine. Review of the MIP images confirms the above findings. IMPRESSION: 1. Positive for acute PE with CTevidence of right heart strain (RV/LV Ratio = 1.0) consistent with at least submassive (intermediate risk) PE. The presence of right heart strain has been associated with an increased risk of morbidity and mortality. 2. Small right effusion and bilateral, peripheral areas of ground-glass and airspace consolidation compatible with pulmonary infarct. 3. No acute findings within the abdomen or pelvis. 4. Gallstone. 5. Critical Value/emergent results were called by telephone at the time of interpretation on 05/12/2020 at 12:33 pm to provider [facility] , who verbally acknowledged these results. Electronically Signed   By: Kerby Moors M.D.   On: 05/12/2020 12:33   DG Chest Portable 1 View  Result Date: 05/12/2020 CLINICAL DATA:  Right-sided chest pain and shortness of breath EXAM: PORTABLE CHEST 1 VIEW COMPARISON:  Prior chest x-ray 06/30/2017 FINDINGS: Stable cardiac and mediastinal contours.  Chronic bronchitic changes and mild interstitial prominence appear unchanged compared to prior imaging from 2019. No focal airspace infiltrate, pleural effusion or pneumothorax. No acute osseous abnormality. IMPRESSION: Stable chest x-ray without evidence of acute cardiopulmonary process. Electronically Signed   By: Jacqulynn Cadet M.D.   On: 05/12/2020 11:31    Procedures .Critical Care Performed by: Nettie Elm, PA-C Authorized by: Nettie Elm, PA-C   Critical care provider statement:    Critical care time (minutes):  45   Critical care was necessary to treat or prevent imminent or life-threatening deterioration of the following conditions:  Cardiac failure, circulatory failure and respiratory failure   Critical care was time spent personally by me on the following activities:  Discussions with consultants, evaluation of patient's response to treatment, examination of patient, ordering and performing treatments and interventions, ordering and review of laboratory studies, ordering and review of radiographic studies, pulse oximetry, re-evaluation of patient's condition, obtaining history from patient or surrogate and review of old charts     Medications Ordered in ED Medications  heparin ADULT infusion 100 units/mL (25000 units/220mL) (1,400 Units/hr Intravenous New Bag/Given 05/12/20 1320)  morphine 4 MG/ML injection 4 mg (4 mg Intravenous Given 05/12/20 1115)  ondansetron (ZOFRAN) injection 4 mg (4 mg Intravenous Given 05/12/20 1113)  iohexol (OMNIPAQUE) 350 MG/ML injection 100 mL (100 mLs Intravenous Contrast Given 05/12/20 1145)  HYDROmorphone (DILAUDID) injection 0.5 mg (0.5 mg Intravenous Given 05/12/20 1311)  heparin bolus via infusion 5,000 Units (5,000 Units Intravenous Bolus from Bag 05/12/20 1314)    ED Course  I have reviewed the triage vital signs and the nursing notes.  Pertinent labs & imaging results that were available during my care of the patient were reviewed by  me and considered in my medical decision making (see chart for details).  50 year old here for evaluation of right-sided chest pain.  On arrival she is afebrile, nonseptic appearing.  No acute respiratory distress however does have some tachypnea.  Decreased lung sounds at bases.  No obvious rhonchi.  Symptoms are nonexertional nature.  Does not radiate to left arm, left jaw.  Chest pain does radiate into her right scapula.  Does have remote history of DVT, patient unsure if she has had a clotting work-up previously.  States prior DVT was unprovoked.  She is on estrogen supplementation currently.  No clinical evidence of DVT on exam.  She is tachypneic, mildly tachycardic.  Clinical concern for PE.  She does have some right upper abdominal pain however question if this is more due to her work of breathing.  She has negative Murphy sign.  Pain is not worse with food intake.  Low suspicion for acute cholecystitis, choledocholithiasis, cholangitis.  We will plan on labs, imaging and reassess  Labs and imaging personally reviewed and interpreted:  CBC without leukocytosis Lipase 28 INR 1.0 Trop 3>>01 BNP 7.5 Metabolic panel with mild hyponatremia 133, no additional electrolyte, renal or liver abnormality Covid pendingCOVID pending CTA with submassive PE with heart strain, Evidence of pulm infarcts CT A with gallstones however no infectious process EKG without ischemia DG chest without acute process  Patient reassessed.  Pain controlled.  Discussed labs and imaging.  She will need to be admitted as she is critically ill with submassive PE.  Heparin started in ED.  CONSULT with Dr. Georgann Housekeeper with PCCM. Stable for step down with Hospitalist however if they have any questions they may consult  them.  CONSULT with Dr. Marylyn Ishihara with [facility] who is agreeable to accept patient in transfer for admission.  The patient appears reasonably stabilized for admission considering the current resources, flow, and  capabilities available in the ED at this time, and I doubt any other [facility] requiring further screening and/or treatment in the ED prior to admission.  Patient discussed with attending, Dr. Rogene Houston who is agreeable with above treatment, plan and disposition.     MDM Rules/Calculators/A&P                          Sharon Rivers was evaluated in Emergency Department on 05/12/2020 for the symptoms described in the history of present illness. She was evaluated in the context of the global COVID-19 pandemic, which necessitated consideration that the patient might be at risk for infection with the SARS-CoV-2 virus that causes COVID-19. Institutional protocols and algorithms that pertain to the evaluation of patients at risk for COVID-19 are in a state of rapid change based on information released by regulatory bodies including the CDC and federal and state organizations. These policies and algorithms were followed during the patient's care in the ED. Final Clinical Impression(s) / ED Diagnoses Final diagnoses:  Pulmonary embolism and infarction [facility])    Rx / DC Orders ED Discharge Orders    None       Traeger Sultana A, PA-C 05/12/20 1333    Fredia Sorrow, MD 05/13/20 [phone]

## 2020-05-12 NOTE — Progress Notes (Signed)
Notified by EDP of need for admission d/t acute PE with right heart strain. [facility] accepts patient to SDU at [facility]. EDP is to remain responsible for orders/medical decisions while patient is holding at [facility]. Upon arrival to [facility], [facility] will assume care. Nursing staff will call flow manager/carelink to notify them of patient's arrival so that the proper [facility] member may receive the patient. Thank you.  Jonnie Finner, DO

## 2020-05-12 NOTE — Progress Notes (Signed)
ANTICOAGULATION CONSULT NOTE - Initial Consult  Pharmacy Consult for heparin Indication: pulmonary embolus  Allergies  Allergen Reactions  . Adhesive [Tape] Other (See Comments)    Skin Burn.  [name] [Menthol (Topical Analgesic)] Other (See Comments)    Skin Burn.  . Sulfa Antibiotics Rash    Patient Measurements: Height: 5\' 2"  (157.5 cm) Weight: 127 kg (280 lb) IBW/kg (Calculated) : 50.1 Heparin Dosing Weight: 81.9kg  Vital Signs: Temp: 98.5 F (36.9 C) (04/05 1014) Temp Source: Oral (04/05 1014) BP: 128/83 (04/05 1213) Pulse Rate: 92 (04/05 1213)  Labs: Recent Labs    05/12/20 1044  HGB 13.4  HCT 38.8  PLT 284  CREATININE 0.83  TROPONINIHS 9    Estimated Creatinine Clearance: 105.9 mL/min (by C-G formula based on SCr of 0.83 mg/dL).   Medical History: Past Medical History:  Diagnosis Date  . Anxiety   . Back pain   . Chest pain    a. 07/2015 Myoview: EF 71%, medium size, mild intensity, partially reversible septal defect with overlying breast attenuation -> likely artifact. No significant reversible ischemia.  . Constipation   . Depression   . Edema    feet and legs  . Frequent headaches   . GERD (gastroesophageal reflux disease)   . History of DVT (deep vein thrombosis) 2018  . Hypertension   . Hypothyroidism   . Insulin resistance   . Joint pain   . Migraines   . OSA (obstructive sleep apnea) 03/17/2016  . Osteoarthritis   . Panic anxiety syndrome   . SOB (shortness of breath)    a. 04/2016 Echo: EF 60-65%, Gr1 DD.  [name] Swallowing difficulty     Medications:  Infusions:  . heparin      Assessment: 35 yof presented to the ED with SOB and CP. Found to have a PE with right heart strain. To start IV heparin. Baseline CBC is WNL and she is not on anticoagulation PTA.   Goal of Therapy:  Heparin level 0.3-0.7 units/ml Monitor platelets by anticoagulation protocol: Yes   Plan:  Heparin bolus 5000 units IV x 1 Heparin gtt 1400  units/hr Check a 6 hr heparin level Daily heparin level and CBC  Asia Favata, Rande Lawman 05/12/2020,12:42 PM

## 2020-05-12 NOTE — ED Notes (Signed)
Assessed PT with complaint of SOB and chest pain. PT arrived to room in wheelchair. PT had moderate SOB with ambulation. Spo2 on RA 96%, HR 109, RR 23, BBS clear anterior- posterior, diminished in both bases. PT denies cough and fever. PT states she has pain in right of chest. RN at bedside.

## 2020-05-12 NOTE — ED Triage Notes (Signed)
Pt presents with c/o chest pain and shortness of breath for 2 days

## 2020-05-13 ENCOUNTER — Inpatient Hospital Stay (HOSPITAL_COMMUNITY): Payer: 59

## 2020-05-13 DIAGNOSIS — R12 Heartburn: Secondary | ICD-10-CM

## 2020-05-13 DIAGNOSIS — Z8249 Family history of ischemic heart disease and other diseases of the circulatory system: Secondary | ICD-10-CM

## 2020-05-13 DIAGNOSIS — I2602 Saddle embolus of pulmonary artery with acute cor pulmonale: Secondary | ICD-10-CM

## 2020-05-13 DIAGNOSIS — I2699 Other pulmonary embolism without acute cor pulmonale: Secondary | ICD-10-CM | POA: Diagnosis not present

## 2020-05-13 DIAGNOSIS — I82401 Acute embolism and thrombosis of unspecified deep veins of right lower extremity: Secondary | ICD-10-CM

## 2020-05-13 DIAGNOSIS — E8881 Metabolic syndrome: Secondary | ICD-10-CM

## 2020-05-13 LAB — COMPREHENSIVE METABOLIC PANEL
ALT: 12 U/L (ref 0–44)
AST: 13 U/L — ABNORMAL LOW (ref 15–41)
Albumin: 3.6 g/dL (ref 3.5–5.0)
Alkaline Phosphatase: 57 U/L (ref 38–126)
Anion gap: 8 (ref 5–15)
BUN: 15 mg/dL (ref 6–20)
CO2: 21 mmol/L — ABNORMAL LOW (ref 22–32)
Calcium: 8.6 mg/dL — ABNORMAL LOW (ref 8.9–10.3)
Chloride: 109 mmol/L (ref 98–111)
Creatinine, Ser: 0.84 mg/dL (ref 0.44–1.00)
GFR, Estimated: >60 mL/min (ref >60)
Glucose, Bld: 100 mg/dL — ABNORMAL HIGH (ref 70–99)
Potassium: 3.6 mmol/L (ref 3.5–5.1)
Sodium: 138 mmol/L (ref 135–145)
Total Bilirubin: 0.9 mg/dL (ref 0.3–1.2)
Total Protein: 6.7 g/dL (ref 6.5–8.1)

## 2020-05-13 LAB — CBC
HCT: 36.8 % (ref 36.0–46.0)
Hemoglobin: 12.2 g/dL (ref 12.0–15.0)
MCH: 29.3 pg (ref 26.0–34.0)
MCHC: 33.2 g/dL (ref 30.0–36.0)
MCV: 88.2 fL (ref 80.0–100.0)
Platelets: 233 10*3/uL (ref 150–400)
RBC: 4.17 MIL/uL (ref 3.87–5.11)
RDW: 13.2 % (ref 11.5–15.5)
WBC: 7.9 10*3/uL (ref 4.0–10.5)
nRBC: 0 % (ref 0.0–0.2)

## 2020-05-13 LAB — URINALYSIS, COMPLETE (UACMP) WITH MICROSCOPIC
Bacteria, UA: NONE SEEN
Bilirubin Urine: NEGATIVE
Glucose, UA: NEGATIVE mg/dL
Ketones, ur: NEGATIVE mg/dL
Leukocytes,Ua: NEGATIVE
Nitrite: NEGATIVE
Protein, ur: NEGATIVE mg/dL
Specific Gravity, Urine: 1.021 (ref 1.005–1.030)
pH: 6 (ref 5.0–8.0)

## 2020-05-13 LAB — GLUCOSE, CAPILLARY
Glucose-Capillary: 98 mg/dL (ref 70–99)
Glucose-Capillary: 128 mg/dL — ABNORMAL HIGH (ref 70–99)
Glucose-Capillary: 145 mg/dL — ABNORMAL HIGH (ref 70–99)

## 2020-05-13 LAB — ECHOCARDIOGRAM COMPLETE
Area-P 1/2: 3.5 cm2
Height: 62 in
S' Lateral: 2.8 cm
Weight: 4613.79 oz

## 2020-05-13 LAB — HEPARIN LEVEL (UNFRACTIONATED)
Heparin Unfractionated: 0.19 IU/mL — ABNORMAL LOW (ref 0.30–0.70)
Heparin Unfractionated: 0.18 IU/mL — ABNORMAL LOW (ref 0.30–0.70)
Heparin Unfractionated: 0.34 IU/mL (ref 0.30–0.70)

## 2020-05-13 LAB — HIV ANTIBODY (ROUTINE TESTING W REFLEX): HIV Screen 4th Generation wRfx: NONREACTIVE

## 2020-05-13 LAB — PHOSPHORUS: Phosphorus: 3.8 mg/dL (ref 2.5–4.6)

## 2020-05-13 LAB — MAGNESIUM: Magnesium: 2 mg/dL (ref 1.7–2.4)

## 2020-05-13 MED ORDER — HEPARIN BOLUS VIA INFUSION
2500.0000 [IU] | Freq: Once | INTRAVENOUS | Status: AC
  Administered 2020-05-13: 2500 [IU] via INTRAVENOUS
  Filled 2020-05-13: qty 2500

## 2020-05-13 MED ORDER — AMLODIPINE BESYLATE 5 MG PO TABS
5.0000 mg | ORAL_TABLET | Freq: Every day | ORAL | Status: DC
  Administered 2020-05-13 – 2020-05-18 (×6): 5 mg via ORAL
  Filled 2020-05-13 (×6): qty 1

## 2020-05-13 MED ORDER — PERFLUTREN LIPID MICROSPHERE
1.0000 mL | INTRAVENOUS | Status: AC | PRN
  Administered 2020-05-13: 2 mL via INTRAVENOUS

## 2020-05-13 MED ORDER — HEPARIN (PORCINE) 25000 UT/250ML-% IV SOLN
2250.0000 [IU]/h | INTRAVENOUS | Status: DC
  Administered 2020-05-13 (×2): 2100 [IU]/h via INTRAVENOUS
  Administered 2020-05-14: 2250 [IU]/h via INTRAVENOUS
  Filled 2020-05-13 (×3): qty 250

## 2020-05-13 MED ORDER — HYDRALAZINE HCL 20 MG/ML IJ SOLN
10.0000 mg | Freq: Four times a day (QID) | INTRAMUSCULAR | Status: DC | PRN
  Administered 2020-05-13 – 2020-05-14 (×5): 10 mg via INTRAVENOUS
  Filled 2020-05-13 (×6): qty 1

## 2020-05-13 NOTE — Progress Notes (Signed)
PROGRESS NOTE    Sharon Rivers  GYK:[id number] DOB: 1970/08/15 DOA: 05/12/2020 PCP: Sharon Alar, NP   Brief Narrative:  The patient is a 50 year old super morbidly obese Caucasian female with a past medical history significant for prior ankle DVT on anticoagulation with Xarelto as well as history of menorrhagia chronically on Megace, hypertension, hypothyroidism, insulin resistance, nonpitting edema in the lower extremities as well as other comorbidities who presented with a chief complaint of right-sided chest pain last few days.  Chest pain has gotten worse on the morning of admission was associated with right scapular pain and was constant in nature.  She also had some shortness of breath but noticed no significant worsening lower extremity edema palpation.  She presented med center [facility] and subsequently transferred to [facility] found to have an acute pulmonary embolus on CTA of the chest with CT evidence of right heart strain consistent with early submassive PE.  There is also small right effusion and known bilateral peripheral areas of groundglass and airspace consolidation compatible with pulmonary infarct.  CT of the abdomen pelvis showed no acute findings.  In the ED she was tachypneic, tachycardic with her blood pressure being stable and she was initiated on IV heparin.  Currently she is getting further work-up and treatment and lower extremity venous duplex does show right lower extremity DVT.  Echocardiogram is pending to be done still.  PT OT to evaluate once she is at least 24 hours of therapeutic heparin.  Assessment & Plan:   Principal Problem:   Acute pulmonary embolism (HCC) Active Problems:   Morbid obesity (Plain)   Hypertension   Hypothyroid   Family history of early CAD   GERD (gastroesophageal reflux disease)   Depression   OSA (obstructive sleep apnea)   Vitamin D deficiency   Insulin resistance   Heartburn   Essential hypertension   Other specified  hypothyroidism   Trigeminal neuralgia   Pulmonary embolism and infarction (HCC)  Acute Bilateral Pulmonary Embolism with Associated Right Leg DVT -??  Etiology.   -Patient with no long car rides.  No recent surgeries.  No oral contraceptive use.  Patient however on Megace secondary to menorrhagia. -Patient noted to have an unprovoked DVT several years ago to her ankle and stated was on anticoagulation however cannot quite remember when that was and was on Xarelto then . -CT angio chest which was done concerning for submassive PE with concern for right ventricular strain. -Cardiac enzymes negative with First Troponin I being 9 and repeat being 14. -SpO2: 90 % O2 Flow Rate (L/min): 2 L/min; Was not wearing Supplemental O2 via [name] -Check a 2D echo and pending; BNP was 7.5 -Lower extremity Dopplers showed "Findings consistent with acute deep vein thrombosis involving the right popliteal vein, and right posterior tibial veins."  -PESI Class at Least I due to Age; Will rule out Heart Failure  -Continue IV heparin and could likely transition to oral anticoagulation if patient continues to remain stable in about 48 to 72 hours and likely will change to Apixaban -As this is patient's second event will likely need anticoagulation lifetime. -If patient's condition deteriorates will need to formally consult with PCCM for further evaluation and management. -Will need an Ambulatory Home O2 Screen prior to D/C and PT/OT to evaluate at least after 24 hours of Therapeutic Heparin gtt  Hypertension -Hold antihypertensive medications for now secondary to Above -Continue to Monitor BP per Protocol -Last BP was 138/88  Gastroesophageal Reflux Disease -C/w PPI with  Pantoprazole 40 mg po BID  Hypothyroidism -Check TSH in the AM  -Continue home regimen Levothyroxine 100 mcg po Daily .  Depression/Anxiety -Continue home regimen Sertraline 100 po Daily.   -C/w Alprazolam 0.25 mg po BID as needed  Anxiety  Trigeminal Neuralgia -Continue home regimen of Carbamazepine 100 mg po BID.  Metabolic Acidosis -Mild. Patient's CO2 is 21, AG is 8, and Chloride is 109 -Currently getting NS at 100 mL/hr x 1 day -Continue to Monitor and Trend -Repeat CMP this AM  Super Morbid Obesity -Complicates overall prognosis and care -Estimated body mass index is 52.74 kg/m as calculated from the following:   Height as of this encounter: 5\' 2"  (1.575 m).   Weight as of this encounter: 130.8 kg. -Weight Loss and Dietary Counseling given   DVT prophylaxis: Anticoagulated with Heparin gtt Code Status: FULL CODE  Family Communication: No family present at bedside Disposition Plan: Pending further clinical Workup but anticipate D/C in the next 24-48 hours  Status is: Inpatient  Remains inpatient appropriate because:Unsafe d/c plan, IV treatments appropriate due to intensity of illness or inability to take PO and Inpatient level of care appropriate due to severity of illness   Dispo: The patient is from: Home              Anticipated d/c is to: Home              Patient currently is not medically stable to d/c.   Difficult to place patient No  Consultants:   None   Procedures:  LE Venous Duplex  Summary:  BILATERAL:  -No evidence of popliteal cyst, bilaterally.  RIGHT:  - Findings consistent with acute deep vein thrombosis involving the right  popliteal vein, and right posterior tibial veins.  - There is no evidence of superficial venous thrombosis.    LEFT:  - There is no evidence of deep vein thrombosis in the lower extremity.  - There is no evidence of superficial venous thrombosis.    ECHOCARDIOGRAM  Antimicrobials:  Anti-infectives (From admission, onward)   None        Subjective: Seen and examined at bedside was still having some chest pain and states that she cannot take a full deep breath in without pain.  No nausea or vomiting.  Denies any lightheadedness or  dizziness.  No other concerns or complaints at this time has not been out of bed.  Objective: Vitals:   05/13/20 0400 05/13/20 0500 05/13/20 0600 05/13/20 0617  BP: (!) 163/105 (!) 164/110 (!) 167/110   Pulse: 92 83 91 76  Resp: (!) 27 (!) 23 19 (!) 22  Temp: 97.9 F (36.6 C) 97.9 F (36.6 C) 97.9 F (36.6 C)   TempSrc: Oral Oral Oral   SpO2: 96% 94% 94% 93%  Weight:  130.8 kg    Height:        Intake/Output Summary (Last 24 hours) at 05/13/2020 0757 Last data filed at 05/13/2020 0700 Gross per 24 hour  Intake 1494.05 ml  Output 600 ml  Net 894.05 ml   Filed Weights   05/12/20 1013 05/13/20 0500  Weight: 127 kg 130.8 kg   Examination: Physical Exam:  Constitutional: WN/WD super morbidly obese Caucasian female currently in mild disterss and appears calm but mildly uncomfortable Eyes: Lids and conjunctivae normal, sclerae anicteric  ENMT: External Ears, Nose appear normal. Grossly normal hearing.  Neck: Appears normal, supple, no cervical masses, normal ROM, no appreciable thyromegaly; no JVD Respiratory: Diminished to auscultation bilaterally,  no wheezing, rales, rhonchi or crackles. Normal respiratory effort and patient is not tachypenic. No accessory muscle use.  Unlabored breathing Cardiovascular: RRR, no murmurs / rubs / gallops. S1 and S2 auscultated.  Has some edema in lower extremities worse on the right compared to left Abdomen: Soft, mildly-tender, distended secondary by habitus. Bowel sounds positive.  GU: Deferred. Musculoskeletal: No clubbing / cyanosis of digits/nails. No joint deformity upper and lower extremities.  Skin: No rashes, lesions, ulcers on limited skin evaluation. No induration; Warm and dry.  Neurologic: CN 2-12 grossly intact with no focal deficits. Romberg sign and cerebellar reflexes not assessed.  Psychiatric: Normal judgment and insight. Alert and oriented x 3.  Slightly anxious mood and appropriate affect.   Data Reviewed: I have personally  reviewed following labs and imaging studies  CBC: Recent Labs  Lab 05/12/20 1044 05/13/20 0236  WBC 8.6 7.9  NEUTROABS 6.5  --   HGB 13.4 12.2  HCT 38.8 36.8  MCV 84.9 88.2  PLT 284 782   Basic Metabolic Panel: Recent Labs  Lab 05/12/20 1044 05/13/20 0236  NA 133* 138  K 4.1 3.6  CL 102 109  CO2 23 21*  GLUCOSE 101* 100*  BUN 14 15  CREATININE 0.83 0.84  CALCIUM 9.1 8.6*  MG  --  2.0  PHOS  --  3.8   GFR: Estimated Creatinine Clearance: 106.5 mL/min (by C-G formula based on SCr of 0.84 mg/dL). Liver Function Tests: Recent Labs  Lab 05/12/20 1044 05/13/20 0236  AST 13* 13*  ALT 14 12  ALKPHOS 61 57  BILITOT 0.3 0.9  PROT 7.5 6.7  ALBUMIN 3.8 3.6   Recent Labs  Lab 05/12/20 1044  LIPASE 28   No results for input(s): AMMONIA in the last 168 hours. Coagulation Profile: Recent Labs  Lab 05/12/20 1044  INR 1.0   Cardiac Enzymes: No results for input(s): CKTOTAL, CKMB, CKMBINDEX, TROPONINI in the last 168 hours. BNP (last 3 results) No results for input(s): PROBNP in the last 8760 hours. HbA1C: No results for input(s): HGBA1C in the last 72 hours. CBG: Recent Labs  Lab 05/13/20 0745  GLUCAP 98   Lipid Profile: No results for input(s): CHOL, HDL, LDLCALC, TRIG, CHOLHDL, LDLDIRECT in the last 72 hours. Thyroid Function Tests: No results for input(s): TSH, T4TOTAL, FREET4, T3FREE, THYROIDAB in the last 72 hours. Anemia Panel: No results for input(s): VITAMINB12, FOLATE, FERRITIN, TIBC, IRON, RETICCTPCT in the last 72 hours. Sepsis Labs: Recent Labs  Lab 05/12/20 1309 05/12/20 1702  LATICACIDVEN 0.7 0.9    Recent Results (from the past 240 hour(s))  Resp Panel by RT-PCR (Flu A&B, Covid) Nasopharyngeal Swab     Status: None   Collection Time: 05/12/20 12:40 PM   Specimen: Nasopharyngeal Swab; Nasopharyngeal(NP) swabs in vial transport medium  Result Value Ref Range Status   SARS Coronavirus 2 by RT PCR NEGATIVE NEGATIVE Final    Comment:  (NOTE) SARS-CoV-2 target nucleic acids are NOT DETECTED.  The SARS-CoV-2 RNA is generally detectable in upper respiratory specimens during the acute phase of infection. The lowest concentration of SARS-CoV-2 viral copies this assay can detect is 138 copies/mL. A negative result does not preclude SARS-Cov-2 infection and should not be used as the sole basis for treatment or other patient management decisions. A negative result may occur with  improper specimen collection/handling, submission of specimen other than nasopharyngeal swab, presence of viral mutation(s) within the areas targeted by this assay, and inadequate number of viral copies(<138 copies/mL).  A negative result must be combined with clinical observations, patient history, and epidemiological information. The expected result is Negative.  Fact Sheet for Patients:  EntrepreneurPulse.com.au  Fact Sheet for Healthcare Providers:  IncredibleEmployment.be  This test is no t yet approved or cleared by the [place] FDA and  has been authorized for detection and/or diagnosis of SARS-CoV-2 by FDA under an Emergency Use Authorization (EUA). This EUA will remain  in effect (meaning this test can be used) for the duration of the COVID-19 declaration under Section 564(b)(1) of the Act, 21 U.S.C.section 360bbb-3(b)(1), unless the authorization is terminated  or revoked sooner.       Influenza A by PCR NEGATIVE NEGATIVE Final   Influenza B by PCR NEGATIVE NEGATIVE Final    Comment: (NOTE) The Xpert Xpress SARS-CoV-2/FLU/RSV plus assay is intended as an aid in the diagnosis of influenza from Nasopharyngeal swab specimens and should not be used as a sole basis for treatment. Nasal washings and aspirates are unacceptable for Xpert Xpress SARS-CoV-2/FLU/RSV testing.  Fact Sheet for Patients: EntrepreneurPulse.com.au  Fact Sheet for Healthcare  Providers: IncredibleEmployment.be  This test is not yet approved or cleared by the [place] FDA and has been authorized for detection and/or diagnosis of SARS-CoV-2 by FDA under an Emergency Use Authorization (EUA). This EUA will remain in effect (meaning this test can be used) for the duration of the COVID-19 declaration under Section 564(b)(1) of the Act, 21 U.S.C. section 360bbb-3(b)(1), unless the authorization is terminated or revoked.  Performed at [facility], [address] [place], [place] [address]   MRSA PCR Screening     Status: None   Collection Time: 05/12/20  4:38 PM   Specimen: Nasal Mucosa; Nasopharyngeal  Result Value Ref Range Status   MRSA by PCR NEGATIVE NEGATIVE Final    Comment:        The GeneXpert MRSA Assay (FDA approved for NASAL specimens only), is one component of a comprehensive MRSA colonization surveillance program. It is not intended to diagnose MRSA infection nor to guide or monitor treatment for MRSA infections. Performed at [facility], [place] [address] [place], [place] [address]     RN Pressure Injury Documentation:     Estimated body mass index is 52.74 kg/m as calculated from the following:   Height as of this encounter: 5\' 2"  (1.575 m).   Weight as of this encounter: 130.8 kg.  Malnutrition Type:   Malnutrition Characteristics:   Nutrition Interventions:    Radiology Studies: CT Angio Chest PE W/Cm &/Or Wo Cm  Result Date: 05/12/2020 CLINICAL DATA:  PE suspected. High probability. Shortness of breath and right chest pain. Abdominal pain. Acute. Nonlocalized. EXAM: CT ANGIOGRAPHY CHEST CT ABDOMEN AND PELVIS WITH CONTRAST TECHNIQUE: Multidetector CT imaging of the chest was performed using the standard protocol during bolus administration of intravenous contrast. Multiplanar CT image reconstructions and MIPs were obtained to evaluate the vascular anatomy. Multidetector CT  imaging of the abdomen and pelvis was performed using the standard protocol during bolus administration of intravenous contrast. CONTRAST:  132mL OMNIPAQUE IOHEXOL 350 MG/ML SOLN COMPARISON:  07/10/2017. FINDINGS: CTA CHEST FINDINGS Cardiovascular: There are bilateral lobar and segmental pulmonary artery filling defects compatible with acute pulmonary embolus. The RV to LV ratio is equal to 1.0. Heart size is normal.  No pericardial effusion. Mediastinum/Nodes: No enlarged mediastinal, hilar, or axillary lymph nodes. Thyroid gland, trachea, and esophagus demonstrate no significant findings. Lungs/Pleura: Small right pleural effusion. Within the subpleural aspect  of the posteromedial left lower lobe there is a focal area of airspace and ground-glass consolidation compatible with pulmonary infarct. A similar area of ground-glass and airspace consolidation is noted in the periphery of the anterior right upper lobe. No suspicious lung nodules. Musculoskeletal: Spondylosis identified within the thoracic spine. No acute or suspicious osseous findings. Review of the MIP images confirms the above findings. CT ABDOMEN and PELVIS FINDINGS Hepatobiliary: No focal liver abnormality is seen. Calcified gallstone measures 2.4 cm. No gallbladder wall thickening or signs of biliary ductal dilatation. Pancreas: Unremarkable. No pancreatic ductal dilatation or surrounding inflammatory changes. Spleen: Normal in size without focal abnormality. Adrenals/Urinary Tract: Normal adrenal glands. No kidney mass or hydronephrosis. Urinary bladder is unremarkable. Stomach/Bowel: Stomach is within normal limits. Appendix appears normal. No evidence of bowel wall thickening, distention, or inflammatory changes. Vascular/Lymphatic: Normal appearance of the abdominal aorta. No abdominopelvic adenopathy. Reproductive: Uterus and bilateral adnexa are unremarkable. Other: No ascites or focal fluid collection. Musculoskeletal: No acute osseous  abnormality. Degenerative disc disease noted within the lower lumbar spine. Review of the MIP images confirms the above findings. IMPRESSION: 1. Positive for acute PE with CTevidence of right heart strain (RV/LV Ratio = 1.0) consistent with at least submassive (intermediate risk) PE. The presence of right heart strain has been associated with an increased risk of morbidity and mortality. 2. Small right effusion and bilateral, peripheral areas of ground-glass and airspace consolidation compatible with pulmonary infarct. 3. No acute findings within the abdomen or pelvis. 4. Gallstone. 5. Critical Value/emergent results were called by telephone at the time of interpretation on 05/12/2020 at 12:33 pm to provider [facility] , who verbally acknowledged these results. Electronically Signed   By: Kerby Moors M.D.   On: 05/12/2020 12:33   CT Abdomen Pelvis W Contrast  Result Date: 05/12/2020 CLINICAL DATA:  PE suspected. High probability. Shortness of breath and right chest pain. Abdominal pain. Acute. Nonlocalized. EXAM: CT ANGIOGRAPHY CHEST CT ABDOMEN AND PELVIS WITH CONTRAST TECHNIQUE: Multidetector CT imaging of the chest was performed using the standard protocol during bolus administration of intravenous contrast. Multiplanar CT image reconstructions and MIPs were obtained to evaluate the vascular anatomy. Multidetector CT imaging of the abdomen and pelvis was performed using the standard protocol during bolus administration of intravenous contrast. CONTRAST:  151mL OMNIPAQUE IOHEXOL 350 MG/ML SOLN COMPARISON:  07/10/2017. FINDINGS: CTA CHEST FINDINGS Cardiovascular: There are bilateral lobar and segmental pulmonary artery filling defects compatible with acute pulmonary embolus. The RV to LV ratio is equal to 1.0. Heart size is normal.  No pericardial effusion. Mediastinum/Nodes: No enlarged mediastinal, hilar, or axillary lymph nodes. Thyroid gland, trachea, and esophagus demonstrate no significant findings.  Lungs/Pleura: Small right pleural effusion. Within the subpleural aspect of the posteromedial left lower lobe there is a focal area of airspace and ground-glass consolidation compatible with pulmonary infarct. A similar area of ground-glass and airspace consolidation is noted in the periphery of the anterior right upper lobe. No suspicious lung nodules. Musculoskeletal: Spondylosis identified within the thoracic spine. No acute or suspicious osseous findings. Review of the MIP images confirms the above findings. CT ABDOMEN and PELVIS FINDINGS Hepatobiliary: No focal liver abnormality is seen. Calcified gallstone measures 2.4 cm. No gallbladder wall thickening or signs of biliary ductal dilatation. Pancreas: Unremarkable. No pancreatic ductal dilatation or surrounding inflammatory changes. Spleen: Normal in size without focal abnormality. Adrenals/Urinary Tract: Normal adrenal glands. No kidney mass or hydronephrosis. Urinary bladder is unremarkable. Stomach/Bowel: Stomach is within normal limits. Appendix appears normal.  No evidence of bowel wall thickening, distention, or inflammatory changes. Vascular/Lymphatic: Normal appearance of the abdominal aorta. No abdominopelvic adenopathy. Reproductive: Uterus and bilateral adnexa are unremarkable. Other: No ascites or focal fluid collection. Musculoskeletal: No acute osseous abnormality. Degenerative disc disease noted within the lower lumbar spine. Review of the MIP images confirms the above findings. IMPRESSION: 1. Positive for acute PE with CTevidence of right heart strain (RV/LV Ratio = 1.0) consistent with at least submassive (intermediate risk) PE. The presence of right heart strain has been associated with an increased risk of morbidity and mortality. 2. Small right effusion and bilateral, peripheral areas of ground-glass and airspace consolidation compatible with pulmonary infarct. 3. No acute findings within the abdomen or pelvis. 4. Gallstone. 5. Critical  Value/emergent results were called by telephone at the time of interpretation on 05/12/2020 at 12:33 pm to provider [facility] , who verbally acknowledged these results. Electronically Signed   By: Kerby Moors M.D.   On: 05/12/2020 12:33   DG Chest Portable 1 View  Result Date: 05/12/2020 CLINICAL DATA:  Right-sided chest pain and shortness of breath EXAM: PORTABLE CHEST 1 VIEW COMPARISON:  Prior chest x-ray 06/30/2017 FINDINGS: Stable cardiac and mediastinal contours. Chronic bronchitic changes and mild interstitial prominence appear unchanged compared to prior imaging from 2019. No focal airspace infiltrate, pleural effusion or pneumothorax. No acute osseous abnormality. IMPRESSION: Stable chest x-ray without evidence of acute cardiopulmonary process. Electronically Signed   By: Jacqulynn Cadet M.D.   On: 05/12/2020 11:31   Scheduled Meds: . azelastine  1 spray Each Nare BID  . carbamazepine  100 mg Oral BID  . Chlorhexidine Gluconate Cloth  6 each Topical Daily  . levothyroxine  100 mcg Oral Daily  . megestrol  40 mg Oral BID  . montelukast  10 mg Oral QHS  . oxybutynin  5 mg Oral Daily  . pantoprazole  40 mg Oral BID  . sertraline  100 mg Oral Daily  . sodium chloride flush  3 mL Intravenous Q12H  . sodium chloride flush  3 mL Intravenous Q12H  . traZODone  100 mg Oral QHS   Continuous Infusions: . sodium chloride    . sodium chloride 100 mL/hr at 05/13/20 0518  . heparin 1,900 Units/hr (05/13/20 0345)    LOS: 1 day   Kerney Elbe, DO [facility] PAGER is on AMION  If 7PM-7AM, please contact night-coverage [email or web address]

## 2020-05-13 NOTE — Progress Notes (Signed)
Pt BP noted to still be elevated this AM. 173/119. Pt asymptomatic at this time. No signs of distress. Pain 3/10. MD [facility] notified and aware.  Fluids d/c 'd. Awaiting orders.

## 2020-05-13 NOTE — Progress Notes (Signed)
  Echocardiogram 2D Echocardiogram has been performed.  Randa Lynn Bryley Kovacevic 05/13/2020, 12:29 PM

## 2020-05-13 NOTE — Progress Notes (Signed)
ANTICOAGULATION CONSULT NOTE - Follow Up Consult  Pharmacy Consult for heparin Indication: acute pulmonary embolus  Allergies  Allergen Reactions  . Adhesive [Tape] Other (See Comments)    Skin Burn.  [name] [Menthol (Topical Analgesic)] Other (See Comments)    Skin Burn.  . Sulfa Antibiotics Rash    Patient Measurements: Height: 5\' 2"  (157.5 cm) Weight: 127 kg (280 lb) IBW/kg (Calculated) : 50.1 Heparin Dosing Weight: 82 kg  Vital Signs: Temp: 97.8 F (36.6 C) (04/06 0200) Temp Source: Oral (04/06 0200) BP: 160/98 (04/06 0200) Pulse Rate: 88 (04/06 0200)  Labs: Recent Labs    05/12/20 1044 05/12/20 1240 05/12/20 1853 05/13/20 0236  HGB 13.4  --   --  12.2  HCT 38.8  --   --  36.8  PLT 284  --   --  233  LABPROT 13.0  --   --   --   INR 1.0  --   --   --   HEPARINUNFRC  --   --  0.18* 0.19*  CREATININE 0.83  --   --  0.84  TROPONINIHS 9 14  --   --     Estimated Creatinine Clearance: 104.6 mL/min (by C-G formula based on SCr of 0.84 mg/dL).   Assessment: Patient is a 50 y.o F with hx DVT in 2018 presented to [facility] ED with c/o SOB and CP. Chest CT showed bilateral PE with evidence of right heart strain consistent with at least submassive PE. She's currently on heparin drip for VTE.  Today, 05/13/2020: -  heparin level is sub-therapeutic at 0.19 with rate running at 1650 units/hr - CBC WNL - per pt's RN, no issues with IV line and no bleeding noted  Goal of Therapy:  Heparin level 0.3-0.7 units/ml Monitor platelets by anticoagulation protocol: Yes   Plan:  - heparin 2500 units IV x1, then increase drip to 1900 units/hr - check 6 hr heparin level - monitor for s/sx bleeding  Dolly Rias RPh 05/13/2020, 3:29 AM

## 2020-05-13 NOTE — Progress Notes (Signed)
ANTICOAGULATION CONSULT NOTE - Follow Up Consult  Pharmacy Consult for heparin Indication: acute pulmonary embolus  Allergies  Allergen Reactions  . Adhesive [Tape] Other (See Comments)    Skin Burn.  [name] [Menthol (Topical Analgesic)] Other (See Comments)    Skin Burn.  . Sulfa Antibiotics Rash   Patient Measurements: Height: 5\' 2"  (157.5 cm) Weight: 130.8 kg (288 lb 5.8 oz) IBW/kg (Calculated) : 50.1 Heparin Dosing Weight: 82 kg  Vital Signs: Temp: 98.5 F (36.9 C) (04/06 1600) Temp Source: Oral (04/06 1600) BP: 174/107 (04/06 1600) Pulse Rate: 92 (04/06 1600)  Labs: Recent Labs    05/12/20 1044 05/12/20 1240 05/12/20 1853 05/13/20 0236 05/13/20 1104 05/13/20 1825  HGB 13.4  --   --  12.2  --   --   HCT 38.8  --   --  36.8  --   --   PLT 284  --   --  233  --   --   LABPROT 13.0  --   --   --   --   --   INR 1.0  --   --   --   --   --   HEPARINUNFRC  --   --    < > 0.19* 0.18* 0.34  CREATININE 0.83  --   --  0.84  --   --   TROPONINIHS 9 14  --   --   --   --    < > = values in this interval not displayed.   Estimated Creatinine Clearance: 106.5 mL/min (by C-G formula based on SCr of 0.84 mg/dL).  Assessment: Patient is a 50 y.o F with hx DVT in 2018 presented to [facility] ED with c/o SOB and CP. Chest CT showed bilateral PE with evidence of right heart strain consistent with at least submassive PE. She's currently on heparin drip for VTE.  Today, 05/13/2020: - am heparin level remains sub-therapeutic at 0.18 after 2500 units bolus and rate increase to 19000 units/hr, repeated 2500 unit bolus & incr to 2100 units/hr - CBC WNL - per pt's RN, no issues with IV line and no bleeding noted - Dopplers + for acute RLE DVT - 1825 Heparin level in therapeutic range - 0.34 units/ml  Goal of Therapy:  Heparin level 0.3-0.7 units/ml Monitor platelets by anticoagulation protocol: Yes   Plan:  Continue Heparin infusion at 2100 units/hr - check 6 hr heparin  level - monitor for s/sx bleeding - plan to transition to Eliquis once stable - ? Stop megace  Minda Ditto PharmD 05/13/2020, 7:49 PM

## 2020-05-13 NOTE — CV Procedure (Signed)
BLE venous duplex completed. Preliminary findings given to Dr. Alfredia Ferguson at [phone]  Results can be found under chart review under CV PROC. 05/13/2020 11:13 AM Synethia Endicott RVT, RDMS

## 2020-05-13 NOTE — Progress Notes (Signed)
PT Cancellation Note  Patient Details Name: Sharon Rivers MRN: [id number] DOB: 06/18/1970   Cancelled Treatment:    Reason Eval/Treat Not Completed: Medical issues which prohibited therapy. BP elevated   [facility] 05/13/2020, 9:35 AM

## 2020-05-13 NOTE — Progress Notes (Signed)
ANTICOAGULATION CONSULT NOTE - Follow Up Consult  Pharmacy Consult for heparin Indication: acute pulmonary embolus  Allergies  Allergen Reactions  . Adhesive [Tape] Other (See Comments)    Skin Burn.  [name] [Menthol (Topical Analgesic)] Other (See Comments)    Skin Burn.  . Sulfa Antibiotics Rash    Patient Measurements: Height: 5\' 2"  (157.5 cm) Weight: 130.8 kg (288 lb 5.8 oz) IBW/kg (Calculated) : 50.1 Heparin Dosing Weight: 82 kg  Vital Signs: Temp: 98.8 F (37.1 C) (04/06 0800) Temp Source: Oral (04/06 0800) BP: 138/88 (04/06 0939) Pulse Rate: 87 (04/06 0800)  Labs: Recent Labs    05/12/20 1044 05/12/20 1240 05/12/20 1853 05/13/20 0236 05/13/20 1104  HGB 13.4  --   --  12.2  --   HCT 38.8  --   --  36.8  --   PLT 284  --   --  233  --   LABPROT 13.0  --   --   --   --   INR 1.0  --   --   --   --   HEPARINUNFRC  --   --  0.18* 0.19* 0.18*  CREATININE 0.83  --   --  0.84  --   TROPONINIHS 9 14  --   --   --     Estimated Creatinine Clearance: 106.5 mL/min (by C-G formula based on SCr of 0.84 mg/dL).   Assessment: Patient is a 50 y.o F with hx DVT in 2018 presented to [facility] ED with c/o SOB and CP. Chest CT showed bilateral PE with evidence of right heart strain consistent with at least submassive PE. She's currently on heparin drip for VTE.  Today, 05/13/2020: -  heparin level remains sub-therapeutic at 0.18 after 2500 units bolus and rate increase to 19000 units/hr - CBC WNL - per pt's RN, no issues with IV line and no bleeding noted - Dopplers + for acute RLE DVT  Goal of Therapy:  Heparin level 0.3-0.7 units/ml Monitor platelets by anticoagulation protocol: Yes   Plan:  - repeat heparin 2500 units IV x1, then increase drip to 2100 units/hr - check 6 hr heparin level - monitor for s/sx bleeding - plan to transition to Eliquis once stable - ? Stop megace  Eudelia Bunch, Pharm.D 05/13/2020 12:08 PM

## 2020-05-13 NOTE — Progress Notes (Signed)
OT Cancellation Note  Patient Details Name: Dimond Crotty MRN: [id number] DOB: Mar 06, 1970   Cancelled Treatment:    Reason Eval/Treat Not Completed: Patient not medically ready. Heparin initiated 05/12/20 at 1314. Per protocol OT evaluation held for 24 hrs and await for heparin level to fall into therapeutic range. Will follow acutely and evaluate when appropriate.    Joseguadalupe Stan L Vinita Prentiss 05/13/2020, 6:46 AM

## 2020-05-13 NOTE — Progress Notes (Signed)
Patient oxygen sat 94% on RA with ambulation Patient oxygen 99% on 2L nasal cannula with ambulation. Pt tolerated well.

## 2020-05-14 ENCOUNTER — Inpatient Hospital Stay (HOSPITAL_COMMUNITY): Payer: 59

## 2020-05-14 DIAGNOSIS — R112 Nausea with vomiting, unspecified: Secondary | ICD-10-CM

## 2020-05-14 LAB — COMPREHENSIVE METABOLIC PANEL
ALT: 14 U/L (ref 0–44)
AST: 13 U/L — ABNORMAL LOW (ref 15–41)
Albumin: 3.5 g/dL (ref 3.5–5.0)
Alkaline Phosphatase: 55 U/L (ref 38–126)
Anion gap: 9 (ref 5–15)
BUN: 9 mg/dL (ref 6–20)
CO2: 20 mmol/L — ABNORMAL LOW (ref 22–32)
Calcium: 8.5 mg/dL — ABNORMAL LOW (ref 8.9–10.3)
Chloride: 103 mmol/L (ref 98–111)
Creatinine, Ser: 0.63 mg/dL (ref 0.44–1.00)
GFR, Estimated: >60 mL/min (ref >60)
Glucose, Bld: 119 mg/dL — ABNORMAL HIGH (ref 70–99)
Potassium: 3.6 mmol/L (ref 3.5–5.1)
Sodium: 132 mmol/L — ABNORMAL LOW (ref 135–145)
Total Bilirubin: 0.8 mg/dL (ref 0.3–1.2)
Total Protein: 6.9 g/dL (ref 6.5–8.1)

## 2020-05-14 LAB — CBC WITH DIFFERENTIAL/PLATELET
Abs Immature Granulocytes: 0.09 10*3/uL — ABNORMAL HIGH (ref 0.00–0.07)
Basophils Absolute: 0 10*3/uL (ref 0.0–0.1)
Basophils Relative: 1 %
Eosinophils Absolute: 0.2 10*3/uL (ref 0.0–0.5)
Eosinophils Relative: 3 %
HCT: 36.3 % (ref 36.0–46.0)
Hemoglobin: 12.3 g/dL (ref 12.0–15.0)
Immature Granulocytes: 1 %
Lymphocytes Relative: 10 %
Lymphs Abs: 0.9 10*3/uL (ref 0.7–4.0)
MCH: 29.1 pg (ref 26.0–34.0)
MCHC: 33.9 g/dL (ref 30.0–36.0)
MCV: 85.8 fL (ref 80.0–100.0)
Monocytes Absolute: 0.8 10*3/uL (ref 0.1–1.0)
Monocytes Relative: 9 %
Neutro Abs: 6.6 10*3/uL (ref 1.7–7.7)
Neutrophils Relative %: 76 %
Platelets: 238 10*3/uL (ref 150–400)
RBC: 4.23 MIL/uL (ref 3.87–5.11)
RDW: 13.2 % (ref 11.5–15.5)
WBC: 8.6 10*3/uL (ref 4.0–10.5)
nRBC: 0 % (ref 0.0–0.2)

## 2020-05-14 LAB — HEPARIN LEVEL (UNFRACTIONATED)
Heparin Unfractionated: 0.17 IU/mL — ABNORMAL LOW (ref 0.30–0.70)
Heparin Unfractionated: 0.27 IU/mL — ABNORMAL LOW (ref 0.30–0.70)
Heparin Unfractionated: 0.37 IU/mL (ref 0.30–0.70)
Heparin Unfractionated: 0.39 IU/mL (ref 0.30–0.70)

## 2020-05-14 LAB — URINE CULTURE

## 2020-05-14 LAB — MAGNESIUM: Magnesium: 2 mg/dL (ref 1.7–2.4)

## 2020-05-14 LAB — PHOSPHORUS: Phosphorus: 3.2 mg/dL (ref 2.5–4.6)

## 2020-05-14 LAB — GLUCOSE, CAPILLARY: Glucose-Capillary: 124 mg/dL — ABNORMAL HIGH (ref 70–99)

## 2020-05-14 MED ORDER — HEPARIN BOLUS VIA INFUSION
2500.0000 [IU] | Freq: Once | INTRAVENOUS | Status: AC
  Administered 2020-05-14: 2500 [IU] via INTRAVENOUS
  Filled 2020-05-14: qty 2500

## 2020-05-14 MED ORDER — WARFARIN - PHARMACIST DOSING INPATIENT: Freq: Every day | Status: DC

## 2020-05-14 MED ORDER — HEPARIN (PORCINE) 25000 UT/250ML-% IV SOLN
2350.0000 [IU]/h | INTRAVENOUS | Status: DC
  Administered 2020-05-14 – 2020-05-17 (×9): 2450 [IU]/h via INTRAVENOUS
  Administered 2020-05-18: 2350 [IU]/h via INTRAVENOUS
  Filled 2020-05-14 (×12): qty 250

## 2020-05-14 MED ORDER — WARFARIN SODIUM 5 MG PO TABS
10.0000 mg | ORAL_TABLET | Freq: Once | ORAL | Status: AC
  Administered 2020-05-14: 10 mg via ORAL
  Filled 2020-05-14: qty 2

## 2020-05-14 NOTE — Progress Notes (Signed)
ANTICOAGULATION CONSULT NOTE - Brief Note  Pharmacy Consult for Heparin Indication: pulmonary embolus    Patient Measurements: Height: 5\' 2"  (157.5 cm) Weight: 130.8 kg (288 lb 5.8 oz) IBW/kg (Calculated) : 50.1 Heparin Dosing Weight: 82 kg  Medications:  Infusions:  . sodium chloride    . heparin 2,250 Units/hr (05/14/20 1610)    Assessment: See full assessment earlier today by S.[place]land PharmD.  Briefly, Heparin IV per pharmacy for new PE, also starting warfarin per pharmacy on 4/7.  Heparin level decreased to 0.27, subtherapeutic on heparin 2250 units/hr.  No s/s bleeding or complications reported.  No IV interruptions or pauses.    Goal of Therapy:  INR 2-3 Heparin level 0.3-0.7 units/ml Monitor platelets by anticoagulation protocol: Yes   Plan:  Increase to heparin IV infusion at 2450 units/hr Heparin level 6 hours after rate change Daily heparin level and CBC Warfarin at 1600 per previous orders.   Gretta Arab PharmD, BCPS Clinical Pharmacist [facility] main pharmacy [phone] 05/14/2020 1:56 PM

## 2020-05-14 NOTE — Progress Notes (Addendum)
ANTICOAGULATION CONSULT NOTE - Follow Up Consult  Pharmacy Consult for heparin/warfarin Indication: acute pulmonary embolus  Allergies  Allergen Reactions  . Adhesive [Tape] Other (See Comments)    Skin Burn.  [name] [Menthol (Topical Analgesic)] Other (See Comments)    Skin Burn.  . Sulfa Antibiotics Rash   Patient Measurements: Height: 5\' 2"  (157.5 cm) Weight: 130.8 kg (288 lb 5.8 oz) IBW/kg (Calculated) : 50.1 Heparin Dosing Weight: 82 kg  Vital Signs: Temp: 98.3 F (36.8 C) (04/07 0800) Temp Source: Oral (04/07 0800) BP: 161/111 (04/07 1000) Pulse Rate: 108 (04/07 0800)  Labs: Recent Labs    05/12/20 1044 05/12/20 1240 05/12/20 1853 05/13/20 0236 05/13/20 1104 05/13/20 1825 05/14/20 0257 05/14/20 1009  HGB 13.4  --   --  12.2  --   --  12.3  --   HCT 38.8  --   --  36.8  --   --  36.3  --   PLT 284  --   --  233  --   --  238  --   LABPROT 13.0  --   --   --   --   --   --   --   INR 1.0  --   --   --   --   --   --   --   HEPARINUNFRC  --   --    < > 0.19*   < > 0.34 0.17* 0.37  CREATININE 0.83  --   --  0.84  --   --  0.63  --   TROPONINIHS 9 14  --   --   --   --   --   --    < > = values in this interval not displayed.   Estimated Creatinine Clearance: 111.9 mL/min (by C-G formula based on SCr of 0.63 mg/dL).  Assessment: Patient is a 50 y.o F with hx DVT in 2018 presented to [facility] ED with c/o SOB and CP. Chest CT showed bilateral PE with evidence of right heart strain consistent with at least submassive PE. She's currently on heparin drip for VTE.  Today, 05/14/2020: - HL at goal at 0.37 - CBC stable - no bleeding or infusion-related issues per RN  Goal of Therapy:  Heparin level 0.3-0.7 units/ml Monitor platelets by anticoagulation protocol: Yes   Plan:  Continue Heparin infusion at 2250 units/hr - check 6 hr heparin level - monitor for s/sx bleeding - plan to transition to Eliquis once stable - ? Stop megace  Napoleon Form   05/14/2020, 11:33 AM  Addendum 05/14/20 12:08 PM  - Discussed with MD, patient is not a candidate for a DOAC due to interaction with carbamazepine; plan to initiate warfarin while bridging with heparin - Anticipate need for higher dose also due to carbamazepine enzyme induction with warfarin - Will initiate warfarin 10 mg x 1 and f/u AM INR  Ulice Dash D  05/14/20 12:15 PM

## 2020-05-14 NOTE — Progress Notes (Signed)
ANTICOAGULATION CONSULT NOTE - Follow Up Consult  Pharmacy Consult for heparin Indication: acute pulmonary embolus  Allergies  Allergen Reactions  . Adhesive [Tape] Other (See Comments)    Skin Burn.  [name] [Menthol (Topical Analgesic)] Other (See Comments)    Skin Burn.  . Sulfa Antibiotics Rash   Patient Measurements: Height: 5\' 2"  (157.5 cm) Weight: 130.8 kg (288 lb 5.8 oz) IBW/kg (Calculated) : 50.1 Heparin Dosing Weight: 82 kg  Vital Signs: Temp: 98.4 F (36.9 C) (04/06 2347) Temp Source: Oral (04/06 2347) BP: 157/85 (04/06 2100) Pulse Rate: 108 (04/06 2100)  Labs: Recent Labs    05/12/20 1044 05/12/20 1240 05/12/20 1853 05/13/20 0236 05/13/20 1104 05/13/20 1825 05/14/20 0257  HGB 13.4  --   --  12.2  --   --  12.3  HCT 38.8  --   --  36.8  --   --  36.3  PLT 284  --   --  233  --   --  238  LABPROT 13.0  --   --   --   --   --   --   INR 1.0  --   --   --   --   --   --   HEPARINUNFRC  --   --    < > 0.19* 0.18* 0.34 0.17*  CREATININE 0.83  --   --  0.84  --   --  0.63  TROPONINIHS 9 14  --   --   --   --   --    < > = values in this interval not displayed.   Estimated Creatinine Clearance: 111.9 mL/min (by C-G formula based on SCr of 0.63 mg/dL).  Assessment: Patient is a 50 y.o F with hx DVT in 2018 presented to [facility] ED with c/o SOB and CP. Chest CT showed bilateral PE with evidence of right heart strain consistent with at least submassive PE. She's currently on heparin drip for VTE.  Today, 05/14/2020: HL 0.17 subtherapeutic on 2100 units/hr CBC WNL Per RN no bleeding or line issues  Goal of Therapy:  Heparin level 0.3-0.7 units/ml Monitor platelets by anticoagulation protocol: Yes   Plan:  Bolus heparin 2500 units Increase heparin drip to 2250 units/hr check 6 hr heparin level monitor for s/sx bleeding plan to transition to Eliquis once stable - ? Stop megace  Dolly Rias RPh 05/14/2020, 3:42 AM

## 2020-05-14 NOTE — TOC Initial Note (Signed)
Transition of Care [facility]) - Initial/Assessment Note    Patient Details  Name: Sharon Rivers MRN: [id number] Date of Birth: 1970/05/17  Transition of Care [facility]) CM/SW Contact:    Leeroy Cha, RN Phone Number: 05/14/2020, 8:02 AM  Clinical Narrative:                 :  The patient is a 50 year old super morbidly obese Caucasian female with a past medical history significant for prior ankle DVT on anticoagulation with Xarelto as well as history of menorrhagia chronically on Megace, hypertension, hypothyroidism, insulin resistance, nonpitting edema in the lower extremities as well as other comorbidities who presented with a chief complaint of right-sided chest pain last few days.  Chest pain has gotten worse on the morning of admission was associated with right scapular pain and was constant in nature.  She also had some shortness of breath but noticed no significant worsening lower extremity edema palpation.  She presented med center [facility] and subsequently transferred to [facility] found to have an acute pulmonary embolus on CTA of the chest with CT evidence of right heart strain consistent with early submassive PE.  There is also small right effusion and known bilateral peripheral areas of groundglass and airspace consolidation compatible with pulmonary infarct.  CT of the abdomen pelvis showed no acute findings.  In the ED she was tachypneic, tachycardic with her blood pressure being stable and she was initiated on IV heparin.  Currently she is getting further work-up and treatment and lower extremity venous duplex does show right lower extremity DVT.  Echocardiogram is pending to be done still.  PT OT to evaluate once she is at least 24 hours of therapeutic heparin. [id number] Weight is 288.50 ponds Actively is working Lives alone with slef care Plan is to return to home with slef care.  Expected Discharge Plan: Home/Self Care Barriers to Discharge: Continued Medical Work  up   Patient Goals and CMS Choice Patient states their goals for this hospitalization and ongoing recovery are:: to go home CMS Medicare.gov Compare Post Acute Care list provided to:: Patient    Expected Discharge Plan and Services Expected Discharge Plan: Home/Self Care   Discharge Planning Services: CM Consult   Living arrangements for the past 2 months: Single Family Home                                      Prior Living Arrangements/Services Living arrangements for the past 2 months: Single Family Home Lives with:: Self Patient language and need for interpreter reviewed:: Yes Do you feel safe going back to the place where you live?: Yes      Need for Family Participation in Patient Care: No (Comment) Care giver support system in place?: No (comment)   Criminal Activity/Legal Involvement Pertinent to Current Situation/Hospitalization: No - Comment as needed  Activities of Daily Living Home Assistive Devices/Equipment: None ADL Screening (condition at time of admission) Patient's cognitive ability adequate to safely complete daily activities?: No Is the patient deaf or have difficulty hearing?: No Does the patient have difficulty seeing, even when wearing glasses/contacts?: No Does the patient have difficulty concentrating, remembering, or making decisions?: No Patient able to express need for assistance with ADLs?: No Does the patient have difficulty dressing or bathing?: No Independently performs ADLs?: No Communication: Independent Dressing (OT): Independent Grooming: Independent Feeding: Independent Bathing: Independent Toileting: Independent In/Out Bed: Independent Walks  in Home: Independent Does the patient have difficulty walking or climbing stairs?: Yes Weakness of Legs: None Weakness of Arms/Hands: None  Permission Sought/Granted                  Emotional Assessment Appearance:: Appears stated age Attitude/Demeanor/Rapport:  Engaged Affect (typically observed): Calm Orientation: : Oriented to Self,Oriented to Place,Oriented to  Time,Oriented to Situation Alcohol / Substance Use: Not Applicable Psych Involvement: No (comment)  Admission diagnosis:  Acute pulmonary embolism ([place]) [I26.99] Pulmonary embolism and infarction [facility]) [I26.99] Patient Active Problem List   Diagnosis Date Noted  . Acute pulmonary embolism ([place]) 05/12/2020  . Pulmonary embolism and infarction ([place])   . Trigeminal neuralgia 04/21/2020  . Synovitis of toe 04/09/2020  . Essential hypertension 03/08/2017  . Other specified hypothyroidism 03/08/2017  . Class 3 obesity with serious comorbidity and body mass index (BMI) of 45.0 to 49.9 in adult 09/26/2016  . Insulin resistance 06/13/2016  . Vitamin D deficiency 04/12/2016  . OSA (obstructive sleep apnea) 03/17/2016  . Insomnia 02/23/2016  . GERD (gastroesophageal reflux disease) 02/23/2016  . Migraines 02/23/2016  . Depression 02/23/2016  . Hypertension 07/22/2015  . Hypothyroid 07/22/2015  . Family history of early CAD 07/22/2015  . Bilateral carpal tunnel syndrome 04/30/2015  . Headache 12/12/2013  . Heartburn 12/12/2013  . Morbid obesity ([place]) 09/12/2013  . Chondromalacia patellae 09/12/2013  . Arthropathy 07/14/2012   PCP:  [name], NP Pharmacy:   [place], [address] [place] [address] Phone: [phone] Fax: [phone]     Social Determinants of Health (SDOH) Interventions    Readmission Risk Interventions No flowsheet data found.

## 2020-05-14 NOTE — Evaluation (Signed)
Occupational Therapy Evaluation Patient Details Name: Sharon Rivers MRN: [id number] DOB: [birth year] Today's Date: 05/14/2020    History of Present Illness The patient is a 50 year old super morbidly obese Caucasian female with a past medical history significant for prior ankle DVT on anticoagulation with Xarelto as well as history of menorrhagia chronically on Megace, hypertension, hypothyroidism, insulin resistance, nonpitting edema in the lower extremities as well as other comorbidities who presented with a chief complaint of right-sided chest pain last few days. She presented med center [facility] and subsequently transferred to [facility] found to have an acute pulmonary embolus on CTA of the chest   Clinical Impression   Ms. Sharon Rivers is a 50 year old woman who is normally independent and works as a Paediatric nurse. Per RN patient ambulating to bathroom and tolerated walking o2 sat prior to OT evaluation. Patient demonstrates normal ROM and strength of upper extremities, able to perform bed mobility, standing and able to resist anterior nudges to chest without loss of balance. Patient reports independence with toileting and demonstrates ability to don socks. Patient's HR up to 122 with activity (bed transfer) with mild reports of shortness of breath with activity but o2 sat normal. Patient has no physical deficits just continued reports of shortness of breath with activity. Therapist educated patient on principles of energy conservation and recommended use of shower chair on return home. Patient verbalized understanding. No OT needs at this time.    Follow Up Recommendations  No OT follow up    Equipment Recommendations  None recommended by OT    Recommendations for Other Services       Precautions / Restrictions Precautions Precautions: None Restrictions Weight Bearing Restrictions: No      Mobility Bed Mobility Overal bed mobility: Modified Independent                   Transfers Overall transfer level: Modified independent                    Balance Overall balance assessment: No apparent balance deficits (not formally assessed)                                         ADL either performed or assessed with clinical judgement   ADL Overall ADL's : Modified independent                                       General ADL Comments: Limited by lines and leads only.     Vision Patient Visual Report: No change from baseline       Perception     Praxis      Pertinent Vitals/Pain Pain Assessment: No/denies pain     Hand Dominance     Extremity/Trunk Assessment Upper Extremity Assessment Upper Extremity Assessment: Overall WFL for tasks assessed   Lower Extremity Assessment Lower Extremity Assessment: Overall WFL for tasks assessed   Cervical / Trunk Assessment Cervical / Trunk Assessment: Normal   Communication     Cognition Arousal/Alertness: Awake/alert Behavior During Therapy: WFL for tasks assessed/performed Overall Cognitive Status: Within Functional Limits for tasks assessed  General Comments       Exercises     Shoulder Instructions      Home Living                                          Prior Functioning/Environment                   OT Problem List:        OT Treatment/Interventions:      OT Goals(Current goals can be found in the care plan section) Acute Rehab OT Goals OT Goal Formulation: All assessment and education complete, DC therapy  OT Frequency:     Barriers to D/C:            Co-evaluation              AM-PAC OT "6 Clicks" Daily Activity     Outcome Measure Help from another person eating meals?: None Help from another person taking care of personal grooming?: None Help from another person toileting, which includes using toliet, bedpan, or urinal?: None Help from  another person bathing (including washing, rinsing, drying)?: None Help from another person to put on and taking off regular upper body clothing?: None Help from another person to put on and taking off regular lower body clothing?: None 6 Click Score: 24   End of Session Nurse Communication: Mobility status  Activity Tolerance: Patient tolerated treatment well Patient left: in bed;with call bell/phone within reach  OT Visit Diagnosis: Muscle weakness (generalized) (M62.81)                Time: 4388-8757 OT Time Calculation (min): 13 min Charges:  OT General Charges $OT Visit: 1 Visit OT Evaluation $OT Eval Low Complexity: 1 Low  Ariauna Farabee, OTR/L [place]  Office [phone] Pager: [phone]   Lenward Chancellor 05/14/2020, 1:12 PM

## 2020-05-14 NOTE — Progress Notes (Signed)
Report called to [place] RN on 4E. All questions answered.

## 2020-05-14 NOTE — Progress Notes (Signed)
PROGRESS NOTE    Sharon Rivers  JEH:[id number] DOB: 1971/01/11 DOA: 05/12/2020 PCP: [name], NP   Brief Narrative:  The patient is a 50 year old super morbidly obese Caucasian female with a past medical history significant for prior ankle DVT on anticoagulation with Xarelto as well as history of menorrhagia chronically on Megace, hypertension, hypothyroidism, insulin resistance, nonpitting edema in the lower extremities as well as other comorbidities who presented with a chief complaint of right-sided chest pain last few days.  Chest pain has gotten worse on the morning of admission was associated with right scapular pain and was constant in nature.  She also had some shortness of breath but noticed no significant worsening lower extremity edema palpation.  She presented med center [facility] and subsequently transferred to [facility] found to have an acute pulmonary embolus on CTA of the chest with CT evidence of right heart strain consistent with early submassive PE.  There is also small right effusion and known bilateral peripheral areas of groundglass and airspace consolidation compatible with pulmonary infarct.  CT of the abdomen pelvis showed no acute findings.  In the ED she was tachypneic, tachycardic with her blood pressure being stable and she was initiated on IV heparin.  Currently she is getting further work-up and treatment and lower extremity venous duplex does show right lower extremity DVT.  Echocardiogram done and showed no Right Ventricular Dysfunction. PT OT to evaluate once she is at least 24 hours of therapeutic heparin.  Because she is on carbamazepine and it interacts with the apixaban and the rivaroxaban will change her to Coumadin and ensure that she is therapeutic prior to discharging.  Assessment & Plan:   Principal Problem:   Acute pulmonary embolism (HCC) Active Problems:   Morbid obesity ([place])   Hypertension   Hypothyroid   Family history of early CAD    GERD (gastroesophageal reflux disease)   Depression   OSA (obstructive sleep apnea)   Vitamin D deficiency   Insulin resistance   Heartburn   Essential hypertension   Other specified hypothyroidism   Trigeminal neuralgia   Pulmonary embolism and infarction (HCC)  Acute Bilateral Pulmonary Embolism with Associated Right Leg DVT -??  Etiology.   -Patient with no long car rides.  No recent surgeries.  No oral contraceptive use.  Patient however on Megace secondary to menorrhagia. -Patient noted to have an unprovoked DVT several years ago to her ankle and stated was on anticoagulation however cannot quite remember when that was and was on Xarelto then . -CT angio chest which was done concerning for submassive PE with concern for right ventricular strain. -Cardiac enzymes negative with First Troponin I being 9 and repeat being 14. -SpO2: 95 % O2 Flow Rate (L/min): 2 L/min; Was not wearing Supplemental O2 via  -Check a 2D echo and showed "Left Ventricle: Left ventricular ejection fraction, by estimation, is 60  to 65%. The left ventricle has normal function. The left ventricle has no regional wall motion abnormalities. Definity contrast agent was given IV to delineate the left ventricular endocardial borders. The left ventricular internal cavity size was normal in size. There is mild left ventricular hypertrophy. Left ventricular diastolic parameters are consistent with Grade I diastolic dysfunction  (impaired relaxation). Normal left  ventricular filling pressure.  Right Ventricle: The right ventricular size is normal. No increase in  right ventricular wall thickness. Right ventricular systolic function is normal." ; BNP was 7.5 -Lower extremity Dopplers showed "Findings consistent with acute deep vein thrombosis  involving the right popliteal vein, and right posterior tibial veins."  -PESI Class at Least I due to Age; Will rule out Heart Failure  -Continue IV heparin and could likely transition to  oral anticoagulation if patient continues to remain stable in about 48 to 72 hours and was considering changing to Apixaban but given that she is on Carbamazepine will not change to a DOAC given that DOAC's are strong inhibitors of CYP3A4 -**Have changed the patient to Coumadin and have Heparin gtt now; will need Therapeutic INR and last INR was 1.0 -As this is patient's second event will likely need anticoagulation lifetime. -If patient's condition deteriorates will need to formally consult with PCCM for further evaluation and management. -Will need an Ambulatory Home O2 Screen prior to D/C and PT/OT to evaluate at least after 24 hours of Therapeutic Heparin gtt   Hypertension -Was holding antihypertensive medications but will resume now with Amlodipine 5 mg  -Continue to Monitor BP per Protocol -Last BP was 179/104 and elevated  Gastroesophageal Reflux Disease -C/w PPI with Pantoprazole 40 mg po BID  Hypothyroidism -Check TSH in the AM  -Continue home regimen Levothyroxine 100 mcg po Daily .  Depression/Anxiety -Continue home regimen Sertraline 100 po Daily.   -C/w Alprazolam 0.25 mg po BID as needed Anxiety  Trigeminal Neuralgia -Continue home regimen of Carbamazepine 100 mg po BID for now   Metabolic Acidosis -Mild. Patient's CO2 is 20, AG is 9, and Chloride is 103 today -Was getting NS at 100 mL/hr x 1 day and now stopped  -Continue to Monitor and Trend -Repeat CMP this AM  Hyponatremia -Patient's Na+ went from 133 -> 138 -> 132 -Continue to Monitor and Trend -Repeat CMP in the AM   Hyperglycemia -Patient's Blood Sugar ranging from 100-119 -Check Hemoglobin A1c in the AM  -Continue to Monitor and Trend Blood Sugars carefully and if necessary will add Sensitive Novolog SSI AC   Super Morbid Obesity -Complicates overall prognosis and care -Estimated body mass index is 52.74 kg/m as calculated from the following:   Height as of this encounter: 5\' 2"  (1.575 m).    Weight as of this encounter: 130.8 kg. -Weight Loss and Dietary Counseling given   DVT prophylaxis: Anticoagulated with Heparin gtt Code Status: FULL CODE  Family Communication: No family present at bedside Disposition Plan: Pending further clinical Workup but anticipate D/C in the next 24-48 hours  Status is: Inpatient  Remains inpatient appropriate because:Unsafe d/c plan, IV treatments appropriate due to intensity of illness or inability to take PO and Inpatient level of care appropriate due to severity of illness   Dispo: The patient is from: Home              Anticipated d/c is to: Home              Patient currently is not medically stable to d/c.   Difficult to place patient No  Consultants:   None   Procedures:  LE Venous Duplex  Summary:  BILATERAL:  -No evidence of popliteal cyst, bilaterally.  RIGHT:  - Findings consistent with acute deep vein thrombosis involving the right  popliteal vein, and right posterior tibial veins.  - There is no evidence of superficial venous thrombosis.    LEFT:  - There is no evidence of deep vein thrombosis in the lower extremity.  - There is no evidence of superficial venous thrombosis.    ECHOCARDIOGRAM IMPRESSIONS    1. Left ventricular ejection fraction, by estimation, is  60 to 65%. The  left ventricle has normal function. The left ventricle has no regional  wall motion abnormalities. There is mild left ventricular hypertrophy.  Left ventricular diastolic parameters  are consistent with Grade I diastolic dysfunction (impaired relaxation).  2. Right ventricular systolic function is normal. The right ventricular  size is normal.  3. The mitral valve is grossly normal. No evidence of mitral valve  regurgitation.  4. The aortic valve was not well visualized. Aortic valve regurgitation  is not visualized.   Conclusion(s)/Recommendation(s): No evidence of right heart strain.   FINDINGS  Left Ventricle: Left  ventricular ejection fraction, by estimation, is 60  to 65%. The left ventricle has normal function. The left ventricle has no  regional wall motion abnormalities. Definity contrast agent was given IV  to delineate the left ventricular  endocardial borders. The left ventricular internal cavity size was normal  in size. There is mild left ventricular hypertrophy. Left ventricular  diastolic parameters are consistent with Grade I diastolic dysfunction  (impaired relaxation). Normal left  ventricular filling pressure.   Right Ventricle: The right ventricular size is normal. No increase in  right ventricular wall thickness. Right ventricular systolic function is  normal.   Left Atrium: Left atrial size was normal in size.   Right Atrium: Right atrial size was normal in size.   Pericardium: There is no evidence of pericardial effusion.   Mitral Valve: The mitral valve is grossly normal. No evidence of mitral  valve regurgitation.   Tricuspid Valve: The tricuspid valve is grossly normal. Tricuspid valve  regurgitation is trivial.   Aortic Valve: The aortic valve was not well visualized. Aortic valve  regurgitation is not visualized.   Pulmonic Valve: The pulmonic valve was grossly normal. Pulmonic valve  regurgitation is not visualized.   Aorta: The aortic root was not well visualized.   Venous: The inferior vena cava was not well visualized.   IAS/Shunts: The interatrial septum was not well visualized.     LEFT VENTRICLE  PLAX 2D  LVIDd:     4.30 cm Diastology  LVIDs:     2.80 cm LV e' medial:  7.94 cm/s  LV PW:     1.40 cm LV E/e' medial: 8.4  LV IVS:    1.00 cm LV e' lateral:  12.40 cm/s  LVOT diam:   2.00 cm LV E/e' lateral: 5.4  LV SV:     63  LV SV Index:  28  LVOT Area:   3.14 cm     RIGHT VENTRICLE       IVC  RV Basal diam: 2.40 cm   IVC diam: 2.10 cm  RV S prime:   10.70 cm/s   LEFT ATRIUM       Index     RIGHT ATRIUM     Index  LA diam:    3.30 cm 1.48 cm/m RA Area:   8.43 cm  LA Vol (A2C):  25.3 ml 11.33 ml/m RA Volume:  14.70 ml 6.58 ml/m  LA Vol (A4C):  21.0 ml 9.40 ml/m  LA Biplane Vol: 24.0 ml 10.75 ml/m  AORTIC VALVE  LVOT Vmax:  129.00 cm/s  LVOT Vmean: 75.100 cm/s  LVOT VTI:  0.200 m    AORTA  Ao Root diam: 3.10 cm  Ao Asc diam: 3.70 cm   MITRAL VALVE        TRICUSPID VALVE  MV Area (PHT): 3.50 cm  TR Peak grad:  27.2 mmHg  MV Decel Time:  217 msec  TR Vmax:    261.00 cm/s  MV E velocity: 66.70 cm/s  MV A velocity: 73.45 cm/s SHUNTS  MV E/A ratio: 0.91    Systemic VTI: 0.20 m               Systemic Diam: 2.00 cm   Antimicrobials:  Anti-infectives (From admission, onward)   None        Subjective: Seen and examined at bedside and she states that she was not feeling well this morning and vomited but then improved.  Denied any lightheadedness or dizzy but still gets very short of breath especially when taking deep breath or trying to do anything with exertion.  No other concerns or complaints at this time.  Objective: Vitals:   05/14/20 0731 05/14/20 0800 05/14/20 1000 05/14/20 1200  BP: (!) 154/107 (!) 163/101 (!) 161/111 (!) 179/104  Pulse:  (!) 108  (!) 102  Resp:  (!) 28  (!) 29  Temp:  98.3 F (36.8 C)  98 F (36.7 C)  TempSrc:  Oral  Oral  SpO2:    95%  Weight:      Height:        Intake/Output Summary (Last 24 hours) at 05/14/2020 1234 Last data filed at 05/14/2020 1000 Gross per 24 hour  Intake 580.92 ml  Output --  Net 580.92 ml   Filed Weights   05/12/20 1013 05/13/20 0500  Weight: 127 kg 130.8 kg   Examination: Physical Exam:  Constitutional: WN/WD super morbidly obese Caucasian female currently in mild distress appears calm but slightly uncomfortable  Eyes: Lids and conjunctivae normal, sclerae anicteric  ENMT: External Ears, Nose appear normal. Grossly normal hearing.   Neck: Appears normal, supple, no cervical masses, normal ROM, no appreciable thyromegaly; no JVD Respiratory: Diminished to auscultation bilaterally with slightly coarse breath sounds, no wheezing, rales, rhonchi or crackles. Normal respiratory effort and patient is not tachypenic. No accessory muscle use.  Unlabored breathing Cardiovascular: RRR, no murmurs / rubs / gallops. S1 and S2 auscultated.  Has some lower extremity edema worse on the right compared to left Abdomen: Soft, non-tender, distended secondary body. Bowel sounds positive.  GU: Deferred. Musculoskeletal: No clubbing / cyanosis of digits/nails. No joint deformity upper and lower extremities.  Skin: No rashes, lesions, ulcers on limited skin evaluation. No induration; Warm and dry.  Neurologic: CN 2-12 grossly intact with no focal deficits. Romberg sign and cerebellar reflexes not assessed.  Psychiatric: Normal judgment and insight. Alert and oriented x 3. Normal mood and appropriate affect.   Data Reviewed: I have personally reviewed following labs and imaging studies  CBC: Recent Labs  Lab 05/12/20 1044 05/13/20 0236 05/14/20 0257  WBC 8.6 7.9 8.6  NEUTROABS 6.5  --  6.6  HGB 13.4 12.2 12.3  HCT 38.8 36.8 36.3  MCV 84.9 88.2 85.8  PLT 284 233 998   Basic Metabolic Panel: Recent Labs  Lab 05/12/20 1044 05/13/20 0236 05/14/20 0257  NA 133* 138 132*  K 4.1 3.6 3.6  CL 102 109 103  CO2 23 21* 20*  GLUCOSE 101* 100* 119*  BUN 14 15 9   CREATININE 0.83 0.84 0.63  CALCIUM 9.1 8.6* 8.5*  MG  --  2.0 2.0  PHOS  --  3.8 3.2   GFR: Estimated Creatinine Clearance: 111.9 mL/min (by C-G formula based on SCr of 0.63 mg/dL). Liver Function Tests: Recent Labs  Lab 05/12/20 1044 05/13/20 0236 05/14/20 0257  AST 13* 13* 13*  ALT 14  12 14  ALKPHOS 61 57 55  BILITOT 0.3 0.9 0.8  PROT 7.5 6.7 6.9  ALBUMIN 3.8 3.6 3.5   Recent Labs  Lab 05/12/20 1044  LIPASE 28   No results for input(s): AMMONIA in the last  168 hours. Coagulation Profile: Recent Labs  Lab 05/12/20 1044  INR 1.0   Cardiac Enzymes: No results for input(s): CKTOTAL, CKMB, CKMBINDEX, TROPONINI in the last 168 hours. BNP (last 3 results) No results for input(s): PROBNP in the last 8760 hours. HbA1C: No results for input(s): HGBA1C in the last 72 hours. CBG: Recent Labs  Lab 05/13/20 0745 05/13/20 0900 05/13/20 0958 05/14/20 0749  GLUCAP 98 128* 145* 124*   Lipid Profile: No results for input(s): CHOL, HDL, LDLCALC, TRIG, CHOLHDL, LDLDIRECT in the last 72 hours. Thyroid Function Tests: No results for input(s): TSH, T4TOTAL, FREET4, T3FREE, THYROIDAB in the last 72 hours. Anemia Panel: No results for input(s): VITAMINB12, FOLATE, FERRITIN, TIBC, IRON, RETICCTPCT in the last 72 hours. Sepsis Labs: Recent Labs  Lab 05/12/20 1309 05/12/20 1702  LATICACIDVEN 0.7 0.9    Recent Results (from the past 240 hour(s))  Resp Panel by RT-PCR (Flu A&B, Covid) Nasopharyngeal Swab     Status: None   Collection Time: 05/12/20 12:40 PM   Specimen: Nasopharyngeal Swab; Nasopharyngeal(NP) swabs in vial transport medium  Result Value Ref Range Status   SARS Coronavirus 2 by RT PCR NEGATIVE NEGATIVE Final    Comment: (NOTE) SARS-CoV-2 target nucleic acids are NOT DETECTED.  The SARS-CoV-2 RNA is generally detectable in upper respiratory specimens during the acute phase of infection. The lowest concentration of SARS-CoV-2 viral copies this assay can detect is 138 copies/mL. A negative result does not preclude SARS-Cov-2 infection and should not be used as the sole basis for treatment or other patient management decisions. A negative result may occur with  improper specimen collection/handling, submission of specimen other than nasopharyngeal swab, presence of viral mutation(s) within the areas targeted by this assay, and inadequate number of viral copies(<138 copies/mL). A negative result must be combined with clinical  observations, patient history, and epidemiological information. The expected result is Negative.  Fact Sheet for Patients:  EntrepreneurPulse.com.au  Fact Sheet for Healthcare Providers:  IncredibleEmployment.be  This test is no t yet approved or cleared by the [place] FDA and  has been authorized for detection and/or diagnosis of SARS-CoV-2 by FDA under an Emergency Use Authorization (EUA). This EUA will remain  in effect (meaning this test can be used) for the duration of the COVID-19 declaration under Section 564(b)(1) of the Act, 21 U.S.C.section 360bbb-3(b)(1), unless the authorization is terminated  or revoked sooner.       Influenza A by PCR NEGATIVE NEGATIVE Final   Influenza B by PCR NEGATIVE NEGATIVE Final    Comment: (NOTE) The Xpert Xpress SARS-CoV-2/FLU/RSV plus assay is intended as an aid in the diagnosis of influenza from Nasopharyngeal swab specimens and should not be used as a sole basis for treatment. Nasal washings and aspirates are unacceptable for Xpert Xpress SARS-CoV-2/FLU/RSV testing.  Fact Sheet for Patients: EntrepreneurPulse.com.au  Fact Sheet for Healthcare Providers: IncredibleEmployment.be  This test is not yet approved or cleared by the [place] FDA and has been authorized for detection and/or diagnosis of SARS-CoV-2 by FDA under an Emergency Use Authorization (EUA). This EUA will remain in effect (meaning this test can be used) for the duration of the COVID-19 declaration under Section 564(b)(1) of the Act, 21 U.S.C. section 360bbb-3(b)(1), unless the  authorization is terminated or revoked.  Performed at [facility], [address] [place], [place] [address]   MRSA PCR Screening     Status: None   Collection Time: 05/12/20  4:38 PM   Specimen: Nasal Mucosa; Nasopharyngeal  Result Value Ref Range Status   MRSA by PCR NEGATIVE NEGATIVE Final     Comment:        The GeneXpert MRSA Assay (FDA approved for NASAL specimens only), is one component of a comprehensive MRSA colonization surveillance program. It is not intended to diagnose MRSA infection nor to guide or monitor treatment for MRSA infections. Performed at [facility], [place] [address], [place], [place] [address]   Urine culture     Status: Abnormal   Collection Time: 05/13/20  5:30 AM   Specimen: Urine, Clean Catch  Result Value Ref Range Status   Specimen Description   Final    URINE, CLEAN CATCH Performed at [facility], [place] [address] [place], [place] [address]    Special Requests   Final    NONE Performed at [facility], [place] [address] [place], [place] [address]    Culture MULTIPLE SPECIES PRESENT, SUGGEST RECOLLECTION (A)  Final   Report Status 05/14/2020 FINAL  Final    RN Pressure Injury Documentation:     Estimated body mass index is 52.74 kg/m as calculated from the following:   Height as of this encounter: 5\' 2"  (1.575 m).   Weight as of this encounter: 130.8 kg.  Malnutrition Type:   Malnutrition Characteristics:   Nutrition Interventions:    Radiology Studies: DG CHEST PORT 1 VIEW  Result Date: 05/14/2020 CLINICAL DATA:  Shortness of breath. EXAM: PORTABLE CHEST 1 VIEW COMPARISON:  05/12/2020. FINDINGS: Cardiomegaly. Mild pulmonary venous congestion. Mild atelectasis/infiltrate right mid lung. No pleural effusion or pneumothorax. IMPRESSION: 1.  Cardiomegaly with mild pulmonary venous congestion. 2.  Mild atelectasis/infiltrate right mid lung. Electronically Signed   By: Marcello Moores  Register   On: 05/14/2020 05:26   ECHOCARDIOGRAM COMPLETE  Result Date: 05/13/2020    ECHOCARDIOGRAM REPORT   Patient Name:   TATA TIMMINS Date of Exam: 05/13/2020 Medical Rec #:  [id number]     Height:       62.0 in Accession #:    [id number]    Weight:       288.4 lb Date of Birth:  14-Mar-1970    BSA:           2.233 m Patient Age:    84 years      BP:           156/89 mmHg Patient Gender: F             HR:           88 bpm. Exam Location:  Inpatient Procedure: 2D Echo, Cardiac Doppler, Color Doppler and Intracardiac            Opacification Agent Indications:    I26.02 Pulmonary embolus  History:        Patient has prior history of Echocardiogram examinations, most                 recent 04/15/2016. Signs/Symptoms:Chest Pain and Dyspnea; Risk                 Factors:Sleep Apnea and Hypertension. Hypothyroidism. GERD.  Sonographer:    Jonelle Sidle Dance Referring Phys: [id number] Eugenie Filler  Sonographer Comments: Patient is morbidly obese. Image  acquisition challenging due to patient body habitus. IMPRESSIONS  1. Left ventricular ejection fraction, by estimation, is 60 to 65%. The left ventricle has normal function. The left ventricle has no regional wall motion abnormalities. There is mild left ventricular hypertrophy. Left ventricular diastolic parameters are consistent with Grade I diastolic dysfunction (impaired relaxation).  2. Right ventricular systolic function is normal. The right ventricular size is normal.  3. The mitral valve is grossly normal. No evidence of mitral valve regurgitation.  4. The aortic valve was not well visualized. Aortic valve regurgitation is not visualized. Conclusion(s)/Recommendation(s): No evidence of right heart strain. FINDINGS  Left Ventricle: Left ventricular ejection fraction, by estimation, is 60 to 65%. The left ventricle has normal function. The left ventricle has no regional wall motion abnormalities. Definity contrast agent was given IV to delineate the left ventricular  endocardial borders. The left ventricular internal cavity size was normal in size. There is mild left ventricular hypertrophy. Left ventricular diastolic parameters are consistent with Grade I diastolic dysfunction (impaired relaxation). Normal left ventricular filling pressure. Right Ventricle: The right ventricular  size is normal. No increase in right ventricular wall thickness. Right ventricular systolic function is normal. Left Atrium: Left atrial size was normal in size. Right Atrium: Right atrial size was normal in size. Pericardium: There is no evidence of pericardial effusion. Mitral Valve: The mitral valve is grossly normal. No evidence of mitral valve regurgitation. Tricuspid Valve: The tricuspid valve is grossly normal. Tricuspid valve regurgitation is trivial. Aortic Valve: The aortic valve was not well visualized. Aortic valve regurgitation is not visualized. Pulmonic Valve: The pulmonic valve was grossly normal. Pulmonic valve regurgitation is not visualized. Aorta: The aortic root was not well visualized. Venous: The inferior vena cava was not well visualized. IAS/Shunts: The interatrial septum was not well visualized.  LEFT VENTRICLE PLAX 2D LVIDd:         4.30 cm  Diastology LVIDs:         2.80 cm  LV e' medial:    7.94 cm/s LV PW:         1.40 cm  LV E/e' medial:  8.4 LV IVS:        1.00 cm  LV e' lateral:   12.40 cm/s LVOT diam:     2.00 cm  LV E/e' lateral: 5.4 LV SV:         63 LV SV Index:   28 LVOT Area:     3.14 cm  RIGHT VENTRICLE             IVC RV Basal diam:  2.40 cm     IVC diam: 2.10 cm RV S prime:     10.70 cm/s LEFT ATRIUM             Index       RIGHT ATRIUM          Index LA diam:        3.30 cm 1.48 cm/m  RA Area:     8.43 cm LA Vol (A2C):   25.3 ml 11.33 ml/m RA Volume:   14.70 ml 6.58 ml/m LA Vol (A4C):   21.0 ml 9.40 ml/m LA Biplane Vol: 24.0 ml 10.75 ml/m  AORTIC VALVE LVOT Vmax:   129.00 cm/s LVOT Vmean:  75.100 cm/s LVOT VTI:    0.200 m  AORTA Ao Root diam: 3.10 cm Ao Asc diam:  3.70 cm MITRAL VALVE  TRICUSPID VALVE MV Area (PHT): 3.50 cm    TR Peak grad:   27.2 mmHg MV Decel Time: 217 msec    TR Vmax:        261.00 cm/s MV E velocity: 66.70 cm/s MV A velocity: 73.45 cm/s  SHUNTS MV E/A ratio:  0.91        Systemic VTI:  0.20 m                            Systemic  Diam: 2.00 cm Lyman Bishop MD Electronically signed by Lyman Bishop MD Signature Date/Time: 05/13/2020/12:44:40 PM    Final    VAS [name] LOWER EXTREMITY VENOUS (DVT)  Result Date: 05/13/2020  Lower Venous DVT Study Indications: Pulmonary embolism.  Risk Factors: DVT HX. Comparison Study: 05/30/17 - LLE DVT PTV Performing Technologist: Rogelia Rohrer  Examination Guidelines: A complete evaluation includes B-mode imaging, spectral Doppler, color Doppler, and power Doppler as needed of all accessible portions of each vessel. Bilateral testing is considered an integral part of a complete examination. Limited examinations for reoccurring indications may be performed as noted. The reflux portion of the exam is performed with the patient in reverse Trendelenburg.  +---------+---------------+---------+-----------+----------+------------------+ RIGHT    CompressibilityPhasicitySpontaneityPropertiesThrombus Aging     +---------+---------------+---------+-----------+----------+------------------+ CFV      Full                                                            +---------+---------------+---------+-----------+----------+------------------+ SFJ      Full                                                            +---------+---------------+---------+-----------+----------+------------------+ FV Prox  Full                                                            +---------+---------------+---------+-----------+----------+------------------+ FV Mid   Full                                         Not well                                                                 visualized         +---------+---------------+---------+-----------+----------+------------------+ FV DistalFull                                         Not well  visualized          +---------+---------------+---------+-----------+----------+------------------+ PFV      Full                                                            +---------+---------------+---------+-----------+----------+------------------+ POP      None           No       No                   Acute              +---------+---------------+---------+-----------+----------+------------------+ PTV      None           No       No                   Acute              +---------+---------------+---------+-----------+----------+------------------+ PERO                                                  Not seen on this                                                         exam               +---------+---------------+---------+-----------+----------+------------------+   +---------+---------------+---------+-----------+----------+-------------------+ LEFT     CompressibilityPhasicitySpontaneityPropertiesThrombus Aging      +---------+---------------+---------+-----------+----------+-------------------+ CFV      Full           Yes      Yes                                      +---------+---------------+---------+-----------+----------+-------------------+ SFJ      Full                                                             +---------+---------------+---------+-----------+----------+-------------------+ FV Prox  Full           Yes      Yes                                      +---------+---------------+---------+-----------+----------+-------------------+ FV Mid   Full           Yes      Yes                                      +---------+---------------+---------+-----------+----------+-------------------+ FV DistalFull           Yes  Yes                  Not well visualized +---------+---------------+---------+-----------+----------+-------------------+ PFV      Full                                                              +---------+---------------+---------+-----------+----------+-------------------+ POP      Full           Yes      Yes                                      +---------+---------------+---------+-----------+----------+-------------------+ PTV      Full                                                             +---------+---------------+---------+-----------+----------+-------------------+ PERO     Full                                         Not well visualized +---------+---------------+---------+-----------+----------+-------------------+    Summary: BILATERAL: -No evidence of popliteal cyst, bilaterally. RIGHT: - Findings consistent with acute deep vein thrombosis involving the right popliteal vein, and right posterior tibial veins. - There is no evidence of superficial venous thrombosis.  LEFT: - There is no evidence of deep vein thrombosis in the lower extremity. - There is no evidence of superficial venous thrombosis.  *See table(s) above for measurements and observations. Electronically signed by Harold Barban MD on 05/13/2020 at 9:58:32 PM.    Final    Scheduled Meds: . amLODipine  5 mg Oral Daily  . azelastine  1 spray Each Nare BID  . carbamazepine  100 mg Oral BID  . Chlorhexidine Gluconate Cloth  6 each Topical Daily  . levothyroxine  100 mcg Oral Daily  . megestrol  40 mg Oral BID  . montelukast  10 mg Oral QHS  . oxybutynin  5 mg Oral Daily  . pantoprazole  40 mg Oral BID  . sertraline  100 mg Oral Daily  . sodium chloride flush  3 mL Intravenous Q12H  . sodium chloride flush  3 mL Intravenous Q12H  . traZODone  100 mg Oral QHS  . warfarin  10 mg Oral ONCE-1600  . Warfarin - Pharmacist Dosing Inpatient   Does not apply q1600   Continuous Infusions: . sodium chloride    . heparin 2,250 Units/hr (05/14/20 0853)    LOS: 2 days   Kerney Elbe, DO [facility] PAGER is on [place]  If 7PM-7AM, please contact night-coverage [email or web address]

## 2020-05-14 NOTE — Progress Notes (Signed)
PT Cancellation Note  Patient Details Name: Sharon Rivers MRN: [id number] DOB: August 12, 1970   Cancelled Treatment:    Reason Eval/Treat Not Completed: Medical issues which prohibited therapy (BP remains elevated, HL 0.17 subtherapeutic)   Sharon Rivers,KATHrine E 05/14/2020, 8:45 AM Sharon Rivers, DPT Acute Rehabilitation Services Pager: [phone] Office: [phone]

## 2020-05-15 DIAGNOSIS — E559 Vitamin D deficiency, unspecified: Secondary | ICD-10-CM

## 2020-05-15 DIAGNOSIS — I82491 Acute embolism and thrombosis of other specified deep vein of right lower extremity: Secondary | ICD-10-CM

## 2020-05-15 DIAGNOSIS — E038 Other specified hypothyroidism: Secondary | ICD-10-CM

## 2020-05-15 LAB — COMPREHENSIVE METABOLIC PANEL
ALT: 15 U/L (ref 0–44)
AST: 14 U/L — ABNORMAL LOW (ref 15–41)
Albumin: 3.7 g/dL (ref 3.5–5.0)
Alkaline Phosphatase: 53 U/L (ref 38–126)
Anion gap: 10 (ref 5–15)
BUN: 9 mg/dL (ref 6–20)
CO2: 18 mmol/L — ABNORMAL LOW (ref 22–32)
Calcium: 8.7 mg/dL — ABNORMAL LOW (ref 8.9–10.3)
Chloride: 103 mmol/L (ref 98–111)
Creatinine, Ser: 0.74 mg/dL (ref 0.44–1.00)
GFR, Estimated: >60 mL/min (ref >60)
Glucose, Bld: 101 mg/dL — ABNORMAL HIGH (ref 70–99)
Potassium: 3.3 mmol/L — ABNORMAL LOW (ref 3.5–5.1)
Sodium: 131 mmol/L — ABNORMAL LOW (ref 135–145)
Total Bilirubin: 0.6 mg/dL (ref 0.3–1.2)
Total Protein: 7.3 g/dL (ref 6.5–8.1)

## 2020-05-15 LAB — CBC WITH DIFFERENTIAL/PLATELET
Abs Immature Granulocytes: 0.21 10*3/uL — ABNORMAL HIGH (ref 0.00–0.07)
Basophils Absolute: 0 10*3/uL (ref 0.0–0.1)
Basophils Relative: 0 %
Eosinophils Absolute: 0.2 10*3/uL (ref 0.0–0.5)
Eosinophils Relative: 2 %
HCT: 38.4 % (ref 36.0–46.0)
Hemoglobin: 12.8 g/dL (ref 12.0–15.0)
Immature Granulocytes: 2 %
Lymphocytes Relative: 9 %
Lymphs Abs: 0.8 10*3/uL (ref 0.7–4.0)
MCH: 29.2 pg (ref 26.0–34.0)
MCHC: 33.3 g/dL (ref 30.0–36.0)
MCV: 87.5 fL (ref 80.0–100.0)
Monocytes Absolute: 0.9 10*3/uL (ref 0.1–1.0)
Monocytes Relative: 9 %
Neutro Abs: 7.5 10*3/uL (ref 1.7–7.7)
Neutrophils Relative %: 78 %
Platelets: 259 10*3/uL (ref 150–400)
RBC: 4.39 MIL/uL (ref 3.87–5.11)
RDW: 13.4 % (ref 11.5–15.5)
WBC: 9.7 10*3/uL (ref 4.0–10.5)
nRBC: 0 % (ref 0.0–0.2)

## 2020-05-15 LAB — GLUCOSE, CAPILLARY: Glucose-Capillary: 97 mg/dL (ref 70–99)

## 2020-05-15 LAB — HEPARIN LEVEL (UNFRACTIONATED)
Heparin Unfractionated: 0.4 IU/mL (ref 0.30–0.70)
Heparin Unfractionated: 0.42 IU/mL (ref 0.30–0.70)

## 2020-05-15 LAB — PROTIME-INR
INR: 1 (ref 0.8–1.2)
Prothrombin Time: 13 seconds (ref 11.4–15.2)

## 2020-05-15 LAB — PHOSPHORUS: Phosphorus: 2.6 mg/dL (ref 2.5–4.6)

## 2020-05-15 LAB — TSH: TSH: 5.117 u[IU]/mL — ABNORMAL HIGH (ref 0.350–4.500)

## 2020-05-15 LAB — MAGNESIUM: Magnesium: 2 mg/dL (ref 1.7–2.4)

## 2020-05-15 MED ORDER — WARFARIN SODIUM 5 MG PO TABS
10.0000 mg | ORAL_TABLET | Freq: Once | ORAL | Status: AC
  Administered 2020-05-15: 10 mg via ORAL
  Filled 2020-05-15: qty 2

## 2020-05-15 MED ORDER — POTASSIUM CHLORIDE CRYS ER 20 MEQ PO TBCR
40.0000 meq | EXTENDED_RELEASE_TABLET | Freq: Two times a day (BID) | ORAL | Status: AC
  Administered 2020-05-15 (×2): 40 meq via ORAL
  Filled 2020-05-15 (×2): qty 2

## 2020-05-15 MED ORDER — SODIUM BICARBONATE 650 MG PO TABS
650.0000 mg | ORAL_TABLET | Freq: Two times a day (BID) | ORAL | Status: DC
  Administered 2020-05-15 – 2020-05-18 (×7): 650 mg via ORAL
  Filled 2020-05-15 (×7): qty 1

## 2020-05-15 NOTE — Plan of Care (Signed)
  Problem: Consults Goal: Pharmacy Consult for anticoagulation Outcome: Progressing Goal: Skin Care Protocol Initiated - if Braden Score 18 or less Description: If consults are not indicated, leave blank or document N/A Outcome: Progressing Goal: Nutrition Consult-if indicated Outcome: Progressing   Problem: Discharge Progression Outcomes Goal: Pain controlled with appropriate interventions Outcome: Progressing Goal: Activity appropriate for discharge plan Outcome: Progressing   Problem: Education: Goal: Knowledge of General Education information will improve Description: Including pain rating scale, medication(s)/side effects and non-pharmacologic comfort measures Outcome: Progressing   Problem: Activity: Goal: Risk for activity intolerance will decrease Outcome: Progressing   Problem: Coping: Goal: Level of anxiety will decrease Outcome: Progressing   Problem: Elimination: Goal: Will not experience complications related to urinary retention Outcome: Progressing   Problem: Pain Managment: Goal: General experience of comfort will improve Outcome: Progressing   Problem: Safety: Goal: Ability to remain free from injury will improve Outcome: Progressing   Problem: Skin Integrity: Goal: Risk for impaired skin integrity will decrease Outcome: Progressing

## 2020-05-15 NOTE — Evaluation (Signed)
Physical Therapy One Time Evaluation Patient Details Name: Sharon Rivers MRN: [id number] DOB: 1970-07-17 Today's Date: 05/15/2020   History of Present Illness  The patient is a 50 year old super morbidly obese Caucasian female with a past medical history significant for prior ankle DVT on anticoagulation with Xarelto as well as history of menorrhagia chronically on Megace, hypertension, hypothyroidism, insulin resistance, nonpitting edema in the lower extremities as well as other comorbidities who presented with a chief complaint of right-sided chest pain last few days. She presented med center [facility] and subsequently transferred to [facility] found to have an acute pulmonary embolus on CTA of the chest  Clinical Impression  Patient evaluated by Physical Therapy with no further acute PT needs identified. All education has been completed and the patient has no further questions.  Pt mobilizing well and reports SOB much improved compared to yesterday.  General Gait Details: pt pushed IV pole however not required for support; HR in 140s bpm and SpO2 92-93% on room air.  See below for any follow-up Physical Therapy or equipment needs. PT is signing off. Thank you for this referral.     Follow Up Recommendations No PT follow up    Equipment Recommendations  None recommended by PT    Recommendations for Other Services       Precautions / Restrictions Precautions Precautions: None      Mobility  Bed Mobility Overal bed mobility: Modified Independent                  Transfers Overall transfer level: Modified independent                  Ambulation/Gait Ambulation/Gait assistance: Modified independent (Device/Increase time) Gait Distance (Feet): 400 Feet Assistive device: Crutches Gait Pattern/deviations: WFL(Within Functional Limits)     General Gait Details: pt pushed IV pole however not required for support; HR in 140s bpm and SpO2 92-93% on room air  Stairs             Wheelchair Mobility    Modified Rankin (Stroke Patients Only)       Balance                                             Pertinent Vitals/Pain Pain Assessment: No/denies pain    Home Living Family/patient expects to be discharged to:: Private residence Living Arrangements: Spouse/significant other Available Help at Discharge: Family;Available 24 hours/day Type of Home: House Home Access: Stairs to enter   [employer] of Steps: 1 Home Layout: One level Home Equipment: None      Prior Function Level of Independence: Independent         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        Extremity/Trunk Assessment        Lower Extremity Assessment Lower Extremity Assessment: Overall WFL for tasks assessed    Cervical / Trunk Assessment Cervical / Trunk Assessment: Normal  Communication   Communication: No difficulties  Cognition Arousal/Alertness: Awake/alert Behavior During Therapy: WFL for tasks assessed/performed Overall Cognitive Status: Within Functional Limits for tasks assessed                                        General Comments  Exercises     Assessment/Plan    PT Assessment Patent does not need any further PT services  PT Problem List         PT Treatment Interventions      PT Goals (Current goals can be found in the Care Plan section)  Acute Rehab PT Goals PT Goal Formulation: All assessment and education complete, DC therapy    Frequency     Barriers to discharge        Co-evaluation               AM-PAC PT "6 Clicks" Mobility  Outcome Measure Help needed turning from your back to your side while in a flat bed without using bedrails?: None Help needed moving from lying on your back to sitting on the side of a flat bed without using bedrails?: None Help needed moving to and from a bed to a chair (including a wheelchair)?: None Help  needed standing up from a chair using your arms (e.g., wheelchair or bedside chair)?: None Help needed to walk in hospital room?: A Little Help needed climbing 3-5 steps with a railing? : A Little 6 Click Score: 22    End of Session Equipment Utilized During Treatment: Gait belt Activity Tolerance: Patient tolerated treatment well Patient left: in bed;with call bell/phone within reach Nurse Communication: Mobility status PT Visit Diagnosis: Difficulty in walking, not elsewhere classified (R26.2)    Time: 0950-1002 PT Time Calculation (min) (ACUTE ONLY): 12 min   Charges:   PT Evaluation $PT Eval Low Complexity: 1 Low         Kati PT, DPT Acute Rehabilitation Services Pager: [phone] Office: [phone]  York Ram E 05/15/2020, 11:47 AM

## 2020-05-15 NOTE — Progress Notes (Signed)
Brief Pharmacy anti-coagulation note:  See full assessment  by S.Christy PharmD on 4/7.  Briefly, Heparin IV per pharmacy for new PE  A/P: HL 0.39 therapeutic on 2450 units/hr No bleeding or line issues noted Continue heparin drip at 2450 units/hr  heparin level with am labs  daily CBC  Dolly Rias RPh 05/15/2020, 12:13 AM

## 2020-05-15 NOTE — Progress Notes (Addendum)
Notified on call provider about patient having 8 beats of V Tach.

## 2020-05-15 NOTE — Progress Notes (Signed)
ANTICOAGULATION CONSULT NOTE - Follow Up Consult  Pharmacy Consult for heparin/warfarin Indication: acute pulmonary embolus  Allergies  Allergen Reactions  . Adhesive [Tape] Other (See Comments)    Skin Burn.  [name] [Menthol (Topical Analgesic)] Other (See Comments)    Skin Burn.  . Sulfa Antibiotics Rash   Patient Measurements: Height: 5\' 2"  (157.5 cm) Weight: 130.4 kg (287 lb 7.7 oz) IBW/kg (Calculated) : 50.1 Heparin Dosing Weight: 83 kg  Vital Signs: Temp: 98 F (36.7 C) (04/08 0425) Temp Source: Oral (04/08 0425) BP: 130/88 (04/08 0425) Pulse Rate: 102 (04/08 0425)  Labs: Recent Labs    05/12/20 1044 05/12/20 1240 05/12/20 1853 05/13/20 0236 05/13/20 1104 05/14/20 0257 05/14/20 1009 05/14/20 1606 05/14/20 2330 05/15/20 0024 05/15/20 0524  HGB 13.4  --   --  12.2  --  12.3  --   --   --  12.8  --   HCT 38.8  --   --  36.8  --  36.3  --   --   --  38.4  --   PLT 284  --   --  233  --  238  --   --   --  259  --   LABPROT 13.0  --   --   --   --   --   --   --   --  13.0  --   INR 1.0  --   --   --   --   --   --   --   --  1.0  --   HEPARINUNFRC  --   --    < > 0.19*   < > 0.17*   < > 0.27* 0.39  --  0.40  CREATININE 0.83  --   --  0.84  --  0.63  --   --   --  0.74  --   TROPONINIHS 9 14  --   --   --   --   --   --   --   --   --    < > = values in this interval not displayed.   Estimated Creatinine Clearance: 111.6 mL/min (by C-G formula based on SCr of 0.74 mg/dL).  Assessment: Patient is a 50 y.o F with hx DVT in 2018 presented to [facility] ED with c/o SOB and CP. Chest CT showed bilateral PE with evidence of right heart strain consistent with at least submassive PE. Pharmacy consulted to dose/monitor heparin drip and warfarin.   Patient is not a candidate for DOAC due to drug interaction between DOAC and carbamazepine. Warfarin initiated on 4/7, currently remains on IV UFH for bridge.   Today, 05/15/2020:  INR = 1 remains subtherapeutic. Will  not expect to see warfarin initiation reflected in INR for at least 3-4 days.  Confirmatory HL = 0.42 remains therapeutic on heparin infusion of 2450 units/hr  Confirmed with RN that heparin infusing at correct rate. No signs of bleeding.  CBC: Stable, WNL  SCr:  Stable, WNL  Diet: Heart healthy; 100% meal intake charted  Drug interactions: Carbamazepine is a CYP 1A2, 3A4, and 2C9 inducer; may decrease INR/effects of warfarin. Pt may require higher doses of warfarin to overcome DDI and achieve therapeutic INR.  Goal of Therapy:  INR 2-3 Heparin level 0.3-0.7 units/ml Monitor platelets by anticoagulation protocol: Yes   Plan:   Continue heparin infusion at current rate of 2450 units/hr  Warfarin 10 mg x1 this afternoon  INR, HL daily. CBC daily while on heparin infusion  Monitor for signs/symptoms of bleeding  During initiation of warfarin, patient will require anticoagulation bridging with either LMWH or IV UFH for a minimum duration of 5 days AND until pt has 2 days consecutive therapeutic INR levels.  Lenis Noon, PharmD 05/15/2020, 7:57 AM

## 2020-05-15 NOTE — Progress Notes (Signed)
PROGRESS NOTE    Sharon Rivers  WPY:[id number] DOB: Oct 05, 1970 DOA: 05/12/2020 PCP: [name], NP   Brief Narrative:  The patient is a 50 year old super morbidly obese Caucasian female with a past medical history significant for prior ankle DVT on anticoagulation with Xarelto as well as history of menorrhagia chronically on Megace, hypertension, hypothyroidism, insulin resistance, nonpitting edema in the lower extremities as well as other comorbidities who presented with a chief complaint of right-sided chest pain last few days.  Chest pain has gotten worse on the morning of admission was associated with right scapular pain and was constant in nature.  She also had some shortness of breath but noticed no significant worsening lower extremity edema palpation.  She presented med center [facility] and subsequently transferred to [facility] found to have an acute pulmonary embolus on CTA of the chest with CT evidence of right heart strain consistent with early submassive PE.  There is also small right effusion and known bilateral peripheral areas of groundglass and airspace consolidation compatible with pulmonary infarct.  CT of the abdomen pelvis showed no acute findings.  In the ED she was tachypneic, tachycardic with her blood pressure being stable and she was initiated on IV heparin.  Currently she is getting further work-up and treatment and lower extremity venous duplex does show right lower extremity DVT.  Echocardiogram done and showed no Right Ventricular Dysfunction. PT OT to evaluate once she is at least 24 hours of therapeutic heparin.  Because she is on carbamazepine and it interacts with the apixaban and rivaroxaban, we will change her to Coumadin and ensure that she is therapeutic prior to discharging.  INR today was 1.0  Assessment & Plan:   Principal Problem:   Acute pulmonary embolism (HCC) Active Problems:   Morbid obesity ([place])   Hypertension   Hypothyroid   Family history  of early CAD   GERD (gastroesophageal reflux disease)   Depression   OSA (obstructive sleep apnea)   Vitamin D deficiency   Insulin resistance   Heartburn   Essential hypertension   Other specified hypothyroidism   Trigeminal neuralgia   Pulmonary embolism and infarction (HCC)  Acute Bilateral Pulmonary Embolism with Associated Right Leg DVT -??  Etiology.   -Patient with no long car rides.  No recent surgeries.  No oral contraceptive use.  Patient however on Megace secondary to menorrhagia. -Patient noted to have an unprovoked DVT several years ago to her ankle and stated was on anticoagulation however cannot quite remember when that was and was on Xarelto then . -CT angio chest which was done concerning for submassive PE with concern for right ventricular strain. -Cardiac enzymes negative with First Troponin I being 9 and repeat being 14. -SpO2: 95 % O2 Flow Rate (L/min): 2 L/min; Was not wearing Supplemental O2 via Uncertain -Check a 2D echo and showed "Left Ventricle: Left ventricular ejection fraction, by estimation, is 60  to 65%. The left ventricle has normal function. The left ventricle has no regional wall motion abnormalities. Definity contrast agent was given IV to delineate the left ventricular endocardial borders. The left ventricular internal cavity size was normal in size. There is mild left ventricular hypertrophy. Left ventricular diastolic parameters are consistent with Grade I diastolic dysfunction  (impaired relaxation). Normal left  ventricular filling pressure.  Right Ventricle: The right ventricular size is normal. No increase in  right ventricular wall thickness. Right ventricular systolic function is normal." ; BNP was 7.5 -Lower extremity Dopplers showed "Findings consistent  with acute deep vein thrombosis involving the right popliteal vein, and right posterior tibial veins."  -PESI Class at Least I due to Age; Will rule out Heart Failure  -Continue IV heparin and could  likely transition to oral anticoagulation if patient continues to remain stable in about 48 to 72 hours and was considering changing to Apixaban but given that she is on Carbamazepine will not change to a DOAC given that DOAC's are strong inhibitors of CYP3A4 -**Have changed the patient to Coumadin and have Heparin gtt now; will need Therapeutic INR and last INR was 1.0 and again is 1.0 today -As this is patient's second event will likely need anticoagulation lifetime. -If patient's condition deteriorates will need to formally consult with PCCM for further evaluation and management. -Will need an Ambulatory Home O2 Screen prior to D/C and PT/OT to evaluate recommending home health  Hypertension -Was holding antihypertensive medications but will resume now with Amlodipine 5 mg  -Continue to Monitor BP per Protocol -Last BP was 130/88  Gastroesophageal Reflux Disease -C/w PPI with Pantoprazole 40 mg po BID  Hypothyroidism -Checked TSH  And was 5.117 -Continue home regimen Levothyroxine 100 mcg po Daily -Repeat Thyroid Function Tests in 4-6 Weeks   Depression/Anxiety -Continue home regimen Sertraline 100 po Daily.   -C/w Alprazolam 0.25 mg po BID as needed Anxiety  Trigeminal Neuralgia -Continue home regimen of Carbamazepine 100 mg po BID for now   Metabolic Acidosis -Mild. Patient's CO2 is now 28, AG is 9, and Chloride is 103 today -Was getting NS at 100 mL/hr x 1 day and now stopped; Start po Sodium Bicarbonate 650 mg po BID -Continue to Monitor and Trend -Repeat CMP this AM  Hyponatremia -Patient's Na+ went from 133 -> 138 -> 132 -> 131 -Continue to Monitor and Trend; Sodium Bicarbonate supplementation as Above -Repeat CMP in the AM   Hypokalemia -Patient's K+ is 3.3 today -Replete with po KCl 40 mEQ BID x2 -Continue to Monitor and Trend -Repeat CMP in the AM   Hyperglycemia -Patient's Blood Sugar ranging from 100-119; This AM was 101  -Check Hemoglobin A1c in the AM   -Continue to Monitor and Trend Blood Sugars carefully and if necessary will add Sensitive Novolog SSI AC   Super Morbid Obesity -Complicates overall prognosis and care -Estimated body mass index is 52.58 kg/m as calculated from the following:   Height as of this encounter: 5\' 2"  (1.575 m).   Weight as of this encounter: 130.4 kg. -Weight Loss and Dietary Counseling given   DVT prophylaxis: Anticoagulated with Heparin gtt Code Status: FULL CODE  Family Communication: No family present at bedside Disposition Plan: Pending further clinical Workup and D/C once INR is therapeutic   Status is: Inpatient  Remains inpatient appropriate because:Unsafe d/c plan, IV treatments appropriate due to intensity of illness or inability to take PO and Inpatient level of care appropriate due to severity of illness   Dispo: The patient is from: Home              Anticipated d/c is to: Home              Patient currently is not medically stable to d/c.   Difficult to place patient No  Consultants:   None   Procedures:  LE Venous Duplex  Summary:  BILATERAL:  -No evidence of popliteal cyst, bilaterally.  RIGHT:  - Findings consistent with acute deep vein thrombosis involving the right  popliteal vein, and right posterior tibial veins.  -  There is no evidence of superficial venous thrombosis.    LEFT:  - There is no evidence of deep vein thrombosis in the lower extremity.  - There is no evidence of superficial venous thrombosis.    ECHOCARDIOGRAM IMPRESSIONS    1. Left ventricular ejection fraction, by estimation, is 60 to 65%. The  left ventricle has normal function. The left ventricle has no regional  wall motion abnormalities. There is mild left ventricular hypertrophy.  Left ventricular diastolic parameters  are consistent with Grade I diastolic dysfunction (impaired relaxation).  2. Right ventricular systolic function is normal. The right ventricular  size is normal.  3.  The mitral valve is grossly normal. No evidence of mitral valve  regurgitation.  4. The aortic valve was not well visualized. Aortic valve regurgitation  is not visualized.   Conclusion(s)/Recommendation(s): No evidence of right heart strain.   FINDINGS  Left Ventricle: Left ventricular ejection fraction, by estimation, is 60  to 65%. The left ventricle has normal function. The left ventricle has no  regional wall motion abnormalities. Definity contrast agent was given IV  to delineate the left ventricular  endocardial borders. The left ventricular internal cavity size was normal  in size. There is mild left ventricular hypertrophy. Left ventricular  diastolic parameters are consistent with Grade I diastolic dysfunction  (impaired relaxation). Normal left  ventricular filling pressure.   Right Ventricle: The right ventricular size is normal. No increase in  right ventricular wall thickness. Right ventricular systolic function is  normal.   Left Atrium: Left atrial size was normal in size.   Right Atrium: Right atrial size was normal in size.   Pericardium: There is no evidence of pericardial effusion.   Mitral Valve: The mitral valve is grossly normal. No evidence of mitral  valve regurgitation.   Tricuspid Valve: The tricuspid valve is grossly normal. Tricuspid valve  regurgitation is trivial.   Aortic Valve: The aortic valve was not well visualized. Aortic valve  regurgitation is not visualized.   Pulmonic Valve: The pulmonic valve was grossly normal. Pulmonic valve  regurgitation is not visualized.   Aorta: The aortic root was not well visualized.   Venous: The inferior vena cava was not well visualized.   IAS/Shunts: The interatrial septum was not well visualized.     LEFT VENTRICLE  PLAX 2D  LVIDd:     4.30 cm Diastology  LVIDs:     2.80 cm LV e' medial:  7.94 cm/s  LV PW:     1.40 cm LV E/e' medial: 8.4  LV IVS:    1.00 cm LV e'  lateral:  12.40 cm/s  LVOT diam:   2.00 cm LV E/e' lateral: 5.4  LV SV:     63  LV SV Index:  28  LVOT Area:   3.14 cm     RIGHT VENTRICLE       IVC  RV Basal diam: 2.40 cm   IVC diam: 2.10 cm  RV S prime:   10.70 cm/s   LEFT ATRIUM       Index    RIGHT ATRIUM     Index  LA diam:    3.30 cm 1.48 cm/m RA Area:   8.43 cm  LA Vol (A2C):  25.3 ml 11.33 ml/m RA Volume:  14.70 ml 6.58 ml/m  LA Vol (A4C):  21.0 ml 9.40 ml/m  LA Biplane Vol: 24.0 ml 10.75 ml/m  AORTIC VALVE  LVOT Vmax:  129.00 cm/s  LVOT Vmean: 75.100 cm/s  LVOT VTI:  0.200 m    AORTA  Ao Root diam: 3.10 cm  Ao Asc diam: 3.70 cm   MITRAL VALVE        TRICUSPID VALVE  MV Area (PHT): 3.50 cm  TR Peak grad:  27.2 mmHg  MV Decel Time: 217 msec  TR Vmax:    261.00 cm/s  MV E velocity: 66.70 cm/s  MV A velocity: 73.45 cm/s SHUNTS  MV E/A ratio: 0.91    Systemic VTI: 0.20 m               Systemic Diam: 2.00 cm   Antimicrobials:  Anti-infectives (From admission, onward)   None        Subjective: Seen and examined at bedside and states that she is feeling better today and was ambulating the halls with therapy.  No nausea or vomiting and states that it is a little bit easier to take of breath and today.  No other concerns or complaints at this time and feels well otherwise.  Objective: Vitals:   05/14/20 2105 05/15/20 0030 05/15/20 0039 05/15/20 0425  BP: 130/72 (!) 165/94 (!) 135/91 130/88  Pulse: (!) 105 100  (!) 102  Resp: 16 16  16   Temp: 98.3 F (36.8 C) 98.2 F (36.8 C)  98 F (36.7 C)  TempSrc: Oral Oral  Oral  SpO2: 94% 93%  95%  Weight:    130.4 kg  Height:        Intake/Output Summary (Last 24 hours) at 05/15/2020 1148 Last data filed at 05/15/2020 0600 Gross per 24 hour  Intake 672.02 ml  Output --  Net 672.02 ml   Filed Weights   05/12/20 1013 05/13/20 0500 05/15/20 0425  Weight: 127 kg 130.8 kg  130.4 kg   Examination: Physical Exam:  Constitutional: WN/WD super morbidly obese Caucasian female in NAD and appears calm and comfortable Eyes: Lids and conjunctivae normal, sclerae anicteric  ENMT: External Ears, Nose appear normal. Grossly normal hearing. Neck: Appears normal, supple, no cervical masses, normal ROM, no appreciable thyromegaly; No appreciable JVD but difficult to assess due to body habitus Respiratory: Diminished to auscultation bilaterally with mildly coarse breath sounds, no wheezing, rales, rhonchi or crackles. Normal respiratory effort and patient is not tachypenic. No accessory muscle use. Unlabored breathing Cardiovascular: RRR, no murmurs / rubs / gallops. S1 and S2 auscultated. Trace Extremity edema worse on Right compared to the Left  Abdomen: Soft, non-tender, Distended 2/2 body habitus. Bowel sounds positive.  GU: Deferred. Musculoskeletal: No clubbing / cyanosis of digits/nails. No joint deformity upper and lower extremities.  Skin: No rashes, lesions, ulcers on a limited skin evaluation. No induration; Warm and dry.  Neurologic: CN 2-12 grossly intact with no focal deficits. Romberg sign and cerebellar reflexes not assessed.  Psychiatric: Normal judgment and insight. Alert and oriented x 3. Normal mood and appropriate affect.   Data Reviewed: I have personally reviewed following labs and imaging studies  CBC: Recent Labs  Lab 05/12/20 1044 05/13/20 0236 05/14/20 0257 05/15/20 0024  WBC 8.6 7.9 8.6 9.7  NEUTROABS 6.5  --  6.6 7.5  HGB 13.4 12.2 12.3 12.8  HCT 38.8 36.8 36.3 38.4  MCV 84.9 88.2 85.8 87.5  PLT 284 233 238 585   Basic Metabolic Panel: Recent Labs  Lab 05/12/20 1044 05/13/20 0236 05/14/20 0257 05/15/20 0024  NA 133* 138 132* 131*  K 4.1 3.6 3.6 3.3*  CL 102 109 103 103  CO2 23 21* 20* 18*  GLUCOSE 101* 100* 119* 101*  BUN 14 15 9 9   CREATININE 0.83 0.84 0.63 0.74  CALCIUM 9.1 8.6* 8.5* 8.7*  MG  --  2.0 2.0 2.0  PHOS  --   3.8 3.2 2.6   GFR: Estimated Creatinine Clearance: 111.6 mL/min (by C-G formula based on SCr of 0.74 mg/dL). Liver Function Tests: Recent Labs  Lab 05/12/20 1044 05/13/20 0236 05/14/20 0257 05/15/20 0024  AST 13* 13* 13* 14*  ALT 14 12 14 15   ALKPHOS 61 57 55 53  BILITOT 0.3 0.9 0.8 0.6  PROT 7.5 6.7 6.9 7.3  ALBUMIN 3.8 3.6 3.5 3.7   Recent Labs  Lab 05/12/20 1044  LIPASE 28   No results for input(s): AMMONIA in the last 168 hours. Coagulation Profile: Recent Labs  Lab 05/12/20 1044 05/15/20 0024  INR 1.0 1.0   Cardiac Enzymes: No results for input(s): CKTOTAL, CKMB, CKMBINDEX, TROPONINI in the last 168 hours. BNP (last 3 results) No results for input(s): PROBNP in the last 8760 hours. HbA1C: No results for input(s): HGBA1C in the last 72 hours. CBG: Recent Labs  Lab 05/13/20 0745 05/13/20 0900 05/13/20 0958 05/14/20 0749 05/15/20 0750  GLUCAP 98 128* 145* 124* 97   Lipid Profile: No results for input(s): CHOL, HDL, LDLCALC, TRIG, CHOLHDL, LDLDIRECT in the last 72 hours. Thyroid Function Tests: Recent Labs    05/15/20 0024  TSH 5.117*   Anemia Panel: No results for input(s): VITAMINB12, FOLATE, FERRITIN, TIBC, IRON, RETICCTPCT in the last 72 hours. Sepsis Labs: Recent Labs  Lab 05/12/20 1309 05/12/20 1702  LATICACIDVEN 0.7 0.9    Recent Results (from the past 240 hour(s))  Resp Panel by RT-PCR (Flu A&B, Covid) Nasopharyngeal Swab     Status: None   Collection Time: 05/12/20 12:40 PM   Specimen: Nasopharyngeal Swab; Nasopharyngeal(NP) swabs in vial transport medium  Result Value Ref Range Status   SARS Coronavirus 2 by RT PCR NEGATIVE NEGATIVE Final    Comment: (NOTE) SARS-CoV-2 target nucleic acids are NOT DETECTED.  The SARS-CoV-2 RNA is generally detectable in upper respiratory specimens during the acute phase of infection. The lowest concentration of SARS-CoV-2 viral copies this assay can detect is 138 copies/mL. A negative result does  not preclude SARS-Cov-2 infection and should not be used as the sole basis for treatment or other patient management decisions. A negative result may occur with  improper specimen collection/handling, submission of specimen other than nasopharyngeal swab, presence of viral mutation(s) within the areas targeted by this assay, and inadequate number of viral copies(<138 copies/mL). A negative result must be combined with clinical observations, patient history, and epidemiological information. The expected result is Negative.  Fact Sheet for Patients:  EntrepreneurPulse.com.au  Fact Sheet for Healthcare Providers:  IncredibleEmployment.be  This test is no t yet approved or cleared by the [place] FDA and  has been authorized for detection and/or diagnosis of SARS-CoV-2 by FDA under an Emergency Use Authorization (EUA). This EUA will remain  in effect (meaning this test can be used) for the duration of the COVID-19 declaration under Section 564(b)(1) of the Act, 21 U.S.C.section 360bbb-3(b)(1), unless the authorization is terminated  or revoked sooner.       Influenza A by PCR NEGATIVE NEGATIVE Final   Influenza B by PCR NEGATIVE NEGATIVE Final    Comment: (NOTE) The Xpert Xpress SARS-CoV-2/FLU/RSV plus assay is intended as an aid in the diagnosis of influenza from Nasopharyngeal swab specimens and should not be used as a sole basis  for treatment. Nasal washings and aspirates are unacceptable for Xpert Xpress SARS-CoV-2/FLU/RSV testing.  Fact Sheet for Patients: EntrepreneurPulse.com.au  Fact Sheet for Healthcare Providers: IncredibleEmployment.be  This test is not yet approved or cleared by the [place] FDA and has been authorized for detection and/or diagnosis of SARS-CoV-2 by FDA under an Emergency Use Authorization (EUA). This EUA will remain in effect (meaning this test can be used) for the  duration of the COVID-19 declaration under Section 564(b)(1) of the Act, 21 U.S.C. section 360bbb-3(b)(1), unless the authorization is terminated or revoked.  Performed at [facility], [address] [place], [place] [address]   MRSA PCR Screening     Status: None   Collection Time: 05/12/20  4:38 PM   Specimen: Nasal Mucosa; Nasopharyngeal  Result Value Ref Range Status   MRSA by PCR NEGATIVE NEGATIVE Final    Comment:        The GeneXpert MRSA Assay (FDA approved for NASAL specimens only), is one component of a comprehensive MRSA colonization surveillance program. It is not intended to diagnose MRSA infection nor to guide or monitor treatment for MRSA infections. Performed at [facility], [place] [address] [place], [place] [address]   Urine culture     Status: Abnormal   Collection Time: 05/13/20  5:30 AM   Specimen: Urine, Clean Catch  Result Value Ref Range Status   Specimen Description   Final    URINE, CLEAN CATCH Performed at [facility], [place] [address], [place], [place] [address]    Special Requests   Final    NONE Performed at [facility], [place] [address], [place], [place] [address]    Culture MULTIPLE SPECIES PRESENT, SUGGEST RECOLLECTION (A)  Final   Report Status 05/14/2020 FINAL  Final    RN Pressure Injury Documentation:     Estimated body mass index is 52.58 kg/m as calculated from the following:   Height as of this encounter: 5\' 2"  (1.575 m).   Weight as of this encounter: 130.4 kg.  Malnutrition Type:   Malnutrition Characteristics:   Nutrition Interventions:    Radiology Studies: DG CHEST PORT 1 VIEW  Result Date: 05/14/2020 CLINICAL DATA:  Shortness of breath. EXAM: PORTABLE CHEST 1 VIEW COMPARISON:  05/12/2020. FINDINGS: Cardiomegaly. Mild pulmonary venous congestion. Mild atelectasis/infiltrate right mid lung. No pleural effusion or pneumothorax. IMPRESSION: 1.   Cardiomegaly with mild pulmonary venous congestion. 2.  Mild atelectasis/infiltrate right mid lung. Electronically Signed   By: Marcello Moores  Register   On: 05/14/2020 05:26   ECHOCARDIOGRAM COMPLETE  Result Date: 05/13/2020    ECHOCARDIOGRAM REPORT   Patient Name:   Sharon Rivers Date of Exam: 05/13/2020 Medical Rec #:  [id number]     Height:       62.0 in Accession #:    [id number]    Weight:       288.4 lb Date of Birth:  [birth year]    BSA:          2.233 m Patient Age:    43 years      BP:           156/89 mmHg Patient Gender: F             HR:           88 bpm. Exam Location:  Inpatient Procedure: 2D Echo, Cardiac Doppler, Color Doppler and Intracardiac            Opacification Agent Indications:  I26.02 Pulmonary embolus  History:        Patient has prior history of Echocardiogram examinations, most                 recent 04/15/2016. Signs/Symptoms:Chest Pain and Dyspnea; Risk                 Factors:Sleep Apnea and Hypertension. Hypothyroidism. GERD.  Sonographer:    Jonelle Sidle Dance Referring Phys: [id number] Eugenie Filler  Sonographer Comments: Patient is morbidly obese. Image acquisition challenging due to patient body habitus. IMPRESSIONS  1. Left ventricular ejection fraction, by estimation, is 60 to 65%. The left ventricle has normal function. The left ventricle has no regional wall motion abnormalities. There is mild left ventricular hypertrophy. Left ventricular diastolic parameters are consistent with Grade I diastolic dysfunction (impaired relaxation).  2. Right ventricular systolic function is normal. The right ventricular size is normal.  3. The mitral valve is grossly normal. No evidence of mitral valve regurgitation.  4. The aortic valve was not well visualized. Aortic valve regurgitation is not visualized. Conclusion(s)/Recommendation(s): No evidence of right heart strain. FINDINGS  Left Ventricle: Left ventricular ejection fraction, by estimation, is 60 to 65%. The left ventricle has normal function.  The left ventricle has no regional wall motion abnormalities. Definity contrast agent was given IV to delineate the left ventricular  endocardial borders. The left ventricular internal cavity size was normal in size. There is mild left ventricular hypertrophy. Left ventricular diastolic parameters are consistent with Grade I diastolic dysfunction (impaired relaxation). Normal left ventricular filling pressure. Right Ventricle: The right ventricular size is normal. No increase in right ventricular wall thickness. Right ventricular systolic function is normal. Left Atrium: Left atrial size was normal in size. Right Atrium: Right atrial size was normal in size. Pericardium: There is no evidence of pericardial effusion. Mitral Valve: The mitral valve is grossly normal. No evidence of mitral valve regurgitation. Tricuspid Valve: The tricuspid valve is grossly normal. Tricuspid valve regurgitation is trivial. Aortic Valve: The aortic valve was not well visualized. Aortic valve regurgitation is not visualized. Pulmonic Valve: The pulmonic valve was grossly normal. Pulmonic valve regurgitation is not visualized. Aorta: The aortic root was not well visualized. Venous: The inferior vena cava was not well visualized. IAS/Shunts: The interatrial septum was not well visualized.  LEFT VENTRICLE PLAX 2D LVIDd:         4.30 cm  Diastology LVIDs:         2.80 cm  LV e' medial:    7.94 cm/s LV PW:         1.40 cm  LV E/e' medial:  8.4 LV IVS:        1.00 cm  LV e' lateral:   12.40 cm/s LVOT diam:     2.00 cm  LV E/e' lateral: 5.4 LV SV:         63 LV SV Index:   28 LVOT Area:     3.14 cm  RIGHT VENTRICLE             IVC RV Basal diam:  2.40 cm     IVC diam: 2.10 cm RV S prime:     10.70 cm/s LEFT ATRIUM             Index       RIGHT ATRIUM          Index LA diam:        3.30 cm 1.48 cm/m  RA Area:  8.43 cm LA Vol (A2C):   25.3 ml 11.33 ml/m RA Volume:   14.70 ml 6.58 ml/m LA Vol (A4C):   21.0 ml 9.40 ml/m LA Biplane Vol:  24.0 ml 10.75 ml/m  AORTIC VALVE LVOT Vmax:   129.00 cm/s LVOT Vmean:  75.100 cm/s LVOT VTI:    0.200 m  AORTA Ao Root diam: 3.10 cm Ao Asc diam:  3.70 cm MITRAL VALVE               TRICUSPID VALVE MV Area (PHT): 3.50 cm    TR Peak grad:   27.2 mmHg MV Decel Time: 217 msec    TR Vmax:        261.00 cm/s MV E velocity: 66.70 cm/s MV A velocity: 73.45 cm/s  SHUNTS MV E/A ratio:  0.91        Systemic VTI:  0.20 m                            Systemic Diam: 2.00 cm Lyman Bishop MD Electronically signed by Lyman Bishop MD Signature Date/Time: 05/13/2020/12:44:40 PM    Final    Scheduled Meds: . amLODipine  5 mg Oral Daily  . azelastine  1 spray Each Nare BID  . carbamazepine  100 mg Oral BID  . Chlorhexidine Gluconate Cloth  6 each Topical Daily  . levothyroxine  100 mcg Oral Daily  . megestrol  40 mg Oral BID  . montelukast  10 mg Oral QHS  . oxybutynin  5 mg Oral Daily  . pantoprazole  40 mg Oral BID  . potassium chloride  40 mEq Oral BID  . sertraline  100 mg Oral Daily  . sodium bicarbonate  650 mg Oral BID  . sodium chloride flush  3 mL Intravenous Q12H  . sodium chloride flush  3 mL Intravenous Q12H  . traZODone  100 mg Oral QHS  . warfarin  10 mg Oral ONCE-1600  . Warfarin - Pharmacist Dosing Inpatient   Does not apply q1600   Continuous Infusions: . sodium chloride    . heparin 2,450 Units/hr (05/15/20 0738)    LOS: 3 days   Kerney Elbe, DO [facility] PAGER is on [place]  If 7PM-7AM, please contact night-coverage [email or web address]

## 2020-05-16 LAB — COMPREHENSIVE METABOLIC PANEL
ALT: 18 U/L (ref 0–44)
AST: 25 U/L (ref 15–41)
Albumin: 3.6 g/dL (ref 3.5–5.0)
Alkaline Phosphatase: 49 U/L (ref 38–126)
Anion gap: 9 (ref 5–15)
BUN: 12 mg/dL (ref 6–20)
CO2: 20 mmol/L — ABNORMAL LOW (ref 22–32)
Calcium: 8.6 mg/dL — ABNORMAL LOW (ref 8.9–10.3)
Chloride: 106 mmol/L (ref 98–111)
Creatinine, Ser: 0.85 mg/dL (ref 0.44–1.00)
GFR, Estimated: >60 mL/min (ref >60)
Glucose, Bld: 95 mg/dL (ref 70–99)
Potassium: 4.4 mmol/L (ref 3.5–5.1)
Sodium: 135 mmol/L (ref 135–145)
Total Bilirubin: 1 mg/dL (ref 0.3–1.2)
Total Protein: 6.9 g/dL (ref 6.5–8.1)

## 2020-05-16 LAB — CBC WITH DIFFERENTIAL/PLATELET
Abs Immature Granulocytes: 0.14 10*3/uL — ABNORMAL HIGH (ref 0.00–0.07)
Basophils Absolute: 0.1 10*3/uL (ref 0.0–0.1)
Basophils Relative: 1 %
Eosinophils Absolute: 0.3 10*3/uL (ref 0.0–0.5)
Eosinophils Relative: 4 %
HCT: 38.4 % (ref 36.0–46.0)
Hemoglobin: 12.7 g/dL (ref 12.0–15.0)
Immature Granulocytes: 2 %
Lymphocytes Relative: 17 %
Lymphs Abs: 1.1 10*3/uL (ref 0.7–4.0)
MCH: 29.4 pg (ref 26.0–34.0)
MCHC: 33.1 g/dL (ref 30.0–36.0)
MCV: 88.9 fL (ref 80.0–100.0)
Monocytes Absolute: 0.8 10*3/uL (ref 0.1–1.0)
Monocytes Relative: 12 %
Neutro Abs: 4.2 10*3/uL (ref 1.7–7.7)
Neutrophils Relative %: 64 %
Platelets: 298 10*3/uL (ref 150–400)
RBC: 4.32 MIL/uL (ref 3.87–5.11)
RDW: 13.8 % (ref 11.5–15.5)
WBC: 6.6 10*3/uL (ref 4.0–10.5)
nRBC: 0 % (ref 0.0–0.2)

## 2020-05-16 LAB — GLUCOSE, CAPILLARY: Glucose-Capillary: 125 mg/dL — ABNORMAL HIGH (ref 70–99)

## 2020-05-16 LAB — HEPARIN LEVEL (UNFRACTIONATED): Heparin Unfractionated: 0.42 IU/mL (ref 0.30–0.70)

## 2020-05-16 LAB — PHOSPHORUS: Phosphorus: 2.4 mg/dL — ABNORMAL LOW (ref 2.5–4.6)

## 2020-05-16 LAB — HEMOGLOBIN A1C
Hgb A1c MFr Bld: 4.8 % (ref 4.8–5.6)
Mean Plasma Glucose: 91.06 mg/dL

## 2020-05-16 LAB — PROTIME-INR
INR: 1.9 — ABNORMAL HIGH (ref 0.8–1.2)
Prothrombin Time: 21.5 seconds — ABNORMAL HIGH (ref 11.4–15.2)

## 2020-05-16 LAB — MAGNESIUM: Magnesium: 2.1 mg/dL (ref 1.7–2.4)

## 2020-05-16 MED ORDER — WARFARIN SODIUM 2.5 MG PO TABS
2.5000 mg | ORAL_TABLET | Freq: Once | ORAL | Status: AC
  Administered 2020-05-16: 2.5 mg via ORAL
  Filled 2020-05-16: qty 1

## 2020-05-16 MED ORDER — POTASSIUM & SODIUM PHOSPHATES 280-160-250 MG PO PACK
1.0000 | PACK | Freq: Three times a day (TID) | ORAL | Status: AC
  Administered 2020-05-16 (×2): 1 via ORAL
  Filled 2020-05-16 (×2): qty 1

## 2020-05-16 NOTE — Plan of Care (Signed)
  Problem: Consults Goal: Pharmacy Consult for anticoagulation Outcome: Progressing Goal: Skin Care Protocol Initiated - if Braden Score 18 or less Description: If consults are not indicated, leave blank or document N/A Outcome: Progressing   Problem: Discharge Progression Outcomes Goal: Pain controlled with appropriate interventions Outcome: Progressing   Problem: Education: Goal: Knowledge of General Education information will improve Description: Including pain rating scale, medication(s)/side effects and non-pharmacologic comfort measures Outcome: Progressing   Problem: Activity: Goal: Risk for activity intolerance will decrease Outcome: Progressing   Problem: Coping: Goal: Level of anxiety will decrease Outcome: Progressing   Problem: Elimination: Goal: Will not experience complications related to bowel motility Outcome: Progressing Goal: Will not experience complications related to urinary retention Outcome: Progressing   Problem: Pain Managment: Goal: General experience of comfort will improve Outcome: Progressing   Problem: Safety: Goal: Ability to remain free from injury will improve Outcome: Progressing   Problem: Skin Integrity: Goal: Risk for impaired skin integrity will decrease Outcome: Progressing

## 2020-05-16 NOTE — Progress Notes (Signed)
PROGRESS NOTE    Sharon Rivers  EXN:[id number] DOB: 05-10-1970 DOA: 05/12/2020 PCP: [name], NP   Brief Narrative:  The patient is a 50 year old super morbidly obese Caucasian female with a past medical history significant for prior ankle DVT on anticoagulation with Xarelto as well as history of menorrhagia chronically on Megace, hypertension, hypothyroidism, insulin resistance, nonpitting edema in the lower extremities as well as other comorbidities who presented with a chief complaint of right-sided chest pain last few days.  Chest pain has gotten worse on the morning of admission was associated with right scapular pain and was constant in nature.  She also had some shortness of breath but noticed no significant worsening lower extremity edema palpation.  She presented med center [facility] and subsequently transferred to [facility] found to have an acute pulmonary embolus on CTA of the chest with CT evidence of right heart strain consistent with early submassive PE.  There is also small right effusion and known bilateral peripheral areas of groundglass and airspace consolidation compatible with pulmonary infarct.  CT of the abdomen pelvis showed no acute findings.  In the ED she was tachypneic, tachycardic with her blood pressure being stable and she was initiated on IV heparin.  Currently she is getting further work-up and treatment and lower extremity venous duplex does show right lower extremity DVT.  Echocardiogram done and showed no Right Ventricular Dysfunction. PT OT to evaluate once she is at least 24 hours of therapeutic heparin.  Because she is on carbamazepine and it interacts with the apixaban and rivaroxaban, we will change her to Coumadin and ensure that she is therapeutic prior to discharging.  INR was 1.0 yesterday and today was 1.9. Anticipating D/Cing home in the next 24-48 hours if INR is therapeutic.   Assessment & Plan:   Principal Problem:   Acute pulmonary embolism  (HCC) Active Problems:   Morbid obesity ([place])   Hypertension   Hypothyroid   Family history of early CAD   GERD (gastroesophageal reflux disease)   Depression   OSA (obstructive sleep apnea)   Vitamin D deficiency   Insulin resistance   Heartburn   Essential hypertension   Other specified hypothyroidism   Trigeminal neuralgia   Pulmonary embolism and infarction (HCC)  Acute Bilateral Pulmonary Embolism with Associated Right Leg DVT -??  Etiology.   -Patient with no long car rides.  No recent surgeries.  No oral contraceptive use.  Patient however on Megace secondary to menorrhagia. -Patient noted to have an unprovoked DVT several years ago to her ankle and stated was on anticoagulation however cannot quite remember when that was and was on Xarelto then . -CT angio chest which was done concerning for submassive PE with concern for right ventricular strain. -Cardiac enzymes negative with First Troponin I being 9 and repeat being 14. -SpO2: 96 % O2 Flow Rate (L/min): 2 L/min; Was not wearing Supplemental O2 via Center [place] -Check a 2D echo and showed "Left Ventricle: Left ventricular ejection fraction, by estimation, is 60  to 65%. The left ventricle has normal function. The left ventricle has no regional wall motion abnormalities. Definity contrast agent was given IV to delineate the left ventricular endocardial borders. The left ventricular internal cavity size was normal in size. There is mild left ventricular hypertrophy. Left ventricular diastolic parameters are consistent with Grade I diastolic dysfunction  (impaired relaxation). Normal left  ventricular filling pressure.  Right Ventricle: The right ventricular size is normal. No increase in  right ventricular wall  thickness. Right ventricular systolic function is normal." ; BNP was 7.5 -Lower extremity Dopplers showed "Findings consistent with acute deep vein thrombosis involving the right popliteal vein, and right posterior tibial veins."   -PESI Class at Least I due to Age; Will rule out Heart Failure  -Continue IV heparin and could likely transition to oral anticoagulation if patient continues to remain stable in about 48 to 72 hours and was considering changing to Apixaban but given that she is on Carbamazepine will not change to a DOAC given that DOAC's are strong inhibitors of CYP3A4 -**Have changed the patient to Coumadin and have Heparin gtt now; will need Therapeutic INR and last INR was 1.0 yesterday and today was 1.9 -As this is patient's second event will likely need anticoagulation lifetime. -If patient's condition deteriorates will need to formally consult with PCCM for further evaluation and management. -Will need an Ambulatory Home O2 Screen prior to D/C and PT/OT to evaluate recommending home health  Hypertension -Was holding antihypertensive medications but will resume now with Amlodipine 5 mg  -Continue to Monitor BP per Protocol -Last BP was 141/93  Gastroesophageal Reflux Disease -C/w PPI with Pantoprazole 40 mg po BID  Hypothyroidism -Checked TSH  And was 5.117 -Continue home regimen Levothyroxine 100 mcg po Daily -Repeat Thyroid Function Tests in 4-6 Weeks   Depression/Anxiety -Continue Home regimen Sertraline 100 po Daily.   -C/w Alprazolam 0.25 mg po BID as needed Anxiety  Trigeminal Neuralgia -Continue home regimen of Carbamazepine 100 mg po BID for now   Metabolic Acidosis -Mild. Patient's CO2 was 18, AG is 9, and Chloride is 103 yesterday and today his CO2 is 20, AG is 9, Chloride Level is 106 -Was getting NS at 100 mL/hr x 1 day and now stopped; Start po Sodium Bicarbonate 650 mg po BID -Continue to Monitor and Trend -Repeat CMP this AM  Hyponatremia -Patient's Na+ went from 133 -> 138 -> 132 -> 131 -> 135 -Continue to Monitor and Trend; Sodium Bicarbonate supplementation as Above -Repeat CMP in the AM   Hypokalemia -Patient's K+ was 3.3 and improved to 4.4 -Continue to Monitor  and Trend -Repeat CMP in the AM   Hyperglycemia -Patient's Blood Sugar ranging from 95-119; This AM was 95  -Checked Hemoglobin A1c and was 4.8 -Continue to Monitor and Trend Blood Sugars carefully and if necessary will add Sensitive Novolog SSI AC   Super Morbid Obesity -Complicates overall prognosis and care -Estimated body mass index is 52.14 kg/m as calculated from the following:   Height as of this encounter: 5\' 2"  (1.575 m).   Weight as of this encounter: 129.3 kg. -Weight Loss and Dietary Counseling given   DVT prophylaxis: Anticoagulated with Heparin gtt Code Status: FULL CODE  Family Communication: No family present at bedside Disposition Plan: Pending further clinical Workup and D/C once INR is therapeutic   Status is: Inpatient  Remains inpatient appropriate because:Unsafe d/c plan, IV treatments appropriate due to intensity of illness or inability to take PO and Inpatient level of care appropriate due to severity of illness   Dispo: The patient is from: Home              Anticipated d/c is to: Home              Patient currently is not medically stable to d/c.   Difficult to place patient No  Consultants:   None   Procedures:  LE Venous Duplex  Summary:  BILATERAL:  -No evidence of popliteal  cyst, bilaterally.  RIGHT:  - Findings consistent with acute deep vein thrombosis involving the right  popliteal vein, and right posterior tibial veins.  - There is no evidence of superficial venous thrombosis.    LEFT:  - There is no evidence of deep vein thrombosis in the lower extremity.  - There is no evidence of superficial venous thrombosis.    ECHOCARDIOGRAM IMPRESSIONS    1. Left ventricular ejection fraction, by estimation, is 60 to 65%. The  left ventricle has normal function. The left ventricle has no regional  wall motion abnormalities. There is mild left ventricular hypertrophy.  Left ventricular diastolic parameters  are consistent with Grade  I diastolic dysfunction (impaired relaxation).  2. Right ventricular systolic function is normal. The right ventricular  size is normal.  3. The mitral valve is grossly normal. No evidence of mitral valve  regurgitation.  4. The aortic valve was not well visualized. Aortic valve regurgitation  is not visualized.   Conclusion(s)/Recommendation(s): No evidence of right heart strain.   FINDINGS  Left Ventricle: Left ventricular ejection fraction, by estimation, is 60  to 65%. The left ventricle has normal function. The left ventricle has no  regional wall motion abnormalities. Definity contrast agent was given IV  to delineate the left ventricular  endocardial borders. The left ventricular internal cavity size was normal  in size. There is mild left ventricular hypertrophy. Left ventricular  diastolic parameters are consistent with Grade I diastolic dysfunction  (impaired relaxation). Normal left  ventricular filling pressure.   Right Ventricle: The right ventricular size is normal. No increase in  right ventricular wall thickness. Right ventricular systolic function is  normal.   Left Atrium: Left atrial size was normal in size.   Right Atrium: Right atrial size was normal in size.   Pericardium: There is no evidence of pericardial effusion.   Mitral Valve: The mitral valve is grossly normal. No evidence of mitral  valve regurgitation.   Tricuspid Valve: The tricuspid valve is grossly normal. Tricuspid valve  regurgitation is trivial.   Aortic Valve: The aortic valve was not well visualized. Aortic valve  regurgitation is not visualized.   Pulmonic Valve: The pulmonic valve was grossly normal. Pulmonic valve  regurgitation is not visualized.   Aorta: The aortic root was not well visualized.   Venous: The inferior vena cava was not well visualized.   IAS/Shunts: The interatrial septum was not well visualized.     LEFT VENTRICLE  PLAX 2D  LVIDd:     4.30 cm  Diastology  LVIDs:     2.80 cm LV e' medial:  7.94 cm/s  LV PW:     1.40 cm LV E/e' medial: 8.4  LV IVS:    1.00 cm LV e' lateral:  12.40 cm/s  LVOT diam:   2.00 cm LV E/e' lateral: 5.4  LV SV:     63  LV SV Index:  28  LVOT Area:   3.14 cm     RIGHT VENTRICLE       IVC  RV Basal diam: 2.40 cm   IVC diam: 2.10 cm  RV S prime:   10.70 cm/s   LEFT ATRIUM       Index    RIGHT ATRIUM     Index  LA diam:    3.30 cm 1.48 cm/m RA Area:   8.43 cm  LA Vol (A2C):  25.3 ml 11.33 ml/m RA Volume:  14.70 ml 6.58 ml/m  LA Vol (A4C):  21.0 ml 9.40 ml/m  LA Biplane Vol: 24.0 ml 10.75 ml/m  AORTIC VALVE  LVOT Vmax:  129.00 cm/s  LVOT Vmean: 75.100 cm/s  LVOT VTI:  0.200 m    AORTA  Ao Root diam: 3.10 cm  Ao Asc diam: 3.70 cm   MITRAL VALVE        TRICUSPID VALVE  MV Area (PHT): 3.50 cm  TR Peak grad:  27.2 mmHg  MV Decel Time: 217 msec  TR Vmax:    261.00 cm/s  MV E velocity: 66.70 cm/s  MV A velocity: 73.45 cm/s SHUNTS  MV E/A ratio: 0.91    Systemic VTI: 0.20 m               Systemic Diam: 2.00 cm   Antimicrobials:  Anti-infectives (From admission, onward)   None        Subjective: Seen and examined at bedside and states she is feeling okay today but complaining of some right rib pain. Has been ambulating without issues. No nausea or vomiting. No lightheadedness or dizziness. States she is feeling better.   Objective: Vitals:   05/15/20 1303 05/15/20 2138 05/16/20 0500 05/16/20 0515  BP: (!) 133/93 (!) 154/93  (!) 141/93  Pulse: 98 99  91  Resp: 20 18  (!) 21  Temp: 98.2 F (36.8 C) 98.5 F (36.9 C)  98.3 F (36.8 C)  TempSrc: Oral Oral  Oral  SpO2: 98% 96%  96%  Weight:   129.3 kg   Height:        Intake/Output Summary (Last 24 hours) at 05/16/2020 1244 Last data filed at 05/16/2020 0600 Gross per 24 hour  Intake 583.68 ml  Output 1300 ml  Net -716.32  ml   Filed Weights   05/13/20 0500 05/15/20 0425 05/16/20 0500  Weight: 130.8 kg 130.4 kg 129.3 kg   Examination: Physical Exam:  Constitutional: WN/WD super morbidly obese Caucasian female in NAD and appears calm and comfortable Eyes: Lids and conjunctivae normal, sclerae anicteric  ENMT: External Ears, Nose appear normal. Grossly normal hearing..  Neck: Appears normal, supple, no cervical masses, normal ROM, no appreciable thyromegaly; no JVD Respiratory: Diminished to auscultation bilaterally with coarse breath sounds, no wheezing, rales, rhonchi or crackles. Normal respiratory effort and patient is not tachypenic. No accessory muscle use. Unlabored breathing   Cardiovascular: RRR, no murmurs / rubs / gallops. S1 and S2 auscultated. Trace Extremity edema worse on the Right compared to the Left Abdomen: Soft, non-tender, Distended 2/2 to body habitus. Bowel sounds positive.  GU: Deferred. Musculoskeletal: No clubbing / cyanosis of digits/nails. No joint deformity upper and lower extremities. .  Skin: No rashes, lesions, ulcers on a limited skin evaluation. No induration; Warm and dry.  Neurologic: CN 2-12 grossly intact with no focal deficits. Romberg sign and cerebellar reflexes not assessed.  Psychiatric: Normal judgment and insight. Alert and oriented x 3. Normal mood and appropriate affect.   Data Reviewed: I have personally reviewed following labs and imaging studies  CBC: Recent Labs  Lab 05/12/20 1044 05/13/20 0236 05/14/20 0257 05/15/20 0024 05/16/20 0436  WBC 8.6 7.9 8.6 9.7 6.6  NEUTROABS 6.5  --  6.6 7.5 4.2  HGB 13.4 12.2 12.3 12.8 12.7  HCT 38.8 36.8 36.3 38.4 38.4  MCV 84.9 88.2 85.8 87.5 88.9  PLT 284 233 238 259 517   Basic Metabolic Panel: Recent Labs  Lab 05/12/20 1044 05/13/20 0236 05/14/20 0257 05/15/20 0024 05/16/20 0436  NA 133* 138 132* 131* 135  K 4.1 3.6 3.6 3.3* 4.4  CL 102 109 103 103 106  CO2 23 21* 20* 18* 20*  GLUCOSE 101* 100* 119*  101* 95  BUN 14 15 9 9 12   CREATININE 0.83 0.84 0.63 0.74 0.85  CALCIUM 9.1 8.6* 8.5* 8.7* 8.6*  MG  --  2.0 2.0 2.0 2.1  PHOS  --  3.8 3.2 2.6 2.4*   GFR: Estimated Creatinine Clearance: 104.5 mL/min (by C-G formula based on SCr of 0.85 mg/dL). Liver Function Tests: Recent Labs  Lab 05/12/20 1044 05/13/20 0236 05/14/20 0257 05/15/20 0024 05/16/20 0436  AST 13* 13* 13* 14* 25  ALT 14 12 14 15 18   ALKPHOS 61 57 55 53 49  BILITOT 0.3 0.9 0.8 0.6 1.0  PROT 7.5 6.7 6.9 7.3 6.9  ALBUMIN 3.8 3.6 3.5 3.7 3.6   Recent Labs  Lab 05/12/20 1044  LIPASE 28   No results for input(s): AMMONIA in the last 168 hours. Coagulation Profile: Recent Labs  Lab 05/12/20 1044 05/15/20 0024 05/16/20 0436  INR 1.0 1.0 1.9*   Cardiac Enzymes: No results for input(s): CKTOTAL, CKMB, CKMBINDEX, TROPONINI in the last 168 hours. BNP (last 3 results) No results for input(s): PROBNP in the last 8760 hours. HbA1C: Recent Labs    05/16/20 0436  HGBA1C 4.8   CBG: Recent Labs  Lab 05/13/20 0900 05/13/20 0958 05/14/20 0749 05/15/20 0750 05/16/20 0923  GLUCAP 128* 145* 124* 97 125*   Lipid Profile: No results for input(s): CHOL, HDL, LDLCALC, TRIG, CHOLHDL, LDLDIRECT in the last 72 hours. Thyroid Function Tests: Recent Labs    05/15/20 0024  TSH 5.117*   Anemia Panel: No results for input(s): VITAMINB12, FOLATE, FERRITIN, TIBC, IRON, RETICCTPCT in the last 72 hours. Sepsis Labs: Recent Labs  Lab 05/12/20 1309 05/12/20 1702  LATICACIDVEN 0.7 0.9    Recent Results (from the past 240 hour(s))  Resp Panel by RT-PCR (Flu A&B, Covid) Nasopharyngeal Swab     Status: None   Collection Time: 05/12/20 12:40 PM   Specimen: Nasopharyngeal Swab; Nasopharyngeal(NP) swabs in vial transport medium  Result Value Ref Range Status   SARS Coronavirus 2 by RT PCR NEGATIVE NEGATIVE Final    Comment: (NOTE) SARS-CoV-2 target nucleic acids are NOT DETECTED.  The SARS-CoV-2 RNA is generally  detectable in upper respiratory specimens during the acute phase of infection. The lowest concentration of SARS-CoV-2 viral copies this assay can detect is 138 copies/mL. A negative result does not preclude SARS-Cov-2 infection and should not be used as the sole basis for treatment or other patient management decisions. A negative result may occur with  improper specimen collection/handling, submission of specimen other than nasopharyngeal swab, presence of viral mutation(s) within the areas targeted by this assay, and inadequate number of viral copies(<138 copies/mL). A negative result must be combined with clinical observations, patient history, and epidemiological information. The expected result is Negative.  Fact Sheet for Patients:  EntrepreneurPulse.com.au  Fact Sheet for Healthcare Providers:  IncredibleEmployment.be  This test is no t yet approved or cleared by the [place] FDA and  has been authorized for detection and/or diagnosis of SARS-CoV-2 by FDA under an Emergency Use Authorization (EUA). This EUA will remain  in effect (meaning this test can be used) for the duration of the COVID-19 declaration under Section 564(b)(1) of the Act, 21 U.S.C.section 360bbb-3(b)(1), unless the authorization is terminated  or revoked sooner.       Influenza A by PCR NEGATIVE NEGATIVE Final   Influenza  B by PCR NEGATIVE NEGATIVE Final    Comment: (NOTE) The Xpert Xpress SARS-CoV-2/FLU/RSV plus assay is intended as an aid in the diagnosis of influenza from Nasopharyngeal swab specimens and should not be used as a sole basis for treatment. Nasal washings and aspirates are unacceptable for Xpert Xpress SARS-CoV-2/FLU/RSV testing.  Fact Sheet for Patients: EntrepreneurPulse.com.au  Fact Sheet for Healthcare Providers: IncredibleEmployment.be  This test is not yet approved or cleared by the [place] FDA  and has been authorized for detection and/or diagnosis of SARS-CoV-2 by FDA under an Emergency Use Authorization (EUA). This EUA will remain in effect (meaning this test can be used) for the duration of the COVID-19 declaration under Section 564(b)(1) of the Act, 21 U.S.C. section 360bbb-3(b)(1), unless the authorization is terminated or revoked.  Performed at [facility], [address] [place], [place] [address]   MRSA PCR Screening     Status: None   Collection Time: 05/12/20  4:38 PM   Specimen: Nasal Mucosa; Nasopharyngeal  Result Value Ref Range Status   MRSA by PCR NEGATIVE NEGATIVE Final    Comment:        The GeneXpert MRSA Assay (FDA approved for NASAL specimens only), is one component of a comprehensive MRSA colonization surveillance program. It is not intended to diagnose MRSA infection nor to guide or monitor treatment for MRSA infections. Performed at [facility], [place] [address], [place], [place] [address]   Urine culture     Status: Abnormal   Collection Time: 05/13/20  5:30 AM   Specimen: Urine, Clean Catch  Result Value Ref Range Status   Specimen Description   Final    URINE, CLEAN CATCH Performed at [facility], [place] [address], [place], [place] [address]    Special Requests   Final    NONE Performed at [facility], [place] [address] [place],  [address]    Culture MULTIPLE SPECIES PRESENT, SUGGEST RECOLLECTION (A)  Final   Report Status 05/14/2020 FINAL  Final    RN Pressure Injury Documentation:     Estimated body mass index is 52.14 kg/m as calculated from the following:   Height as of this encounter: 5\' 2"  (1.575 m).   Weight as of this encounter: 129.3 kg.  Malnutrition Type:   Malnutrition Characteristics:   Nutrition Interventions:    Radiology Studies: No results found. Scheduled Meds: . amLODipine  5 mg Oral Daily  . azelastine  1 spray Each Nare BID   . carbamazepine  100 mg Oral BID  . levothyroxine  100 mcg Oral Daily  . megestrol  40 mg Oral BID  . montelukast  10 mg Oral QHS  . oxybutynin  5 mg Oral Daily  . pantoprazole  40 mg Oral BID  . potassium & sodium phosphates  1 packet Oral TID WC & HS  . sertraline  100 mg Oral Daily  . sodium bicarbonate  650 mg Oral BID  . sodium chloride flush  3 mL Intravenous Q12H  . sodium chloride flush  3 mL Intravenous Q12H  . traZODone  100 mg Oral QHS  . Warfarin - Pharmacist Dosing Inpatient   Does not apply q1600   Continuous Infusions: . sodium chloride    . heparin 2,450 Units/hr (05/16/20 0513)    LOS: 4 days   Kerney Elbe, DO [facility] PAGER is on [place]  If 7PM-7AM, please contact night-coverage [email or web address]

## 2020-05-16 NOTE — Progress Notes (Signed)
ANTICOAGULATION CONSULT NOTE - Follow Up Consult  Pharmacy Consult for heparin/warfarin Indication: acute pulmonary embolus  Allergies  Allergen Reactions  . Adhesive [Tape] Other (See Comments)    Skin Burn.  [name] [Menthol (Topical Analgesic)] Other (See Comments)    Skin Burn.  . Sulfa Antibiotics Rash   Patient Measurements: Height: 5\' 2"  (157.5 cm) Weight: 129.3 kg (285 lb 0.9 oz) IBW/kg (Calculated) : 50.1 Heparin Dosing Weight: 83 kg BMI = 52  Vital Signs: Temp: 98.3 F (36.8 C) (04/09 0515) Temp Source: Oral (04/09 0515) BP: 141/93 (04/09 0515) Pulse Rate: 91 (04/09 0515)  Labs: Recent Labs    05/14/20 0257 05/14/20 1009 05/15/20 0024 05/15/20 0524 05/15/20 1107 05/16/20 0436  HGB 12.3  --  12.8  --   --  12.7  HCT 36.3  --  38.4  --   --  38.4  PLT 238  --  259  --   --  298  LABPROT  --   --  13.0  --   --  21.5*  INR  --   --  1.0  --   --  1.9*  HEPARINUNFRC 0.17*   < >  --  0.40 0.42 0.42  CREATININE 0.63  --  0.74  --   --  0.85   < > = values in this interval not displayed.   Estimated Creatinine Clearance: 104.5 mL/min (by C-G formula based on SCr of 0.85 mg/dL).  Assessment: Patient is a 50 y.o F with hx DVT in 2018 presented to [facility] ED with c/o SOB and CP. Chest CT showed bilateral PE with evidence of right heart strain consistent with at least submassive PE. Pharmacy consulted to dose/monitor heparin drip and warfarin.   Patient is not a candidate for DOAC due to drug interaction between DOAC and carbamazepine. Warfarin initiated on 4/7, currently remains on IV UFH for bridge.   Today, 05/16/2020:  INR = 1.9 is slightly subtherapeutic. Significant increase in INR in past 24 hours. Atypical to see such large increase in INR so soon after initiating warfarin.   HL = 0.42 remains therapeutic on heparin infusion of 2450 units/hr  Confirmed with RN that heparin infusing at correct rate. No signs of bleeding.  CBC: Stable, WNL  SCr:   Stable, WNL  Diet: Heart healthy; meal intake not charted  Drug interactions:   Carbamazepine is a CYP 1A2, 3A4, and 2C9 inducer; may decrease concentration of warfarin. Patients may require higher doses of warfarin to overcome DDI and achieve therapeutic INR.  Goal of Therapy:  INR 2-3 Heparin level 0.3-0.7 units/ml Monitor platelets by anticoagulation protocol: Yes   Plan:   Continue heparin infusion at current rate of 2450 units/hr  Warfarin 2.5 mg x1 this afternoon  INR, HL daily. CBC daily while on heparin infusion  Monitor for signs/symptoms of bleeding  During initiation of warfarin, patient will require anticoagulation bridging with either LMWH or IV UFH for a minimum duration of 5 days AND until pt has 2 days consecutive INR levels >2.  Lenis Noon, PharmD 05/16/2020, 1:57 PM

## 2020-05-17 LAB — PHOSPHORUS: Phosphorus: 3.6 mg/dL (ref 2.5–4.6)

## 2020-05-17 LAB — COMPREHENSIVE METABOLIC PANEL
ALT: 18 U/L (ref 0–44)
AST: 17 U/L (ref 15–41)
Albumin: 3.8 g/dL (ref 3.5–5.0)
Alkaline Phosphatase: 53 U/L (ref 38–126)
Anion gap: 11 (ref 5–15)
BUN: 13 mg/dL (ref 6–20)
CO2: 22 mmol/L (ref 22–32)
Calcium: 8.8 mg/dL — ABNORMAL LOW (ref 8.9–10.3)
Chloride: 102 mmol/L (ref 98–111)
Creatinine, Ser: 0.83 mg/dL (ref 0.44–1.00)
GFR, Estimated: >60 mL/min (ref >60)
Glucose, Bld: 97 mg/dL (ref 70–99)
Potassium: 3.8 mmol/L (ref 3.5–5.1)
Sodium: 135 mmol/L (ref 135–145)
Total Bilirubin: 0.5 mg/dL (ref 0.3–1.2)
Total Protein: 7.5 g/dL (ref 6.5–8.1)

## 2020-05-17 LAB — CBC WITH DIFFERENTIAL/PLATELET
Abs Immature Granulocytes: 0.14 10*3/uL — ABNORMAL HIGH (ref 0.00–0.07)
Basophils Absolute: 0.1 10*3/uL (ref 0.0–0.1)
Basophils Relative: 1 %
Eosinophils Absolute: 0.4 10*3/uL (ref 0.0–0.5)
Eosinophils Relative: 5 %
HCT: 40.6 % (ref 36.0–46.0)
Hemoglobin: 13.4 g/dL (ref 12.0–15.0)
Immature Granulocytes: 2 %
Lymphocytes Relative: 17 %
Lymphs Abs: 1.3 10*3/uL (ref 0.7–4.0)
MCH: 28.9 pg (ref 26.0–34.0)
MCHC: 33 g/dL (ref 30.0–36.0)
MCV: 87.5 fL (ref 80.0–100.0)
Monocytes Absolute: 0.7 10*3/uL (ref 0.1–1.0)
Monocytes Relative: 9 %
Neutro Abs: 4.9 10*3/uL (ref 1.7–7.7)
Neutrophils Relative %: 66 %
Platelets: 302 10*3/uL (ref 150–400)
RBC: 4.64 MIL/uL (ref 3.87–5.11)
RDW: 13.7 % (ref 11.5–15.5)
WBC: 7.4 10*3/uL (ref 4.0–10.5)
nRBC: 0 % (ref 0.0–0.2)

## 2020-05-17 LAB — PROTIME-INR
INR: 2.1 — ABNORMAL HIGH (ref 0.8–1.2)
Prothrombin Time: 22.5 seconds — ABNORMAL HIGH (ref 11.4–15.2)

## 2020-05-17 LAB — MAGNESIUM: Magnesium: 2.1 mg/dL (ref 1.7–2.4)

## 2020-05-17 LAB — GLUCOSE, CAPILLARY: Glucose-Capillary: 98 mg/dL (ref 70–99)

## 2020-05-17 LAB — HEPARIN LEVEL (UNFRACTIONATED): Heparin Unfractionated: 0.51 IU/mL (ref 0.30–0.70)

## 2020-05-17 MED ORDER — SENNOSIDES-DOCUSATE SODIUM 8.6-50 MG PO TABS: 1.0000 | ORAL_TABLET | Freq: Every evening | ORAL | 0 refills | Status: DC | PRN

## 2020-05-17 MED ORDER — ONDANSETRON HCL 4 MG PO TABS: 4.0000 mg | ORAL_TABLET | Freq: Four times a day (QID) | ORAL | 0 refills | Status: DC | PRN

## 2020-05-17 MED ORDER — ACETAMINOPHEN 325 MG PO TABS: 650.0000 mg | ORAL_TABLET | Freq: Four times a day (QID) | ORAL | 0 refills | Status: AC | PRN

## 2020-05-17 MED ORDER — COUMADIN BOOK
Freq: Once | Status: AC
  Filled 2020-05-17: qty 1

## 2020-05-17 MED ORDER — ENOXAPARIN SODIUM 120 MG/0.8ML ~~LOC~~ SOLN: 120.0000 mg | Freq: Two times a day (BID) | SUBCUTANEOUS | 0 refills | Status: DC

## 2020-05-17 MED ORDER — WARFARIN SODIUM 5 MG PO TABS
7.5000 mg | ORAL_TABLET | ORAL | Status: AC
  Administered 2020-05-17: 7.5 mg via ORAL
  Filled 2020-05-17: qty 1

## 2020-05-17 MED ORDER — WARFARIN SODIUM 5 MG PO TABS: 7.5000 mg | ORAL_TABLET | Freq: Every day | ORAL | 0 refills | Status: DC

## 2020-05-17 NOTE — Discharge Instructions (Signed)

## 2020-05-17 NOTE — Progress Notes (Signed)
ANTICOAGULATION CONSULT NOTE - Follow Up Consult  Pharmacy Consult for heparin/warfarin Indication: acute pulmonary embolus  Allergies  Allergen Reactions  . Adhesive [Tape] Other (See Comments)    Skin Burn.  [name] [Menthol (Topical Analgesic)] Other (See Comments)    Skin Burn.  . Sulfa Antibiotics Rash   Patient Measurements: Height: 5\' 2"  (157.5 cm) Weight: 129.2 kg (284 lb 13.4 oz) IBW/kg (Calculated) : 50.1 Heparin Dosing Weight: 83 kg BMI = 52  Vital Signs: Temp: 98.7 F (37.1 C) (04/10 1236) Temp Source: Oral (04/10 1236) BP: 167/108 (04/10 1236) Pulse Rate: 87 (04/10 1236)  Labs: Recent Labs    05/15/20 0024 05/15/20 0524 05/15/20 1107 05/16/20 0436 05/17/20 0516  HGB 12.8  --   --  12.7 13.4  HCT 38.4  --   --  38.4 40.6  PLT 259  --   --  298 302  LABPROT 13.0  --   --  21.5* 22.5*  INR 1.0  --   --  1.9* 2.1*  HEPARINUNFRC  --    < > 0.42 0.42 0.51  CREATININE 0.74  --   --  0.85 0.83   < > = values in this interval not displayed.   Estimated Creatinine Clearance: 106.9 mL/min (by C-G formula based on SCr of 0.83 mg/dL).  Assessment: Patient is a 50 y.o F with hx DVT in 2018 presented to [facility] ED with c/o SOB and CP. Chest CT showed bilateral PE with evidence of right heart strain consistent with at least submassive PE. Pharmacy consulted to dose/monitor heparin drip and warfarin.   Patient is not a candidate for DOAC due to drug interaction between DOAC and carbamazepine. Warfarin initiated on 4/7, currently remains on IV UFH for bridge.   Today, 05/17/2020:  INR = 2.1 is therapeutic today  HL = 0.51 remains therapeutic on heparin infusion of 2450 units/hr  Confirmed with RN that heparin infusing at correct rate. No signs of bleeding.  CBC: Stable, WNL  SCr:  Stable, WNL  Diet: Heart healthy; 100% meal intake charted  Drug interactions:   Carbamazepine is a CYP 1A2, 3A4, and 2C9 inducer; may decrease concentration of warfarin.  Patients may require higher doses of warfarin to overcome DDI and achieve therapeutic INR.  Goal of Therapy:  INR 2-3 Heparin level 0.3-0.7 units/ml Monitor platelets by anticoagulation protocol: Yes   Plan:   Continue heparin infusion at current rate of 2450 units/hr  Warfarin 7.5 mg x1 this afternoon  INR, HL daily. CBC daily while on heparin infusion  Monitor for signs/symptoms of bleeding  Patient provided with warfarin education booklet and medication counseling on 4/10.  During initiation of warfarin, patient will require anticoagulation bridging with either LMWH or IV UFH for a minimum duration of 5 days AND until pt has 2 days consecutive INR levels >2.  At this time, would recommend discharging on warfarin 7.5 mg PO daily with INR check within 48 hours of hospital discharge to guide further dosing.   Lenis Noon, PharmD 05/17/2020, 1:55 PM

## 2020-05-17 NOTE — TOC Progression Note (Addendum)
Transition of Care [facility]) - Progression Note    Patient Details  Name: Sharon Rivers MRN: [id number] Date of Birth: 11-23-1970  Transition [facility] CM/SW Contact  Joaquin Courts, RN Phone Number: 05/17/2020, 12:19 PM  Clinical Narrative:    CM spoke with patient and MD regarding dc planning.  Patient to dc home with coumadin bridge and needs INR check Monday or Tuesday.  Unfortunately clinics are closed today (Sunday) and CM is unable to schedule this appointment, Md made aware of this and elects to keep patient overnight until an appointment is set.  Patient was informed of this barrier and plan. CM called and spoke with answering service at pcp clinic, message sent to clinic with request for appointment, clinic expected to call CM back on Monday after 8 am to assist with scheduling.    Expected Discharge Plan: Home/Self Care Barriers to Discharge: Other (comment) ([place] to schedule INR check appointment due to clinics closed for the weekend)  Expected Discharge Plan and Services Expected Discharge Plan: Home/Self Care   Discharge Planning Services: CM Consult   Living arrangements for the past 2 months: Single Family Home Expected Discharge Date: 05/17/20                                     Social Determinants of Health (SDOH) Interventions    Readmission Risk Interventions No flowsheet data found.

## 2020-05-17 NOTE — Plan of Care (Signed)
  Problem: Discharge Progression Outcomes Goal: Complications resolved/controlled Outcome: Progressing   Problem: Discharge Progression Outcomes Goal: Hemodynamically stable Outcome: Progressing   Problem: Education: Goal: Knowledge of General Education information will improve Description: Including pain rating scale, medication(s)/side effects and non-pharmacologic comfort measures Outcome: Progressing   Problem: Clinical Measurements: Goal: Diagnostic test results will improve Outcome: Progressing

## 2020-05-17 NOTE — Progress Notes (Signed)
PROGRESS NOTE    Sharon Rivers  LZJ:[id number] DOB: 1970/12/11 DOA: 05/12/2020 PCP: [name], NP   Brief Narrative:  The patient is a 50 year old super morbidly obese Caucasian female with a past medical history significant for prior ankle DVT on anticoagulation with Xarelto as well as history of menorrhagia chronically on Megace, hypertension, hypothyroidism, insulin resistance, nonpitting edema in the lower extremities as well as other comorbidities who presented with a chief complaint of right-sided chest pain last few days.  Chest pain has gotten worse on the morning of admission was associated with right scapular pain and was constant in nature.  She also had some shortness of breath but noticed no significant worsening lower extremity edema palpation.  She presented med center [facility] and subsequently transferred to [facility] found to have an acute pulmonary embolus on CTA of the chest with CT evidence of right heart strain consistent with early submassive PE.  There is also small right effusion and known bilateral peripheral areas of groundglass and airspace consolidation compatible with pulmonary infarct.  CT of the abdomen pelvis showed no acute findings.  In the ED she was tachypneic, tachycardic with her blood pressure being stable and she was initiated on IV heparin.  Currently she is getting further work-up and treatment and lower extremity venous duplex does show right lower extremity DVT.  Echocardiogram done and showed no Right Ventricular Dysfunction. PT OT to evaluate once she is at least 24 hours of therapeutic heparin.  Because she is on carbamazepine and it interacts with the apixaban and rivaroxaban, we will change her to Coumadin and ensure that she is therapeutic prior to discharging.  INR was 1.0 the day before yesterday yesterday was 1.9 and today was 21. Was going to D/C the patient home today with 7.5 mg of po Coumadin and Lovenox bridge but unfortunately cannot get  a follow up for her with a Coumadin Clinic so Discharge was cancelled. Anticipating D/Cing home in the next 24 hours once INR is therapeutic.   Assessment & Plan:   Principal Problem:   Acute pulmonary embolism (HCC) Active Problems:   Morbid obesity ([place])   Hypertension   Hypothyroid   Family history of early CAD   GERD (gastroesophageal reflux disease)   Depression   OSA (obstructive sleep apnea)   Vitamin D deficiency   Insulin resistance   Heartburn   Essential hypertension   Other specified hypothyroidism   Trigeminal neuralgia   Pulmonary embolism and infarction (HCC)  Acute Bilateral Pulmonary Embolism with Associated Right Leg DVT -??  Etiology.   -Patient with no long car rides.  No recent surgeries.  No oral contraceptive use.  Patient however on Megace secondary to menorrhagia. -Patient noted to have an unprovoked DVT several years ago to her ankle and stated was on anticoagulation however cannot quite remember when that was and was on Xarelto then . -CT angio chest which was done concerning for submassive PE with concern for right ventricular strain. -Cardiac enzymes negative with First Troponin I being 9 and repeat being 14. -SpO2: 96 % O2 Flow Rate (L/min): 2 L/min; Was not wearing Supplemental O2 via [place] -Check a 2D echo and showed "Left Ventricle: Left ventricular ejection fraction, by estimation, is 60  to 65%. The left ventricle has normal function. The left ventricle has no regional wall motion abnormalities. Definity contrast agent was given IV to delineate the left ventricular endocardial borders. The left ventricular internal cavity size was normal in size. There is  mild left ventricular hypertrophy. Left ventricular diastolic parameters are consistent with Grade I diastolic dysfunction  (impaired relaxation). Normal left  ventricular filling pressure.  Right Ventricle: The right ventricular size is normal. No increase in  right ventricular wall thickness. Right  ventricular systolic function is normal." ; BNP was 7.5 -Lower extremity Dopplers showed "Findings consistent with acute deep vein thrombosis involving the right popliteal vein, and right posterior tibial veins."  -PESI Class at Least I due to Age; Will rule out Heart Failure  -Continue IV heparin and could likely transition to oral anticoagulation if patient continues to remain stable in about 48 to 72 hours and was considering changing to Apixaban but given that she is on Carbamazepine will not change to a DOAC given that DOAC's are strong inhibitors of CYP3A4 -**Have changed the patient to Coumadin and have Heparin gtt now; will need Therapeutic INR and last INR was 1.0 the day before yesterday and yesterday was 1.9; Today was 2.1; was going to send the patient home with Lovenox bridge for next 2 days as well as 7.5 mg of Coumadin daily with a repeat INR check on 05/19/2020 however unfortunately she is unable to get a appointment at the Coumadin clinic so we have held her discharge for today until adequate follow-up is ensured -As this is patient's second event will likely need anticoagulation lifetime. -If patient's condition deteriorates will need to formally consult with PCCM for further evaluation and management. -Will need an Ambulatory Home O2 Screen prior to D/C and PT/OT to evaluate recommending home health  Hypertension -Was holding antihypertensive medications but will resume now with Amlodipine 5 mg  -Continue to Monitor BP per Protocol -Last BP was elevated at 167/108  Gastroesophageal Reflux Disease -C/w PPI with Pantoprazole 40 mg po BID  Hypothyroidism -Checked TSH and was 5.117 -Continue home regimen Levothyroxine 100 mcg po Daily -Repeat Thyroid Function Tests in 4-6 Weeks   Depression/Anxiety -Continue Home regimen Sertraline 100 po Daily.   -C/w Alprazolam 0.25 mg po BID as needed Anxiety  Trigeminal Neuralgia -Continue home regimen of Carbamazepine 100 mg po BID  for now   Metabolic Acidosis -Mild and improved. Patient's CO2 is 22, AG is 11, and Chloride Level is 102 -Was getting NS at 100 mL/hr x 1 day and now stopped; Start po Sodium Bicarbonate 650 mg po BID -Continue to Monitor and Trend -Repeat CMP this AM  Hyponatremia -Patient's Na+ went from 133 -> 138 -> 132 -> 131 -> 135 -> 135 again  -Continue to Monitor and Trend; Sodium Bicarbonate supplementation as Above -Repeat CMP in the AM   Hypokalemia -Patient's K+ was 3.3 and improved to 4.4 yesterday and was 3.8 today  -Continue to Monitor and Trend -Repeat CMP in the AM   Hyperglycemia -Patient's Blood Sugar ranging from 95-119; This AM was 97 -Checked Hemoglobin A1c and was 4.8 -Continue to Monitor and Trend Blood Sugars carefully and if necessary will add Sensitive Novolog SSI AC   Super Morbid Obesity -Complicates overall prognosis and care -Estimated body mass index is 52.1 kg/m as calculated from the following:   Height as of this encounter: 5\' 2"  (1.575 m).   Weight as of this encounter: 129.2 kg. -Weight Loss and Dietary Counseling given   DVT prophylaxis: Anticoagulated with Heparin gtt Code Status: FULL CODE  Family Communication: No family present at bedside Disposition Plan: Patient was to be discharged today now that her INR is therapeutic and she is to be sent with a Lovenox  bridge for 2 days as today is day 4 of 5 of overlap of the Coumadin bridge but she does not have adequate follow-up for a Coumadin clinic for INR check in 3 days so we will hold her discharge today and ensure that she does have follow-up in the morning  Status is: Inpatient  Remains inpatient appropriate because:Unsafe d/c plan, IV treatments appropriate due to intensity of illness or inability to take PO and Inpatient level of care appropriate due to severity of illness   Dispo: The patient is from: Home              Anticipated d/c is to: Home              Patient currently is not medically  stable to d/c.   Difficult to place patient No  Consultants:   None   Procedures:  LE Venous Duplex  Summary:  BILATERAL:  -No evidence of popliteal cyst, bilaterally.  RIGHT:  - Findings consistent with acute deep vein thrombosis involving the right  popliteal vein, and right posterior tibial veins.  - There is no evidence of superficial venous thrombosis.    LEFT:  - There is no evidence of deep vein thrombosis in the lower extremity.  - There is no evidence of superficial venous thrombosis.    ECHOCARDIOGRAM IMPRESSIONS    1. Left ventricular ejection fraction, by estimation, is 60 to 65%. The  left ventricle has normal function. The left ventricle has no regional  wall motion abnormalities. There is mild left ventricular hypertrophy.  Left ventricular diastolic parameters  are consistent with Grade I diastolic dysfunction (impaired relaxation).  2. Right ventricular systolic function is normal. The right ventricular  size is normal.  3. The mitral valve is grossly normal. No evidence of mitral valve  regurgitation.  4. The aortic valve was not well visualized. Aortic valve regurgitation  is not visualized.   Conclusion(s)/Recommendation(s): No evidence of right heart strain.   FINDINGS  Left Ventricle: Left ventricular ejection fraction, by estimation, is 60  to 65%. The left ventricle has normal function. The left ventricle has no  regional wall motion abnormalities. Definity contrast agent was given IV  to delineate the left ventricular  endocardial borders. The left ventricular internal cavity size was normal  in size. There is mild left ventricular hypertrophy. Left ventricular  diastolic parameters are consistent with Grade I diastolic dysfunction  (impaired relaxation). Normal left  ventricular filling pressure.   Right Ventricle: The right ventricular size is normal. No increase in  right ventricular wall thickness. Right ventricular systolic  function is  normal.   Left Atrium: Left atrial size was normal in size.   Right Atrium: Right atrial size was normal in size.   Pericardium: There is no evidence of pericardial effusion.   Mitral Valve: The mitral valve is grossly normal. No evidence of mitral  valve regurgitation.   Tricuspid Valve: The tricuspid valve is grossly normal. Tricuspid valve  regurgitation is trivial.   Aortic Valve: The aortic valve was not well visualized. Aortic valve  regurgitation is not visualized.   Pulmonic Valve: The pulmonic valve was grossly normal. Pulmonic valve  regurgitation is not visualized.   Aorta: The aortic root was not well visualized.   Venous: The inferior vena cava was not well visualized.   IAS/Shunts: The interatrial septum was not well visualized.     LEFT VENTRICLE  PLAX 2D  LVIDd:     4.30 cm Diastology  LVIDs:  2.80 cm LV e' medial:  7.94 cm/s  LV PW:     1.40 cm LV E/e' medial: 8.4  LV IVS:    1.00 cm LV e' lateral:  12.40 cm/s  LVOT diam:   2.00 cm LV E/e' lateral: 5.4  LV SV:     63  LV SV Index:  28  LVOT Area:   3.14 cm     RIGHT VENTRICLE       IVC  RV Basal diam: 2.40 cm   IVC diam: 2.10 cm  RV S prime:   10.70 cm/s   LEFT ATRIUM       Index    RIGHT ATRIUM     Index  LA diam:    3.30 cm 1.48 cm/m RA Area:   8.43 cm  LA Vol (A2C):  25.3 ml 11.33 ml/m RA Volume:  14.70 ml 6.58 ml/m  LA Vol (A4C):  21.0 ml 9.40 ml/m  LA Biplane Vol: 24.0 ml 10.75 ml/m  AORTIC VALVE  LVOT Vmax:  129.00 cm/s  LVOT Vmean: 75.100 cm/s  LVOT VTI:  0.200 m    AORTA  Ao Root diam: 3.10 cm  Ao Asc diam: 3.70 cm   MITRAL VALVE        TRICUSPID VALVE  MV Area (PHT): 3.50 cm  TR Peak grad:  27.2 mmHg  MV Decel Time: 217 msec  TR Vmax:    261.00 cm/s  MV E velocity: 66.70 cm/s  MV A velocity: 73.45 cm/s SHUNTS  MV E/A ratio: 0.91    Systemic VTI: 0.20 m                Systemic Diam: 2.00 cm   Antimicrobials:  Anti-infectives (From admission, onward)   None        Subjective: Seen and examined at bedside and she feels better today.  No chest pain or shortness of breath.  No lightheadedness or dizziness.  Denies any other concerns or complaints at this time and was to be discharged today but given that we are unable to schedule her for follow-up for the Coumadin clinic we will hold her discharge today and discharge her home in the morning.  Objective: Vitals:   05/16/20 2137 05/17/20 0500 05/17/20 0610 05/17/20 1236  BP: (!) 146/94  (!) 138/94 (!) 167/108  Pulse: 89  81 87  Resp: (!) 21  20 16   Temp: 98 F (36.7 C)  98 F (36.7 C) 98.7 F (37.1 C)  TempSrc: Oral  Oral Oral  SpO2: 94%  95% 96%  Weight:  129.2 kg    Height:        Intake/Output Summary (Last 24 hours) at 05/17/2020 1336 Last data filed at 05/17/2020 1000 Gross per 24 hour  Intake 695.56 ml  Output 1350 ml  Net -654.44 ml   Filed Weights   05/15/20 0425 05/16/20 0500 05/17/20 0500  Weight: 130.4 kg 129.3 kg 129.2 kg   Examination: Physical Exam:  Constitutional: WN/WD super morbidly obese Caucasian female in NAD appears calm and comfortable Eyes: Lids and conjunctivae normal, sclerae anicteric  ENMT: External Ears, Nose appear normal. Grossly normal hearing.  Neck: Appears normal, supple, no cervical masses, normal ROM, no appreciable thyromegaly neck: No JVD Respiratory: Diminished to auscultation bilaterally, no wheezing, rales, rhonchi or crackles. Normal respiratory effort and patient is not tachypenic. No accessory muscle use.  Unlabored breathing and not wearing supplemental oxygen nasal cannula Cardiovascular: RRR, no murmurs / rubs / gallops. S1 and S2  auscultated.  Trace extremity edema worse on right compared to left Abdomen: Soft, non-tender, distended secondary by habitus. Bowel sounds positive.  GU: Deferred. Musculoskeletal: No  clubbing / cyanosis of digits/nails. No joint deformity upper and lower extremities.  Skin: No rashes, lesions, ulcers on limited skin evaluation. No induration; Warm and dry.  Neurologic: CN 2-12 grossly intact with no focal deficits. Romberg sign and cerebellar reflexes not assessed.  Psychiatric: Normal judgment and insight. Alert and oriented x 3. Normal mood and appropriate affect.   Data Reviewed: I have personally reviewed following labs and imaging studies  CBC: Recent Labs  Lab 05/12/20 1044 05/13/20 0236 05/14/20 0257 05/15/20 0024 05/16/20 0436 05/17/20 0516  WBC 8.6 7.9 8.6 9.7 6.6 7.4  NEUTROABS 6.5  --  6.6 7.5 4.2 4.9  HGB 13.4 12.2 12.3 12.8 12.7 13.4  HCT 38.8 36.8 36.3 38.4 38.4 40.6  MCV 84.9 88.2 85.8 87.5 88.9 87.5  PLT 284 233 238 259 298 532   Basic Metabolic Panel: Recent Labs  Lab 05/13/20 0236 05/14/20 0257 05/15/20 0024 05/16/20 0436 05/17/20 0516  NA 138 132* 131* 135 135  K 3.6 3.6 3.3* 4.4 3.8  CL 109 103 103 106 102  CO2 21* 20* 18* 20* 22  GLUCOSE 100* 119* 101* 95 97  BUN 15 9 9 12 13   CREATININE 0.84 0.63 0.74 0.85 0.83  CALCIUM 8.6* 8.5* 8.7* 8.6* 8.8*  MG 2.0 2.0 2.0 2.1 2.1  PHOS 3.8 3.2 2.6 2.4* 3.6   GFR: Estimated Creatinine Clearance: 106.9 mL/min (by C-G formula based on SCr of 0.83 mg/dL). Liver Function Tests: Recent Labs  Lab 05/13/20 0236 05/14/20 0257 05/15/20 0024 05/16/20 0436 05/17/20 0516  AST 13* 13* 14* 25 17  ALT 12 14 15 18 18   ALKPHOS 57 55 53 49 53  BILITOT 0.9 0.8 0.6 1.0 0.5  PROT 6.7 6.9 7.3 6.9 7.5  ALBUMIN 3.6 3.5 3.7 3.6 3.8   Recent Labs  Lab 05/12/20 1044  LIPASE 28   No results for input(s): AMMONIA in the last 168 hours. Coagulation Profile: Recent Labs  Lab 05/12/20 1044 05/15/20 0024 05/16/20 0436 05/17/20 0516  INR 1.0 1.0 1.9* 2.1*   Cardiac Enzymes: No results for input(s): CKTOTAL, CKMB, CKMBINDEX, TROPONINI in the last 168 hours. BNP (last 3 results) No results for  input(s): PROBNP in the last 8760 hours. HbA1C: Recent Labs    05/16/20 0436  HGBA1C 4.8   CBG: Recent Labs  Lab 05/13/20 0958 05/14/20 0749 05/15/20 0750 05/16/20 0923 05/17/20 0735  GLUCAP 145* 124* 97 125* 98   Lipid Profile: No results for input(s): CHOL, HDL, LDLCALC, TRIG, CHOLHDL, LDLDIRECT in the last 72 hours. Thyroid Function Tests: Recent Labs    05/15/20 0024  TSH 5.117*   Anemia Panel: No results for input(s): VITAMINB12, FOLATE, FERRITIN, TIBC, IRON, RETICCTPCT in the last 72 hours. Sepsis Labs: Recent Labs  Lab 05/12/20 1309 05/12/20 1702  LATICACIDVEN 0.7 0.9    Recent Results (from the past 240 hour(s))  Resp Panel by RT-PCR (Flu A&B, Covid) Nasopharyngeal Swab     Status: None   Collection Time: 05/12/20 12:40 PM   Specimen: Nasopharyngeal Swab; Nasopharyngeal(NP) swabs in vial transport medium  Result Value Ref Range Status   SARS Coronavirus 2 by RT PCR NEGATIVE NEGATIVE Final    Comment: (NOTE) SARS-CoV-2 target nucleic acids are NOT DETECTED.  The SARS-CoV-2 RNA is generally detectable in upper respiratory specimens during the acute phase of infection. The lowest  concentration of SARS-CoV-2 viral copies this assay can detect is 138 copies/mL. A negative result does not preclude SARS-Cov-2 infection and should not be used as the sole basis for treatment or other patient management decisions. A negative result may occur with  improper specimen collection/handling, submission of specimen other than nasopharyngeal swab, presence of viral mutation(s) within the areas targeted by this assay, and inadequate number of viral copies(<138 copies/mL). A negative result must be combined with clinical observations, patient history, and epidemiological information. The expected result is Negative.  Fact Sheet for Patients:  EntrepreneurPulse.com.au  Fact Sheet for Healthcare Providers:   IncredibleEmployment.be  This test is no t yet approved or cleared by the [place] FDA and  has been authorized for detection and/or diagnosis of SARS-CoV-2 by FDA under an Emergency Use Authorization (EUA). This EUA will remain  in effect (meaning this test can be used) for the duration of the COVID-19 declaration under Section 564(b)(1) of the Act, 21 U.S.C.section 360bbb-3(b)(1), unless the authorization is terminated  or revoked sooner.       Influenza A by PCR NEGATIVE NEGATIVE Final   Influenza B by PCR NEGATIVE NEGATIVE Final    Comment: (NOTE) The Xpert Xpress SARS-CoV-2/FLU/RSV plus assay is intended as an aid in the diagnosis of influenza from Nasopharyngeal swab specimens and should not be used as a sole basis for treatment. Nasal washings and aspirates are unacceptable for Xpert Xpress SARS-CoV-2/FLU/RSV testing.  Fact Sheet for Patients: EntrepreneurPulse.com.au  Fact Sheet for Healthcare Providers: IncredibleEmployment.be  This test is not yet approved or cleared by the [place] FDA and has been authorized for detection and/or diagnosis of SARS-CoV-2 by FDA under an Emergency Use Authorization (EUA). This EUA will remain in effect (meaning this test can be used) for the duration of the COVID-19 declaration under Section 564(b)(1) of the Act, 21 U.S.C. section 360bbb-3(b)(1), unless the authorization is terminated or revoked.  Performed at [facility], [address] [place], [place] [address]   MRSA PCR Screening     Status: None   Collection Time: 05/12/20  4:38 PM   Specimen: Nasal Mucosa; Nasopharyngeal  Result Value Ref Range Status   MRSA by PCR NEGATIVE NEGATIVE Final    Comment:        The GeneXpert MRSA Assay (FDA approved for NASAL specimens only), is one component of a comprehensive MRSA colonization surveillance program. It is not intended to diagnose MRSA infection  nor to guide or monitor treatment for MRSA infections. Performed at [facility], [place] [address] [place], [place] [address]   Urine culture     Status: Abnormal   Collection Time: 05/13/20  5:30 AM   Specimen: Urine, Clean Catch  Result Value Ref Range Status   Specimen Description   Final    URINE, CLEAN CATCH Performed at [facility], [place] [address], [place], [place] [address]    Special Requests   Final    NONE Performed at [facility], [place] [address] [place], [place] [address]    Culture MULTIPLE SPECIES PRESENT, SUGGEST RECOLLECTION (A)  Final   Report Status 05/14/2020 FINAL  Final    RN Pressure Injury Documentation:     Estimated body mass index is 52.1 kg/m as calculated from the following:   Height as of this encounter: 5\' 2"  (1.575 m).   Weight as of this encounter: 129.2 kg.  Malnutrition Type:   Malnutrition Characteristics:   Nutrition Interventions:  Radiology Studies: No results found. Scheduled Meds: . amLODipine  5 mg Oral Daily  . azelastine  1 spray Each Nare BID  . carbamazepine  100 mg Oral BID  . levothyroxine  100 mcg Oral Daily  . megestrol  40 mg Oral BID  . montelukast  10 mg Oral QHS  . oxybutynin  5 mg Oral Daily  . pantoprazole  40 mg Oral BID  . sertraline  100 mg Oral Daily  . sodium bicarbonate  650 mg Oral BID  . sodium chloride flush  3 mL Intravenous Q12H  . sodium chloride flush  3 mL Intravenous Q12H  . traZODone  100 mg Oral QHS  . Warfarin - Pharmacist Dosing Inpatient   Does not apply q1600   Continuous Infusions: . sodium chloride    . heparin 2,450 Units/hr (05/17/20 1215)    LOS: 5 days   Kerney Elbe, DO [facility] PAGER is on [place]  If 7PM-7AM, please contact night-coverage [email or web address]

## 2020-05-17 NOTE — Discharge Summary (Signed)
Physician Discharge Summary  Sharon Rivers RAQ:[id number] DOB: February 09, 1970 DOA: 05/12/2020  PCP: [name], NP  Admit date: 05/12/2020 Discharge date: 05/18/2020  Admitted From: Home Disposition: Home  Recommendations for Outpatient Follow-up:  1. Follow up with PCP in 1-2 weeks 2. Repeat PT-INR within 3 days 3. Please obtain CMP/CBC, Mag, Phos in one week 4. Repeat TFTs in 4-6 weeks 5. Please follow up on the following pending results:  Home Health: No Equipment/Devices: None   Discharge Condition: Stable CODE STATUS: FULL CODE Diet recommendation: Heart Healthy Diet   Brief/Interim Summary: The patient is a 50 year old super morbidly obese Caucasian female with a past medical history significant for prior ankle DVT on anticoagulation with Xarelto as well as history of menorrhagia chronically on Megace, hypertension, hypothyroidism, insulin resistance, nonpitting edema in the lower extremities as well as other comorbidities who presented with a chief complaint of right-sided chest pain last few days.  Chest pain has gotten worse on the morning of admission was associated with right scapular pain and was constant in nature.  She also had some shortness of breath but noticed no significant worsening lower extremity edema palpation.  She presented med center [facility] and subsequently transferred to [facility] found to have an acute pulmonary embolus on CTA of the chest with CT evidence of right heart strain consistent with early submassive PE.  There is also small right effusion and known bilateral peripheral areas of groundglass and airspace consolidation compatible with pulmonary infarct.  CT of the abdomen pelvis showed no acute findings.  In the ED she was tachypneic, tachycardic with her blood pressure being stable and she was initiated on IV heparin.  Currently she is getting further work-up and treatment and lower extremity venous duplex does show right lower extremity DVT.   Echocardiogram done and showed no Right Ventricular Dysfunction. PT OT to evaluate once she is at least 24 hours of therapeutic heparin.  Because she is on carbamazepine and it interacts with the apixaban and rivaroxaban, we will change her to Coumadin and ensure that she is therapeutic prior to discharging. INR was 2.1 yesterday and today is 2.5. Was going to D/C the patient home yesterday with 7.5 mg of po Coumadin and Lovenox bridge but unfortunately cannot get a follow up for her with a Coumadin Clinic so Discharge was cancelled. Since her INR has been trending up will discharge with po 5 mg Warfarin daily and no Lovenox Bridge as today is day 5 of overlap. She remains stable to D/C and will need to follow up with PCP within 3 Days for INR Check.   Discharge Diagnoses:  Principal Problem:   Acute pulmonary embolism (HCC) Active Problems:   Morbid obesity ([place])   Hypertension   Hypothyroid   Family history of early CAD   GERD (gastroesophageal reflux disease)   Depression   OSA (obstructive sleep apnea)   Vitamin D deficiency   Insulin resistance   Heartburn   Essential hypertension   Other specified hypothyroidism   Trigeminal neuralgia   Pulmonary embolism and infarction (HCC)  Acute Bilateral Pulmonary Embolism with Associated Right Leg DVT -??Etiology.  -Patient with no long car rides. No recent surgeries. No oral contraceptive use. Patient however on Megace secondary to menorrhagia. -Patient noted to have an unprovoked DVT several years ago to her ankle and stated was on anticoagulation however cannot quite remember when that was and was on Xarelto then . -CT angio chest which was done concerning for submassive PE with  concern for right ventricular strain. -Cardiac enzymes negative with First Troponin I being 9 and repeat being 14. -SpO2: 96 % O2 Flow Rate (L/min): 2 L/min; Was not wearing Supplemental O2 via [place] -Check a 2D echo and showed "Left Ventricle: Left ventricular  ejection fraction, by estimation, is 60  to 65%. The left ventricle has normal function. The left ventricle has no regional wall motion abnormalities. Definity contrast agent was given IV to delineate the left ventricular endocardial borders. The left ventricular internal cavity size was normal in size. There is mild left ventricular hypertrophy. Left ventricular diastolic parameters are consistent with Grade I diastolic dysfunction  (impaired relaxation). Normal left  ventricular filling pressure.  Right Ventricle: The right ventricular size is normal. No increase in  right ventricular wall thickness. Right ventricular systolic function is normal." ; BNP was 7.5 -Lower extremity Dopplers showed "Findings consistent with acute deep vein thrombosis involving the right popliteal vein, and right posterior tibial veins."  -PESI Class at Least I due to Age; Will rule out Heart Failure  -Continue IV heparin and could likely transition to oral anticoagulation if patient continues to remain stable in about 48 to 72 hours and was considering changing to Apixaban but given that she is on Carbamazepine will not change to a DOAC given that DOAC's are strong inhibitors of CYP3A4 -**Had changed the patient to Coumadin and have Heparin gtt now; will need Therapeutic INR and Today yesterday 2.1 and today is 2.5; was going to send the patient home yesterday with Lovenox bridge for next 2 days as well as 7.5 mg of Coumadin daily with a repeat INR check on 05/19/2020 however unfortunately she is unable to get a appointment at the Coumadin clinic so we have held her discharge yesterday. Today is Day 5 of overlap so will not D/C on Lovenox bridge and will change Warfarin to 5 mg po Daily; She has a confirmed INR Appointment with PCP on 05/19/20 at 2:15pm -As this is patient's second event will likely need anticoagulation lifetime. -If patient's condition deteriorates will need to formally consult with PCCM for further evaluation and  management. -Will need an Ambulatory Home O2 Screen prior to D/C and she did not desaturate PT/OT to evaluate recommending NO home health  Hypertension -Was holding antihypertensive medications but will resume now with Amlodipine 5 mg  -Continue to Monitor BP per Protocol -Last BP was elevated at 147/96  Gastroesophageal Reflux Disease -C/w PPI with Pantoprazole 40 mg po BID  Hypothyroidism -Checked TSH and was 5.117 -Continue home regimen Levothyroxine 100 mcg po Daily -Repeat Thyroid Function Tests in 4-6 Weeks   Depression/Anxiety -Continue Home regimen Sertraline 100 po Daily.  -C/w Alprazolam 0.25 mg po BID as needed Anxiety  Trigeminal Neuralgia -Continue home regimen of Carbamazepine 100 mg po BID for now   Metabolic Acidosis -Mild and improved. Patient's CO2 is 22, AG is 11, and Chloride Level is 102 -Was getting NS at 100 mL/hr x 1 day and now stopped; Start po Sodium Bicarbonate 650 mg po BID -Continue to Monitor and Trend -Repeat CMP this AM  Hyponatremia -Patient's Na+ went from 133 -> 138 -> 132 -> 131 -> 135 -> 135 -> 134 -Continue to Monitor and Trend; Sodium Bicarbonate supplementation as Above -Repeat CMP in the AM   Hypokalemia -Patient's K+ was 3.3 and improved to 4.4 yesterday and was 3.8 today  -Continue to Monitor and Trend -Repeat CMP in the AM   Hyperglycemia -Patient's Blood Sugar ranging from 95-119;  This AM was 97 again  -Checked Hemoglobin A1c and was 4.8 -Continue to Monitor and Trend Blood Sugars carefully and if necessary will add Sensitive Novolog SSI AC   Super Morbid Obesity -Complicates overall prognosis and care -Estimated body mass index is 51.4 kg/m as calculated from the following:   Height as of this encounter: 5\' 2"  (1.575 m).   Weight as of this encounter: 127.5 kg. -Weight Loss and Dietary Counseling given   Discharge Instructions  Discharge Instructions    Call MD for:  difficulty breathing, headache or  visual disturbances   Complete by: As directed    Call MD for:  extreme fatigue   Complete by: As directed    Call MD for:  hives   Complete by: As directed    Call MD for:  persistant dizziness or light-headedness   Complete by: As directed    Call MD for:  persistant nausea and vomiting   Complete by: As directed    Call MD for:  redness, tenderness, or signs of infection (pain, swelling, redness, odor or green/yellow discharge around incision site)   Complete by: As directed    Call MD for:  severe uncontrolled pain   Complete by: As directed    Call MD for:  temperature >100.4   Complete by: As directed    Diet - low sodium heart healthy   Complete by: As directed    Discharge instructions   Complete by: As directed    You were cared for by a hospitalist during your hospital stay. If you have any questions about your discharge medications or the care you received while you were in the hospital after you are discharged, you can call the unit and ask to speak with the hospitalist on call if the hospitalist that took care of you is not available. Once you are discharged, your primary care physician will handle any further medical issues. Please note that NO REFILLS for any discharge medications will be authorized once you are discharged, as it is imperative that you return to your primary care physician (or establish a relationship with a primary care physician if you do not have one) for your aftercare needs so that they can reassess your need for medications and monitor your lab values.  Follow up with PCP and Coumadin Clinic within 3 days for INR check.. Take all medications as prescribed. If symptoms change or worsen please return to the ED for evaluation   Increase activity slowly   Complete by: As directed      Allergies as of 05/18/2020      Reactions   Adhesive [tape] Other (See Comments)   Skin Burn.   Biofreeze [menthol (topical Analgesic)] Other (See Comments)   Skin Burn.    Sulfa Antibiotics Rash      Medication List    TAKE these medications   acetaminophen 325 MG tablet Commonly known as: TYLENOL Take 2 tablets (650 mg total) by mouth every 6 (six) hours as needed for mild pain (or Fever >/= 101).   ALPRAZolam 0.25 MG tablet Commonly known as: XANAX TAKE 1 TABLET BY MOUTH TWICE DAILY AS NEEDED FOR ANXIETY AND FOR SLEEP. DO NOT TAKE WITH HYDROCODONE.   amLODipine 5 MG tablet Commonly known as: NORVASC Take 1 tablet (5 mg total) by mouth daily. What changed: when to take this   azelastine 0.1 % nasal spray Commonly known as: ASTELIN Place 1 spray into both nostrils 2 (two) times daily. Use in each  nostril as directed   carbamazepine 100 MG 12 hr tablet Commonly known as: TEGRETOL XR Take 1 tablet by mouth twice daily What changed: when to take this   enoxaparin 120 MG/0.8ML injection Commonly known as: LOVENOX Inject 0.8 mLs (120 mg total) into the skin 2 (two) times daily for 2 days.   fluticasone 50 MCG/ACT nasal spray Commonly known as: FLONASE Place 2 sprays into both nostrils daily. What changed:   when to take this  reasons to take this   levothyroxine 100 MCG tablet Commonly known as: SYNTHROID Take 1 tablet (100 mcg total) by mouth daily. What changed: when to take this   lisinopril 10 MG tablet Commonly known as: ZESTRIL Take 1 tablet (10 mg total) by mouth daily. What changed: when to take this   megestrol 40 MG tablet Commonly known as: MEGACE Take 1 tablet (40 mg total) by mouth 2 (two) times daily. Can increase to two tablets twice a day in the event of heavy bleeding What changed:   when to take this  additional instructions   methocarbamol 500 MG tablet Commonly known as: ROBAXIN TAKE 1 TABLET BY MOUTH AT BEDTIME AS NEEDED FOR MUSCLE SPASM What changed: See the new instructions.   montelukast 10 MG tablet Commonly known as: SINGULAIR Take 1 tablet (10 mg total) by mouth at bedtime.   ondansetron 4 MG  tablet Commonly known as: ZOFRAN Take 1 tablet (4 mg total) by mouth every 6 (six) hours as needed for nausea.   oxybutynin 5 MG 24 hr tablet Commonly known as: DITROPAN-XL Take 1 tablet (5 mg total) by mouth daily. What changed: when to take this   pantoprazole 40 MG tablet Commonly known as: PROTONIX Take 1 tablet (40 mg total) by mouth 2 (two) times daily. What changed: when to take this   promethazine-dextromethorphan 6.25-15 MG/5ML syrup Commonly known as: PROMETHAZINE-DM Take 5 mLs by mouth 4 (four) times daily as needed for cough.   senna-docusate 8.6-50 MG tablet Commonly known as: Senokot-S Take 1 tablet by mouth at bedtime as needed for mild constipation.   sertraline 100 MG tablet Commonly known as: ZOLOFT Take 1 tablet (100 mg total) by mouth daily. What changed: when to take this   traZODone 100 MG tablet Commonly known as: DESYREL Take 1 tablet (100 mg total) by mouth at bedtime.   warfarin 5 MG tablet Commonly known as: Coumadin Take 1.5 tablets (7.5 mg total) by mouth daily. Until INR is checked on 05/19/20 and then have PCP adjust       Allergies  Allergen Reactions  . Adhesive [Tape] Other (See Comments)    Skin Burn.  [name] [Menthol (Topical Analgesic)] Other (See Comments)    Skin Burn.  . Sulfa Antibiotics Rash    Consultations:  None  Procedures/Studies: CT Angio Chest PE W/Cm &/Or Wo Cm  Result Date: 05/12/2020 CLINICAL DATA:  PE suspected. High probability. Shortness of breath and right chest pain. Abdominal pain. Acute. Nonlocalized. EXAM: CT ANGIOGRAPHY CHEST CT ABDOMEN AND PELVIS WITH CONTRAST TECHNIQUE: Multidetector CT imaging of the chest was performed using the standard protocol during bolus administration of intravenous contrast. Multiplanar CT image reconstructions and MIPs were obtained to evaluate the vascular anatomy. Multidetector CT imaging of the abdomen and pelvis was performed using the standard protocol during bolus  administration of intravenous contrast. CONTRAST:  1103mL OMNIPAQUE IOHEXOL 350 MG/ML SOLN COMPARISON:  07/10/2017. FINDINGS: CTA CHEST FINDINGS Cardiovascular: There are bilateral lobar and segmental pulmonary artery filling  defects compatible with acute pulmonary embolus. The RV to LV ratio is equal to 1.0. Heart size is normal.  No pericardial effusion. Mediastinum/Nodes: No enlarged mediastinal, hilar, or axillary lymph nodes. Thyroid gland, trachea, and esophagus demonstrate no significant findings. Lungs/Pleura: Small right pleural effusion. Within the subpleural aspect of the posteromedial left lower lobe there is a focal area of airspace and ground-glass consolidation compatible with pulmonary infarct. A similar area of ground-glass and airspace consolidation is noted in the periphery of the anterior right upper lobe. No suspicious lung nodules. Musculoskeletal: Spondylosis identified within the thoracic spine. No acute or suspicious osseous findings. Review of the MIP images confirms the above findings. CT ABDOMEN and PELVIS FINDINGS Hepatobiliary: No focal liver abnormality is seen. Calcified gallstone measures 2.4 cm. No gallbladder wall thickening or signs of biliary ductal dilatation. Pancreas: Unremarkable. No pancreatic ductal dilatation or surrounding inflammatory changes. Spleen: Normal in size without focal abnormality. Adrenals/Urinary Tract: Normal adrenal glands. No kidney mass or hydronephrosis. Urinary bladder is unremarkable. Stomach/Bowel: Stomach is within normal limits. Appendix appears normal. No evidence of bowel wall thickening, distention, or inflammatory changes. Vascular/Lymphatic: Normal appearance of the abdominal aorta. No abdominopelvic adenopathy. Reproductive: Uterus and bilateral adnexa are unremarkable. Other: No ascites or focal fluid collection. Musculoskeletal: No acute osseous abnormality. Degenerative disc disease noted within the lower lumbar spine. Review of the MIP  images confirms the above findings. IMPRESSION: 1. Positive for acute PE with CTevidence of right heart strain (RV/LV Ratio = 1.0) consistent with at least submassive (intermediate risk) PE. The presence of right heart strain has been associated with an increased risk of morbidity and mortality. 2. Small right effusion and bilateral, peripheral areas of ground-glass and airspace consolidation compatible with pulmonary infarct. 3. No acute findings within the abdomen or pelvis. 4. Gallstone. 5. Critical Value/emergent results were called by telephone at the time of interpretation on 05/12/2020 at 12:33 pm to provider [facility] , who verbally acknowledged these results. Electronically Signed   By: Kerby Moors M.D.   On: 05/12/2020 12:33   CT Abdomen Pelvis W Contrast  Result Date: 05/12/2020 CLINICAL DATA:  PE suspected. High probability. Shortness of breath and right chest pain. Abdominal pain. Acute. Nonlocalized. EXAM: CT ANGIOGRAPHY CHEST CT ABDOMEN AND PELVIS WITH CONTRAST TECHNIQUE: Multidetector CT imaging of the chest was performed using the standard protocol during bolus administration of intravenous contrast. Multiplanar CT image reconstructions and MIPs were obtained to evaluate the vascular anatomy. Multidetector CT imaging of the abdomen and pelvis was performed using the standard protocol during bolus administration of intravenous contrast. CONTRAST:  169mL OMNIPAQUE IOHEXOL 350 MG/ML SOLN COMPARISON:  07/10/2017. FINDINGS: CTA CHEST FINDINGS Cardiovascular: There are bilateral lobar and segmental pulmonary artery filling defects compatible with acute pulmonary embolus. The RV to LV ratio is equal to 1.0. Heart size is normal.  No pericardial effusion. Mediastinum/Nodes: No enlarged mediastinal, hilar, or axillary lymph nodes. Thyroid gland, trachea, and esophagus demonstrate no significant findings. Lungs/Pleura: Small right pleural effusion. Within the subpleural aspect of the posteromedial  left lower lobe there is a focal area of airspace and ground-glass consolidation compatible with pulmonary infarct. A similar area of ground-glass and airspace consolidation is noted in the periphery of the anterior right upper lobe. No suspicious lung nodules. Musculoskeletal: Spondylosis identified within the thoracic spine. No acute or suspicious osseous findings. Review of the MIP images confirms the above findings. CT ABDOMEN and PELVIS FINDINGS Hepatobiliary: No focal liver abnormality is seen. Calcified gallstone measures 2.4 cm.  No gallbladder wall thickening or signs of biliary ductal dilatation. Pancreas: Unremarkable. No pancreatic ductal dilatation or surrounding inflammatory changes. Spleen: Normal in size without focal abnormality. Adrenals/Urinary Tract: Normal adrenal glands. No kidney mass or hydronephrosis. Urinary bladder is unremarkable. Stomach/Bowel: Stomach is within normal limits. Appendix appears normal. No evidence of bowel wall thickening, distention, or inflammatory changes. Vascular/Lymphatic: Normal appearance of the abdominal aorta. No abdominopelvic adenopathy. Reproductive: Uterus and bilateral adnexa are unremarkable. Other: No ascites or focal fluid collection. Musculoskeletal: No acute osseous abnormality. Degenerative disc disease noted within the lower lumbar spine. Review of the MIP images confirms the above findings. IMPRESSION: 1. Positive for acute PE with CTevidence of right heart strain (RV/LV Ratio = 1.0) consistent with at least submassive (intermediate risk) PE. The presence of right heart strain has been associated with an increased risk of morbidity and mortality. 2. Small right effusion and bilateral, peripheral areas of ground-glass and airspace consolidation compatible with pulmonary infarct. 3. No acute findings within the abdomen or pelvis. 4. Gallstone. 5. Critical Value/emergent results were called by telephone at the time of interpretation on 05/12/2020 at 12:33  pm to provider [facility] , who verbally acknowledged these results. Electronically Signed   By: Kerby Moors M.D.   On: 05/12/2020 12:33   DG CHEST PORT 1 VIEW  Result Date: 05/14/2020 CLINICAL DATA:  Shortness of breath. EXAM: PORTABLE CHEST 1 VIEW COMPARISON:  05/12/2020. FINDINGS: Cardiomegaly. Mild pulmonary venous congestion. Mild atelectasis/infiltrate right mid lung. No pleural effusion or pneumothorax. IMPRESSION: 1.  Cardiomegaly with mild pulmonary venous congestion. 2.  Mild atelectasis/infiltrate right mid lung. Electronically Signed   By: Marcello Moores  Register   On: 05/14/2020 05:26   DG Chest Portable 1 View  Result Date: 05/12/2020 CLINICAL DATA:  Right-sided chest pain and shortness of breath EXAM: PORTABLE CHEST 1 VIEW COMPARISON:  Prior chest x-ray 06/30/2017 FINDINGS: Stable cardiac and mediastinal contours. Chronic bronchitic changes and mild interstitial prominence appear unchanged compared to prior imaging from 2019. No focal airspace infiltrate, pleural effusion or pneumothorax. No acute osseous abnormality. IMPRESSION: Stable chest x-ray without evidence of acute cardiopulmonary process. Electronically Signed   By: Jacqulynn Cadet M.D.   On: 05/12/2020 11:31   ECHOCARDIOGRAM COMPLETE  Result Date: 05/13/2020    ECHOCARDIOGRAM REPORT   Patient Name:   PAISLEIGH MARONEY Date of Exam: 05/13/2020 Medical Rec #:  [id number]     Height:       62.0 in Accession #:    [id number]    Weight:       288.4 lb Date of Birth:  03/20/1970    BSA:          2.233 m Patient Age:    34 years      BP:           156/89 mmHg Patient Gender: F             HR:           88 bpm. Exam Location:  Inpatient Procedure: 2D Echo, Cardiac Doppler, Color Doppler and Intracardiac            Opacification Agent Indications:    I26.02 Pulmonary embolus  History:        Patient has prior history of Echocardiogram examinations, most                 recent 04/15/2016. Signs/Symptoms:Chest Pain and Dyspnea; Risk  Factors:Sleep Apnea and Hypertension. Hypothyroidism. GERD.  Sonographer:    Jonelle Sidle Dance Referring Phys: [id number] Eugenie Filler  Sonographer Comments: Patient is morbidly obese. Image acquisition challenging due to patient body habitus. IMPRESSIONS  1. Left ventricular ejection fraction, by estimation, is 60 to 65%. The left ventricle has normal function. The left ventricle has no regional wall motion abnormalities. There is mild left ventricular hypertrophy. Left ventricular diastolic parameters are consistent with Grade I diastolic dysfunction (impaired relaxation).  2. Right ventricular systolic function is normal. The right ventricular size is normal.  3. The mitral valve is grossly normal. No evidence of mitral valve regurgitation.  4. The aortic valve was not well visualized. Aortic valve regurgitation is not visualized. Conclusion(s)/Recommendation(s): No evidence of right heart strain. FINDINGS  Left Ventricle: Left ventricular ejection fraction, by estimation, is 60 to 65%. The left ventricle has normal function. The left ventricle has no regional wall motion abnormalities. Definity contrast agent was given IV to delineate the left ventricular  endocardial borders. The left ventricular internal cavity size was normal in size. There is mild left ventricular hypertrophy. Left ventricular diastolic parameters are consistent with Grade I diastolic dysfunction (impaired relaxation). Normal left ventricular filling pressure. Right Ventricle: The right ventricular size is normal. No increase in right ventricular wall thickness. Right ventricular systolic function is normal. Left Atrium: Left atrial size was normal in size. Right Atrium: Right atrial size was normal in size. Pericardium: There is no evidence of pericardial effusion. Mitral Valve: The mitral valve is grossly normal. No evidence of mitral valve regurgitation. Tricuspid Valve: The tricuspid valve is grossly normal. Tricuspid valve regurgitation is  trivial. Aortic Valve: The aortic valve was not well visualized. Aortic valve regurgitation is not visualized. Pulmonic Valve: The pulmonic valve was grossly normal. Pulmonic valve regurgitation is not visualized. Aorta: The aortic root was not well visualized. Venous: The inferior vena cava was not well visualized. IAS/Shunts: The interatrial septum was not well visualized.  LEFT VENTRICLE PLAX 2D LVIDd:         4.30 cm  Diastology LVIDs:         2.80 cm  LV e' medial:    7.94 cm/s LV PW:         1.40 cm  LV E/e' medial:  8.4 LV IVS:        1.00 cm  LV e' lateral:   12.40 cm/s LVOT diam:     2.00 cm  LV E/e' lateral: 5.4 LV SV:         63 LV SV Index:   28 LVOT Area:     3.14 cm  RIGHT VENTRICLE             IVC RV Basal diam:  2.40 cm     IVC diam: 2.10 cm RV S prime:     10.70 cm/s LEFT ATRIUM             Index       RIGHT ATRIUM          Index LA diam:        3.30 cm 1.48 cm/m  RA Area:     8.43 cm LA Vol (A2C):   25.3 ml 11.33 ml/m RA Volume:   14.70 ml 6.58 ml/m LA Vol (A4C):   21.0 ml 9.40 ml/m LA Biplane Vol: 24.0 ml 10.75 ml/m  AORTIC VALVE LVOT Vmax:   129.00 cm/s LVOT Vmean:  75.100 cm/s LVOT VTI:    0.200 m  AORTA  Ao Root diam: 3.10 cm Ao Asc diam:  3.70 cm MITRAL VALVE               TRICUSPID VALVE MV Area (PHT): 3.50 cm    TR Peak grad:   27.2 mmHg MV Decel Time: 217 msec    TR Vmax:        261.00 cm/s MV E velocity: 66.70 cm/s MV A velocity: 73.45 cm/s  SHUNTS MV E/A ratio:  0.91        Systemic VTI:  0.20 m                            Systemic Diam: 2.00 cm Lyman Bishop MD Electronically signed by Lyman Bishop MD Signature Date/Time: 05/13/2020/12:44:40 PM    Final    VAS [name] LOWER EXTREMITY VENOUS (DVT)  Result Date: 05/13/2020  Lower Venous DVT Study Indications: Pulmonary embolism.  Risk Factors: DVT HX. Comparison Study: 05/30/17 - LLE DVT PTV Performing Technologist: Rogelia Rohrer  Examination Guidelines: A complete evaluation includes B-mode imaging, spectral Doppler, color Doppler, and  power Doppler as needed of all accessible portions of each vessel. Bilateral testing is considered an integral part of a complete examination. Limited examinations for reoccurring indications may be performed as noted. The reflux portion of the exam is performed with the patient in reverse Trendelenburg.  +---------+---------------+---------+-----------+----------+------------------+ RIGHT    CompressibilityPhasicitySpontaneityPropertiesThrombus Aging     +---------+---------------+---------+-----------+----------+------------------+ CFV      Full                                                            +---------+---------------+---------+-----------+----------+------------------+ SFJ      Full                                                            +---------+---------------+---------+-----------+----------+------------------+ FV Prox  Full                                                            +---------+---------------+---------+-----------+----------+------------------+ FV Mid   Full                                         Not well                                                                 visualized         +---------+---------------+---------+-----------+----------+------------------+ FV DistalFull  Not well                                                                 visualized         +---------+---------------+---------+-----------+----------+------------------+ PFV      Full                                                            +---------+---------------+---------+-----------+----------+------------------+ POP      None           No       No                   Acute              +---------+---------------+---------+-----------+----------+------------------+ PTV      None           No       No                   Acute               +---------+---------------+---------+-----------+----------+------------------+ PERO                                                  Not seen on this                                                         exam               +---------+---------------+---------+-----------+----------+------------------+   +---------+---------------+---------+-----------+----------+-------------------+ LEFT     CompressibilityPhasicitySpontaneityPropertiesThrombus Aging      +---------+---------------+---------+-----------+----------+-------------------+ CFV      Full           Yes      Yes                                      +---------+---------------+---------+-----------+----------+-------------------+ SFJ      Full                                                             +---------+---------------+---------+-----------+----------+-------------------+ FV Prox  Full           Yes      Yes                                      +---------+---------------+---------+-----------+----------+-------------------+ FV Mid   Full  Yes      Yes                                      +---------+---------------+---------+-----------+----------+-------------------+ FV DistalFull           Yes      Yes                  Not well visualized +---------+---------------+---------+-----------+----------+-------------------+ PFV      Full                                                             +---------+---------------+---------+-----------+----------+-------------------+ POP      Full           Yes      Yes                                      +---------+---------------+---------+-----------+----------+-------------------+ PTV      Full                                                             +---------+---------------+---------+-----------+----------+-------------------+ PERO     Full                                         Not well visualized  +---------+---------------+---------+-----------+----------+-------------------+    Summary: BILATERAL: -No evidence of popliteal cyst, bilaterally. RIGHT: - Findings consistent with acute deep vein thrombosis involving the right popliteal vein, and right posterior tibial veins. - There is no evidence of superficial venous thrombosis.  LEFT: - There is no evidence of deep vein thrombosis in the lower extremity. - There is no evidence of superficial venous thrombosis.  *See table(s) above for measurements and observations. Electronically signed by Harold Barban MD on 05/13/2020 at 4:58:32 PM.    Final    LE Venous Duplex  Summary:  BILATERAL:  -No evidence of popliteal cyst, bilaterally.  RIGHT:  - Findings consistent with acute deep vein thrombosis involving the right  popliteal vein, and right posterior tibial veins.  - There is no evidence of superficial venous thrombosis.    LEFT:  - There is no evidence of deep vein thrombosis in the lower extremity.  - There is no evidence of superficial venous thrombosis.    ECHOCARDIOGRAM IMPRESSIONS    1. Left ventricular ejection fraction, by estimation, is 60 to 65%. The  left ventricle has normal function. The left ventricle has no regional  wall motion abnormalities. There is mild left ventricular hypertrophy.  Left ventricular diastolic parameters  are consistent with Grade I diastolic dysfunction (impaired relaxation).  2. Right ventricular systolic function is normal. The right ventricular  size is normal.  3. The mitral valve is grossly normal. No evidence of mitral valve  regurgitation.  4. The aortic valve was not well visualized. Aortic valve regurgitation  is not visualized.  Conclusion(s)/Recommendation(s): No evidence of right heart strain.   FINDINGS  Left Ventricle: Left ventricular ejection fraction, by estimation, is 60  to 65%. The left ventricle has normal function. The left ventricle has no  regional wall  motion abnormalities. Definity contrast agent was given IV  to delineate the left ventricular  endocardial borders. The left ventricular internal cavity size was normal  in size. There is mild left ventricular hypertrophy. Left ventricular  diastolic parameters are consistent with Grade I diastolic dysfunction  (impaired relaxation). Normal left  ventricular filling pressure.   Right Ventricle: The right ventricular size is normal. No increase in  right ventricular wall thickness. Right ventricular systolic function is  normal.   Left Atrium: Left atrial size was normal in size.   Right Atrium: Right atrial size was normal in size.   Pericardium: There is no evidence of pericardial effusion.   Mitral Valve: The mitral valve is grossly normal. No evidence of mitral  valve regurgitation.   Tricuspid Valve: The tricuspid valve is grossly normal. Tricuspid valve  regurgitation is trivial.   Aortic Valve: The aortic valve was not well visualized. Aortic valve  regurgitation is not visualized.   Pulmonic Valve: The pulmonic valve was grossly normal. Pulmonic valve  regurgitation is not visualized.   Aorta: The aortic root was not well visualized.   Venous: The inferior vena cava was not well visualized.   IAS/Shunts: The interatrial septum was not well visualized.     LEFT VENTRICLE  PLAX 2D  LVIDd:     4.30 cm Diastology  LVIDs:     2.80 cm LV e' medial:  7.94 cm/s  LV PW:     1.40 cm LV E/e' medial: 8.4  LV IVS:    1.00 cm LV e' lateral:  12.40 cm/s  LVOT diam:   2.00 cm LV E/e' lateral: 5.4  LV SV:     63  LV SV Index:  28  LVOT Area:   3.14 cm     RIGHT VENTRICLE       IVC  RV Basal diam: 2.40 cm   IVC diam: 2.10 cm  RV S prime:   10.70 cm/s   LEFT ATRIUM       Index    RIGHT ATRIUM     Index  LA diam:    3.30 cm 1.48 cm/m RA Area:   8.43 cm  LA Vol (A2C):  25.3 ml 11.33 ml/m RA Volume:   14.70 ml 6.58 ml/m  LA Vol (A4C):  21.0 ml 9.40 ml/m  LA Biplane Vol: 24.0 ml 10.75 ml/m  AORTIC VALVE  LVOT Vmax:  129.00 cm/s  LVOT Vmean: 75.100 cm/s  LVOT VTI:  0.200 m    AORTA  Ao Root diam: 3.10 cm  Ao Asc diam: 3.70 cm   MITRAL VALVE        TRICUSPID VALVE  MV Area (PHT): 3.50 cm  TR Peak grad:  27.2 mmHg  MV Decel Time: 217 msec  TR Vmax:    261.00 cm/s  MV E velocity: 66.70 cm/s  MV A velocity: 73.45 cm/s SHUNTS  MV E/A ratio: 0.91    Systemic VTI: 0.20 m               Systemic Diam: 2.00 cm    Subjective: Seen and examined at bedside was doing well and denied any complaints.  No nausea, vomiting, shortness breath or lightheadedness or dizziness.  No other concerns or complaints at this time and  ready to go home.  Discharge Exam: Vitals:   05/17/20 2215 05/18/20 0607  BP: (!) 151/94 (!) 147/96  Pulse: 84 91  Resp: 16 16  Temp: 97.9 F (36.6 C) 98.6 F (37 C)  SpO2: 96% 99%   Vitals:   05/17/20 1236 05/17/20 2215 05/18/20 0500 05/18/20 0607  BP: (!) 167/108 (!) 151/94  (!) 147/96  Pulse: 87 84  91  Resp: 16 16  16   Temp: 98.7 F (37.1 C) 97.9 F (36.6 C)  98.6 F (37 C)  TempSrc: Oral Oral  Oral  SpO2: 96% 96%  99%  Weight:   127.5 kg   Height:       General: Pt is alert, awake, not in acute distress Cardiovascular: RRR, S1/S2 +, no rubs, no gallops Respiratory: Diminished bilaterally, no wheezing, no rhonchi; she has unlabored breathing is not wearing supplemental oxygen via nasal cannula Abdominal: Soft, NT, distended secondary to body habitus, bowel sounds + Extremities: Has some edema worse on the right compared to left, no cyanosis  The results of significant diagnostics from this hospitalization (including imaging, microbiology, ancillary and laboratory) are listed below for reference.     Microbiology: Recent Results (from the past 240 hour(s))  Resp Panel by RT-PCR (Flu A&B, Covid)  Nasopharyngeal Swab     Status: None   Collection Time: 05/12/20 12:40 PM   Specimen: Nasopharyngeal Swab; Nasopharyngeal(NP) swabs in vial transport medium  Result Value Ref Range Status   SARS Coronavirus 2 by RT PCR NEGATIVE NEGATIVE Final    Comment: (NOTE) SARS-CoV-2 target nucleic acids are NOT DETECTED.  The SARS-CoV-2 RNA is generally detectable in upper respiratory specimens during the acute phase of infection. The lowest concentration of SARS-CoV-2 viral copies this assay can detect is 138 copies/mL. A negative result does not preclude SARS-Cov-2 infection and should not be used as the sole basis for treatment or other patient management decisions. A negative result may occur with  improper specimen collection/handling, submission of specimen other than nasopharyngeal swab, presence of viral mutation(s) within the areas targeted by this assay, and inadequate number of viral copies(<138 copies/mL). A negative result must be combined with clinical observations, patient history, and epidemiological information. The expected result is Negative.  Fact Sheet for Patients:  EntrepreneurPulse.com.au  Fact Sheet for Healthcare Providers:  IncredibleEmployment.be  This test is no t yet approved or cleared by the [place] FDA and  has been authorized for detection and/or diagnosis of SARS-CoV-2 by FDA under an Emergency Use Authorization (EUA). This EUA will remain  in effect (meaning this test can be used) for the duration of the COVID-19 declaration under Section 564(b)(1) of the Act, 21 U.S.C.section 360bbb-3(b)(1), unless the authorization is terminated  or revoked sooner.       Influenza A by PCR NEGATIVE NEGATIVE Final   Influenza B by PCR NEGATIVE NEGATIVE Final    Comment: (NOTE) The Xpert Xpress SARS-CoV-2/FLU/RSV plus assay is intended as an aid in the diagnosis of influenza from Nasopharyngeal swab specimens and should not be  used as a sole basis for treatment. Nasal washings and aspirates are unacceptable for Xpert Xpress SARS-CoV-2/FLU/RSV testing.  Fact Sheet for Patients: EntrepreneurPulse.com.au  Fact Sheet for Healthcare Providers: IncredibleEmployment.be  This test is not yet approved or cleared by the [place] FDA and has been authorized for detection and/or diagnosis of SARS-CoV-2 by FDA under an Emergency Use Authorization (EUA). This EUA will remain in effect (meaning this test can be used) for the duration  of the COVID-19 declaration under Section 564(b)(1) of the Act, 21 U.S.C. section 360bbb-3(b)(1), unless the authorization is terminated or revoked.  Performed at [facility], [address] [place], [place] [address]   MRSA PCR Screening     Status: None   Collection Time: 05/12/20  4:38 PM   Specimen: Nasal Mucosa; Nasopharyngeal  Result Value Ref Range Status   MRSA by PCR NEGATIVE NEGATIVE Final    Comment:        The GeneXpert MRSA Assay (FDA approved for NASAL specimens only), is one component of a comprehensive MRSA colonization surveillance program. It is not intended to diagnose MRSA infection nor to guide or monitor treatment for MRSA infections. Performed at [facility], [place] [address], [place], [place] [address]   Urine culture     Status: Abnormal   Collection Time: 05/13/20  5:30 AM   Specimen: Urine, Clean Catch  Result Value Ref Range Status   Specimen Description   Final    URINE, CLEAN CATCH Performed at [facility], [place] [address], [place], [place] [address]    Special Requests   Final    NONE Performed at [facility], [place] [address], [place], [place] [address]    Culture MULTIPLE SPECIES PRESENT, SUGGEST RECOLLECTION (A)  Final   Report Status 05/14/2020 FINAL  Final     Labs: BNP (last 3 results) Recent Labs    05/12/20 1044  BNP 7.5    Basic Metabolic Panel: Recent Labs  Lab 05/14/20 0257 05/15/20 0024 05/16/20 0436 05/17/20 0516 05/18/20 0517  NA 132* 131* 135 135 134*  K 3.6 3.3* 4.4 3.8 3.7  CL 103 103 106 102 103  CO2 20* 18* 20* 22 22  GLUCOSE 119* 101* 95 97 97  BUN 9 9 12 13 10   CREATININE 0.63 0.74 0.85 0.83 0.84  CALCIUM 8.5* 8.7* 8.6* 8.8* 8.8*  MG 2.0 2.0 2.1 2.1 2.2  PHOS 3.2 2.6 2.4* 3.6 4.0   Liver Function Tests: Recent Labs  Lab 05/14/20 0257 05/15/20 0024 05/16/20 0436 05/17/20 0516 05/18/20 0517  AST 13* 14* 25 17 15   ALT 14 15 18 18 18   ALKPHOS 55 53 49 53 53  BILITOT 0.8 0.6 1.0 0.5 0.5  PROT 6.9 7.3 6.9 7.5 7.4  ALBUMIN 3.5 3.7 3.6 3.8 3.5   Recent Labs  Lab 05/12/20 1044  LIPASE 28   No results for input(s): AMMONIA in the last 168 hours. CBC: Recent Labs  Lab 05/14/20 0257 05/15/20 0024 05/16/20 0436 05/17/20 0516 05/18/20 0517  WBC 8.6 9.7 6.6 7.4 6.4  NEUTROABS 6.6 7.5 4.2 4.9 4.4  HGB 12.3 12.8 12.7 13.4 12.7  HCT 36.3 38.4 38.4 40.6 37.9  MCV 85.8 87.5 88.9 87.5 86.7  PLT 238 259 298 302 278   Cardiac Enzymes: No results for input(s): CKTOTAL, CKMB, CKMBINDEX, TROPONINI in the last 168 hours. BNP: Invalid input(s): POCBNP CBG: Recent Labs  Lab 05/13/20 0958 05/14/20 0749 05/15/20 0750 05/16/20 0923 05/17/20 0735  GLUCAP 145* 124* 97 125* 98   D-Dimer No results for input(s): DDIMER in the last 72 hours. Hgb A1c Recent Labs    05/16/20 0436  HGBA1C 4.8   Lipid Profile No results for input(s): CHOL, HDL, LDLCALC, TRIG, CHOLHDL, LDLDIRECT in the last 72 hours. Thyroid function studies No results for input(s): TSH, T4TOTAL, T3FREE, THYROIDAB in the last 72 hours.  Invalid input(s): FREET3 Anemia work up No results for  input(s): VITAMINB12, FOLATE, FERRITIN, TIBC, IRON, RETICCTPCT in the last 72 hours. Urinalysis    Component Value Date/Time   COLORURINE YELLOW 05/13/2020 0530   APPEARANCEUR CLEAR 05/13/2020 0530   LABSPEC 1.021  05/13/2020 0530   PHURINE 6.0 05/13/2020 0530   GLUCOSEU NEGATIVE 05/13/2020 0530   GLUCOSEU NEGATIVE 08/18/2016 0819   HGBUR SMALL (A) 05/13/2020 0530   BILIRUBINUR NEGATIVE 05/13/2020 0530   BILIRUBINUR neg 01/06/2020 1203   KETONESUR NEGATIVE 05/13/2020 0530   PROTEINUR NEGATIVE 05/13/2020 0530   UROBILINOGEN 0.2 01/06/2020 1203   UROBILINOGEN 0.2 08/18/2016 0819   NITRITE NEGATIVE 05/13/2020 0530   LEUKOCYTESUR NEGATIVE 05/13/2020 0530   Sepsis Labs Invalid input(s): PROCALCITONIN,  WBC,  LACTICIDVEN Microbiology Recent Results (from the past 240 hour(s))  Resp Panel by RT-PCR (Flu A&B, Covid) Nasopharyngeal Swab     Status: None   Collection Time: 05/12/20 12:40 PM   Specimen: Nasopharyngeal Swab; Nasopharyngeal(NP) swabs in vial transport medium  Result Value Ref Range Status   SARS Coronavirus 2 by RT PCR NEGATIVE NEGATIVE Final    Comment: (NOTE) SARS-CoV-2 target nucleic acids are NOT DETECTED.  The SARS-CoV-2 RNA is generally detectable in upper respiratory specimens during the acute phase of infection. The lowest concentration of SARS-CoV-2 viral copies this assay can detect is 138 copies/mL. A negative result does not preclude SARS-Cov-2 infection and should not be used as the sole basis for treatment or other patient management decisions. A negative result may occur with  improper specimen collection/handling, submission of specimen other than nasopharyngeal swab, presence of viral mutation(s) within the areas targeted by this assay, and inadequate number of viral copies(<138 copies/mL). A negative result must be combined with clinical observations, patient history, and epidemiological information. The expected result is Negative.  Fact Sheet for Patients:  EntrepreneurPulse.com.au  Fact Sheet for Healthcare Providers:  IncredibleEmployment.be  This test is no t yet approved or cleared by the [place] FDA and  has been  authorized for detection and/or diagnosis of SARS-CoV-2 by FDA under an Emergency Use Authorization (EUA). This EUA will remain  in effect (meaning this test can be used) for the duration of the COVID-19 declaration under Section 564(b)(1) of the Act, 21 U.S.C.section 360bbb-3(b)(1), unless the authorization is terminated  or revoked sooner.       Influenza A by PCR NEGATIVE NEGATIVE Final   Influenza B by PCR NEGATIVE NEGATIVE Final    Comment: (NOTE) The Xpert Xpress SARS-CoV-2/FLU/RSV plus assay is intended as an aid in the diagnosis of influenza from Nasopharyngeal swab specimens and should not be used as a sole basis for treatment. Nasal washings and aspirates are unacceptable for Xpert Xpress SARS-CoV-2/FLU/RSV testing.  Fact Sheet for Patients: EntrepreneurPulse.com.au  Fact Sheet for Healthcare Providers: IncredibleEmployment.be  This test is not yet approved or cleared by the [place] FDA and has been authorized for detection and/or diagnosis of SARS-CoV-2 by FDA under an Emergency Use Authorization (EUA). This EUA will remain in effect (meaning this test can be used) for the duration of the COVID-19 declaration under Section 564(b)(1) of the Act, 21 U.S.C. section 360bbb-3(b)(1), unless the authorization is terminated or revoked.  Performed at [facility], [place] [place], [place] [address]   MRSA PCR Screening     Status: None   Collection Time: 05/12/20  4:38 PM   Specimen: Nasal Mucosa; Nasopharyngeal  Result Value Ref Range Status   MRSA by PCR NEGATIVE NEGATIVE Final    Comment:  The GeneXpert MRSA Assay (FDA approved for NASAL specimens only), is one component of a comprehensive MRSA colonization surveillance program. It is not intended to diagnose MRSA infection nor to guide or monitor treatment for MRSA infections. Performed at [facility], [place] [address],  [place], [place] [address]   Urine culture     Status: Abnormal   Collection Time: 05/13/20  5:30 AM   Specimen: Urine, Clean Catch  Result Value Ref Range Status   Specimen Description   Final    URINE, CLEAN CATCH Performed at [facility], [place] [address] [place], [place] [address]    Special Requests   Final    NONE Performed at [facility], [place] [address] [place], [place] [address]    Culture MULTIPLE SPECIES PRESENT, SUGGEST RECOLLECTION (A)  Final   Report Status 05/14/2020 FINAL  Final   Time coordinating discharge: 35 minutes  SIGNED:  Kerney Elbe, DO [facility] 05/18/2020, 8:16 AM Pager is on AMION  If 7PM-7AM, please contact night-coverage [email or web address]

## 2020-05-18 ENCOUNTER — Telehealth: Payer: Self-pay

## 2020-05-18 LAB — CBC WITH DIFFERENTIAL/PLATELET
Abs Immature Granulocytes: 0.11 10*3/uL — ABNORMAL HIGH (ref 0.00–0.07)
Basophils Absolute: 0 10*3/uL (ref 0.0–0.1)
Basophils Relative: 1 %
Eosinophils Absolute: 0.3 10*3/uL (ref 0.0–0.5)
Eosinophils Relative: 5 %
HCT: 37.9 % (ref 36.0–46.0)
Hemoglobin: 12.7 g/dL (ref 12.0–15.0)
Immature Granulocytes: 2 %
Lymphocytes Relative: 14 %
Lymphs Abs: 0.9 10*3/uL (ref 0.7–4.0)
MCH: 29.1 pg (ref 26.0–34.0)
MCHC: 33.5 g/dL (ref 30.0–36.0)
MCV: 86.7 fL (ref 80.0–100.0)
Monocytes Absolute: 0.7 10*3/uL (ref 0.1–1.0)
Monocytes Relative: 10 %
Neutro Abs: 4.4 10*3/uL (ref 1.7–7.7)
Neutrophils Relative %: 68 %
Platelets: 278 10*3/uL (ref 150–400)
RBC: 4.37 MIL/uL (ref 3.87–5.11)
RDW: 13.5 % (ref 11.5–15.5)
WBC: 6.4 10*3/uL (ref 4.0–10.5)
nRBC: 0 % (ref 0.0–0.2)

## 2020-05-18 LAB — COMPREHENSIVE METABOLIC PANEL
ALT: 18 U/L (ref 0–44)
AST: 15 U/L (ref 15–41)
Albumin: 3.5 g/dL (ref 3.5–5.0)
Alkaline Phosphatase: 53 U/L (ref 38–126)
Anion gap: 9 (ref 5–15)
BUN: 10 mg/dL (ref 6–20)
CO2: 22 mmol/L (ref 22–32)
Calcium: 8.8 mg/dL — ABNORMAL LOW (ref 8.9–10.3)
Chloride: 103 mmol/L (ref 98–111)
Creatinine, Ser: 0.84 mg/dL (ref 0.44–1.00)
GFR, Estimated: >60 mL/min (ref >60)
Glucose, Bld: 97 mg/dL (ref 70–99)
Potassium: 3.7 mmol/L (ref 3.5–5.1)
Sodium: 134 mmol/L — ABNORMAL LOW (ref 135–145)
Total Bilirubin: 0.5 mg/dL (ref 0.3–1.2)
Total Protein: 7.4 g/dL (ref 6.5–8.1)

## 2020-05-18 LAB — HEPARIN LEVEL (UNFRACTIONATED): Heparin Unfractionated: 0.71 IU/mL — ABNORMAL HIGH (ref 0.30–0.70)

## 2020-05-18 LAB — PHOSPHORUS: Phosphorus: 4 mg/dL (ref 2.5–4.6)

## 2020-05-18 LAB — PROTIME-INR
INR: 2.5 — ABNORMAL HIGH (ref 0.8–1.2)
Prothrombin Time: 25.8 seconds — ABNORMAL HIGH (ref 11.4–15.2)

## 2020-05-18 LAB — MAGNESIUM: Magnesium: 2.2 mg/dL (ref 1.7–2.4)

## 2020-05-18 MED ORDER — ENOXAPARIN SODIUM 300 MG/3ML IJ SOLN
190.0000 mg | Freq: Once | INTRAMUSCULAR | Status: AC
  Administered 2020-05-18: 190 mg via SUBCUTANEOUS
  Filled 2020-05-18: qty 1.9

## 2020-05-18 MED ORDER — WARFARIN SODIUM 5 MG PO TABS: 5.0000 mg | ORAL_TABLET | Freq: Every day | ORAL | 0 refills | Status: DC

## 2020-05-18 NOTE — TOC Transition Note (Signed)
Transition of Care [facility]) - CM/SW Discharge Note   Patient Details  Name: Dereonna Lensing MRN: [id number] Date of Birth: 1970/11/16  [facility]) CM/SW Contact:  Dessa Phi, RN Phone Number: 05/18/2020, 10:13 AM   Clinical Narrative:  PCP appt made for pt/inr check tomorrow-see d/c f/u section. No further CM needs       Barriers to Discharge: No Barriers Identified   Patient Goals and CMS Choice Patient states their goals for this hospitalization and ongoing recovery are:: to go home CMS Medicare.gov Compare Post Acute Care list provided to:: Patient    Discharge Placement                       Discharge Plan and Services   Discharge Planning Services: CM Consult                                 Social Determinants of Health (SDOH) Interventions     Readmission Risk Interventions No flowsheet data found.

## 2020-05-18 NOTE — Progress Notes (Signed)
ANTICOAGULATION CONSULT NOTE - Brief Note  Pharmacy Consult for Heparin Indication: pulmonary embolus    Patient Measurements: Height: 5\' 2"  (157.5 cm) Weight: 127.5 kg (281 lb) IBW/kg (Calculated) : 50.1 Heparin Dosing Weight: 82 kg  Medications:  Infusions:  . sodium chloride    . heparin 2,450 Units/hr (05/17/20 2331)    Assessment:   Briefly, Heparin IV per pharmacy for new PE, also starting warfarin per pharmacy on 4/7.  05/18/2020 HL 0.71 supra-therapeutic on  2450 units/hr CBC WNL No bleeding or line interruptions per RN  Goal of Therapy:  INR 2-3 Heparin level 0.3-0.7 units/ml Monitor platelets by anticoagulation protocol: Yes   Plan:  Decrease heparin drip to 2350 units/hr Heparin level 6 hours after rate change Daily heparin level and CBC  Dolly Rias RPh 05/18/2020, 6:06 AM

## 2020-05-18 NOTE — Telephone Encounter (Signed)
Patient schedule for tomorrow for nurse visit INR check. Discharge summary printed out for provider to review tomorrow.

## 2020-05-18 NOTE — Telephone Encounter (Signed)
OK to schedule INR tomorrow.  Can you please share a copy of the discharge summary with the doc of the day tomorrow so they will know about her clinical situation?

## 2020-05-18 NOTE — Progress Notes (Addendum)
ANTICOAGULATION CONSULT NOTE - Follow Up Consult  Pharmacy Consult for Heparin, Warfarin Indication: pulmonary embolus and DVT  Allergies  Allergen Reactions  . Adhesive [Tape] Other (See Comments)    Skin Burn.  [name] [Menthol (Topical Analgesic)] Other (See Comments)    Skin Burn.  . Sulfa Antibiotics Rash    Patient Measurements: Height: 5\' 2"  (157.5 cm) Weight: 127.5 kg (281 lb) IBW/kg (Calculated) : 50.1 Heparin Dosing Weight: 82 kg  Vital Signs: Temp: 98.6 F (37 C) (04/11 0607) Temp Source: Oral (04/11 0607) BP: 147/96 (04/11 0607) Pulse Rate: 91 (04/11 0607)  Labs: Recent Labs    05/16/20 0436 05/17/20 0516 05/18/20 0517  HGB 12.7 13.4 12.7  HCT 38.4 40.6 37.9  PLT 298 302 278  LABPROT 21.5* 22.5* 25.8*  INR 1.9* 2.1* 2.5*  HEPARINUNFRC 0.42 0.51 0.71*  CREATININE 0.85 0.83 0.84    Estimated Creatinine Clearance: 104.9 mL/min (by C-G formula based on SCr of 0.84 mg/dL).   Medications:  Scheduled:  . amLODipine  5 mg Oral Daily  . azelastine  1 spray Each Nare BID  . carbamazepine  100 mg Oral BID  . levothyroxine  100 mcg Oral Daily  . megestrol  40 mg Oral BID  . montelukast  10 mg Oral QHS  . oxybutynin  5 mg Oral Daily  . pantoprazole  40 mg Oral BID  . sertraline  100 mg Oral Daily  . sodium bicarbonate  650 mg Oral BID  . sodium chloride flush  3 mL Intravenous Q12H  . sodium chloride flush  3 mL Intravenous Q12H  . traZODone  100 mg Oral QHS  . Warfarin - Pharmacist Dosing Inpatient   Does not apply q1600   Infusions:  . sodium chloride    . heparin 2,350 Units/hr (05/18/20 9242)    Assessment: Patient is a 50 y.o F with hx DVT in 2018 presented to [facility] ED with c/o SOB and CP. Chest CT showed bilateral PE with evidence of right heart strain consistent with at least submassive PE. Pharmacy consulted to dose/monitor heparin drip and warfarin.   Patient is not a candidate for DOAC due to drug interaction between DOAC and  carbamazepine. Warfarin initiated on 4/7, currently remains on IV UFH for bridge.   Today, 05/18/2020: INR 2.5, therapeutic on day # 5 of overlap  - Patient will need a minimum of 5 days warfarin / Heparin overlap and until INR > 2 for 24 hours.  Heparin level elevated this morning at 0.71, rate reduced, plan to recheck at 1230. CBC: Hgb and Plt remain stable, WNL Diet: Heart healthy diet Drug-drug interactions: Carbamazepine is a CYP 1A2, 3A4, and 2C9 inducer; may decrease concentration of warfarin. Patients may require higher doses of warfarin to overcome DDI and achieve therapeutic INR. Patient warfarin education completed on 4/10.  Goal of Therapy:  INR 2-3 Heparin level 0.3-0.7 units/ml Monitor platelets by anticoagulation protocol: Yes   Plan:  Warfarin 5mg  PO today at 16:00 if still inpatient  For discharge, recommend Warfarin 5 mg daily and outpatient INR recheck in ~3 days. Continue heparin IV infusion at 2350 units/hr Heparin level in 6 hours after rate change this morning.   Daily INR, heparin level, and CBC  Gretta Arab PharmD, BCPS Clinical Pharmacist [facility] main pharmacy [phone] 05/18/2020 8:38 AM   Addendum: Planning for discharge today.  Per MD, stop Heparin, and to complete warfarin bridge, give Lovenox 1.5 mg/kg SQ one dose prior to discharge.  None needed  at home.    Gretta Arab PharmD, BCPS Clinical Pharmacist [facility] [phone] 05/18/2020 11:48 AM

## 2020-05-18 NOTE — Plan of Care (Signed)
  Problem: Consults Goal: Pharmacy Consult for anticoagulation Outcome: Progressing Goal: Skin Care Protocol Initiated - if Braden Score 18 or less Description: If consults are not indicated, leave blank or document N/A Outcome: Progressing   Problem: Discharge Progression Outcomes Goal: Pain controlled with appropriate interventions Outcome: Progressing Goal: Tolerating diet Outcome: Progressing Goal: Activity appropriate for discharge plan Outcome: Progressing   Problem: Education: Goal: Knowledge of General Education information will improve Description: Including pain rating scale, medication(s)/side effects and non-pharmacologic comfort measures Outcome: Progressing   Problem: Activity: Goal: Risk for activity intolerance will decrease Outcome: Progressing   Problem: Coping: Goal: Level of anxiety will decrease Outcome: Progressing   Problem: Elimination: Goal: Will not experience complications related to bowel motility Outcome: Progressing Goal: Will not experience complications related to urinary retention Outcome: Progressing   Problem: Pain Managment: Goal: General experience of comfort will improve Outcome: Progressing   Problem: Safety: Goal: Ability to remain free from injury will improve Outcome: Progressing   Problem: Skin Integrity: Goal: Risk for impaired skin integrity will decrease Outcome: Progressing

## 2020-05-18 NOTE — Telephone Encounter (Signed)
Caller is with case management at the hospital. Caller states patient is being discharged today and needs INR scheduled for tomorrow.

## 2020-05-19 ENCOUNTER — Telehealth: Payer: Self-pay

## 2020-05-19 ENCOUNTER — Other Ambulatory Visit: Payer: Self-pay

## 2020-05-19 ENCOUNTER — Telehealth: Payer: Self-pay | Admitting: Family

## 2020-05-19 ENCOUNTER — Ambulatory Visit (INDEPENDENT_AMBULATORY_CARE_PROVIDER_SITE_OTHER): Payer: 59

## 2020-05-19 DIAGNOSIS — I2699 Other pulmonary embolism without acute cor pulmonale: Secondary | ICD-10-CM

## 2020-05-19 LAB — POCT INR: INR: 2 (ref 2.0–3.0)

## 2020-05-19 NOTE — Telephone Encounter (Signed)
Patient did not p/up phone, waiting for patient to come in this pm for INR so we can set up her appt in person.

## 2020-05-19 NOTE — Telephone Encounter (Signed)
Pt here for INR check per Earlie Counts  Patient was seen in hospital for Pulmonary embolism on 05/12/2020. Discharged 05/18/2020.  She was advised to take coumadin 5 mg daily and check INR today.   Last INR =2.5  Pt denies recent antibiotics, no dietary changes and no unusual bruising / bleeding.  INR today = 2.0  I have scheduled one week follow up per Melissa's request. Please advise on INR.

## 2020-05-19 NOTE — Telephone Encounter (Signed)
Please contact pt to schedule hospital follow up in 1 week.  I could do a virtual visit if there are no openings.

## 2020-05-19 NOTE — Progress Notes (Signed)
Pt here for INR check per Earlie Counts  Patient was seen in hospital for Pulmonary embolism on 05/12/2020. Discharged 05/18/2020.  She was advised to take coumadin 5 mg daily and check INR today.   Last INR =2.5  Pt denies recent antibiotics, no dietary changes and no unusual bruising / bleeding.  INR today = 2.0  I have scheduled one week follow up per Melissa's request. Please advise on INR.

## 2020-05-19 NOTE — Telephone Encounter (Signed)
If she is off the lovenox  Take 7.5 today then 5 all other days and f/u next week

## 2020-05-19 NOTE — Telephone Encounter (Signed)
Was scheduled for ov 05-27-20

## 2020-05-19 NOTE — Telephone Encounter (Signed)
Sharon Rivers, [name] call was completed today. First available hospital follow up appt is on 05/29/20. I see a previous note that states you wanted her seen in 1 week. Would you like her to be scheduled with another provider if possible?

## 2020-05-19 NOTE — Telephone Encounter (Signed)
Transition Care Management Follow-up Telephone Call  Date of discharge and from where:05/18/2020  How have you been since you were released from the hospital? [facility] but still very tired. Dizzy this morning but better now.  Any questions or concerns? No  Items Reviewed:  Did the pt receive and understand the discharge instructions provided? Yes   Medications obtained and verified? Yes   Other? Yes   Any new allergies since your discharge? No   Dietary orders reviewed? Yes  Do you have support at home? Yes   Home Care and Equipment/Supplies: Were home health services ordered? no If so, what is the name of the agency? n/a  Has the agency set up a time to come to the patient's home? not applicable Were any new equipment or medical supplies ordered?  No What is the name of the medical supply agency? n/a Were you able to get the supplies/equipment? not applicable Do you have any questions related to the use of the equipment or supplies? n/a  Functional Questionnaire: (I = Independent and D = Dependent) ADLs: I  Bathing/Dressing- I  Meal Prep- I  Eating- I  Maintaining continence- I  Transferring/Ambulation- I  Managing Meds- I  Follow up appointments reviewed:   PCP Hospital f/u appt confirmed? No  Message sent to PCP to see if she can be worked in. No appt available in the next week.  [facility] f/u appt confirmed? n/a   Are transportation arrangements needed? No   If their condition worsens, is the pt aware to call PCP or go to the Emergency Dept.? Yes  Was the patient provided with contact information for the PCP's office or ED? Yes  Was to pt encouraged to call back with questions or concerns? Yes

## 2020-05-19 NOTE — Telephone Encounter (Signed)
Sent patient mychart message

## 2020-05-20 NOTE — Telephone Encounter (Signed)
If pt needs to be seen before 05/27/2020 then I can see her. Just ask you give me 40 minutes since I have never seen her and sounds like potential complicated hospital visit.  Reviewing Melissa in box today

## 2020-05-20 NOTE — Telephone Encounter (Signed)
I see that patient was scheduled for an office visit with you on 05/27/20. Will this be her hosp f/u visit as well?

## 2020-05-20 NOTE — Telephone Encounter (Signed)
She is ok to wait to see Melissa on the 20th. She just needed one week follow up

## 2020-05-25 ENCOUNTER — Encounter: Payer: Self-pay | Admitting: Pulmonary Disease

## 2020-05-25 ENCOUNTER — Other Ambulatory Visit: Payer: Self-pay

## 2020-05-25 ENCOUNTER — Ambulatory Visit (INDEPENDENT_AMBULATORY_CARE_PROVIDER_SITE_OTHER): Payer: 59 | Admitting: Pulmonary Disease

## 2020-05-25 VITALS — BP 132/84 | HR 95 | Temp 98.0°F | Ht 62.0 in | Wt 287.4 lb

## 2020-05-25 DIAGNOSIS — G4733 Obstructive sleep apnea (adult) (pediatric): Secondary | ICD-10-CM

## 2020-05-25 NOTE — Progress Notes (Signed)
Sharon Rivers, Critical Care, and Sleep Medicine  Chief Complaint  Patient presents with  . Consult    OSA    Constitutional:  BP 132/84 (BP Location: Left Arm, Cuff Size: Normal)   Pulse 95   Temp 98 F (36.7 C) (Oral)   Ht 5\' 2"  (1.575 m)   Wt 287 lb 6.4 oz (130.4 kg)   SpO2 96%   BMI 52.57 kg/m   Past Medical History:  Anxiety, Back pain, Depression, Headaches, GERD, DVT 2018, HTN, Hypothyroidism, Migraine headaches, OA, Panic, PE April 2022  Past Surgical History:  She  has a past surgical history that includes Cesarean section (2001 & 2002); Wisdom tooth extraction; Minor carpal tunnel; and Elbow surgery.  Brief Summary:  Sharon Rivers is a 50 y.o. female with obstructive sleep apnea.      Subjective:   She had a home sleep study in 2018.  Found to have severe sleep apnea.  Tried on CPAP, but wasn't able to tolerate this.  She turned the [facility] into [facility] a few years ago.  Her sleep has gotten worse.  She is snoring more and more restless at night.  She has trouble staying awake while reading or watching TV.  She has trouble with her breathing while asleep.  She goes to sleep at 10 pm.  She falls asleep in 30 minutes to an hour.  She wakes up some times to use the bathroom.  She gets out of bed at 645 am.  She feels tired in the morning.  She been getting morning headache.  She uses trazodone at about 8 pm.  She drinks caffeine sodas during the day.  She denies sleep walking, sleep talking, bruxism, or nightmares.  There is no history of restless legs.  She denies sleep hallucinations, sleep paralysis, or cataplexy.  The Epworth score is 19 out of 24.    Physical Exam:   Appearance - well kempt   ENMT - no sinus tenderness, no oral exudate, no LAN, Mallampati 3 airway, no stridor, poor dentition  Respiratory - equal breath sounds bilaterally, no wheezing or rales  CV - s1s2 regular rate and rhythm, no murmurs  Ext - no clubbing,  no edema  Skin - no rashes  Psych - normal mood and affect   Sleep Tests:   HST 03/13/16 >> AHI 39, SpO2 low 78%  CPAP titration 04/15/17 >> CPAP 11 cm H2O  Cardiac Tests:   Echo 05/13/20 >> EF 60 to 65%, mild LVH, grade 1 DD  Social History:  She  reports that she has quit smoking. Her smoking use included cigarettes. She has never used smokeless tobacco. She reports that she does not drink alcohol and does not use drugs.  Family History:  Her family history includes ADD / ADHD in her son; Alzheimer's disease in her maternal grandmother; Arthritis in her mother; Cancer in her maternal grandfather; Congestive Heart Failure in her father; Depression in her mother; Gout in her brother; Heart attack in her mother; Heart disease in her mother; Hypertension in her maternal grandfather, maternal grandmother, mother, paternal grandfather, and paternal grandmother; Migraines in her mother; Obesity in her mother; Other in her son; Parkinson's disease in her maternal grandmother; Thyroid disease in her mother.    Discussion:  She has snoring, sleep disruption, apnea, and daytime sleepiness.  Her BMI is > 35.  She has history of hypertension and depression.  Prior sleep study showed severe obstructive sleep apnea.  I am  concerned she still has significant sleep apnea and this has progressed.  Assessment/Plan:   Snoring with excessive daytime sleepiness. - will need to arrange for a home sleep study  Obesity. - discussed how weight can impact sleep and risk for sleep disordered breathing - discussed options to assist with weight loss: combination of diet modification, cardiovascular and strength training exercises  Cardiovascular risk. - had an extensive discussion regarding the adverse health consequences related to untreated sleep disordered breathing - specifically discussed the risks for hypertension, coronary artery disease, cardiac dysrhythmias, cerebrovascular disease, and diabetes -  lifestyle modification discussed  Safe driving practices. - discussed how sleep disruption can increase risk of accidents, particularly when driving - safe driving practices were discussed  Therapies for obstructive sleep apnea. - if the sleep study shows significant sleep apnea, then various therapies for treatment were reviewed: CPAP, oral appliance, and surgical interventions  Time Spent Involved in Patient Care on Day of Examination:  33 minutes  Follow up:  Patient Instructions  Will arrange for home sleep study Will call to arrange for follow up after sleep study reviewed    Medication List:   Allergies as of 05/25/2020      Reactions   Adhesive [tape] Other (See Comments)   Skin Burn.   Biofreeze [menthol (topical Analgesic)] Other (See Comments)   Skin Burn.   Sulfa Antibiotics Rash      Medication List       Accurate as of May 25, 2020  4:54 PM. If you have any questions, ask your nurse or doctor.        acetaminophen 325 MG tablet Commonly known as: TYLENOL Take 2 tablets (650 mg total) by mouth every 6 (six) hours as needed for mild pain (or Fever >/= 101).   ALPRAZolam 0.25 MG tablet Commonly known as: XANAX TAKE 1 TABLET BY MOUTH TWICE DAILY AS NEEDED FOR ANXIETY AND FOR SLEEP. DO NOT TAKE WITH HYDROCODONE.   amLODipine 5 MG tablet Commonly known as: NORVASC Take 1 tablet (5 mg total) by mouth daily. What changed: when to take this   azelastine 0.1 % nasal spray Commonly known as: ASTELIN Place 1 spray into both nostrils 2 (two) times daily. Use in each nostril as directed   carbamazepine 100 MG 12 hr tablet Commonly known as: TEGRETOL XR Take 1 tablet by mouth twice daily What changed: when to take this   fluticasone 50 MCG/ACT nasal spray Commonly known as: FLONASE Place 2 sprays into both nostrils daily. What changed:   when to take this  reasons to take this   levothyroxine 100 MCG tablet Commonly known as: SYNTHROID Take 1  tablet (100 mcg total) by mouth daily. What changed: when to take this   lisinopril 10 MG tablet Commonly known as: ZESTRIL Take 1 tablet (10 mg total) by mouth daily. What changed: when to take this   megestrol 40 MG tablet Commonly known as: MEGACE Take 1 tablet (40 mg total) by mouth 2 (two) times daily. Can increase to two tablets twice a day in the event of heavy bleeding What changed:   when to take this  additional instructions   methocarbamol 500 MG tablet Commonly known as: ROBAXIN TAKE 1 TABLET BY MOUTH AT BEDTIME AS NEEDED FOR MUSCLE SPASM What changed: See the new instructions.   montelukast 10 MG tablet Commonly known as: SINGULAIR Take 1 tablet (10 mg total) by mouth at bedtime.   ondansetron 4 MG tablet Commonly known as: ZOFRAN Take  1 tablet (4 mg total) by mouth every 6 (six) hours as needed for nausea.   oxybutynin 5 MG 24 hr tablet Commonly known as: DITROPAN-XL Take 1 tablet (5 mg total) by mouth daily. What changed: when to take this   pantoprazole 40 MG tablet Commonly known as: PROTONIX Take 1 tablet (40 mg total) by mouth 2 (two) times daily. What changed: when to take this   promethazine-dextromethorphan 6.25-15 MG/5ML syrup Commonly known as: PROMETHAZINE-DM Take 5 mLs by mouth 4 (four) times daily as needed for cough.   senna-docusate 8.6-50 MG tablet Commonly known as: Senokot-S Take 1 tablet by mouth at bedtime as needed for mild constipation.   sertraline 100 MG tablet Commonly known as: ZOLOFT Take 1 tablet (100 mg total) by mouth daily. What changed: when to take this   traZODone 100 MG tablet Commonly known as: DESYREL Take 1 tablet (100 mg total) by mouth at bedtime.   warfarin 5 MG tablet Commonly known as: Coumadin Take 1 tablet (5 mg total) by mouth daily. INR to bee checked on 05/19/20 and then have PCP adjust       Signature:  Chesley Mires, MD [place] Pager - [phone] 05/25/2020,  4:54 PM

## 2020-05-25 NOTE — Patient Instructions (Signed)
Will arrange for home sleep study Will call to arrange for follow up after sleep study reviewed  

## 2020-05-27 ENCOUNTER — Telehealth: Payer: Self-pay

## 2020-05-27 ENCOUNTER — Ambulatory Visit (INDEPENDENT_AMBULATORY_CARE_PROVIDER_SITE_OTHER): Payer: 59 | Admitting: Family

## 2020-05-27 ENCOUNTER — Other Ambulatory Visit: Payer: Self-pay

## 2020-05-27 ENCOUNTER — Other Ambulatory Visit: Payer: Self-pay | Admitting: Family

## 2020-05-27 ENCOUNTER — Telehealth: Payer: Self-pay | Admitting: *Deleted

## 2020-05-27 DIAGNOSIS — I2699 Other pulmonary embolism without acute cor pulmonale: Secondary | ICD-10-CM

## 2020-05-27 DIAGNOSIS — I824Y1 Acute embolism and thrombosis of unspecified deep veins of right proximal lower extremity: Secondary | ICD-10-CM | POA: Diagnosis not present

## 2020-05-27 DIAGNOSIS — I824Y9 Acute embolism and thrombosis of unspecified deep veins of unspecified proximal lower extremity: Secondary | ICD-10-CM

## 2020-05-27 LAB — POCT INR: INR: 2.5 (ref 2.0–3.0)

## 2020-05-27 MED ORDER — WARFARIN SODIUM 5 MG PO TABS: 5.0000 mg | ORAL_TABLET | Freq: Every day | ORAL | 0 refills | Status: DC

## 2020-05-27 NOTE — Telephone Encounter (Signed)
Received a call from the coumadin clinic to let us know that they can not follow up coumadin management for patient.  She will need to continue to monitor INR with [name].  Per provider, patient was advised to continue coumadin 5 mg daily and follow up in 2 weeks for INR check. Patient is scheduled for nurse visit 06-12-2020.

## 2020-05-27 NOTE — Progress Notes (Signed)
Subjective:   By signing my name below, I, [name], attest that this documentation has been prepared under the direction and in the presence of [name], NP. 05/27/2020    Patient ID: Sharon Rivers, female    DOB: 07/19/1970, 50 y.o.   MRN: [id number]  No chief complaint on file.   HPI Patient is in today for office visit. She reports during her last emergency room visit she was diagnosed with a PE/RLE DVT. Clot was unprecipitated. She was ultimately admitted to the hospital for anticoagulation and further treatment. Reviewed discharge summary.  She notes that she still has pain in her right lower extremities due to DVT discovered in that leg during her hospitalization. She also mentions that she struggles performing deep breaths. She has been taking 5 mg coumadin daily PO to manage these symptoms and has found relief. She notes she has no family history of blood clots. She is willing to make an appointment with a coumadin clinic.   Past Medical History:  Diagnosis Date  . Anxiety   . Back pain   . Chest pain    a. 07/2015 Myoview: EF 71%, medium size, mild intensity, partially reversible septal defect with overlying breast attenuation -> likely artifact. No significant reversible ischemia.  . Constipation   . Depression   . Edema    feet and legs  . Frequent headaches   . GERD (gastroesophageal reflux disease)   . History of DVT (deep vein thrombosis) 2018  . Hypertension   . Hypothyroidism   . Insulin resistance   . Joint pain   . Migraines   . OSA (obstructive sleep apnea) 03/17/2016  . Osteoarthritis   . Panic anxiety syndrome   . SOB (shortness of breath)    a. 04/2016 Echo: EF 60-65%, Gr1 DD.  [name] Swallowing difficulty     Past Surgical History:  Procedure Laterality Date  . CESAREAN SECTION  2001 & 2002  . ELBOW SURGERY    . MINOR CARPAL TUNNEL     pinched nerve in elbow  . WISDOM TOOTH EXTRACTION      Family History  Problem Relation Age of Onset   . Hypertension Mother        Living  . Arthritis Mother   . Thyroid disease Mother   . Heart disease Mother        MI at age 30  . Heart attack Mother   . Migraines Mother   . Depression Mother   . Obesity Mother   . Congestive Heart Failure Father        ?CHF  . Hypertension Maternal Grandmother   . Parkinson's disease Maternal Grandmother   . Alzheimer's disease Maternal Grandmother   . Cancer Maternal Grandfather   . Hypertension Maternal Grandfather   . Hypertension Paternal Grandmother   . Hypertension Paternal Grandfather   . Gout Brother   . ADD / ADHD Son        x1  . Other Son        #2-Unknown  . Diabetes Neg Hx     Social History   Socioeconomic History  . Marital status: Single    Spouse name: Not on file  . Number of children: Not on file  . Years of education: Not on file  . Highest education level: Not on file  Occupational History  . Occupation: Assembler    Comment: [employer]  Tobacco Use  . Smoking status: Former Smoker    Types: Cigarettes  .  Smokeless tobacco: Never Used  . Tobacco comment: quit 1992  Substance and Sexual Activity  . Alcohol use: No    Alcohol/week: 0.0 standard drinks  . Drug use: No  . Sexual activity: Yes    Partners: Male    Birth control/protection: None  Other Topics Concern  . Not on file  Social History Narrative   She has 2 children (grown). She did not retain custody of these children.   Lives with boyfriend in [place].   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 acetaminophen (TYLENOL) 325 MG tablet Take 2 tablets (650 mg total) by mouth every 6  (six) hours as needed for mild pain (or Fever >/= 101). 20 tablet 0  . ALPRAZolam (XANAX) 0.25 MG tablet TAKE 1 TABLET BY MOUTH TWICE DAILY AS NEEDED FOR ANXIETY AND FOR SLEEP. DO NOT TAKE WITH HYDROCODONE. 30 tablet 0  . amLODipine (NORVASC) 5 MG tablet Take 1 tablet (5 mg total) by mouth daily. (Patient taking differently: Take 5 mg by mouth every evening.) 90 tablet 1  . azelastine (ASTELIN) 0.1 % nasal spray Place 1 spray into both nostrils 2 (two) times daily. Use in each nostril as directed 30 mL 5  . carbamazepine (TEGRETOL XR) 100 MG 12 hr tablet Take 1 tablet by mouth twice daily (Patient taking differently: Take 100 mg by mouth every evening.) 60 tablet 0  . fluticasone (FLONASE) 50 MCG/ACT nasal spray Place 2 sprays into both nostrils daily. (Patient taking differently: Place 2 sprays into both nostrils daily as needed for allergies.) 16 g 6  . levothyroxine (SYNTHROID) 100 MCG tablet Take 1 tablet (100 mcg total) by mouth daily. (Patient taking differently: Take 100 mcg by mouth every evening.) 90 tablet 0  . lisinopril (ZESTRIL) 10 MG tablet Take 1 tablet (10 mg total) by mouth daily. (Patient taking differently: Take 10 mg by mouth every evening.) 90 tablet 1  . megestrol (MEGACE) 40 MG tablet Take 1 tablet (40 mg total) by mouth 2 (two) times daily. Can increase to two tablets twice a day in the event of heavy bleeding (Patient taking differently: Take 40 mg by mouth at bedtime.) 60 tablet 5  . methocarbamol (ROBAXIN) 500 MG tablet TAKE 1 TABLET BY MOUTH AT BEDTIME AS NEEDED FOR MUSCLE SPASM (Patient taking differently: Take 500 mg by mouth at bedtime as needed for muscle spasms.) 15 tablet 0  . montelukast (SINGULAIR) 10 MG tablet Take 1 tablet (10 mg total) by mouth at bedtime. 90 tablet 1  . ondansetron (ZOFRAN) 4 MG tablet Take 1 tablet (4 mg total) by mouth every 6 (six) hours as needed for nausea. 20 tablet 0  . oxybutynin (DITROPAN-XL) 5 MG 24 hr tablet Take 1 tablet (5 mg total) by  mouth daily. (Patient taking differently: Take 5 mg by mouth at bedtime.) 30 tablet 5  . pantoprazole (PROTONIX) 40 MG tablet Take 1 tablet (40 mg total) by mouth 2 (two) times daily. (Patient taking differently: Take 40 mg by mouth every evening.) 60 tablet 0  . promethazine-dextromethorphan (PROMETHAZINE-DM) 6.25-15 MG/5ML syrup Take 5 mLs by mouth 4 (four)  times daily as needed for cough. 240 mL 0  . senna-docusate (SENOKOT-S) 8.6-50 MG tablet Take 1 tablet by mouth at bedtime as needed for mild constipation. 30 tablet 0  . sertraline (ZOLOFT) 100 MG tablet Take 1 tablet (100 mg total) by mouth daily. (Patient taking differently: Take 100 mg by mouth at bedtime.) 90 tablet 1  . traZODone (DESYREL) 100 MG tablet Take 1 tablet (100 mg total) by mouth at bedtime. 90 tablet 1  . warfarin (COUMADIN) 5 MG tablet Take 1 tablet (5 mg total) by mouth daily. INR to bee checked on 05/19/20 and then have PCP adjust 30 tablet 0   No facility-administered medications prior to visit.    Allergies  Allergen Reactions  . Adhesive [Tape] Other (See Comments)    Skin Burn.  [name] [Menthol (Topical Analgesic)] Other (See Comments)    Skin Burn.  . Sulfa Antibiotics Rash    ROS     Objective:    Physical Exam Constitutional:      Appearance: She is well-developed.  Neck:     Thyroid: No thyromegaly.  Cardiovascular:     Rate and Rhythm: Normal rate and regular rhythm.     Pulses: Normal pulses.     Heart sounds: Normal heart sounds. No murmur heard.   Pulmonary:     Effort: Pulmonary effort is normal. No respiratory distress.     Breath sounds: Normal breath sounds. No wheezing.  Musculoskeletal:     Cervical back: Neck supple.  Skin:    General: Skin is warm and dry.  Neurological:     Mental Status: She is alert and oriented to person, place, and time.  Psychiatric:        Behavior: Behavior normal.        Thought Content: Thought content normal.        Judgment: Judgment normal.      There were no vitals taken for this visit. Wt Readings from Last 3 Encounters:  05/25/20 287 lb 6.4 oz (130.4 kg)  05/18/20 281 lb (127.5 kg)  04/21/20 292 lb (132.5 kg)    Diabetic Foot Exam - Simple   No data filed    Lab Results  Component Value Date   WBC 6.4 05/18/2020   HGB 12.7 05/18/2020   HCT 37.9 05/18/2020   PLT 278 05/18/2020   GLUCOSE 97 05/18/2020   CHOL 143 09/02/2019   TRIG 81.0 09/02/2019   HDL 40.60 09/02/2019   LDLCALC 86 09/02/2019   ALT 18 05/18/2020   AST 15 05/18/2020   NA 134 (L) 05/18/2020   K 3.7 05/18/2020   CL 103 05/18/2020   CREATININE 0.84 05/18/2020   BUN 10 05/18/2020   CO2 22 05/18/2020   TSH 5.117 (H) 05/15/2020   INR 2.0 05/19/2020   HGBA1C 4.8 05/16/2020    Lab Results  Component Value Date   TSH 5.117 (H) 05/15/2020   Lab Results  Component Value Date   WBC 6.4 05/18/2020   HGB 12.7 05/18/2020   HCT 37.9 05/18/2020   MCV 86.7 05/18/2020   PLT 278 05/18/2020   Lab Results  Component Value Date   NA 134 (L) 05/18/2020   K 3.7 05/18/2020   CO2 22 05/18/2020   GLUCOSE 97 05/18/2020   BUN 10 05/18/2020   CREATININE 0.84 05/18/2020   BILITOT 0.5 05/18/2020   ALKPHOS 53 05/18/2020   AST 15 05/18/2020   ALT 18 05/18/2020   PROT 7.4 05/18/2020   ALBUMIN 3.5  05/18/2020   CALCIUM 8.8 (L) 05/18/2020   ANIONGAP 9 05/18/2020   GFR 81.31 09/02/2019   Lab Results  Component Value Date   CHOL 143 09/02/2019   Lab Results  Component Value Date   HDL 40.60 09/02/2019   Lab Results  Component Value Date   LDLCALC 86 09/02/2019   Lab Results  Component Value Date   TRIG 81.0 09/02/2019   Lab Results  Component Value Date   CHOLHDL 4 09/02/2019   Lab Results  Component Value Date   HGBA1C 4.8 05/16/2020       Assessment & Plan:   Problem List Items Addressed This Visit   None      No orders of the defined types were placed in this encounter.   I, Shehryar [place], personally preformed the  services described in this documentation.  All medical record entries made by the scribe were at my direction and in my presence.  I have reviewed the chart and discharge instructions (if applicable) and agree that the record reflects my personal performance and is accurate and complete. 05/27/2020   I,[name],acting as a Education administrator for [name], NP.,have documented all relevant documentation on the behalf of [name], NP,as directed by  [name], NP while in the presence of [name], NP.    Shehryar [place], NP, have reviewed all documentation for this visit. The documentation on 05/28/20 for the exam, diagnosis, procedures, and orders are all accurate and complete.  [name] NP

## 2020-05-27 NOTE — Telephone Encounter (Signed)
Per referral 05/27/20 called and gave upcoming appointments

## 2020-05-28 DIAGNOSIS — I82401 Acute embolism and thrombosis of unspecified deep veins of right lower extremity: Secondary | ICD-10-CM | POA: Insufficient documentation

## 2020-05-28 NOTE — Assessment & Plan Note (Signed)
New. Unprecipitated. Continue coumadin, refer to hematology. Suspect they will recommend lifelong anti-coagulation.

## 2020-05-28 NOTE — Assessment & Plan Note (Signed)
Oxygen saturation is 100% today.  Her INR is therapeutic at 2.5. Recommended that she continue coumadin 5mg  once daily and repeat INR in 2 weeks (we tried to refer her to the coumadin clinic, but they declined as she does not have cardiac indication for anticoagulation. Will also refer to hematology.

## 2020-05-29 ENCOUNTER — Other Ambulatory Visit: Payer: Self-pay

## 2020-05-29 ENCOUNTER — Inpatient Hospital Stay (HOSPITAL_BASED_OUTPATIENT_CLINIC_OR_DEPARTMENT_OTHER): Payer: 59 | Admitting: Family

## 2020-05-29 ENCOUNTER — Encounter: Payer: Self-pay | Admitting: Family

## 2020-05-29 ENCOUNTER — Encounter: Payer: Self-pay | Admitting: Obstetrics and Gynecology

## 2020-05-29 ENCOUNTER — Inpatient Hospital Stay: Payer: 59 | Attending: Family

## 2020-05-29 VITALS — BP 137/96 | HR 88 | Temp 98.3°F | Resp 18 | Wt 291.0 lb

## 2020-05-29 DIAGNOSIS — I82441 Acute embolism and thrombosis of right tibial vein: Secondary | ICD-10-CM | POA: Diagnosis not present

## 2020-05-29 DIAGNOSIS — Z87891 Personal history of nicotine dependence: Secondary | ICD-10-CM | POA: Diagnosis not present

## 2020-05-29 DIAGNOSIS — I2699 Other pulmonary embolism without acute cor pulmonale: Secondary | ICD-10-CM

## 2020-05-29 DIAGNOSIS — Z7901 Long term (current) use of anticoagulants: Secondary | ICD-10-CM | POA: Insufficient documentation

## 2020-05-29 DIAGNOSIS — I82431 Acute embolism and thrombosis of right popliteal vein: Secondary | ICD-10-CM | POA: Insufficient documentation

## 2020-05-29 DIAGNOSIS — I824Y9 Acute embolism and thrombosis of unspecified deep veins of unspecified proximal lower extremity: Secondary | ICD-10-CM | POA: Diagnosis not present

## 2020-05-29 LAB — CBC WITH DIFFERENTIAL (CANCER CENTER ONLY)
Abs Immature Granulocytes: 0.02 10*3/uL (ref 0.00–0.07)
Basophils Absolute: 0 10*3/uL (ref 0.0–0.1)
Basophils Relative: 1 %
Eosinophils Absolute: 0.3 10*3/uL (ref 0.0–0.5)
Eosinophils Relative: 5 %
HCT: 35.1 % — ABNORMAL LOW (ref 36.0–46.0)
Hemoglobin: 12 g/dL (ref 12.0–15.0)
Immature Granulocytes: 0 %
Lymphocytes Relative: 14 %
Lymphs Abs: 0.9 10*3/uL (ref 0.7–4.0)
MCH: 28.8 pg (ref 26.0–34.0)
MCHC: 34.2 g/dL (ref 30.0–36.0)
MCV: 84.2 fL (ref 80.0–100.0)
Monocytes Absolute: 0.6 10*3/uL (ref 0.1–1.0)
Monocytes Relative: 10 %
Neutro Abs: 4.2 10*3/uL (ref 1.7–7.7)
Neutrophils Relative %: 70 %
Platelet Count: 290 10*3/uL (ref 150–400)
RBC: 4.17 MIL/uL (ref 3.87–5.11)
RDW: 13.2 % (ref 11.5–15.5)
WBC Count: 6 10*3/uL (ref 4.0–10.5)
nRBC: 0 % (ref 0.0–0.2)

## 2020-05-29 LAB — CMP (CANCER CENTER ONLY)
ALT: 15 U/L (ref 0–44)
AST: 11 U/L — ABNORMAL LOW (ref 15–41)
Albumin: 4 g/dL (ref 3.5–5.0)
Alkaline Phosphatase: 65 U/L (ref 38–126)
Anion gap: 7 (ref 5–15)
BUN: 15 mg/dL (ref 6–20)
CO2: 25 mmol/L (ref 22–32)
Calcium: 9.5 mg/dL (ref 8.9–10.3)
Chloride: 103 mmol/L (ref 98–111)
Creatinine: 0.8 mg/dL (ref 0.44–1.00)
GFR, Estimated: >60 mL/min (ref >60)
Glucose, Bld: 95 mg/dL (ref 70–99)
Potassium: 3.8 mmol/L (ref 3.5–5.1)
Sodium: 135 mmol/L (ref 135–145)
Total Bilirubin: 0.3 mg/dL (ref 0.3–1.2)
Total Protein: 7.1 g/dL (ref 6.5–8.1)

## 2020-05-29 NOTE — Progress Notes (Signed)
Hematology/Oncology Consultation   Name: Sharon Rivers      MRN: [id number]    Location: Room/bed info not found  Date: 05/29/2020 Time:3:29 PM   REFERRING PHYSICIAN: [name], NP  REASON FOR CONSULT: DVT right lower extremity and bilateral pulmonary emboli with right heart strain    DIAGNOSIS: DVT right lower extremity and bilateral pulmonary emboli with right heart strain   HISTORY OF PRESENT ILLNESS: Ms. Sharon Rivers is a very pleasant 50 yo caucasian female with history of DVT in the right ankle several years ago treated with 6 months of Xarelto.  She developed SOB and pain in the right side in early April. ED CT angio on 05/12/2020 showed bilateral lobar and segmental pulmonary artery filling defects consistent with acute PE. This showed evidence of right heart strain. She had small right effusion and bilateral, peripheral areas of ground-glass and airspace consolidation compatible with pulmonary infarct.  [name] of both lower extremities on 05/13/2020 showed DVT involving the right popliteal vein, and right posterior tibial veins. Left lower extremity negative.  ECHO showed EF of 60-65%.  She is currently on Tegretol for migraines and both Eliquis and Xarelto are contraindicated with this. She states that the Sharon Rivers is not really working and she has an appointment with her neurologist to discuss changing treatment.  She is currently on Coumadin which is being managed by her PCP until she sees cardiology and gets in with the coumadin clinic.  INR earlier this week was therapeutic at 2.5.  She states that the pain in her right chest and side and SOB are much improved.  She is still sore behind the right knee.  She has had a little bruising but no blood loss or petechiae.  She takes Megace to help with heavy irregular cycles and hot flashes. She states that she no longer has a cycle since starting the medication.  She has occasional cramping in her left hand.  No known family history of  thrombotic event.  No personal history of cancer. Familial history of cancer includes maternal grandpa with lung and maternal cousin with ovarian.  She has hypothyroidism and is taking Synthroid.  No history of diabetes.  She had an episode of tingling around her lips a few days ago. She denies any other symptoms with this and states that is resolved after a few minutes without intervention.  She has 2 sons and no history of miscarriage.  She has had multiple surgeries including 2 C-sections and right carpal tunnel release without any complications. No fever, chills, n/v, cough, rash, dizziness, palpitations, abdominal pain or changes in bowel or bladder habits.  No ETOH or recreational drug use.  She quit smoking < 1 ppw years ago as a teenager. She has maintained a good appetite and is staying well hydrated. Her weight is stable at 291 lbs.  She works for a works for [employer] call center.   ROS: All other 10 point review of systems is negative.   PAST MEDICAL HISTORY:   Past Medical History:  Diagnosis Date  . Anxiety   . Back pain   . Chest pain    a. 07/2015 Myoview: EF 71%, medium size, mild intensity, partially reversible septal defect with overlying breast attenuation -> likely artifact. No significant reversible ischemia.  . Constipation   . Depression   . Edema    feet and legs  . Frequent headaches   . GERD (gastroesophageal reflux disease)   . History of DVT (deep vein thrombosis)  2018  . Hypertension   . Hypothyroidism   . Insulin resistance   . Joint pain   . Migraines   . OSA (obstructive sleep apnea) 03/17/2016  . Osteoarthritis   . Panic anxiety syndrome   . SOB (shortness of breath)    a. 04/2016 Echo: EF 60-65%, Gr1 DD.  [name] Swallowing difficulty     ALLERGIES: Allergies  Allergen Reactions  . Camphor   . Menthol   . Other Other (See Comments)  . Adhesive [Tape] Other (See Comments)    Skin Burn.  Sharon Rivers [Menthol (Topical  Analgesic)] Other (See Comments)    Skin Burn.  . Sulfa Antibiotics Rash      MEDICATIONS:  Current Outpatient Medications on File Prior to Visit  Medication Sig Dispense Refill  . acetaminophen (TYLENOL) 325 MG tablet Take 2 tablets (650 mg total) by mouth every 6 (six) hours as needed for mild pain (or Fever >/= 101). 20 tablet 0  . ALPRAZolam (XANAX) 0.25 MG tablet TAKE 1 TABLET BY MOUTH TWICE DAILY AS NEEDED FOR ANXIETY AND FOR SLEEP. DO NOT TAKE WITH HYDROCODONE. 30 tablet 0  . amLODipine (NORVASC) 5 MG tablet Take 1 tablet (5 mg total) by mouth daily. (Patient taking differently: Take 5 mg by mouth every evening.) 90 tablet 1  . azelastine (ASTELIN) 0.1 % nasal spray Place 1 spray into both nostrils 2 (two) times daily. Use in each nostril as directed 30 mL 5  . carbamazepine (TEGRETOL XR) 100 MG 12 hr tablet Take 1 tablet by mouth twice daily (Patient taking differently: Take 100 mg by mouth every evening.) 60 tablet 0  . fluticasone (FLONASE) 50 MCG/ACT nasal spray Place 2 sprays into both nostrils daily. (Patient taking differently: Place 2 sprays into both nostrils daily as needed for allergies.) 16 g 6  . levothyroxine (SYNTHROID) 100 MCG tablet Take 1 tablet (100 mcg total) by mouth daily. (Patient taking differently: Take 100 mcg by mouth every evening.) 90 tablet 0  . lisinopril (ZESTRIL) 10 MG tablet Take 1 tablet (10 mg total) by mouth daily. (Patient taking differently: Take 10 mg by mouth every evening.) 90 tablet 1  . megestrol (MEGACE) 40 MG tablet Take 1 tablet (40 mg total) by mouth 2 (two) times daily. Can increase to two tablets twice a day in the event of heavy bleeding (Patient taking differently: Take 40 mg by mouth at bedtime.) 60 tablet 5  . methocarbamol (ROBAXIN) 500 MG tablet TAKE 1 TABLET BY MOUTH AT BEDTIME AS NEEDED FOR MUSCLE SPASM (Patient taking differently: Take 500 mg by mouth at bedtime as needed for muscle spasms.) 15 tablet 0  . montelukast (SINGULAIR) 10  MG tablet Take 1 tablet (10 mg total) by mouth at bedtime. 90 tablet 1  . ondansetron (ZOFRAN) 4 MG tablet Take 1 tablet (4 mg total) by mouth every 6 (six) hours as needed for nausea. 20 tablet 0  . oxybutynin (DITROPAN-XL) 5 MG 24 hr tablet Take 1 tablet (5 mg total) by mouth daily. (Patient taking differently: Take 5 mg by mouth at bedtime.) 30 tablet 5  . pantoprazole (PROTONIX) 40 MG tablet Take 1 tablet (40 mg total) by mouth 2 (two) times daily. (Patient taking differently: Take 40 mg by mouth every evening.) 60 tablet 0  . promethazine-dextromethorphan (PROMETHAZINE-DM) 6.25-15 MG/5ML syrup Take 5 mLs by mouth 4 (four) times daily as needed for cough. 240 mL 0  . senna-docusate (SENOKOT-S) 8.6-50 MG tablet Take 1 tablet by mouth at  bedtime as needed for mild constipation. 30 tablet 0  . sertraline (ZOLOFT) 100 MG tablet Take 1 tablet (100 mg total) by mouth daily. (Patient taking differently: Take 100 mg by mouth at bedtime.) 90 tablet 1  . traZODone (DESYREL) 100 MG tablet Take 1 tablet (100 mg total) by mouth at bedtime. 90 tablet 1  . warfarin (COUMADIN) 5 MG tablet Take 1 tablet (5 mg total) by mouth daily. 90 tablet 0   No current facility-administered medications on file prior to visit.     PAST SURGICAL HISTORY Past Surgical History:  Procedure Laterality Date  . CESAREAN SECTION  2001 & 2002  . ELBOW SURGERY    . MINOR CARPAL TUNNEL     pinched nerve in elbow  . WISDOM TOOTH EXTRACTION      FAMILY HISTORY: Family History  Problem Relation Age of Onset  . Hypertension Mother        Living  . Arthritis Mother   . Thyroid disease Mother   . Heart disease Mother        MI at age 23  . Heart attack Mother   . Migraines Mother   . Depression Mother   . Obesity Mother   . Congestive Heart Failure Father        ?CHF  . Hypertension Maternal Grandmother   . Parkinson's disease Maternal Grandmother   . Alzheimer's disease Maternal Grandmother   . Cancer Maternal  Grandfather   . Hypertension Maternal Grandfather   . Hypertension Paternal Grandmother   . Hypertension Paternal Grandfather   . Gout Brother   . ADD / ADHD Son        x1  . Other Son        #2-Unknown  . Diabetes Neg Hx     SOCIAL HISTORY:  reports that she has quit smoking. Her smoking use included cigarettes. She has never used smokeless tobacco. She reports that she does not drink alcohol and does not use drugs.  PERFORMANCE STATUS: The patient's performance status is 1 - Symptomatic but completely ambulatory  PHYSICAL EXAM: Most Recent Vital Signs: There were no vitals taken for this visit. BP (!) 137/96 (Patient Position: Sitting)   Pulse 88   Temp 98.3 F (36.8 C) (Oral)   Resp 18   Wt 291 lb (132 kg)   SpO2 100%   BMI 53.22 kg/m   General Appearance:    Alert, cooperative, no distress, appears stated age  Head:    Normocephalic, without obvious abnormality, atraumatic  Eyes:    PERRL, conjunctiva/corneas clear, EOM's intact, fundi    benign, both eyes        Throat:   Lips, mucosa, and tongue normal; teeth and gums normal  Neck:   Supple, symmetrical, trachea midline, no adenopathy;    thyroid:  no enlargement/tenderness/nodules; no carotid   bruit or JVD  Back:     Symmetric, no curvature, ROM normal, no CVA tenderness  Lungs:     Clear to auscultation bilaterally, respirations unlabored  Chest Wall:    No tenderness or deformity   Heart:    Regular rate and rhythm, S1 and S2 normal, no murmur, rub   or gallop     Abdomen:     Soft, non-tender, bowel sounds active all four quadrants,    no masses, no organomegaly        Extremities:   Extremities normal, atraumatic, no cyanosis or edema  Pulses:   2+ and symmetric all extremities  Skin:   Skin color, texture, turgor normal, no rashes or lesions  Lymph nodes:   Cervical, supraclavicular, and axillary nodes normal  Neurologic:   CNII-XII intact, normal strength, sensation and reflexes    throughout     LABORATORY DATA:  Results for orders placed or performed in visit on 05/29/20 (from the past 48 hour(s))  CBC with Differential (Cancer Center Only)     Status: Abnormal   Collection Time: 05/29/20  3:05 PM  Result Value Ref Range   WBC Count 6.0 4.0 - 10.5 K/uL   RBC 4.17 3.87 - 5.11 MIL/uL   Hemoglobin 12.0 12.0 - 15.0 g/dL   HCT 35.1 (L) 36.0 - 46.0 %   MCV 84.2 80.0 - 100.0 fL   MCH 28.8 26.0 - 34.0 pg   MCHC 34.2 30.0 - 36.0 g/dL   RDW 13.2 11.5 - 15.5 %   Platelet Count 290 150 - 400 K/uL   nRBC 0.0 0.0 - 0.2 %   Neutrophils Relative % 70 %   Neutro Abs 4.2 1.7 - 7.7 K/uL   Lymphocytes Relative 14 %   Lymphs Abs 0.9 0.7 - 4.0 K/uL   Monocytes Relative 10 %   Monocytes Absolute 0.6 0.1 - 1.0 K/uL   Eosinophils Relative 5 %   Eosinophils Absolute 0.3 0.0 - 0.5 K/uL   Basophils Relative 1 %   Basophils Absolute 0.0 0.0 - 0.1 K/uL   Immature Granulocytes 0 %   Abs Immature Granulocytes 0.02 0.00 - 0.07 K/uL    Comment: Performed at [facility] at [facility], [address], [place], [place] [address]      RADIOGRAPHY: No results found.     PATHOLOGY: None  ASSESSMENT/PLAN: Ms. Prucha is a very pleasant 50 yo caucasian female with recurrent right lower extremity DVT and now bilateral PE's.  She is tolerating Coumadin nicely so far.  I spoke with Dr. Marin Olp and we will have her stop the Megace as this has been associated with increased risk for thrombus. Patient aware. I reached out to her gynecologist Dr. Ihor Dow and requested the patient be changed to another form of birthcontrol.  She also understands that she will need to abstain from any estrogen based therapy as well.  We will plan to see her again in about 10 weeks for follow-up and get repeat CT angio and right lower extremity US at that time.  Hyper coag panel compatible with Coumadin drawn today and results are pending.   All questions were answered and she is in agreement  with the plan. The patient knows to call the clinic with any problems, questions or concerns. We can certainly see the patient much sooner if necessary.  The patient was discussed with Dr. Marin Olp and he is in agreement with the aforementioned.   Laverna Peace, NP

## 2020-05-30 LAB — LUPUS ANTICOAGULANT PANEL
DRVVT: 90.4 s — ABNORMAL HIGH (ref 0.0–47.0)
PTT Lupus Anticoagulant: 50.9 s (ref 0.0–51.9)

## 2020-05-30 LAB — DRVVT CONFIRM: dRVVT Confirm: 1.7 ratio — ABNORMAL HIGH (ref 0.8–1.2)

## 2020-05-30 LAB — DRVVT MIX: dRVVT Mix: 42.1 s — ABNORMAL HIGH (ref 0.0–40.4)

## 2020-05-31 LAB — BETA-2-GLYCOPROTEIN I ABS, IGG/M/A
Beta-2 Glyco I IgG: <9 GPI IgG units (ref 0–20)
Beta-2-Glycoprotein I IgA: <9 GPI IgA units (ref 0–25)
Beta-2-Glycoprotein I IgM: <9 GPI IgM units (ref 0–32)

## 2020-05-31 LAB — CARDIOLIPIN ANTIBODIES, IGG, IGM, IGA
Anticardiolipin IgA: <9 APL U/mL (ref 0–11)
Anticardiolipin IgG: <9 GPL U/mL (ref 0–14)
Anticardiolipin IgM: <9 MPL U/mL (ref 0–12)

## 2020-06-01 ENCOUNTER — Encounter: Payer: Self-pay | Admitting: Family

## 2020-06-01 LAB — HOMOCYSTEINE: Homocysteine: 17.2 umol/L — ABNORMAL HIGH (ref 0.0–14.5)

## 2020-06-05 ENCOUNTER — Other Ambulatory Visit: Payer: Self-pay | Admitting: Obstetrics and Gynecology

## 2020-06-05 ENCOUNTER — Ambulatory Visit: Payer: 59

## 2020-06-05 ENCOUNTER — Other Ambulatory Visit: Payer: Self-pay

## 2020-06-05 DIAGNOSIS — N3281 Overactive bladder: Secondary | ICD-10-CM

## 2020-06-05 DIAGNOSIS — G4733 Obstructive sleep apnea (adult) (pediatric): Secondary | ICD-10-CM

## 2020-06-05 LAB — FACTOR 5 LEIDEN

## 2020-06-05 LAB — PROTHROMBIN GENE MUTATION

## 2020-06-05 MED ORDER — OXYBUTYNIN CHLORIDE ER 5 MG PO TB24: 5.0000 mg | ORAL_TABLET | Freq: Every day | ORAL | 5 refills | Status: DC

## 2020-06-05 NOTE — Progress Notes (Signed)
Refill provided for oxybutynin 5mg  but patient has not follow up since January. Will need to contact her to schedule follow up appt.

## 2020-06-08 ENCOUNTER — Other Ambulatory Visit: Payer: Self-pay | Admitting: Family

## 2020-06-08 ENCOUNTER — Encounter: Payer: Self-pay | Admitting: *Deleted

## 2020-06-08 ENCOUNTER — Encounter: Payer: Self-pay | Admitting: Family

## 2020-06-08 ENCOUNTER — Telehealth: Payer: Self-pay | Admitting: *Deleted

## 2020-06-08 DIAGNOSIS — I824Y9 Acute embolism and thrombosis of unspecified deep veins of unspecified proximal lower extremity: Secondary | ICD-10-CM

## 2020-06-08 DIAGNOSIS — E7211 Homocystinuria: Secondary | ICD-10-CM

## 2020-06-08 DIAGNOSIS — I2699 Other pulmonary embolism without acute cor pulmonale: Secondary | ICD-10-CM

## 2020-06-08 MED ORDER — FOLIC ACID 1 MG PO TABS: 1.0000 mg | ORAL_TABLET | Freq: Every day | ORAL | 11 refills | Status: DC

## 2020-06-08 NOTE — Telephone Encounter (Signed)
Notified pt of results via mychart

## 2020-06-08 NOTE — Telephone Encounter (Signed)
-----   Message from Eliezer Bottom, NP sent at 06/08/2020  4:26 PM EDT ----- Hyper coag work up showed she had positive lupus anticoagulant which is an antibody often present in the even of a blood clot. She also had an elevated homocystine level which is from low folate and can contribute to the formation of a blood clot.  She will continue the same regimen with Coumadin. We will also add folic acid to treat the hyperhomocystinemia. We will continue to monitor these labs when she comes to visit. Thank you!    ----- Message ----- From: Interface, Lab In [place] Sent: 05/29/2020   3:23 PM EDT To: Eliezer Bottom, NP

## 2020-06-08 NOTE — Telephone Encounter (Signed)
-----   Message from Sarah M [place], NP sent at 06/08/2020  4:26 PM EDT ----- Hyper coag work up showed she had positive lupus anticoagulant which is an antibody often present in the even of a blood clot. She also had an elevated homocystine level which is from low folate and can contribute to the formation of a blood clot.  She will continue the same regimen with Coumadin. We will also add folic acid to treat the hyperhomocystinemia. We will continue to monitor these labs when she comes to visit. Thank you!    ----- Message ----- From: Interface, Lab In [facility] Sent: 05/29/2020   3:23 PM EDT To: Sarah M [place], NP   

## 2020-06-10 ENCOUNTER — Other Ambulatory Visit: Payer: Self-pay | Admitting: Family

## 2020-06-10 ENCOUNTER — Ambulatory Visit: Payer: 59 | Admitting: Obstetrics and Gynecology

## 2020-06-10 NOTE — Progress Notes (Signed)
Opened in error

## 2020-06-11 ENCOUNTER — Telehealth: Payer: Self-pay | Admitting: Pulmonary Disease

## 2020-06-11 DIAGNOSIS — G4733 Obstructive sleep apnea (adult) (pediatric): Secondary | ICD-10-CM

## 2020-06-11 NOTE — Telephone Encounter (Signed)
HST 06/06/20 >> AHI 26.4, SpO2 low 70%   Please inform her that her sleep study shows moderate obstructive sleep apnea.  Please arrange for ROV with me or NP to discuss treatment options.

## 2020-06-12 ENCOUNTER — Ambulatory Visit (INDEPENDENT_AMBULATORY_CARE_PROVIDER_SITE_OTHER): Payer: 59 | Admitting: Family

## 2020-06-12 ENCOUNTER — Other Ambulatory Visit: Payer: Self-pay

## 2020-06-12 DIAGNOSIS — Z7901 Long term (current) use of anticoagulants: Secondary | ICD-10-CM | POA: Diagnosis not present

## 2020-06-12 DIAGNOSIS — E8881 Metabolic syndrome: Secondary | ICD-10-CM

## 2020-06-12 LAB — POCT INR: INR: 2.4 (ref 2.0–3.0)

## 2020-06-12 NOTE — Progress Notes (Signed)
Pt here for INR check per [name] NP  Goal INR =2.0-3.0  Last INR =2.0  Pt currently takes Coumadin 5 mg daily  Pt denies recent antibiotics, no dietary changes and no unusual bruising / bleeding.  INR today = 2.4  Pt advised per Melissa pt should continue with the regimen above. Pt is to return in a month for recheck.  Pt is scheduled for 07/14/20

## 2020-06-12 NOTE — Telephone Encounter (Signed)
Called and went over HST results per Dr Halford Chessman with patient. All questions answered and patient expressed full understanding. Scheduled office visit for Tuesday 06/16/20 at 4:30pm with NP at the [facility] office. Patient agreeable to time, date and location. Nothing further needed at this time.

## 2020-06-16 ENCOUNTER — Other Ambulatory Visit: Payer: Self-pay

## 2020-06-16 ENCOUNTER — Encounter: Payer: Self-pay | Admitting: Family

## 2020-06-16 ENCOUNTER — Encounter: Payer: Self-pay | Admitting: Obstetrics and Gynecology

## 2020-06-16 ENCOUNTER — Ambulatory Visit (INDEPENDENT_AMBULATORY_CARE_PROVIDER_SITE_OTHER): Payer: 59 | Admitting: Adult Health

## 2020-06-16 ENCOUNTER — Encounter: Payer: Self-pay | Admitting: Adult Health

## 2020-06-16 DIAGNOSIS — G4733 Obstructive sleep apnea (adult) (pediatric): Secondary | ICD-10-CM | POA: Diagnosis not present

## 2020-06-16 DIAGNOSIS — I2699 Other pulmonary embolism without acute cor pulmonale: Secondary | ICD-10-CM | POA: Diagnosis not present

## 2020-06-16 NOTE — Assessment & Plan Note (Signed)
Moderate OSA - patient education given /begin CPAP 5-15  May use LUNA 3 G , trial of dream wear full face .    Plan  Patient Instructions  Begin CPAP At bedtime   Trial of Dreamwear Full face mask  Wear all night long.  Work on healthy weight loss.  Do not drive if sleepy  Follow up with Dr. Halford Chessman  Or Jacquees Gongora NP in 3 month and As needed    Please discuss Megace with GYN as this increases your risk for Blood clots.  Continue follow up with Hematology .  Continue on Coumadin .

## 2020-06-16 NOTE — Progress Notes (Signed)
Reviewed and agree with assessment/plan.   Chesley Mires, MD [facility] Pulmonary/Critical Care 06/16/2020, 5:36 PM Pager:  [phone]

## 2020-06-16 NOTE — Patient Instructions (Addendum)
Begin CPAP At bedtime   Trial of Dreamwear Full face mask  Wear all night long.  Work on healthy weight loss.  Do not drive if sleepy  Follow up with Dr. Halford Chessman  Or Takeshia Wenk NP in 3 month and As needed    Please discuss Megace with GYN as this increases your risk for Blood clots.  Continue follow up with Hematology .  Continue on Coumadin .

## 2020-06-16 NOTE — Assessment & Plan Note (Signed)
Recent dx of PE last month -now on Coumdain  Following with PCP and Hematology  She is on Megace, advised this increase risk of VTE . -advised hematology adivsed to stop this med  She is aware and will reach out to GYN

## 2020-06-16 NOTE — Progress Notes (Signed)
@Patient  ID: Sharon Rivers, female    DOB: [birth year], 50 y.o.   MRN: [id number]  Chief Complaint  Patient presents with  . Follow-up    Referring provider: [name], NP  HPI: 50 year old female seen for sleep consult May 25, 2020 to establish for sleep apnea  TEST/EVENTS :   HST 03/13/16 >> AHI 39, SpO2 low 78%  CPAP titration 04/15/17 >> CPAP 11 cm H2O  Cardiac Tests:   Echo 05/13/20 >> EF 60 to 65%, mild LVH, grade 1 DD   06/16/2020 Follow up ; OSA  Patient presents for a 1 month follow-up.  Patient was seen last visit to establish for sleep apnea.  She had previously been evaluated in 2018 found to have severe sleep apnea.  Home sleep study at that time showed AHI at 39/hour with SPO2 at 78%.  She tried CPAP briefly was unable to tolerate due to mask issues. Patient has had ongoing snoring and daytime sleepiness.  She was set up for another home sleep study that showed moderate sleep apnea with AHI at 26/hour and SPO2 low at 70%.  We discussed her sleep study results.  Patient education was given on sleep apnea potential complications of untreated sleep apnea. We went over treatment options including weight loss, oral appliance, CPAP.  Patient would like to proceed with CPAP .     Allergies  Allergen Reactions  . Camphor   . Menthol   . Other Other (See Comments)  . Adhesive [Tape] Other (See Comments)    Skin Burn.  [name] [Menthol (Topical Analgesic)] Other (See Comments)    Skin Burn.  . Sulfa Antibiotics Rash    Immunization History  Administered Date(s) Administered  . Tdap 03/14/2016    Past Medical History:  Diagnosis Date  . Anxiety   . Back pain   . Chest pain    a. 07/2015 Myoview: EF 71%, medium size, mild intensity, partially reversible septal defect with overlying breast attenuation -> likely artifact. No significant reversible ischemia.  . Constipation   . Depression   . Edema    feet and legs  . Frequent headaches   . GERD  (gastroesophageal reflux disease)   . History of DVT (deep vein thrombosis) 2018  . Hypertension   . Hypothyroidism   . Insulin resistance   . Joint pain   . Migraines   . OSA (obstructive sleep apnea) 03/17/2016  . Osteoarthritis   . Panic anxiety syndrome   . SOB (shortness of breath)    a. 04/2016 Echo: EF 60-65%, Gr1 DD.  [name] Swallowing difficulty     Tobacco History: Social History   Tobacco Use  Smoking Status Former Smoker  . Types: Cigarettes  . Quit date: 69  . Years since quitting: 30.3  Smokeless Tobacco Never Used  Tobacco Comment   quit 1992   Counseling given: Not Answered Comment: quit 1992   Outpatient Medications Prior to Visit  Medication Sig Dispense Refill  . acetaminophen (TYLENOL) 325 MG tablet Take 2 tablets (650 mg total) by mouth every 6 (six) hours as needed for mild pain (or Fever >/= 101). 20 tablet 0  . ALPRAZolam (XANAX) 0.25 MG tablet TAKE 1 TABLET BY MOUTH TWICE DAILY AS NEEDED FOR ANXIETY AND FOR SLEEP. DO NOT TAKE WITH HYDROCODONE. 30 tablet 0  . amLODipine (NORVASC) 5 MG tablet Take 1 tablet (5 mg total) by mouth daily. (Patient taking differently: Take 5 mg by mouth every evening.) 90 tablet 1  .  azelastine (ASTELIN) 0.1 % nasal spray Place 1 spray into both nostrils 2 (two) times daily. Use in each nostril as directed 30 mL 5  . carbamazepine (TEGRETOL XR) 100 MG 12 hr tablet Take 1 tablet by mouth twice daily (Patient taking differently: Take 100 mg by mouth every evening.) 60 tablet 0  . fluticasone (FLONASE) 50 MCG/ACT nasal spray Place 2 sprays into both nostrils daily. (Patient taking differently: Place 2 sprays into both nostrils daily as needed for allergies.) 16 g 6  . folic acid (FOLVITE) 1 MG tablet Take 1 tablet (1 mg total) by mouth daily. 30 tablet 11  . levothyroxine (SYNTHROID) 100 MCG tablet Take 1 tablet (100 mcg total) by mouth daily. (Patient taking differently: Take 100 mcg by mouth every evening.) 90 tablet 0  .  lisinopril (ZESTRIL) 10 MG tablet Take 1 tablet (10 mg total) by mouth daily. (Patient taking differently: Take 10 mg by mouth every evening.) 90 tablet 1  . megestrol (MEGACE) 40 MG tablet Take 1 tablet (40 mg total) by mouth 2 (two) times daily. Can increase to two tablets twice a day in the event of heavy bleeding (Patient taking differently: Take 40 mg by mouth at bedtime.) 60 tablet 5  . methocarbamol (ROBAXIN) 500 MG tablet TAKE 1 TABLET BY MOUTH AT BEDTIME AS NEEDED FOR MUSCLE SPASM (Patient taking differently: Take 500 mg by mouth at bedtime as needed for muscle spasms.) 15 tablet 0  . montelukast (SINGULAIR) 10 MG tablet Take 1 tablet (10 mg total) by mouth at bedtime. 90 tablet 1  . ondansetron (ZOFRAN) 4 MG tablet Take 1 tablet (4 mg total) by mouth every 6 (six) hours as needed for nausea. 20 tablet 0  . oxybutynin (DITROPAN-XL) 5 MG 24 hr tablet Take 1 tablet (5 mg total) by mouth at bedtime. 30 tablet 5  . pantoprazole (PROTONIX) 40 MG tablet Take 1 tablet (40 mg total) by mouth 2 (two) times daily. (Patient taking differently: Take 40 mg by mouth every evening.) 60 tablet 0  . promethazine-dextromethorphan (PROMETHAZINE-DM) 6.25-15 MG/5ML syrup Take 5 mLs by mouth 4 (four) times daily as needed for cough. 240 mL 0  . senna-docusate (SENOKOT-S) 8.6-50 MG tablet Take 1 tablet by mouth at bedtime as needed for mild constipation. 30 tablet 0  . sertraline (ZOLOFT) 100 MG tablet Take 1 tablet (100 mg total) by mouth daily. (Patient taking differently: Take 100 mg by mouth at bedtime.) 90 tablet 1  . traZODone (DESYREL) 100 MG tablet Take 1 tablet (100 mg total) by mouth at bedtime. 90 tablet 1  . warfarin (COUMADIN) 5 MG tablet Take 1 tablet (5 mg total) by mouth daily. 90 tablet 0   No facility-administered medications prior to visit.     Review of Systems:   Constitutional:   No  weight loss, night sweats,  Fevers, chills, + fatigue, or  lassitude.  HEENT:   No headaches,   Difficulty swallowing,  Tooth/dental problems, or  Sore throat,                No sneezing, itching, ear ache, nasal congestion, post nasal drip,   CV:  No chest pain,  Orthopnea, PND, swelling in lower extremities, anasarca, dizziness, palpitations, syncope.   GI  No heartburn, indigestion, abdominal pain, nausea, vomiting, diarrhea, change in bowel habits, loss of appetite, bloody stools.   Resp:    No chest wall deformity  Skin: no rash or lesions.  GU: no dysuria, change in color  of urine, no urgency or frequency.  No flank pain, no hematuria   MS:  No joint pain or swelling.  No decreased range of motion.  No back pain.    Physical Exam  BP 118/70 (BP Location: Left Wrist, Cuff Size: Normal)   Pulse 78   Temp 98 F (36.7 C) (Temporal)   Ht 5\' 2"  (1.575 m)   Wt 293 lb 3.2 oz (133 kg)   SpO2 97% Comment: RA  BMI 53.63 kg/m   GEN: A/Ox3; pleasant , NAD, well nourished    HEENT:  [place]/AT,  NOSE-clear, THROAT-clear, no lesions, no postnasal drip or exudate noted. Class 3 MP airway , poor dentition   NECK:  Supple w/ fair ROM; no JVD; normal carotid impulses w/o bruits; no thyromegaly or nodules palpated; no lymphadenopathy.    RESP  Clear  P & A; w/o, wheezes/ rales/ or rhonchi. no accessory muscle use, no dullness to percussion  CARD:  RRR, no m/r/g, no peripheral edema, pulses intact, no cyanosis or clubbing.  GI:   Soft & nt; nml bowel sounds; no organomegaly or masses detected.   Musco: Warm bil, no deformities or joint swelling noted.   Neuro: alert, no focal deficits noted.    Skin: Warm, no lesions or rashes    Lab Results:    ProBNP No results found for: PROBNP  Imaging: No results found.    No flowsheet data found.  No results found for: NITRICOXIDE      Assessment & Plan:   OSA (obstructive sleep apnea) Moderate OSA - patient education given /begin CPAP 5-15  May use LUNA 3 G , trial of dream wear full face .    Plan  Patient  Instructions  Begin CPAP At bedtime   Trial of Dreamwear Full face mask  Wear all night long.  Work on healthy weight loss.  Do not drive if sleepy  Follow up with Dr. Halford Chessman  Or Deamber Buckhalter NP in 3 month and As needed    Please discuss Megace with GYN as this increases your risk for Blood clots.  Continue follow up with Hematology .  Continue on Coumadin .          Pulmonary embolism and infarction [facility]) Recent dx of PE last month -now on Coumdain  Following with PCP and Hematology  She is on Megace, advised this increase risk of VTE . -advised hematology adivsed to stop this med  She is aware and will reach out to [place], NP 06/16/2020

## 2020-06-23 ENCOUNTER — Telehealth: Payer: Self-pay

## 2020-06-23 NOTE — Telephone Encounter (Signed)
Called and left a vm to call and r/s her 7/1 appts due to contrast shortage, ok per mary g and dr e to move appts   Sharon Rivers

## 2020-06-24 ENCOUNTER — Other Ambulatory Visit: Payer: Self-pay | Admitting: Family

## 2020-06-24 ENCOUNTER — Ambulatory Visit: Payer: 59 | Admitting: Physical Therapy

## 2020-06-25 ENCOUNTER — Telehealth (INDEPENDENT_AMBULATORY_CARE_PROVIDER_SITE_OTHER): Payer: 59 | Admitting: Obstetrics & Gynecology

## 2020-06-25 ENCOUNTER — Encounter: Payer: Self-pay | Admitting: Obstetrics & Gynecology

## 2020-06-25 DIAGNOSIS — N939 Abnormal uterine and vaginal bleeding, unspecified: Secondary | ICD-10-CM

## 2020-06-25 NOTE — Progress Notes (Signed)
GYNECOLOGY VIRTUAL VISIT ENCOUNTER NOTE  Provider location: [facility] [place] [facility]   Patient location: Home  I connected with Sharon Rivers on 06/25/20 at  8:15 AM EDT by MyChart Video Encounter and verified that I am speaking with the correct person using two identifiers.   I discussed the limitations, risks, security and privacy concerns of performing an evaluation and management service virtually and the availability of in person appointments. I also discussed with the patient that there may be a patient responsible charge related to this service. The patient expressed understanding and agreed to proceed.   History:  Sharon Rivers is a 50 y.o. [phone] female being evaluated today for AUB. Pt was admitted to [facility] with pulmonary embolism and right leg. In conversation with Dr. Vernona Rieger team, he felt that this was related to the Megace. Pt should not be on Progestins in the future. Pt reports that she has had no bleeding. Her last bleeding was in Nov 2021. Even on blood thinners, she had had no bleeding.  She denies any abnormal vaginal discharge, bleeding, pelvic pain or other concerns.       Past Medical History:  Diagnosis Date  . Anxiety   . Back pain   . Chest pain    a. 07/2015 Myoview: EF 71%, medium size, mild intensity, partially reversible septal defect with overlying breast attenuation -> likely artifact. No significant reversible ischemia.  . Constipation   . Depression   . Edema    feet and legs  . Frequent headaches   . GERD (gastroesophageal reflux disease)   . History of DVT (deep vein thrombosis) 2018  . Hypertension   . Hypothyroidism   . Insulin resistance   . Joint pain   . Migraines   . OSA (obstructive sleep apnea) 03/17/2016  . Osteoarthritis   . Panic anxiety syndrome   . SOB (shortness of breath)    a. 04/2016 Echo: EF 60-65%, Gr1 DD.  [name] Swallowing difficulty    Past Surgical History:  Procedure Laterality Date  .  CESAREAN SECTION  2001 & 2002  . ELBOW SURGERY    . MINOR CARPAL TUNNEL     pinched nerve in elbow  . WISDOM TOOTH EXTRACTION     The following portions of the patient's history were reviewed and updated as appropriate: allergies, current medications, past family history, past medical history, past social history, past surgical history and problem list.    Review of Systems:  Pertinent items noted in HPI and remainder of comprehensive ROS otherwise negative.  Physical Exam:   General:  Alert, oriented and cooperative. Patient appears to be in no acute distress.  Mental Status: Normal mood and affect. Normal behavior. Normal judgment and thought content.   Respiratory: Normal respiratory effort, no problems with respiration noted  Rest of physical exam deferred due to type of encounter  Labs and Imaging Results for orders placed or performed in visit on 06/12/20 (from the past 336 hour(s))  POCT INR   Collection Time: 06/12/20  3:49 PM  Result Value Ref Range   INR 2.4 2.0 - 3.0   No results found.     Assessment and Plan:     AUB- improved.  Pt was on Megace and developed a DVT and PE. She is now on Warfarin. She has had no further bleeding even on current anticoagulation. I have informed her NOT to take Progestins or EES in the future. I have listed this on her allergies  for future reference.        I discussed the assessment and treatment plan with the patient. The patient was provided an opportunity to ask questions and all were answered. The patient agreed with the plan and demonstrated an understanding of the instructions.   The patient was advised to call back or seek an in-person evaluation/go to the ED if the symptoms worsen or if the condition fails to improve as anticipated.  I provided 20 minutes of face-to-face time and review of records for this encounter.   Sharon Drafts, MD [facility] for [facility], [place]

## 2020-06-25 NOTE — Telephone Encounter (Signed)
Requesting: alprazolam 0.25mg  Contract: 07/22/19 UDS: 04/21/20 Last Visit: 05/27/2020 Next Visit: 07/10/2020 Last Refill: 04/27/2020 #30 and 0RF  Please Advise

## 2020-06-27 ENCOUNTER — Encounter: Payer: Self-pay | Admitting: Family

## 2020-06-29 ENCOUNTER — Encounter: Payer: 59 | Admitting: Physical Therapy

## 2020-06-30 ENCOUNTER — Ambulatory Visit: Payer: Self-pay | Admitting: Diagnostic Neuroimaging

## 2020-07-01 ENCOUNTER — Encounter: Payer: Self-pay | Admitting: Family

## 2020-07-03 ENCOUNTER — Encounter: Payer: Self-pay | Admitting: Family

## 2020-07-10 ENCOUNTER — Other Ambulatory Visit: Payer: Self-pay

## 2020-07-10 ENCOUNTER — Ambulatory Visit (INDEPENDENT_AMBULATORY_CARE_PROVIDER_SITE_OTHER): Payer: 59 | Admitting: Family

## 2020-07-10 ENCOUNTER — Encounter: Payer: Self-pay | Admitting: Family

## 2020-07-10 VITALS — BP 152/87 | HR 79 | Temp 98.4°F | Resp 18 | Wt 296.4 lb

## 2020-07-10 DIAGNOSIS — Z7901 Long term (current) use of anticoagulants: Secondary | ICD-10-CM

## 2020-07-10 DIAGNOSIS — K219 Gastro-esophageal reflux disease without esophagitis: Secondary | ICD-10-CM | POA: Diagnosis not present

## 2020-07-10 DIAGNOSIS — E039 Hypothyroidism, unspecified: Secondary | ICD-10-CM

## 2020-07-10 DIAGNOSIS — I2699 Other pulmonary embolism without acute cor pulmonale: Secondary | ICD-10-CM

## 2020-07-10 LAB — POCT INR: INR: 1.4 — AB (ref 2.0–3.0)

## 2020-07-10 MED ORDER — WARFARIN SODIUM 5 MG PO TABS: ORAL_TABLET | ORAL | 0 refills | Status: DC

## 2020-07-10 NOTE — Assessment & Plan Note (Signed)
Reports symptoms are stable on protonix 40mg  bid. She does not feel like her symptoms are controlled on once daily dosing.

## 2020-07-10 NOTE — Progress Notes (Signed)
Subjective:   By signing my name below, I, [name], attest that this documentation has been prepared under the direction and in the presence of [name] NP. 07/10/2020      Patient ID: Sharon Rivers, female    DOB: [birth year], 50 y.o.   MRN: [id number]  Chief Complaint  Patient presents with  . Follow-up    Pt has no concerns or problems  . blood clot    HPI Patient is in today for a office visit.  She complains of chest pain since her last visit. It worsens when coughing or when she exerts herself.  Anxiety- She continues taking 0.25 mg xanax 2x daily PO to manage her anxiety. Diet- She tries to eat healthy food. She limits her diet by skipping out breakfast. She drinks soda occasionally but starts drinking more water. Exercise- She participates in light exercise. If she overexerts herself she gets SOB. Allergies- She takes Flonase as needed. She takes her cough medicine as needed. Recently the medicine is not effective in managing her episodes of dry cough. She continues taking 10 mg Singulair daily PO. Sleep- She continues taking 100 mg trazodone daily PO to help her fall asleep. Hypertension- She continues taking 5 mg amlodipine daily PO and 10mg  lisinopril daily PO to manage her blood pressure. BP Readings from Last 3 Encounters:  07/10/20 (!) 152/87  06/16/20 118/70  05/29/20 (!) 137/96     Health Maintenance Due  Topic Date Due  . Pneumococcal Vaccine 42-12 Years old (1 of 2 - PPSV23) Never done  . COLONOSCOPY (Pts 45-57yrs Insurance coverage will need to be confirmed)  Never done    Past Medical History:  Diagnosis Date  . Anxiety   . Back pain   . Chest pain    a. 07/2015 Myoview: EF 71%, medium size, mild intensity, partially reversible septal defect with overlying breast attenuation -> likely artifact. No significant reversible ischemia.  . Constipation   . Depression   . Edema    feet and legs  . Frequent headaches   . GERD  (gastroesophageal reflux disease)   . History of DVT (deep vein thrombosis) 2018  . Hypertension   . Hypothyroidism   . Insulin resistance   . Joint pain   . Migraines   . OSA (obstructive sleep apnea) 03/17/2016  . Osteoarthritis   . Panic anxiety syndrome   . SOB (shortness of breath)    a. 04/2016 Echo: EF 60-65%, Gr1 DD.  [name] Swallowing difficulty     Past Surgical History:  Procedure Laterality Date  . CESAREAN SECTION  2001 & 2002  . ELBOW SURGERY    . MINOR CARPAL TUNNEL     pinched nerve in elbow  . WISDOM TOOTH EXTRACTION      Family History  Problem Relation Age of Onset  . Hypertension Mother        Living  . Arthritis Mother   . Thyroid disease Mother   . Heart disease Mother        MI at age 53  . Heart attack Mother   . Migraines Mother   . Depression Mother   . Obesity Mother   . Congestive Heart Failure Father        ?CHF  . Hypertension Maternal Grandmother   . Parkinson's disease Maternal Grandmother   . Alzheimer's disease Maternal Grandmother   . Cancer Maternal Grandfather   . Hypertension Maternal Grandfather   . Hypertension Paternal Grandmother   . Hypertension  Paternal Grandfather   . Gout Brother   . ADD / ADHD Son        x1  . Other Son        #2-Unknown  . Diabetes Neg Hx     Social History   Socioeconomic History  . Marital status: Single    Spouse name: Not on file  . Number of children: Not on file  . Years of education: Not on file  . Highest education level: Not on file  Occupational History  . Occupation: Assembler    Comment: [employer]  Tobacco Use  . Smoking status: Former Smoker    Types: Cigarettes    Quit date: 1992    Years since quitting: 30.4  . Smokeless tobacco: Never Used  . Tobacco comment: quit 1992  Substance and Sexual Activity  . Alcohol use: No    Alcohol/week: 0.0 standard drinks  . Drug use: No  . Sexual activity: Yes    Partners: Male    Birth control/protection: None  Other Topics Concern   . Not on file  Social History Narrative   She has 2 children (grown). She did not retain custody of these children.   Lives with boyfriend in [place].   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 acetaminophen (TYLENOL) 325 MG tablet Take 2 tablets (650 mg total) by mouth every 6 (six) hours as needed for mild pain (or Fever >/= 101). 20 tablet 0  . ALPRAZolam (XANAX) 0.25 MG tablet TAKE 1 TABLET BY MOUTH TWICE DAILY AS NEEDED FOR ANXIETY AND FOR SLEEP *DO  NOT  TAKE  WITH  HYDROCODONE* 30 tablet 0  . amLODipine (NORVASC) 5 MG tablet Take 1 tablet (5 mg total) by mouth daily. (Patient taking differently: Take 5 mg by mouth every evening.) 90 tablet 1  . azelastine (ASTELIN) 0.1 % nasal spray Place 1 spray into both nostrils 2 (two) times daily. Use in each nostril as directed 30 mL 5  . carbamazepine (TEGRETOL XR) 100 MG 12 hr tablet Take 1 tablet by mouth twice daily (Patient taking differently: Take 100 mg by mouth every evening.) 60 tablet 0  . fluticasone (FLONASE) 50 MCG/ACT nasal spray Place 2 sprays into both nostrils daily. (Patient taking differently: Place 2 sprays into both nostrils daily as needed for allergies.) 16 g 6  . levothyroxine (SYNTHROID) 100 MCG tablet Take 1 tablet (100 mcg total) by mouth daily. (Patient taking differently: Take 100 mcg by mouth every evening.) 90 tablet 0  . lisinopril (ZESTRIL) 10 MG tablet Take 1 tablet (10 mg total) by mouth daily. (Patient taking differently: Take 10 mg by mouth every evening.) 90 tablet 1  . montelukast (SINGULAIR) 10 MG tablet Take 1 tablet (10 mg total) by mouth at bedtime.  90 tablet 1  . ondansetron (ZOFRAN) 4 MG tablet Take 1 tablet (4 mg total) by mouth every 6 (six) hours as needed for nausea. 20 tablet 0  . oxybutynin (DITROPAN-XL) 5 MG 24 hr tablet Take 1 tablet (5 mg total) by mouth at bedtime. 30 tablet 5  . pantoprazole (PROTONIX) 40 MG  tablet Take 1 tablet (40 mg total) by mouth 2 (two) times daily. 60 tablet 0  . promethazine-dextromethorphan (PROMETHAZINE-DM) 6.25-15 MG/5ML syrup Take 5 mLs by mouth 4 (four) times daily as needed for cough. 240 mL 0  . senna-docusate (SENOKOT-S) 8.6-50 MG tablet Take 1 tablet by mouth at bedtime as needed for mild constipation. 30 tablet 0  . sertraline (ZOLOFT) 100 MG tablet Take 1 tablet (100 mg total) by mouth daily. (Patient taking differently: Take 100 mg by mouth at bedtime.) 90 tablet 1  . traZODone (DESYREL) 100 MG tablet Take 1 tablet (100 mg total) by mouth at bedtime. 90 tablet 1  . folic acid (FOLVITE) 1 MG tablet Take 1 tablet (1 mg total) by mouth daily. 30 tablet 11  . methocarbamol (ROBAXIN) 500 MG tablet TAKE 1 TABLET BY MOUTH AT BEDTIME AS NEEDED FOR MUSCLE SPASM (Patient taking differently: Take 500 mg by mouth at bedtime as needed for muscle spasms.) 15 tablet 0  . warfarin (COUMADIN) 5 MG tablet Take 1 tablet (5 mg total) by mouth daily. 90 tablet 0   No facility-administered medications prior to visit.    Allergies  Allergen Reactions  . Camphor   . Folic Acid Hives  . Megace [Megestrol] Other (See Comments)    Pt is s/p PE and DVT on low dose Megace  . Menthol   . Other Other (See Comments)  . Adhesive [Tape] Other (See Comments)    Skin Burn.  [name] [Menthol (Topical Analgesic)] Other (See Comments)    Skin Burn.  . Sulfa Antibiotics Rash    Review of Systems  Cardiovascular: Positive for chest pain.       Objective:    Physical Exam Constitutional:      General: She is not in acute distress.    Appearance: Normal appearance. She is not ill-appearing.  HENT:     Head:  Normocephalic and atraumatic.     Right Ear: External ear normal.     Left Ear: External ear normal.  Eyes:     Extraocular Movements: Extraocular movements intact.     Pupils: Pupils are equal, round, and reactive to light.  Cardiovascular:     Rate and Rhythm: Normal rate and regular rhythm.     Pulses: Normal pulses.     Heart sounds: Normal heart sounds. No murmur heard. No gallop.   Pulmonary:     Effort: Pulmonary effort is normal. No respiratory distress.     Breath sounds: No wheezing, rhonchi or rales.  Musculoskeletal:     Right lower leg: 1+ Edema present.     Left lower leg: 1+ Edema present.  Skin:    General: Skin is warm and dry.  Neurological:     Mental Status: She is alert and oriented to person, place, and time.  Psychiatric:        Behavior: Behavior normal.     BP (!) 152/87   Pulse 79   Temp 98.4 F (36.9 C)   Resp 18   Wt 296 lb 6.4 oz (134.4 kg)   SpO2 99%   BMI 54.21 kg/m  Wt Readings from Last 3 Encounters:  07/10/20 296 lb 6.4 oz (134.4 kg)  06/16/20 293 lb 3.2 oz (133 kg)  05/29/20 291 lb (132 kg)       Assessment & Plan:   Problem List Items Addressed This Visit      Unprioritized   Pulmonary embolism and infarction ([place]) - Primary    INR  subtherapeutic.  Will change coumadin to 5mg  once daily and 7.5mg  twice weekly.       Relevant Medications   warfarin (COUMADIN) 5 MG tablet   Hypothyroid    Lab Results  Component Value Date   TSH 5.117 (H) 05/15/2020   Maintained on synthroid 176mcg once daily. Continue same. Obtain TSH.       Relevant Orders   TSH   GERD (gastroesophageal reflux disease)    Reports symptoms are stable on protonix 40mg  bid. She does not feel like her symptoms are controlled on once daily dosing.         Other Visit Diagnoses    Anticoagulant long-term use       Relevant Orders   POCT INR (Completed)       Meds ordered this encounter  Medications  . warfarin (COUMADIN) 5 MG tablet    Sig:  Take 1 tab by mouth once daily and 1.5 tabs on Fridays and Tuesdays    Dispense:  90 tablet    Refill:  0    Order Specific Question:   Supervising Provider    Answer:   Penni Homans A [[id number]]    I, [name] NP, personally preformed the services described in this documentation.  All medical record entries made by the scribe were at my direction and in my presence.  I have reviewed the chart and discharge instructions (if applicable) and agree that the record reflects my personal performance and is accurate and complete. 07/10/2020   I,[name],acting as a Education administrator for [name], NP.,have documented all relevant documentation on the behalf of [name], NP,as directed by  [name], NP while in the presence of [name], NP.   [name], NP

## 2020-07-10 NOTE — Assessment & Plan Note (Signed)
BP mildly elevated. Pt attributes this to rushing from work to get here.  BP Readings from Last 3 Encounters:  07/10/20 (!) 152/87  06/16/20 118/70  05/29/20 (!) 137/96   Continue amlodipine 5mg  once daily and lisinopril 10mg . She notes a dry cough

## 2020-07-10 NOTE — Patient Instructions (Addendum)
Please keep your upcoming appointment with Hematology.  Please change your coumadin one tablet daily except 1.5 tabs on Fridays and Tuesdays.

## 2020-07-10 NOTE — Assessment & Plan Note (Signed)
INR subtherapeutic.  Will change coumadin to 5mg  once daily and 7.5mg  twice weekly.

## 2020-07-10 NOTE — Assessment & Plan Note (Signed)
Lab Results  Component Value Date   TSH 5.117 (H) 05/15/2020   Maintained on synthroid 187mcg once daily. Continue same. Obtain TSH.

## 2020-07-14 ENCOUNTER — Ambulatory Visit (INDEPENDENT_AMBULATORY_CARE_PROVIDER_SITE_OTHER): Payer: 59 | Admitting: Diagnostic Neuroimaging

## 2020-07-14 ENCOUNTER — Ambulatory Visit: Payer: 59

## 2020-07-14 ENCOUNTER — Encounter: Payer: 59 | Admitting: Physical Therapy

## 2020-07-14 ENCOUNTER — Encounter: Payer: Self-pay | Admitting: Diagnostic Neuroimaging

## 2020-07-14 VITALS — BP 152/91 | HR 87 | Ht 62.0 in | Wt 296.2 lb

## 2020-07-14 DIAGNOSIS — R519 Headache, unspecified: Secondary | ICD-10-CM | POA: Diagnosis not present

## 2020-07-14 DIAGNOSIS — G43009 Migraine without aura, not intractable, without status migrainosus: Secondary | ICD-10-CM | POA: Diagnosis not present

## 2020-07-14 MED ORDER — TOPIRAMATE 50 MG PO TABS: 50.0000 mg | ORAL_TABLET | Freq: Two times a day (BID) | ORAL | 12 refills | Status: DC

## 2020-07-14 MED ORDER — RIZATRIPTAN BENZOATE 10 MG PO TABS: ORAL_TABLET | ORAL | 6 refills | Status: DC

## 2020-07-14 NOTE — Patient Instructions (Addendum)
MIGRAINE WITHOUT AURA (4-6 per month) - stop carbamazepine - start topiramate 50mg  at bedtime; increase to twice a day after 1-2 weeks - start rizatriptan 10mg  as needed for migraine rescue  CHANGE IN HEADACHE TYPE (starting 2022) - could be change in migraine quality, but recommend CT head to rule out other causes (has piercing that cannot be removed so cannot have MRI)

## 2020-07-14 NOTE — Progress Notes (Signed)
[facility]  PATIENT: Sharon Rivers DOB: [birth year]  REFERRING CLINICIAN: [name], NP HISTORY FROM: patient  REASON FOR VISIT: new consult    HISTORICAL  CHIEF COMPLAINT:  Chief Complaint  Patient presents with  . Headache    Rm 6 New Pt "headaches with blurred vision x 3 months, hx of monthly migraines that went away, came back with migraine on left side of head with blurry vision, frequency is sporadic; tried Tylenol, Aleve, tegretol which helped at first; has tried/failed many meds"    HISTORY OF PRESENT ILLNESS:   50 year old female here for evaluation of headaches.  Patient has history of headaches since childhood with generalized global throbbing headaches, nausea, photophobia symptoms.  Symptoms continued throughout her life.  Had worsening symptoms in 2018 and saw neurologist for evaluation.  Symptoms continue to fluctuate and have worsened in the last 4 to 6 months.  Now headaches affecting left side with nausea, sensitive to light and sound and smell.  She has tried writing medications including sumatriptan and, Topamax, venlafaxine, amlodipine, allergy medicines, ibuprofen, Tylenol, and more recently carbamazepine without relief.  Also with history of lupus anticoagulant, DVTs and PE.  On Coumadin.   REVIEW OF SYSTEMS: Full 14 system review of systems performed and negative with exception of: as per HPI.   ALLERGIES: Allergies  Allergen Reactions  . Camphor   . Folic Acid Hives  . Megace [Megestrol] Other (See Comments)    Pt is s/p PE and DVT on low dose Megace  . Menthol   . Other Other (See Comments)  . Adhesive [Tape] Other (See Comments)    Skin Burn.  [name] [Menthol (Topical Analgesic)] Other (See Comments)    Skin Burn.  . Sulfa Antibiotics Rash    HOME MEDICATIONS: Outpatient Medications Prior to Visit  Medication Sig Dispense Refill  . acetaminophen (TYLENOL) 325 MG tablet Take 2 tablets (650 mg total) by  mouth every 6 (six) hours as needed for mild pain (or Fever >/= 101). 20 tablet 0  . ALPRAZolam (XANAX) 0.25 MG tablet TAKE 1 TABLET BY MOUTH TWICE DAILY AS NEEDED FOR ANXIETY AND FOR SLEEP *DO  NOT  TAKE  WITH  HYDROCODONE* 30 tablet 0  . amLODipine (NORVASC) 5 MG tablet Take 1 tablet (5 mg total) by mouth daily. (Patient taking differently: Take 5 mg by mouth every evening.) 90 tablet 1  . azelastine (ASTELIN) 0.1 % nasal spray Place 1 spray into both nostrils 2 (two) times daily. Use in each nostril as directed 30 mL 5  . carbamazepine (TEGRETOL XR) 100 MG 12 hr tablet Take 1 tablet by mouth twice daily (Patient taking differently: Take 100 mg by mouth every evening.) 60 tablet 0  . fluticasone (FLONASE) 50 MCG/ACT nasal spray Place 2 sprays into both nostrils daily. (Patient taking differently: Place 2 sprays into both nostrils daily as needed for allergies.) 16 g 6  . levothyroxine (SYNTHROID) 100 MCG tablet Take 1 tablet (100 mcg total) by mouth daily. (Patient taking differently: Take 100 mcg by mouth every evening.) 90 tablet 0  . lisinopril (ZESTRIL) 10 MG tablet Take 1 tablet (10 mg total) by mouth daily. (Patient taking differently: Take 10 mg by mouth every evening.) 90 tablet 1  . montelukast (SINGULAIR) 10 MG tablet Take 1 tablet (10 mg total) by mouth at bedtime. 90 tablet 1  . ondansetron (ZOFRAN) 4 MG tablet Take 1 tablet (4 mg total) by mouth every 6 (six) hours as needed for nausea.  20 tablet 0  . oxybutynin (DITROPAN-XL) 5 MG 24 hr tablet Take 1 tablet (5 mg total) by mouth at bedtime. 30 tablet 5  . pantoprazole (PROTONIX) 40 MG tablet Take 1 tablet (40 mg total) by mouth 2 (two) times daily. 60 tablet 0  . promethazine-dextromethorphan (PROMETHAZINE-DM) 6.25-15 MG/5ML syrup Take 5 mLs by mouth 4 (four) times daily as needed for cough. 240 mL 0  . senna-docusate (SENOKOT-S) 8.6-50 MG tablet Take 1 tablet by mouth at bedtime as needed for mild constipation. 30 tablet 0  . sertraline  (ZOLOFT) 100 MG tablet Take 1 tablet (100 mg total) by mouth daily. (Patient taking differently: Take 100 mg by mouth at bedtime.) 90 tablet 1  . traZODone (DESYREL) 100 MG tablet Take 1 tablet (100 mg total) by mouth at bedtime. 90 tablet 1  . warfarin (COUMADIN) 5 MG tablet Take 1 tab by mouth once daily and 1.5 tabs on Fridays and Tuesdays 90 tablet 0   No facility-administered medications prior to visit.    PAST MEDICAL HISTORY: Past Medical History:  Diagnosis Date  . Anxiety   . Back pain   . Chest pain    a. 07/2015 Myoview: EF 71%, medium size, mild intensity, partially reversible septal defect with overlying breast attenuation -> likely artifact. No significant reversible ischemia.  . Constipation   . Depression   . Edema    feet and legs  . Frequent headaches   . GERD (gastroesophageal reflux disease)   . History of DVT (deep vein thrombosis) 2018  . Hypertension   . Hypothyroidism   . Insulin resistance   . Joint pain   . Migraines   . OSA (obstructive sleep apnea) 03/17/2016  . Osteoarthritis   . Panic anxiety syndrome   . Pulmonary embolism ([place]) 2022  . SOB (shortness of breath)    a. 04/2016 Echo: EF 60-65%, Gr1 DD.  [name] Swallowing difficulty     PAST SURGICAL HISTORY: Past Surgical History:  Procedure Laterality Date  . CESAREAN SECTION  2001 & 2002  . ELBOW SURGERY    . MINOR CARPAL TUNNEL     pinched nerve in elbow  . WISDOM TOOTH EXTRACTION      FAMILY HISTORY: Family History  Problem Relation Age of Onset  . Hypertension Mother        Living  . Arthritis Mother   . Thyroid disease Mother   . Heart disease Mother        MI at age 37  . Heart attack Mother   . Migraines Mother   . Depression Mother   . Obesity Mother   . Congestive Heart Failure Father        ?CHF  . Hypertension Maternal Grandmother   . Parkinson's disease Maternal Grandmother   . Alzheimer's disease Maternal Grandmother   . Cancer Maternal Grandfather   . Hypertension  Maternal Grandfather   . Hypertension Paternal Grandmother   . Hypertension Paternal Grandfather   . Gout Brother   . ADD / ADHD Son        x1  . Other Son        #2-Unknown  . Diabetes Neg Hx     SOCIAL HISTORY: Social History   Socioeconomic History  . Marital status: Single    Spouse name: Not on file  . Number of children: 2  . Years of education: Not on file  . Highest education level: Associate degree: occupational, Hotel manager, or vocational program  Occupational History  .  Occupation: Assembler    Comment: [employer]  Tobacco Use  . Smoking status: Former Smoker    Types: Cigarettes    Quit date: 1992    Years since quitting: 30.4  . Smokeless tobacco: Never Used  . Tobacco comment: quit 1992  Substance and Sexual Activity  . Alcohol use: No    Alcohol/week: 0.0 standard drinks  . Drug use: No  . Sexual activity: Yes    Partners: Male    Birth control/protection: None  Other Topics Concern  . Not on file  Social History Narrative   She has 2 children (grown). She did not retain custody of these children.   Lives with boyfriend in [place].   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 Wt Readings from Last 3 Encounters:  07/14/20 296 lb 3.2 oz (134.4 kg)  07/10/20 296 lb 6.4 oz (134.4 kg)  06/16/20 293 lb 3.2 oz (133 kg)     Patient is in no distress; well developed, nourished and groomed; neck is supple  CARDIOVASCULAR:  Examination of carotid arteries is  normal; no carotid bruits  Regular rate and rhythm, no murmurs  Examination of peripheral vascular system by observation and palpation is normal  EYES:  Ophthalmoscopic exam of optic discs and posterior segments is normal; no papilledema or hemorrhages  No exam data present  MUSCULOSKELETAL:  Gait, strength, tone, movements noted in Neurologic exam below  NEUROLOGIC: MENTAL STATUS:  No flowsheet data found.  awake, alert, oriented to person, place and time  recent and remote memory intact  normal attention and concentration  language fluent, comprehension intact, naming intact  fund of knowledge appropriate  CRANIAL NERVE:   2nd - no papilledema on fundoscopic exam  2nd, 3rd, 4th, 6th - pupils equal and reactive to light, visual fields full to confrontation, extraocular muscles intact, no nystagmus  5th - facial sensation symmetric  7th - facial strength symmetric  8th - hearing intact  9th - palate elevates symmetrically, uvula midline  11th - shoulder shrug symmetric  12th - tongue protrusion midline  MOTOR:   normal bulk and tone, full strength in the BUE, BLE  SENSORY:   normal and symmetric to light touch, temperature, vibration  COORDINATION:   finger-nose-finger, fine finger movements normal  REFLEXES:   deep tendon reflexes present and symmetric  GAIT/STATION:   narrow based gait     DIAGNOSTIC DATA (LABS, IMAGING, TESTING) - I reviewed patient records, labs, notes, testing and imaging myself where available.  Lab Results  Component Value Date   WBC 6.0 05/29/2020   HGB 12.0 05/29/2020   HCT 35.1 (L) 05/29/2020   MCV 84.2 05/29/2020   PLT 290 05/29/2020      Component Value Date/Time   NA 135 05/29/2020 1505   NA 138 03/08/2017 1108   K 3.8 05/29/2020 1505   CL 103 05/29/2020 1505   CO2 25 05/29/2020 1505   GLUCOSE  95 05/29/2020 1505   BUN 15 05/29/2020 1505   BUN 17 03/08/2017 1108   CREATININE 0.80 05/29/2020 1505    CREATININE 0.83 04/21/2020 0908   CALCIUM 9.5 05/29/2020 1505   PROT 7.1 05/29/2020 1505   PROT 6.5 03/08/2017 1108   ALBUMIN 4.0 05/29/2020 1505   ALBUMIN 3.7 03/08/2017 1108   AST 11 (L) 05/29/2020 1505   ALT 15 05/29/2020 1505   ALKPHOS 65 05/29/2020 1505   BILITOT 0.3 05/29/2020 1505   GFRNONAA >60 05/29/2020 1505   GFRAA >60 09/06/2019 1641   Lab Results  Component Value Date   CHOL 143 09/02/2019   HDL 40.60 09/02/2019   LDLCALC 86 09/02/2019   TRIG 81.0 09/02/2019   CHOLHDL 4 09/02/2019   Lab Results  Component Value Date   HGBA1C 4.8 05/16/2020   Lab Results  Component Value Date   LAGTXMIW80 321 03/10/2016   Lab Results  Component Value Date   TSH 5.117 (H) 05/15/2020    03/25/16 CT head  - No acute intracranial pathology.    ASSESSMENT AND PLAN  50 y.o. year old female here with migraine without aura.    Meds tried: sumatriptan, topiramate, carbamazepine, venlafaxine, citalopram, amplodipine, excedrin, ibuprofen, naproxen   Dx:  1. Migraine without aura and without status migrainosus, not intractable   2. New onset headache      PLAN:  MIGRAINE WITHOUT AURA (4-6 per month) - stop carbamazepine 100mg  twice a day; not that effective; ok to stop; will help patient to be candidate for alternate anti-coagulants - start topiramate 50mg  at bedtime; increase to twice a day after 1-2 weeks - start rizatriptan 10mg  as needed for migraine rescue - may consider CGRP antagonists (prevention and rescue)  CHANGE IN HEADACHE TYPE (starting 2022) - could be change in migraine quality, but recommend CT head to rule out other causes (has piercing that cannot be removed so cannot have MRI)  LUPUS ANTI-COAGULANT / DVT / PE - continue coumadin   Orders Placed This Encounter  Procedures  . CT HEAD WO CONTRAST    Meds ordered this encounter  Medications  . topiramate (TOPAMAX) 50 MG tablet    Sig: Take 1 tablet (50 mg total) by mouth 2 (two) times  daily.    Dispense:  60 tablet    Refill:  12  . rizatriptan (MAXALT) 10 MG tablet    Sig: As needed for headache; may repeat in 2 hours if needed; max 2 per day or 8 per month    Dispense:  8 tablet    Refill:  6    Return in about 6 months (around 01/13/2021) for with NP (Amy Lomax).    Penni Bombard, MD 03/12/4823, 0:03 AM Certified in Neurology, Neurophysiology and Neuroimaging  [facility] [address], [place], [place] [address] [phone]

## 2020-07-15 ENCOUNTER — Encounter: Payer: Self-pay | Admitting: Family

## 2020-07-15 DIAGNOSIS — M2550 Pain in unspecified joint: Secondary | ICD-10-CM | POA: Insufficient documentation

## 2020-07-15 DIAGNOSIS — K5900 Constipation, unspecified: Secondary | ICD-10-CM | POA: Insufficient documentation

## 2020-07-15 DIAGNOSIS — M1990 Unspecified osteoarthritis, unspecified site: Secondary | ICD-10-CM | POA: Insufficient documentation

## 2020-07-15 DIAGNOSIS — E039 Hypothyroidism, unspecified: Secondary | ICD-10-CM | POA: Insufficient documentation

## 2020-07-15 DIAGNOSIS — M549 Dorsalgia, unspecified: Secondary | ICD-10-CM | POA: Insufficient documentation

## 2020-07-15 DIAGNOSIS — R609 Edema, unspecified: Secondary | ICD-10-CM | POA: Insufficient documentation

## 2020-07-15 DIAGNOSIS — R1310 Dysphagia, unspecified: Secondary | ICD-10-CM | POA: Insufficient documentation

## 2020-07-15 DIAGNOSIS — R0602 Shortness of breath: Secondary | ICD-10-CM | POA: Insufficient documentation

## 2020-07-15 DIAGNOSIS — F419 Anxiety disorder, unspecified: Secondary | ICD-10-CM | POA: Insufficient documentation

## 2020-07-15 DIAGNOSIS — R519 Headache, unspecified: Secondary | ICD-10-CM | POA: Insufficient documentation

## 2020-07-15 DIAGNOSIS — F410 Panic disorder [episodic paroxysmal anxiety] without agoraphobia: Secondary | ICD-10-CM | POA: Insufficient documentation

## 2020-07-15 DIAGNOSIS — R079 Chest pain, unspecified: Secondary | ICD-10-CM | POA: Insufficient documentation

## 2020-07-15 NOTE — Telephone Encounter (Signed)
Sharon Rivers, can we see if we can change her from a nurse visit to a visit with me on Friday please?

## 2020-07-15 NOTE — Telephone Encounter (Signed)
Dear Sharon Rivers/Dr. Marin Olp,  This pt is requesting to switch to another anticoagulant. I suspect coumadin was chosen because of potential drug interaction between Xarelto and tegretol.  Tegretol has been changed to topamax.    I have 3 questions please:  1) Is it OK to switch her to xarelto?   2) If we switch her to xarelto from coumadin do we dose 15 mg twice daily with food for 21 days followed by 20 mg once daily. 3) is the plan lifelong anticoagulation for her?   Thanks!

## 2020-07-16 ENCOUNTER — Other Ambulatory Visit: Payer: Self-pay

## 2020-07-16 NOTE — Telephone Encounter (Signed)
Appointment moved to provider's schedule at 3 pm. Patient advised of change

## 2020-07-17 ENCOUNTER — Ambulatory Visit (INDEPENDENT_AMBULATORY_CARE_PROVIDER_SITE_OTHER): Payer: 59 | Admitting: Family

## 2020-07-17 ENCOUNTER — Ambulatory Visit: Payer: 59

## 2020-07-17 ENCOUNTER — Telehealth: Payer: Self-pay | Admitting: Family

## 2020-07-17 VITALS — BP 141/84 | HR 89 | Temp 98.8°F | Resp 16 | Wt 295.0 lb

## 2020-07-17 DIAGNOSIS — I2699 Other pulmonary embolism without acute cor pulmonale: Secondary | ICD-10-CM

## 2020-07-17 DIAGNOSIS — E039 Hypothyroidism, unspecified: Secondary | ICD-10-CM

## 2020-07-17 LAB — POCT INR: INR: 1.8 — AB (ref 2.0–3.0)

## 2020-07-17 MED ORDER — RIVAROXABAN (XARELTO) VTE STARTER PACK (15 & 20 MG): ORAL_TABLET | ORAL | 0 refills | Status: DC

## 2020-07-17 MED ORDER — BENZONATATE 100 MG PO CAPS: 100.0000 mg | ORAL_CAPSULE | Freq: Three times a day (TID) | ORAL | 0 refills | Status: DC | PRN

## 2020-07-17 NOTE — Progress Notes (Signed)
Subjective:   By signing my name below, I, [name], attest that this documentation has been prepared under the direction and in the presence of [name] NP. 07/17/2020    Patient ID: Sharon Rivers, female    DOB: 1970/06/12, 50 y.o.   MRN: [id number]  Chief Complaint  Patient presents with   Pulmonary embolism    Her for follow up, INR today is 1.8     HPI Patient is in today for a follow up visit. She is requesting a INR check. Her INR measured 1.8 during this visit. She wishes to transition to [place] for convenience.  Her hematologist has approved this change. She has stopped taking 100 mg tegretol XR daily PO at the direction of her neurologist and instead she was placed on 50 mg Topamax 2x daily PO. She continues to have some SOB with increased activity, but it has improved since her last visit.   She is requesting to have her thyroid level checked during this visit.   Health Maintenance Due  Topic Date Due   COLONOSCOPY (Pts 45-40yrs Insurance coverage will need to be confirmed)  Never done    Past Medical History:  Diagnosis Date   Anxiety    Back pain    Chest pain    a. 07/2015 Myoview: EF 71%, medium size, mild intensity, partially reversible septal defect with overlying breast attenuation -> likely artifact. No significant reversible ischemia.   Constipation    Depression    Edema    feet and legs   Frequent headaches    GERD (gastroesophageal reflux disease)    History of DVT (deep vein thrombosis) 2018   Hypertension    Hypothyroidism    Insulin resistance    Joint pain    Migraines    OSA (obstructive sleep apnea) 03/17/2016   Osteoarthritis    Panic anxiety syndrome    Pulmonary embolism ([place]) 2022   SOB (shortness of breath)    a. 04/2016 Echo: EF 60-65%, Gr1 DD.   Swallowing difficulty     Past Surgical History:  Procedure Laterality Date   CESAREAN SECTION  2001 & 2002   ELBOW SURGERY     MINOR CARPAL TUNNEL     pinched nerve in  elbow   WISDOM TOOTH EXTRACTION      Family History  Problem Relation Age of Onset   Hypertension Mother        Living   Arthritis Mother    Thyroid disease Mother    Heart disease Mother        MI at age 49   Heart attack Mother    Migraines Mother    Depression Mother    Obesity Mother    Congestive Heart Failure Father        ?CHF   Hypertension Maternal Grandmother    Parkinson's disease Maternal Grandmother    Alzheimer's disease Maternal Grandmother    Cancer Maternal Grandfather    Hypertension Maternal Grandfather    Hypertension Paternal Grandmother    Hypertension Paternal Grandfather    Gout Brother    ADD / ADHD Son        x1   Other Son        #2-Unknown   Diabetes Neg Hx     Social History   Socioeconomic History   Marital status: Single    Spouse name: Not on file   Number of children: 2   Years of education: Not on file   Highest  education level: Associate degree: occupational, Hotel manager, or vocational program  Occupational History   Occupation: Assembler    Comment: [employer]  Tobacco Use   Smoking status: Former    Pack years: 0.00    Types: Cigarettes    Quit date: 1992    Years since quitting: 30.4   Smokeless tobacco: Never   Tobacco comments:    quit 1992  Substance and Sexual Activity   Alcohol use: No    Alcohol/week: 0.0 standard drinks   Drug use: No   Sexual activity: Yes    Partners: Male    Birth control/protection: None  Other Topics Concern   Not on file  Social History Narrative   She has 2 children (grown). She did not retain custody of these children.   Lives with boyfriend in [place].   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 20 tablet 0   ALPRAZolam (XANAX) 0.25 MG tablet TAKE 1 TABLET BY MOUTH TWICE DAILY AS NEEDED FOR ANXIETY AND FOR SLEEP *DO  NOT  TAKE  WITH  HYDROCODONE* 30 tablet 0   amLODipine (NORVASC) 5 MG tablet Take 1 tablet (5 mg total) by mouth daily. (Patient taking differently: Take 5 mg by mouth every evening.) 90 tablet 1   azelastine (ASTELIN) 0.1 % nasal spray Place 1 spray into both nostrils 2 (two) times daily. Use in each nostril as directed 30 mL 5   fluticasone (FLONASE) 50 MCG/ACT nasal spray Place 2 sprays into both nostrils daily. (Patient taking differently: Place 2 sprays into both nostrils daily as needed for allergies.) 16 g 6   levothyroxine (SYNTHROID) 100 MCG tablet Take 1 tablet (100 mcg total) by mouth daily. (Patient taking differently: Take 100 mcg by mouth every evening.) 90 tablet 0   montelukast (SINGULAIR) 10 MG tablet Take 1 tablet (10 mg total) by mouth at bedtime. 90 tablet 1   ondansetron (ZOFRAN) 4 MG tablet Take 1 tablet (4 mg total) by mouth every 6 (six) hours as needed for nausea. 20 tablet 0   oxybutynin (DITROPAN-XL) 5 MG 24 hr tablet Take 1 tablet (5 mg total) by mouth at bedtime. 30 tablet 5   pantoprazole (PROTONIX) 40 MG tablet Take 1 tablet (40 mg total) by mouth 2 (two) times daily. 60 tablet 0   promethazine-dextromethorphan (PROMETHAZINE-DM) 6.25-15 MG/5ML syrup Take 5 mLs by mouth 4 (four) times daily as needed for cough. 240 mL 0   rizatriptan (MAXALT) 10 MG tablet As needed for headache; may repeat in 2 hours if needed; max 2 per day or 8 per month 8 tablet 6   senna-docusate (SENOKOT-S) 8.6-50 MG tablet Take 1 tablet by mouth at bedtime as needed for mild constipation. 30 tablet 0   sertraline (ZOLOFT) 100 MG tablet Take 1 tablet (100 mg  total) by mouth daily. (Patient taking differently: Take 100 mg by mouth at bedtime.) 90 tablet 1   topiramate (TOPAMAX) 50 MG tablet Take 1 tablet (  50 mg total) by mouth 2 (two) times daily. 60 tablet 12   traZODone (DESYREL) 100 MG tablet Take 1 tablet (100 mg total) by mouth at bedtime. 90 tablet 1   carbamazepine (TEGRETOL XR) 100 MG 12 hr tablet Take 1 tablet by mouth twice daily (Patient taking differently: Take 100 mg by mouth every evening.) 60 tablet 0   lisinopril (ZESTRIL) 10 MG tablet Take 1 tablet (10 mg total) by mouth daily. (Patient taking differently: Take 10 mg by mouth every evening.) 90 tablet 1   warfarin (COUMADIN) 5 MG tablet Take 1 tab by mouth once daily and 1.5 tabs on Fridays and Tuesdays 90 tablet 0   No facility-administered medications prior to visit.    Allergies  Allergen Reactions   Camphor    Folic Acid Hives   Lisinopril Cough   Megace [Megestrol] Other (See Comments)    Pt is s/p PE and DVT on low dose Megace   Menthol    Other Other (See Comments)   Adhesive [Tape] Other (See Comments)    Skin Burn.   Biofreeze [Menthol (Topical Analgesic)] Other (See Comments)    Skin Burn.   Sulfa Antibiotics Rash    Review of Systems  Respiratory:  Positive for shortness of breath.       Objective:    Physical Exam Constitutional:      General: She is not in acute distress.    Appearance: Normal appearance. She is not ill-appearing.  HENT:     Head: Normocephalic and atraumatic.     Right Ear: External ear normal.     Left Ear: External ear normal.  Eyes:     Extraocular Movements: Extraocular movements intact.     Pupils: Pupils are equal, round, and reactive to light.  Cardiovascular:     Rate and Rhythm: Normal rate and regular rhythm.     Pulses: Normal pulses.     Heart sounds: Normal heart sounds. No murmur heard.   No gallop.  Pulmonary:     Effort: Pulmonary effort is normal. No respiratory distress.     Breath sounds: Normal breath  sounds. No wheezing, rhonchi or rales.  Skin:    General: Skin is warm and dry.  Neurological:     Mental Status: She is alert and oriented to person, place, and time.  Psychiatric:        Behavior: Behavior normal.    BP (!) 141/84 (BP Location: Right Arm, Patient Position: Sitting, Cuff Size: Large)   Pulse 89   Temp 98.8 F (37.1 C) (Oral)   Resp 16   Wt 295 lb (133.8 kg)   SpO2 99%   BMI 53.96 kg/m  Wt Readings from Last 3 Encounters:  07/20/20 295 lb (133.8 kg)  07/17/20 295 lb (133.8 kg)  07/14/20 296 lb 3.2 oz (134.4 kg)       Assessment & Plan:   Problem List Items Addressed This Visit       Unprioritized   Pulmonary embolism and infarction ([place]) - Primary   Relevant Medications   RIVAROXABAN (XARELTO) VTE STARTER PACK (15 & 20 MG)   Other Relevant Orders   POCT INR (Completed)   Pulmonary embolism ([facility])    Will d/c coumadin, pt will start xarelto 15mg  bid for 21 days then transition to 20mg  once daily on day 22.  She will call before she runs out and we will send the continuation dose pack. Plan is for lifelong anticoagulation per hematology.  Relevant Medications   RIVAROXABAN (XARELTO) VTE STARTER PACK (15 & 20 MG)   Hypothyroidism   Relevant Orders   TSH (Completed)     Meds ordered this encounter  Medications   RIVAROXABAN (XARELTO) VTE STARTER PACK (15 & 20 MG)    Sig: Follow package directions: Take one 15mg  tablet by mouth twice a day. On day 22, switch to one 20mg  tablet once a day. Take with food.    Dispense:  51 each    Refill:  0    Order Specific Question:   Supervising Provider    Answer:   Penni Homans A [[id number]]    I, [name] NP, personally preformed the services described in this documentation.  All medical record entries made by the scribe were at my direction and in my presence.  I have reviewed the chart and discharge instructions (if applicable) and agree that the record reflects my personal performance and is  accurate and complete. 07/17/2020   I,[name],acting as a Education administrator for [name], NP.,have documented all relevant documentation on the behalf of [name], NP,as directed by  [name], NP while in the presence of [name], NP.   [name], NP

## 2020-07-17 NOTE — Patient Instructions (Signed)
Stop coumadin.  Begin Xarelto 15mg  twice daily for 21 days, then change to 20mg  once daily on day 22.  Let me know before you run out and we can send the continuation dose.

## 2020-07-18 LAB — TSH: TSH: 3.52 mIU/L

## 2020-07-20 ENCOUNTER — Other Ambulatory Visit: Payer: Self-pay

## 2020-07-20 ENCOUNTER — Encounter: Payer: Self-pay | Admitting: Family

## 2020-07-20 ENCOUNTER — Ambulatory Visit (INDEPENDENT_AMBULATORY_CARE_PROVIDER_SITE_OTHER): Payer: 59 | Admitting: Cardiology

## 2020-07-20 ENCOUNTER — Encounter: Payer: Self-pay | Admitting: Cardiology

## 2020-07-20 VITALS — BP 136/104 | HR 85 | Ht 62.0 in | Wt 295.0 lb

## 2020-07-20 DIAGNOSIS — G4733 Obstructive sleep apnea (adult) (pediatric): Secondary | ICD-10-CM | POA: Diagnosis not present

## 2020-07-20 DIAGNOSIS — I5189 Other ill-defined heart diseases: Secondary | ICD-10-CM | POA: Diagnosis not present

## 2020-07-20 DIAGNOSIS — I10 Essential (primary) hypertension: Secondary | ICD-10-CM | POA: Diagnosis not present

## 2020-07-20 MED ORDER — VALSARTAN 40 MG PO TABS: 40.0000 mg | ORAL_TABLET | Freq: Every day | ORAL | 3 refills | Status: DC

## 2020-07-20 NOTE — Progress Notes (Signed)
Cardiology Office Note:    Date:  07/20/2020   ID:  Sharon Rivers, DOB 1970/07/18, MRN [id number]  PCP:  [name], NP  Cardiologist:  Berniece Salines, DO  Electrophysiologist:  None   Referring MD: [name], NP   No chief complaint on file. " I was asked to see cardiology because something was wrong with my heart since I had the blood clot in the lungs"  History of Present Illness:    Sharon Rivers is a 50 y.o. female with a hx of history of DVTs with recent pulmonary embolism on Xarelto, morbid obesity, hypertension, OSA not on CPAP she is pending delivery of her CPAP which has been prolonged due to CPAP back order, hypothyroidism is here to establish cardiac care.  Today the patient tells me that she was asked by her PCP to see cardiology because the pulmonary embolism affected her heart.  She does report that she has had some intermittent shortness of breath and tells me that the chest pain that she experienced when she was first diagnosed with pulmonary embolus and is improving but not fully resolved. She denies any lightheadedness or dizziness.  Of note she reports that she has had some coughing which may be related to the lisinopril.  Past Medical History:  Diagnosis Date   Anxiety    Back pain    Chest pain    a. 07/2015 Myoview: EF 71%, medium size, mild intensity, partially reversible septal defect with overlying breast attenuation -> likely artifact. No significant reversible ischemia.   Constipation    Depression    Edema    feet and legs   Frequent headaches    GERD (gastroesophageal reflux disease)    History of DVT (deep vein thrombosis) 2018   Hypertension    Hypothyroidism    Insulin resistance    Joint pain    Migraines    OSA (obstructive sleep apnea) 03/17/2016   Osteoarthritis    Panic anxiety syndrome    Pulmonary embolism ([place]) 2022   SOB (shortness of breath)    a. 04/2016 Echo: EF 60-65%, Gr1 DD.   Swallowing difficulty     Past  Surgical History:  Procedure Laterality Date   CESAREAN SECTION  2001 & 2002   ELBOW SURGERY     MINOR CARPAL TUNNEL     pinched nerve in elbow   WISDOM TOOTH EXTRACTION      Current Medications: Current Meds  Medication Sig   acetaminophen (TYLENOL) 325 MG tablet Take 2 tablets (650 mg total) by mouth every 6 (six) hours as needed for mild pain (or Fever >/= 101).   ALPRAZolam (XANAX) 0.25 MG tablet TAKE 1 TABLET BY MOUTH TWICE DAILY AS NEEDED FOR ANXIETY AND FOR SLEEP *DO  NOT  TAKE  WITH  HYDROCODONE*   amLODipine (NORVASC) 5 MG tablet Take 1 tablet (5 mg total) by mouth daily. (Patient taking differently: Take 5 mg by mouth every evening.)   azelastine (ASTELIN) 0.1 % nasal spray Place 1 spray into both nostrils 2 (two) times daily. Use in each nostril as directed   fluticasone (FLONASE) 50 MCG/ACT nasal spray Place 2 sprays into both nostrils daily. (Patient taking differently: Place 2 sprays into both nostrils daily as needed for allergies.)   levothyroxine (SYNTHROID) 100 MCG tablet Take 1 tablet (100 mcg total) by mouth daily. (Patient taking differently: Take 100 mcg by mouth every evening.)   montelukast (SINGULAIR) 10 MG tablet Take 1 tablet (10 mg total) by mouth at  bedtime.   ondansetron (ZOFRAN) 4 MG tablet Take 1 tablet (4 mg total) by mouth every 6 (six) hours as needed for nausea.   oxybutynin (DITROPAN-XL) 5 MG 24 hr tablet Take 1 tablet (5 mg total) by mouth at bedtime.   pantoprazole (PROTONIX) 40 MG tablet Take 1 tablet (40 mg total) by mouth 2 (two) times daily.   promethazine-dextromethorphan (PROMETHAZINE-DM) 6.25-15 MG/5ML syrup Take 5 mLs by mouth 4 (four) times daily as needed for cough.   RIVAROXABAN (XARELTO) VTE STARTER PACK (15 & 20 MG) Follow package directions: Take one 15mg  tablet by mouth twice a day. On day 22, switch to one 20mg  tablet once a day. Take with food.   rizatriptan (MAXALT) 10 MG tablet As needed for headache; may repeat in 2 hours if needed;  max 2 per day or 8 per month   senna-docusate (SENOKOT-S) 8.6-50 MG tablet Take 1 tablet by mouth at bedtime as needed for mild constipation.   sertraline (ZOLOFT) 100 MG tablet Take 1 tablet (100 mg total) by mouth daily. (Patient taking differently: Take 100 mg by mouth at bedtime.)   topiramate (TOPAMAX) 50 MG tablet Take 1 tablet (50 mg total) by mouth 2 (two) times daily.   traZODone (DESYREL) 100 MG tablet Take 1 tablet (100 mg total) by mouth at bedtime.   valsartan (DIOVAN) 40 MG tablet Take 1 tablet (40 mg total) by mouth daily.   [DISCONTINUED] lisinopril (ZESTRIL) 10 MG tablet Take 1 tablet (10 mg total) by mouth daily. (Patient taking differently: Take 10 mg by mouth every evening.)     Allergies:   Camphor, Folic acid, Lisinopril, Megace [megestrol], Menthol, Other, Adhesive [tape], Biofreeze [menthol (topical analgesic)], and Sulfa antibiotics   Social History   Socioeconomic History   Marital status: Single    Spouse name: Not on file   Number of children: 2   Years of education: Not on file   Highest education level: Associate degree: occupational, Hotel manager, or vocational program  Occupational History   Occupation: Loss adjuster, chartered    Comment: [employer]  Tobacco Use   Smoking status: Former    Pack years: 0.00    Types: Cigarettes    Quit date: 1992    Years since quitting: 30.4   Smokeless tobacco: Never   Tobacco comments:    quit 1992  Substance and Sexual Activity   Alcohol use: No    Alcohol/week: 0.0 standard drinks   Drug use: No   Sexual activity: Yes    Partners: Male    Birth control/protection: None  Other Topics Concern   Not on file  Social History Narrative   She has 2 children (grown). She did not retain custody of these children.   Lives with boyfriend in [place].   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   Social Determinants of Radio broadcast assistant Strain: Not on [employer] Insecurity: Not on file  Transportation  Needs: Not on file  Physical Activity: Not on file  Stress: Not on file  Social Connections: Not on file     Family History: The patient's family history includes ADD / ADHD in her son; Alzheimer's disease in her maternal grandmother; Arthritis in her mother; Cancer in her maternal grandfather; Congestive Heart Failure in her father; Depression in her mother; Gout in her brother; Heart attack in her mother; Heart disease in her mother; Hypertension in her maternal grandfather, maternal grandmother, mother, paternal grandfather, and paternal grandmother; Migraines in her mother;  Obesity in her mother; Other in her son; Parkinson's disease in her maternal grandmother; Thyroid disease in her mother. There is no history of Diabetes.  ROS:   Review of Systems  Constitution: Negative for decreased appetite, fever and weight gain.  HENT: Negative for congestion, ear discharge, hoarse voice and sore throat.   Eyes: Negative for discharge, redness, vision loss in right eye and visual halos.  Cardiovascular: Negative for chest pain, dyspnea on exertion, leg swelling, orthopnea and palpitations.  Respiratory: Negative for cough, hemoptysis, shortness of breath and snoring.   Endocrine: Negative for heat intolerance and polyphagia.  Hematologic/Lymphatic: Negative for bleeding problem. Does not bruise/bleed easily.  Skin: Negative for flushing, nail changes, rash and suspicious lesions.  Musculoskeletal: Negative for arthritis, joint pain, muscle cramps, myalgias, neck pain and stiffness.  Gastrointestinal: Negative for abdominal pain, bowel incontinence, diarrhea and excessive appetite.  Genitourinary: Negative for decreased libido, genital sores and incomplete emptying.  Neurological: Negative for brief paralysis, focal weakness, headaches and loss of balance.  Psychiatric/Behavioral: Negative for altered mental status, depression and suicidal ideas.  Allergic/Immunologic: Negative for HIV exposure and  persistent infections.    EKGs/Labs/Other Studies Reviewed:    The following studies were reviewed today:   EKG:  The ekg ordered today demonstrates sinus rhythm, heart rate 85 bpm.  TTE 05/13/2020 IMPRESSIONS   1. Left ventricular ejection fraction, by estimation, is 60 to 65%. The left ventricle has normal function. The left ventricle has no regional wall motion abnormalities. There is mild left ventricular hypertrophy. Left ventricular diastolic parameters  are consistent with Grade I diastolic dysfunction (impaired relaxation).  2. Right ventricular systolic function is normal. The right ventricular size is normal.   3. The mitral valve is grossly normal. No evidence of mitral valve regurgitation.   4. The aortic valve was not well visualized. Aortic valve regurgitation is not visualized.  Conclusion(s)/Recommendation(s): No evidence of right heart strain.   Recent Labs: 05/12/2020: B Natriuretic Peptide 7.5 05/18/2020: Magnesium 2.2 05/29/2020: ALT 15; BUN 15; Creatinine 0.80; Hemoglobin 12.0; Platelet Count 290; Potassium 3.8; Sodium 135 07/17/2020: TSH 3.52  Recent Lipid Panel    Component Value Date/Time   CHOL 143 09/02/2019 0904   CHOL 158 03/08/2017 1108   TRIG 81.0 09/02/2019 0904   HDL 40.60 09/02/2019 0904   HDL 45 03/08/2017 1108   CHOLHDL 4 09/02/2019 0904   VLDL 16.2 09/02/2019 0904   LDLCALC 86 09/02/2019 0904   LDLCALC 100 (H) 03/08/2017 1108    Physical Exam:    VS:  BP (!) 136/104 (BP Location: Right Arm)   Pulse 85   Ht 5\' 2"  (1.575 m)   Wt 295 lb (133.8 kg)   SpO2 98%   BMI 53.96 kg/m     Wt Readings from Last 3 Encounters:  07/20/20 295 lb (133.8 kg)  07/17/20 295 lb (133.8 kg)  07/14/20 296 lb 3.2 oz (134.4 kg)     GEN: Well nourished, well developed in no acute distress HEENT: Normal NECK: No JVD; No carotid bruits LYMPHATICS: No lymphadenopathy CARDIAC: S1S2 noted,RRR, no murmurs, rubs, gallops RESPIRATORY:  Clear to auscultation without  rales, wheezing or rhonchi  ABDOMEN: Soft, non-tender, non-distended, +bowel sounds, no guarding. EXTREMITIES: No edema, No cyanosis, no clubbing MUSCULOSKELETAL:  No deformity  SKIN: Warm and dry NEUROLOGIC:  Alert and oriented x 3, non-focal PSYCHIATRIC:  Normal affect, good insight  ASSESSMENT:    1. Hypertension, unspecified type   2. OSA (obstructive sleep apnea)   3.  Grade I diastolic dysfunction   4. Morbid obesity ([place])    PLAN:    She is hypertensive in the office today blood pressure which was manually taken.  I like to demised her antihypertensive medication.  With first given the fact that she is experiencing coughing on lisinopril like to stop this medication and start her on valsartan 40 mg daily.  Take her blood pressure daily for the next 2 weeks she does be greater than 130/80 I will add hydrochlorothiazide 12.5 mg.  She is pending CPAP.  Echocardiogram did not show any right heart strain from a pulmonary embolism, she does have evidence of impaired relaxation.  Educated the patient what this means with her grade 1 diastolic dysfunction.  The patient is in agreement with the above plan. The patient left the office in stable condition.  The patient will follow up in 12 weeks.    Medication Adjustments/Labs and Tests Ordered: Current medicines are reviewed at length with the patient today.  Concerns regarding medicines are outlined above.  Orders Placed This Encounter  Procedures   Basic metabolic panel   Magnesium   EKG 12-Lead    Meds ordered this encounter  Medications   valsartan (DIOVAN) 40 MG tablet    Sig: Take 1 tablet (40 mg total) by mouth daily.    Dispense:  90 tablet    Refill:  3     Patient Instructions  Medication Instructions:  Your physician has recommended you make the following change in your medication: STOP: Lisinopril START: Valsartan 40 mg once daily *If you need a refill on your cardiac medications before your next appointment,  please call your pharmacy*   Lab Work: Your physician recommends that you return for lab work:  TODAY:  BMET, [place]  If you have labs (blood work) drawn today and your tests are completely normal, you will receive your results only by: [place] (if you have MyChart) OR A paper copy in the mail If you have any lab test that is abnormal or we need to change your treatment, we will call you to review the results.   Testing/Procedures: None   Follow-Up: At [facility], you and your health needs are our priority.  As part of our continuing mission to provide you with exceptional heart care, we have created designated Provider Care Teams.  These Care Teams include your primary Cardiologist (physician) and Advanced Practice Providers (APPs -  Physician Assistants and Nurse Practitioners) who all work together to provide you with the care you need, when you need it.  We recommend signing up for the patient portal called "MyChart".  Sign up information is provided on this After Visit Summary.  MyChart is used to connect with patients for Virtual Visits (Telemedicine).  Patients are able to view lab/test results, encounter notes, upcoming appointments, etc.  Non-urgent messages can be sent to your provider as well.   To learn more about what you can do with MyChart, go to [email or web address].    Your next appointment:   12 week(s)  The format for your next appointment:   In Person     Other Instructions  Please take your blood pressure daily for 2 weeks, send the information via MyChart.    Adopting a Healthy Lifestyle.  Know what a healthy weight is for you (roughly BMI <25) and aim to maintain this   Aim for 7+ servings of fruits and vegetables daily   65-80+ fluid ounces of water or unsweet tea for  healthy kidneys   Limit to max 1 drink of alcohol per day; avoid smoking/tobacco   Limit animal fats in diet for cholesterol and heart health - choose grass fed whenever  available   Avoid highly processed foods, and foods high in saturated/trans fats   Aim for low stress - take time to unwind and care for your mental health   Aim for 150 min of moderate intensity exercise weekly for heart health, and weights twice weekly for bone health   Aim for 7-9 hours of sleep daily   When it comes to diets, agreement about the perfect plan isnt easy to find, even among the experts. Experts at the [employer] developed an idea known as the Healthy Eating Plate. Just imagine a plate divided into logical, healthy portions.   The emphasis is on diet quality:   Load up on vegetables and fruits - one-half of your plate: Aim for color and variety, and remember that potatoes dont count.   Go for whole grains - one-quarter of your plate: Whole wheat, barley, wheat berries, quinoa, oats, brown rice, and foods made with them. If you want pasta, go with whole wheat pasta.   Protein power - one-quarter of your plate: Fish, chicken, beans, and nuts are all healthy, versatile protein sources. Limit red meat.   The diet, however, does go beyond the plate, offering a few other suggestions.   Use healthy plant oils, such as olive, canola, soy, corn, sunflower and peanut. Check the labels, and avoid partially hydrogenated oil, which have unhealthy trans fats.   If youre thirsty, drink water. Coffee and tea are good in moderation, but skip sugary drinks and limit milk and dairy products to one or two daily servings.   The type of carbohydrate in the diet is more important than the amount. Some sources of carbohydrates, such as vegetables, fruits, whole grains, and beans-are healthier than others.   Finally, stay active  Signed, Berniece Salines, DO  07/20/2020 4:48 PM    [facility]

## 2020-07-20 NOTE — Patient Instructions (Signed)
Medication Instructions:  Your physician has recommended you make the following change in your medication: STOP: Lisinopril START: Valsartan 40 mg once daily *If you need a refill on your cardiac medications before your next appointment, please call your pharmacy*   Lab Work: Your physician recommends that you return for lab work:  TODAY:  BMET, [place]  If you have labs (blood work) drawn today and your tests are completely normal, you will receive your results only by: [place] (if you have MyChart) OR A paper copy in the mail If you have any lab test that is abnormal or we need to change your treatment, we will call you to review the results.   Testing/Procedures: None   Follow-Up: At [facility], you and your health needs are our priority.  As part of our continuing mission to provide you with exceptional heart care, we have created designated Provider Care Teams.  These Care Teams include your primary Cardiologist (physician) and Advanced Practice Providers (APPs -  Physician Assistants and Nurse Practitioners) who all work together to provide you with the care you need, when you need it.  We recommend signing up for the patient portal called "MyChart".  Sign up information is provided on this After Visit Summary.  MyChart is used to connect with patients for Virtual Visits (Telemedicine).  Patients are able to view lab/test results, encounter notes, upcoming appointments, etc.  Non-urgent messages can be sent to your provider as well.   To learn more about what you can do with MyChart, go to [email or web address].    Your next appointment:   12 week(s)  The format for your next appointment:   In Person     Other Instructions  Please take your blood pressure daily for 2 weeks, send the information via MyChart.

## 2020-07-21 ENCOUNTER — Encounter: Payer: 59 | Admitting: Physical Therapy

## 2020-07-21 ENCOUNTER — Telehealth: Payer: Self-pay | Admitting: Cardiology

## 2020-07-21 LAB — BASIC METABOLIC PANEL
BUN/Creatinine Ratio: 13 (ref 9–23)
BUN: 12 mg/dL (ref 6–24)
CO2: 21 mmol/L (ref 20–29)
Calcium: 9 mg/dL (ref 8.7–10.2)
Chloride: 102 mmol/L (ref 96–106)
Creatinine, Ser: 0.92 mg/dL (ref 0.57–1.00)
Glucose: 96 mg/dL (ref 65–99)
Potassium: 4.1 mmol/L (ref 3.5–5.2)
Sodium: 137 mmol/L (ref 134–144)
eGFR: 77 mL/min/{1.73_m2} (ref >59)

## 2020-07-21 LAB — MAGNESIUM: Magnesium: 2.1 mg/dL (ref 1.6–2.3)

## 2020-07-21 NOTE — Telephone Encounter (Signed)
Patient was advised she needed evaluation. She reports is her menstrual cycle, "It came really heavy this time  because the megace was discontinue due to taking blood thinners:". She reports she is feeling better and that she won't need the note.

## 2020-07-21 NOTE — Telephone Encounter (Signed)
Pt returning [place] phone call

## 2020-07-21 NOTE — Telephone Encounter (Signed)
Opened in error

## 2020-07-21 NOTE — Telephone Encounter (Signed)
Spoke with patient, see chart.    

## 2020-07-21 NOTE — Assessment & Plan Note (Addendum)
Will d/c coumadin, pt will start xarelto 15mg  bid for 21 days then transition to 20mg  once daily on day 22.  She will call before she runs out and we will send the continuation dose pack. Plan is for lifelong anticoagulation per hematology.

## 2020-07-21 NOTE — Telephone Encounter (Signed)
She has not been seen for this problem. Please schedule her an in person evaluation (ok to schedule with another provider since I am out sick).  Also,  if severe symptoms or fever she needs to go to the ER.

## 2020-07-24 ENCOUNTER — Other Ambulatory Visit: Payer: Self-pay | Admitting: Family

## 2020-07-28 ENCOUNTER — Encounter: Payer: 59 | Admitting: Physical Therapy

## 2020-07-28 MED ORDER — VALSARTAN 160 MG PO TABS: 160.0000 mg | ORAL_TABLET | Freq: Every day | ORAL | 3 refills | Status: DC

## 2020-08-04 ENCOUNTER — Encounter: Payer: 59 | Admitting: Physical Therapy

## 2020-08-05 ENCOUNTER — Telehealth: Payer: Self-pay | Admitting: *Deleted

## 2020-08-05 ENCOUNTER — Other Ambulatory Visit: Payer: Self-pay

## 2020-08-05 ENCOUNTER — Encounter: Payer: Self-pay | Admitting: Obstetrics & Gynecology

## 2020-08-05 ENCOUNTER — Telehealth (INDEPENDENT_AMBULATORY_CARE_PROVIDER_SITE_OTHER): Payer: 59 | Admitting: Obstetrics & Gynecology

## 2020-08-05 ENCOUNTER — Encounter: Payer: Self-pay | Admitting: *Deleted

## 2020-08-05 ENCOUNTER — Telehealth: Payer: Self-pay | Admitting: Family

## 2020-08-05 VITALS — BP 156/79 | Ht 62.0 in | Wt 290.0 lb

## 2020-08-05 DIAGNOSIS — N939 Abnormal uterine and vaginal bleeding, unspecified: Secondary | ICD-10-CM

## 2020-08-05 NOTE — Progress Notes (Signed)
Pt presents today to discuss heavy bleeding. LMP: [id number].

## 2020-08-05 NOTE — Progress Notes (Signed)
GYNECOLOGY VIRTUAL VISIT ENCOUNTER NOTE  Provider location: [facility]   Patient location: Home  I connected with Sharon Rivers on 08/05/20 at  8:15 AM EDT by MyChart Video Encounter and verified that I am speaking with the correct person using two identifiers.   I discussed the limitations, risks, security and privacy concerns of performing an evaluation and management service virtually and the availability of in person appointments. I also discussed with the patient that there may be a patient responsible charge related to this service. The patient expressed understanding and agreed to proceed.   History:  Sharon Rivers is a 50 y.o. [phone] female being evaluated today for AUB. Pt is s/p DVT after management with Progestins. Pt has a period June 13-22nd and it was very heavy and very painful.  She denies any abnormal vaginal discharge other concerns.  The Tylenol and Midol did not work. Pt is off blood  thinners. She is on Xarelto.    Past Medical History:  Diagnosis Date   Anxiety    Back pain    Chest pain    a. 07/2015 Myoview: EF 71%, medium size, mild intensity, partially reversible septal defect with overlying breast attenuation -> likely artifact. No significant reversible ischemia.   Constipation    Depression    Edema    feet and legs   Frequent headaches    GERD (gastroesophageal reflux disease)    History of DVT (deep vein thrombosis) 2018   Hypertension    Hypothyroidism    Insulin resistance    Joint pain    Migraines    OSA (obstructive sleep apnea) 03/17/2016   Osteoarthritis    Panic anxiety syndrome    Pulmonary embolism ([place]) 2022   SOB (shortness of breath)    a. 04/2016 Echo: EF 60-65%, Gr1 DD.   Swallowing difficulty    Past Surgical History:  Procedure Laterality Date   CESAREAN SECTION  2001 & 2002   ELBOW SURGERY     MINOR CARPAL TUNNEL     pinched nerve in elbow   WISDOM TOOTH EXTRACTION     The  following portions of the patient's history were reviewed and updated as appropriate: allergies, current medications, past family history, past medical history, past social history, past surgical history and problem list.   Review of Systems:  Pertinent items noted in HPI and remainder of comprehensive ROS otherwise negative.  Physical Exam:   General:  Alert, oriented and cooperative. Patient appears to be in no acute distress.  Mental Status: Normal mood and affect. Normal behavior. Normal judgment and thought content.   Respiratory: Normal respiratory effort, no problems with respiration noted  Rest of physical exam deferred due to type of encounter  Labs and Imaging 12/23/2019 CLINICAL DATA:  Abnormal uterine bleeding   EXAM: TRANSABDOMINAL ULTRASOUND OF PELVIS   TECHNIQUE: Transabdominal ultrasound examination of the pelvis was performed including evaluation of the uterus, ovaries, adnexal regions, and pelvic cul-de-sac.   COMPARISON:  None   FINDINGS: Uterus   Measurements: 8.9 x 4.1 x 5.3 cm = volume: 99.3 mL. Anteflexed uterus with contour the anterior uterus in the lower uterine segment compatible with prior C-section. Cervix grossly normal.   Endometrium   Thickness: 5 mm. No focal abnormality visualized. In the mid uterus endometrium is partially obscured due to shadowing from prior C-section scar.   Right ovary   Measurements: 2.0 x 1.6 x 1.6 cm = volume: 2.7 mL. Limited visualization  of the ovary. Color Doppler attempted, limited due to technical factors.   Left ovary   Measurements: Not visualized.   Other findings:  None   IMPRESSION: Normal endometrial thickness though with limited visualization in the mid uterus due to shadowing. If bleeding remains unresponsive to hormonal or medical therapy, sonohysterogram should be considered for focal lesion work-up. (Ref: Radiological Reasoning: Algorithmic Workup of Abnormal Vaginal Bleeding with  Endovaginal Sonography and Sonohysterography. AJR 2008; 341:P37-90)   Assessment and Plan:     Perimenopausal bleeding.  Pt with thin endometrial stripe. Given the blood thinners, when her menses do occur, they are heavier and longer. Pt unable to tolerate medical management. I have discussed other treatment.      Patient desires surgical management with hysteroscopy  and d&c with endometrial ablation. The risks of surgery were discussed in detail with the patient including but not limited to: bleeding which may require transfusion or reoperation; infection which may require prolonged hospitalization or re-hospitalization and antibiotic therapy; injury to bowel, bladder, ureters and major vessels or other surrounding organs; need for additional procedures including laparotomy; thromboembolic phenomenon, incisional problems and other postoperative or anesthesia complications.  Patient was told that the likelihood that her condition and symptoms will be treated effectively with this surgical management was very high; the postoperative expectations were also discussed in detail. The patient also understands the alternative treatment options which were discussed in full. All questions were answered.  She was told that she will be contacted by our surgical scheduler regarding the time and date of her surgery; routine preoperative instructions of having nothing to eat or drink after midnight on the day prior to surgery and also coming to the hospital 1 1/2 hours prior to her time of surgery were also emphasized.  She was told she may be called for a preoperative appointment about a week prior to surgery and will be given further preoperative instructions at that visit. Printed patient education handouts about the procedure were given to the patient to review at home.   I discussed the assessment and treatment plan with the patient. The patient was provided an opportunity to ask questions and all were answered. The  patient agreed with the plan and demonstrated an understanding of the instructions.   The patient was advised to call back or seek an in-person evaluation/go to the ED if the symptoms worsen or if the condition fails to improve as anticipated.  I sent a note to [name] and she will set up a appt for pt to have a pre-op.   I provided 20 minutes of face-to-face time during this encounter.  Lavonia Drafts, MD [facility] for [employer], [place]

## 2020-08-05 NOTE — Telephone Encounter (Signed)
Please contact pt to schedule a medical clearance evaluation for GYN surgery.

## 2020-08-05 NOTE — Telephone Encounter (Signed)
Call to patient. Advised surgeyr date of 09-08-20 at [facility], arrive 0530. Directions and instructions reviewed. Medications to be reviewed by pre-op nurse and advise which to take am of surgery. Will receive letter and hospital brochure in the mail.  Encounter closed.

## 2020-08-05 NOTE — Telephone Encounter (Signed)
Patient called back, I have scheduled her to see [name] for surgical clearance on 08/14/2020.

## 2020-08-05 NOTE — Telephone Encounter (Signed)
Called but no answer, lvm for her to call back

## 2020-08-05 NOTE — Telephone Encounter (Signed)
-----   Message from Sharon Drafts, MD sent at 08/05/2020  9:20 AM EDT ----- Pt needs hysteroscopy  and d&c with endometrial ablation for cont'd bleeding. Are you abel to medically clear her for this procedure.   Clh-S

## 2020-08-06 ENCOUNTER — Telehealth: Payer: Self-pay | Admitting: *Deleted

## 2020-08-06 NOTE — Telephone Encounter (Signed)
Called patient about recheduling appointment from 08/07/20 to 08/27/20 - patient confirmed

## 2020-08-07 ENCOUNTER — Ambulatory Visit: Payer: 59 | Admitting: Family

## 2020-08-07 ENCOUNTER — Ambulatory Visit (HOSPITAL_BASED_OUTPATIENT_CLINIC_OR_DEPARTMENT_OTHER): Admission: RE | Admit: 2020-08-07 | Payer: 59 | Source: Ambulatory Visit

## 2020-08-07 ENCOUNTER — Ambulatory Visit (HOSPITAL_BASED_OUTPATIENT_CLINIC_OR_DEPARTMENT_OTHER): Payer: 59

## 2020-08-07 ENCOUNTER — Inpatient Hospital Stay: Payer: 59

## 2020-08-11 ENCOUNTER — Ambulatory Visit: Payer: 59 | Admitting: Physical Therapy

## 2020-08-14 ENCOUNTER — Other Ambulatory Visit: Payer: Self-pay

## 2020-08-14 ENCOUNTER — Ambulatory Visit (HOSPITAL_BASED_OUTPATIENT_CLINIC_OR_DEPARTMENT_OTHER)
Admission: RE | Admit: 2020-08-14 | Discharge: 2020-08-14 | Disposition: A | Discharge status: Home / Self Care | Payer: 59 | Source: Ambulatory Visit | Attending: Family | Admitting: Family

## 2020-08-14 ENCOUNTER — Ambulatory Visit (INDEPENDENT_AMBULATORY_CARE_PROVIDER_SITE_OTHER): Payer: 59 | Admitting: Family

## 2020-08-14 VITALS — BP 148/80 | HR 85 | Temp 98.6°F | Resp 16 | Ht 62.0 in | Wt 295.0 lb

## 2020-08-14 DIAGNOSIS — Z01818 Encounter for other preprocedural examination: Secondary | ICD-10-CM | POA: Insufficient documentation

## 2020-08-14 DIAGNOSIS — I2699 Other pulmonary embolism without acute cor pulmonale: Secondary | ICD-10-CM

## 2020-08-14 DIAGNOSIS — J309 Allergic rhinitis, unspecified: Secondary | ICD-10-CM

## 2020-08-14 DIAGNOSIS — G4733 Obstructive sleep apnea (adult) (pediatric): Secondary | ICD-10-CM | POA: Diagnosis not present

## 2020-08-14 DIAGNOSIS — L304 Erythema intertrigo: Secondary | ICD-10-CM

## 2020-08-14 MED ORDER — NYSTATIN 100000 UNIT/GM EX POWD: 1.0000 "application " | Freq: Three times a day (TID) | CUTANEOUS | 1 refills | Status: AC

## 2020-08-14 MED ORDER — RIVAROXABAN 20 MG PO TABS
20.0000 mg | ORAL_TABLET | Freq: Every day | ORAL | 5 refills | Status: DC
Start: 2020-08-14 — End: 2021-03-15

## 2020-08-14 NOTE — Progress Notes (Signed)
Subjective:   By signing my name below, I, [name], attest that this documentation has been prepared under the direction and in the presence of [name] NP. 08/14/2020    Patient ID: Sharon Rivers, female    DOB: October 06, 1970, 50 y.o.   MRN: [id number]  Chief Complaint  Patient presents with   Medical Clearance    Here for clearance prior to surgery    HPI Patient is in today for surgical clearance visit. She has an upcoming hysteroscopy with d&c and endometrial ablation on 09/08/2020. She is currently taking xarelto at this time due to hx of PE. She is requesting a refill as well. She denies having any constipation, diarrhea, frequent headaches, swollen glands at this time.  Cough- She mentions having a mild cough 9 days ago due to allergies. She is taking OTC allergy medication to manage her cough. She has taken a Covid-19 test and was negative.  Congestion- She also has congestion. She uses Flonase and Astelin nasal spray to manage her congestions and finds relief. She denies having any facial pain. Rash- She has a heat rash under her breasts. She uses gold bond to manage her symptoms but finds no relief.  Health Maintenance Due  Topic Date Due   COLONOSCOPY (Pts 45-72yr Insurance coverage will need to be confirmed)  Never done   MAMMOGRAM  08/04/2020    Past Medical History:  Diagnosis Date   Anxiety    Back pain    Chest pain    a. 07/2015 Myoview: EF 71%, medium size, mild intensity, partially reversible septal defect with overlying breast attenuation -> likely artifact. No significant reversible ischemia.   Constipation    Depression    Edema    feet and legs   Frequent headaches    GERD (gastroesophageal reflux disease)    History of DVT (deep vein thrombosis) 2018   Hypertension    Hypothyroidism    Insulin resistance    Joint pain    Migraines    OSA (obstructive sleep apnea) 03/17/2016   Osteoarthritis    Panic anxiety syndrome    Pulmonary  embolism ([place] 2022   SOB (shortness of breath)    a. 04/2016 Echo: EF 60-65%, Gr1 DD.   Swallowing difficulty     Past Surgical History:  Procedure Laterality Date   CESAREAN SECTION  2001 & 2002   ELBOW SURGERY     MINOR CARPAL TUNNEL     pinched nerve in elbow   WISDOM TOOTH EXTRACTION      Family History  Problem Relation Age of Onset   Hypertension Mother        Living   Arthritis Mother    Thyroid disease Mother    Heart disease Mother        MI at age 49  Heart attack Mother    Migraines Mother    Depression Mother    Obesity Mother    Congestive Heart Failure Father        ?CHF   Hypertension Maternal Grandmother    Parkinson's disease Maternal Grandmother    Alzheimer's disease Maternal Grandmother    Cancer Maternal Grandfather    Hypertension Maternal Grandfather    Hypertension Paternal Grandmother    Hypertension Paternal Grandfather    Gout Brother    ADD / ADHD Son        x1   Other Son        #2-Unknown   Diabetes Neg Hx  Social History   Socioeconomic History   Marital status: Single    Spouse name: Not on file   Number of children: 2   Years of education: Not on file   Highest education level: Associate degree: occupational, Hotel manager, or vocational program  Occupational History   Occupation: Assembler    Comment: [employer]  Tobacco Use   Smoking status: Former    Pack years: 0.00    Types: Cigarettes    Quit date: 1992    Years since quitting: 30.5   Smokeless tobacco: Never   Tobacco comments:    quit 1992  Substance and Sexual Activity   Alcohol use: No    Alcohol/week: 0.0 standard drinks   Drug use: No   Sexual activity: Yes    Partners: Male    Birth control/protection: None  Other Topics Concern   Not on file  Social History Narrative   She has 2 children (grown). She did not retain custody of these children.   Lives with boyfriend in [place].   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 20 tablet 0   ALPRAZolam (XANAX) 0.25 MG tablet TAKE 1 TABLET BY MOUTH TWICE DAILY AS NEEDED FOR ANXIETY OR  FOR  SLEEP. DO NOT TAKE WITH HYDROCODONE. 30 tablet 0   amLODipine (NORVASC) 5 MG tablet Take 1 tablet (5 mg total) by mouth daily. (Patient taking differently: Take 5 mg by mouth every evening.) 90 tablet 1   azelastine (ASTELIN) 0.1 % nasal spray Place 1 spray into both nostrils 2 (two) times daily. Use in each nostril as directed 30 mL 5   EUTHYROX 100 MCG tablet Take 1 tablet by mouth once daily 90 tablet 0   fluticasone (FLONASE) 50 MCG/ACT nasal spray Place 2 sprays into both nostrils daily. (Patient taking differently: Place 2 sprays into both nostrils daily as needed for allergies.) 16 g 6   montelukast (SINGULAIR) 10 MG tablet Take 1 tablet (10 mg total) by mouth at bedtime. 90 tablet 1   ondansetron (ZOFRAN) 4 MG tablet Take 1 tablet (4 mg total) by mouth every 6 (six) hours as needed for nausea. 20 tablet 0   oxybutynin (DITROPAN-XL) 5 MG 24 hr tablet Take 1 tablet (5 mg total) by mouth at bedtime. 30 tablet 5   pantoprazole (PROTONIX) 40 MG tablet Take 1 tablet by mouth twice daily 60 tablet 0   rizatriptan (MAXALT) 10 MG tablet As needed for headache; may repeat in 2 hours if needed; max 2 per day or 8 per month 8 tablet 6   senna-docusate (SENOKOT-S) 8.6-50 MG tablet Take 1 tablet by mouth at bedtime as needed for mild constipation. 30 tablet 0   sertraline (ZOLOFT) 100 MG tablet Take 1 tablet (100 mg total)  by mouth daily. (Patient taking differently: Take 100 mg by mouth at bedtime.) 90 tablet 1   topiramate (TOPAMAX) 50 MG tablet Take 1 tablet (50 mg total) by mouth 2 (two)  times daily. 60 tablet 12   traZODone (DESYREL) 100 MG tablet Take 1 tablet (100 mg total) by mouth at bedtime. 90 tablet 1   valsartan (DIOVAN) 160 MG tablet Take 1 tablet (160 mg total) by mouth daily. 90 tablet 3   promethazine-dextromethorphan (PROMETHAZINE-DM) 6.25-15 MG/5ML syrup Take 5 mLs by mouth 4 (four) times daily as needed for cough. (Patient not taking: Reported on 08/05/2020) 240 mL 0   RIVAROXABAN (XARELTO) VTE STARTER PACK (15 & 20 MG) Follow package directions: Take one 68m tablet by mouth twice a day. On day 22, switch to one 233mtablet once a day. Take with food. 51 each 0   No facility-administered medications prior to visit.    Allergies  Allergen Reactions   Camphor    Folic Acid Hives   Lisinopril Cough   Megace [Megestrol] Other (See Comments)    Pt is s/p PE and DVT on low dose Megace   Menthol    Other Other (See Comments)   Adhesive [Tape] Other (See Comments)    Skin Burn.   Biofreeze [Menthol (Topical Analgesic)] Other (See Comments)    Skin Burn.   Sulfa Antibiotics Rash    Review of Systems  Constitutional:        (-)Swollen glands  HENT:  Positive for congestion. Negative for sinus pain.   Respiratory:  Positive for cough (mild).   Gastrointestinal:  Negative for constipation and diarrhea.  Skin:  Positive for rash (under breast bilateral).  Neurological:  Negative for headaches.      Objective:    Physical Exam Constitutional:      General: She is not in acute distress.    Appearance: Normal appearance. She is not ill-appearing.  HENT:     Head: Normocephalic and atraumatic.     Right Ear: Tympanic membrane, ear canal and external ear normal.     Left Ear: Tympanic membrane, ear canal and external ear normal.  Eyes:     Extraocular Movements: Extraocular movements  intact.     Pupils: Pupils are equal, round, and reactive to light.  Cardiovascular:     Rate and Rhythm: Normal rate and regular rhythm.     Pulses: Normal pulses.     Heart sounds: Normal heart sounds. No murmur heard.   No gallop.  Pulmonary:     Effort: Pulmonary effort is normal. No respiratory distress.     Breath sounds: Normal breath sounds. No wheezing, rhonchi or rales.  Abdominal:     General: Bowel sounds are normal. There is no distension.     Palpations: Abdomen is soft. There is no mass.     Tenderness: There is no abdominal tenderness. There is no guarding or rebound.     Hernia: No hernia is present.  Lymphadenopathy:     Cervical: No cervical adenopathy.  Skin:    General: Skin is warm and dry.     Comments: Erythematous rash noted beneath both breasts.  Neurological:     Mental Status: She is alert and oriented to person, place, and time.  Psychiatric:        Behavior: Behavior normal.    BP (!) 148/80 (BP Location: Right Arm, Patient Position: Sitting, Cuff Size: Large)   Pulse 85   Temp 98.6 F (37 C) (Oral)   Resp 16   Ht '5\' 2"'  (1.575 m)   Wt 295 lb (133.8 kg)   LMP 07/20/2020 (Exact Date)   SpO2 98%   BMI 53.96 kg/m  Wt  Readings from Last 3 Encounters:  08/14/20 295 lb (133.8 kg)  08/05/20 290 lb (131.5 kg)  07/20/20 295 lb (133.8 kg)       Assessment & Plan:   Problem List Items Addressed This Visit       Unprioritized   Pulmonary embolism ([place])    Reviewed her anticoagulation plans with Dr. Marin Olp- hematology. He recommended the following:  Stop Xarelto 2 days prior to her procedure.  No lovenox bridge needed. Restart xarelto the day after surgery.       Relevant Medications   rivaroxaban (XARELTO) 20 MG TABS tablet   Other Relevant Orders   EKG 12-Lead (Completed)   Comp Met (CMET) (Completed)   CBC with Differential/Platelet (Completed)   DG Chest 2 View (Completed)   Preoperative evaluation to rule out surgical  contraindication - Primary    Labs reviewed and stable. CXR clear. EKG tracing is personally reviewed.  EKG notes NSR.  No acute changes.  No medical contraindications to planned GYN surgery. See anticoagulation recommendations below:        Relevant Orders   EKG 12-Lead (Completed)   Comp Met (CMET) (Completed)   CBC with Differential/Platelet (Completed)   DG Chest 2 View (Completed)   OSA (obstructive sleep apnea)    Will need close monitoring post-operatively on CPAP.        Multiple pulmonary emboli (HCC)   Relevant Medications   rivaroxaban (XARELTO) 20 MG TABS tablet   Intertrigo    Trial of nystatin powder.        Allergic rhinitis    Continue OTC allergy medication. No sign of viral illness at this time.          Meds ordered this encounter  Medications   rivaroxaban (XARELTO) 20 MG TABS tablet    Sig: Take 1 tablet (20 mg total) by mouth daily with supper.    Dispense:  30 tablet    Refill:  5    Order Specific Question:   Supervising Provider    Answer:   Penni Homans A [[id number]]   nystatin (MYCOSTATIN/NYSTOP) powder    Sig: Apply 1 application topically 3 (three) times daily.    Dispense:  60 g    Refill:  1    Order Specific Question:   Supervising Provider    Answer:   Penni Homans A [[id number]]    I, [name] NP, personally preformed the services described in this documentation.  All medical record entries made by the scribe were at my direction and in my presence.  I have reviewed the chart and discharge instructions (if applicable) and agree that the record reflects my personal performance and is accurate and complete. 08/14/2020   I,[name],acting as a scribe for [name], NP.,have documented all relevant documentation on the behalf of [name], NP,as directed by  [name], NP while in the presence of [name], NP.   [name], NP

## 2020-08-14 NOTE — Patient Instructions (Signed)
Please complete Chest x-ray on the first floor.

## 2020-08-15 NOTE — Telephone Encounter (Signed)
-----   Message from Volanda Napoleon, MD sent at 08/14/2020  4:55 PM EDT ----- She only just has to have Xarelto stopped 2 days before her procedure.  She does not need any Lovenox bridge.  I would restart her Xarelto the day after her procedure.  Laurey Arrow  ----- Message ----- From: [name], NP Sent: 08/14/2020   4:03 PM EDT To: Volanda Napoleon, MD  [facility],  This pt is undergoing pre-op clearance for planned GYN procedure (hysteroscopy  and d&c with endometrial ablation) due to heavy bleeding.  Would it be ok on your end for her to hold the xarelto pre-op?  I assume she will need a lovenox bridge, correct?    Thanks,  [employer]

## 2020-08-16 LAB — COMPREHENSIVE METABOLIC PANEL
AG Ratio: 1.3 (calc) (ref 1.0–2.5)
ALT: 14 U/L (ref 6–29)
AST: 12 U/L (ref 10–35)
Albumin: 3.7 g/dL (ref 3.6–5.1)
Alkaline phosphatase (APISO): 69 U/L (ref 31–125)
BUN: 11 mg/dL (ref 7–25)
CO2: 25 mmol/L (ref 20–32)
Calcium: 8.4 mg/dL — ABNORMAL LOW (ref 8.6–10.2)
Chloride: 107 mmol/L (ref 98–110)
Creat: 0.86 mg/dL (ref 0.50–1.10)
Globulin: 2.8 g/dL (calc) (ref 1.9–3.7)
Glucose, Bld: 87 mg/dL (ref 65–99)
Potassium: 3.6 mmol/L (ref 3.5–5.3)
Sodium: 138 mmol/L (ref 135–146)
Total Bilirubin: 0.5 mg/dL (ref 0.2–1.2)
Total Protein: 6.5 g/dL (ref 6.1–8.1)

## 2020-08-16 LAB — CBC WITH DIFFERENTIAL/PLATELET

## 2020-08-17 ENCOUNTER — Telehealth: Payer: Self-pay | Admitting: *Deleted

## 2020-08-17 DIAGNOSIS — I2699 Other pulmonary embolism without acute cor pulmonale: Secondary | ICD-10-CM

## 2020-08-17 DIAGNOSIS — Z01818 Encounter for other preprocedural examination: Secondary | ICD-10-CM

## 2020-08-17 NOTE — Telephone Encounter (Signed)
Pt was in the lab on 08/14/20.  Received message from [facility] that they did not receive a lavender top tube fore the CBC.  We do not know if tube was lost in transit but it will need to be recollected since the lab states they do not have it. Tube is not here either. Notified pt and she states she will return tomorrow morning around 7am. Future order placed.

## 2020-08-18 ENCOUNTER — Other Ambulatory Visit: Payer: Self-pay

## 2020-08-18 ENCOUNTER — Other Ambulatory Visit (INDEPENDENT_AMBULATORY_CARE_PROVIDER_SITE_OTHER): Payer: 59

## 2020-08-18 DIAGNOSIS — I2699 Other pulmonary embolism without acute cor pulmonale: Secondary | ICD-10-CM

## 2020-08-18 DIAGNOSIS — Z01818 Encounter for other preprocedural examination: Secondary | ICD-10-CM | POA: Diagnosis not present

## 2020-08-18 LAB — CBC WITH DIFFERENTIAL/PLATELET
Basophils Absolute: 0.1 10*3/uL (ref 0.0–0.1)
Basophils Relative: 0.8 % (ref 0.0–3.0)
Eosinophils Absolute: 0.3 10*3/uL (ref 0.0–0.7)
Eosinophils Relative: 4.5 % (ref 0.0–5.0)
HCT: 35.7 % — ABNORMAL LOW (ref 36.0–46.0)
Hemoglobin: 12.3 g/dL (ref 12.0–15.0)
Lymphocytes Relative: 16.3 % (ref 12.0–46.0)
Lymphs Abs: 1.2 10*3/uL (ref 0.7–4.0)
MCHC: 34.4 g/dL (ref 30.0–36.0)
MCV: 82.8 fl (ref 78.0–100.0)
Monocytes Absolute: 0.5 10*3/uL (ref 0.1–1.0)
Monocytes Relative: 6.9 % (ref 3.0–12.0)
Neutro Abs: 5.2 10*3/uL (ref 1.4–7.7)
Neutrophils Relative %: 71.5 % (ref 43.0–77.0)
Platelets: 279 10*3/uL (ref 150.0–400.0)
RBC: 4.31 Mil/uL (ref 3.87–5.11)
RDW: 14.3 % (ref 11.5–15.5)
WBC: 7.2 10*3/uL (ref 4.0–10.5)

## 2020-08-18 NOTE — Progress Notes (Signed)
Pt here for redraw. No charge.

## 2020-08-19 ENCOUNTER — Telehealth: Payer: Self-pay | Admitting: Family

## 2020-08-19 DIAGNOSIS — J309 Allergic rhinitis, unspecified: Secondary | ICD-10-CM | POA: Insufficient documentation

## 2020-08-19 DIAGNOSIS — L304 Erythema intertrigo: Secondary | ICD-10-CM | POA: Insufficient documentation

## 2020-08-19 NOTE — Assessment & Plan Note (Signed)
Will need close monitoring post-operatively on CPAP.

## 2020-08-19 NOTE — Assessment & Plan Note (Addendum)
Labs reviewed and stable. CXR clear. EKG tracing is personally reviewed.  EKG notes NSR.  No acute changes.  No medical contraindications to planned GYN surgery. See anticoagulation recommendations below:

## 2020-08-19 NOTE — Assessment & Plan Note (Deleted)
Reviewed her anticoagulation plans with Dr. Marin Olp- hematology. He recommended the following:  Stop Xarelto 2 days prior to her procedure.  No lovenox bridge needed. Restart xarelto the day after surgery.

## 2020-08-19 NOTE — Assessment & Plan Note (Signed)
Continue OTC allergy medication. No sign of viral illness at this time.

## 2020-08-19 NOTE — Assessment & Plan Note (Signed)
Trial of nystatin powder  

## 2020-08-19 NOTE — Telephone Encounter (Signed)
Pt called. Pt verbalized understanding

## 2020-08-19 NOTE — Telephone Encounter (Signed)
Please advise pt that I am sending her clearance to GYN.  Dr. Marin Olp would like for her to stop Xarelto 2 days before surgery (last dose on 09/05/20) and restart the day after surgery.

## 2020-08-19 NOTE — Assessment & Plan Note (Signed)
Reviewed her anticoagulation plans with Dr. Marin Olp- hematology. He recommended the following:  Stop Xarelto 2 days prior to her procedure.  No lovenox bridge needed. Restart xarelto the day after surgery.

## 2020-08-20 ENCOUNTER — Other Ambulatory Visit: Payer: Self-pay | Admitting: Family

## 2020-08-21 NOTE — Telephone Encounter (Signed)
Requesting: alprazolam 0.25mg  Contract:07/22/2019 UDS: 04/21/2020 Last Visit: 08/14/2020 Next Visit: 10/30/2020 Last Refill: 07/27/2020 #30 and 0RF  Please Advise

## 2020-08-26 ENCOUNTER — Ambulatory Visit: Payer: 59 | Admitting: Obstetrics and Gynecology

## 2020-08-27 ENCOUNTER — Inpatient Hospital Stay (HOSPITAL_BASED_OUTPATIENT_CLINIC_OR_DEPARTMENT_OTHER): Payer: 59 | Admitting: Family

## 2020-08-27 ENCOUNTER — Inpatient Hospital Stay: Payer: 59 | Attending: Family

## 2020-08-27 ENCOUNTER — Encounter: Payer: Self-pay | Admitting: Family

## 2020-08-27 ENCOUNTER — Telehealth: Payer: Self-pay | Admitting: *Deleted

## 2020-08-27 ENCOUNTER — Other Ambulatory Visit: Payer: Self-pay

## 2020-08-27 VITALS — BP 135/98 | HR 75 | Temp 98.3°F | Resp 19 | Ht 62.0 in | Wt 297.0 lb

## 2020-08-27 DIAGNOSIS — I824Y9 Acute embolism and thrombosis of unspecified deep veins of unspecified proximal lower extremity: Secondary | ICD-10-CM | POA: Diagnosis not present

## 2020-08-27 DIAGNOSIS — E7211 Homocystinuria: Secondary | ICD-10-CM | POA: Diagnosis not present

## 2020-08-27 DIAGNOSIS — G4730 Sleep apnea, unspecified: Secondary | ICD-10-CM | POA: Insufficient documentation

## 2020-08-27 DIAGNOSIS — I2699 Other pulmonary embolism without acute cor pulmonale: Secondary | ICD-10-CM | POA: Insufficient documentation

## 2020-08-27 DIAGNOSIS — I82401 Acute embolism and thrombosis of unspecified deep veins of right lower extremity: Secondary | ICD-10-CM | POA: Insufficient documentation

## 2020-08-27 DIAGNOSIS — Z7901 Long term (current) use of anticoagulants: Secondary | ICD-10-CM | POA: Diagnosis not present

## 2020-08-27 DIAGNOSIS — R760 Raised antibody titer: Secondary | ICD-10-CM

## 2020-08-27 LAB — CMP (CANCER CENTER ONLY)
ALT: 15 U/L (ref 0–44)
AST: 13 U/L — ABNORMAL LOW (ref 15–41)
Albumin: 4.1 g/dL (ref 3.5–5.0)
Alkaline Phosphatase: 64 U/L (ref 38–126)
Anion gap: 6 (ref 5–15)
BUN: 14 mg/dL (ref 6–20)
CO2: 28 mmol/L (ref 22–32)
Calcium: 9.5 mg/dL (ref 8.9–10.3)
Chloride: 105 mmol/L (ref 98–111)
Creatinine: 0.97 mg/dL (ref 0.44–1.00)
GFR, Estimated: >60 mL/min (ref >60)
Glucose, Bld: 89 mg/dL (ref 70–99)
Potassium: 3.7 mmol/L (ref 3.5–5.1)
Sodium: 139 mmol/L (ref 135–145)
Total Bilirubin: 0.5 mg/dL (ref 0.3–1.2)
Total Protein: 7.3 g/dL (ref 6.5–8.1)

## 2020-08-27 LAB — CBC WITH DIFFERENTIAL (CANCER CENTER ONLY)
Abs Immature Granulocytes: 0.07 10*3/uL (ref 0.00–0.07)
Basophils Absolute: 0 10*3/uL (ref 0.0–0.1)
Basophils Relative: 0 %
Eosinophils Absolute: 0.3 10*3/uL (ref 0.0–0.5)
Eosinophils Relative: 5 %
HCT: 37.8 % (ref 36.0–46.0)
Hemoglobin: 12.6 g/dL (ref 12.0–15.0)
Immature Granulocytes: 1 %
Lymphocytes Relative: 19 %
Lymphs Abs: 1.3 10*3/uL (ref 0.7–4.0)
MCH: 28.8 pg (ref 26.0–34.0)
MCHC: 33.3 g/dL (ref 30.0–36.0)
MCV: 86.5 fL (ref 80.0–100.0)
Monocytes Absolute: 0.5 10*3/uL (ref 0.1–1.0)
Monocytes Relative: 7 %
Neutro Abs: 4.8 10*3/uL (ref 1.7–7.7)
Neutrophils Relative %: 68 %
Platelet Count: 274 10*3/uL (ref 150–400)
RBC: 4.37 MIL/uL (ref 3.87–5.11)
RDW: 13.9 % (ref 11.5–15.5)
WBC Count: 7 10*3/uL (ref 4.0–10.5)
nRBC: 0 % (ref 0.0–0.2)

## 2020-08-27 NOTE — Telephone Encounter (Signed)
Per 08/27/20 los - called and gave upcoming appointments - patient confirmed - view mychart

## 2020-08-27 NOTE — Progress Notes (Signed)
Hematology and Oncology Follow Up Visit  Sharon Rivers [id number] Feb 28, 1970 50 y.o. 08/27/2020   Principle Diagnosis:  DVT right lower extremity and bilateral pulmonary emboli with right heart strain   Current Therapy:   Xarelto 20 mg PO daily   Interim History:  Sharon Rivers is here today for follow-up. She is doing fairly well and likes being on Xarelto much better than coumadin.  She is still struggling with sleep due to severe sleep apnea. She is waiting to get her CPAP but has been fitted for a mask.  She is fatigued due to the lack of sleep.  Her cycle has been heavy on anticoagulation and she had to stop her Megace due to thrombotic event.  She will be having a uterine ablation on 09/07/20 with Dr. Ihor Dow.  She had to reschedule her repeat Ct angio and will be having tomorrow.  No fever, chills, n/v, cough, rash, dizziness, SOB, chest pain, palpitations, abdominal pain or changes in bowel or bladder habits.  No swelling, tenderness, numbness or tingling in her extremities.  No falls or syncope to report.  She is eating well and staying hydrated throughout the day. Her weight is stable at 297 lbs.   ECOG Performance Status: 1 - Symptomatic but completely ambulatory  Medications:  Allergies as of 08/27/2020       Reactions   Camphor    Folic Acid Hives   Lisinopril Cough   Megace [megestrol] Other (See Comments)   Pt is s/p PE and DVT on low dose Megace   Menthol    Other Other (See Comments)   Adhesive [tape] Other (See Comments)   Skin Burn.   Biofreeze [menthol (topical Analgesic)] Other (See Comments)   Skin Burn.   Sulfa Antibiotics Rash        Medication List        Accurate as of August 27, 2020  1:29 PM. If you have any questions, ask your nurse or doctor.          acetaminophen 325 MG tablet Commonly known as: TYLENOL Take 2 tablets (650 mg total) by mouth every 6 (six) hours as needed for mild pain (or Fever >/= 101).   ALPRAZolam 0.25 MG  tablet Commonly known as: XANAX TAKE 1 TABLET BY MOUTH TWICE DAILY AS NEEDED FOR ANXIETY AND FOR SLEEP. DO NOT TAKE WITH HYDROCODONE.   amLODipine 5 MG tablet Commonly known as: NORVASC Take 1 tablet (5 mg total) by mouth daily. What changed: when to take this   azelastine 0.1 % nasal spray Commonly known as: ASTELIN Place 1 spray into both nostrils 2 (two) times daily. Use in each nostril as directed   Euthyrox 100 MCG tablet Generic drug: levothyroxine Take 1 tablet by mouth once daily   fluticasone 50 MCG/ACT nasal spray Commonly known as: FLONASE Place 2 sprays into both nostrils daily. What changed:  when to take this reasons to take this   montelukast 10 MG tablet Commonly known as: SINGULAIR Take 1 tablet (10 mg total) by mouth at bedtime.   nystatin powder Commonly known as: MYCOSTATIN/NYSTOP Apply 1 application topically 3 (three) times daily.   ondansetron 4 MG tablet Commonly known as: ZOFRAN Take 1 tablet (4 mg total) by mouth every 6 (six) hours as needed for nausea.   oxybutynin 5 MG 24 hr tablet Commonly known as: DITROPAN-XL Take 1 tablet (5 mg total) by mouth at bedtime.   pantoprazole 40 MG tablet Commonly known as: PROTONIX Take 1 tablet by  mouth twice daily   rivaroxaban 20 MG Tabs tablet Commonly known as: XARELTO Take 1 tablet (20 mg total) by mouth daily with supper.   rizatriptan 10 MG tablet Commonly known as: MAXALT As needed for headache; may repeat in 2 hours if needed; max 2 per day or 8 per month   senna-docusate 8.6-50 MG tablet Commonly known as: Senokot-S Take 1 tablet by mouth at bedtime as needed for mild constipation.   sertraline 100 MG tablet Commonly known as: ZOLOFT Take 1 tablet (100 mg total) by mouth daily. What changed: when to take this   topiramate 50 MG tablet Commonly known as: TOPAMAX Take 1 tablet (50 mg total) by mouth 2 (two) times daily.   traZODone 100 MG tablet Commonly known as: DESYREL Take 1  tablet (100 mg total) by mouth at bedtime.   valsartan 160 MG tablet Commonly known as: Diovan Take 1 tablet (160 mg total) by mouth daily.        Allergies:  Allergies  Allergen Reactions   Camphor    Folic Acid Hives   Lisinopril Cough   Megace [Megestrol] Other (See Comments)    Pt is s/p PE and DVT on low dose Megace   Menthol    Other Other (See Comments)   Adhesive [Tape] Other (See Comments)    Skin Burn.   Biofreeze [Menthol (Topical Analgesic)] Other (See Comments)    Skin Burn.   Sulfa Antibiotics Rash    Past Medical History, Surgical history, Social history, and Family History were reviewed and updated.  Review of Systems: All other 10 point review of systems is negative.   Physical Exam:  vitals were not taken for this visit.   Wt Readings from Last 3 Encounters:  08/14/20 295 lb (133.8 kg)  08/05/20 290 lb (131.5 kg)  07/20/20 295 lb (133.8 kg)    Ocular: Sclerae unicteric, pupils equal, round and reactive to light Ear-nose-throat: Oropharynx clear, dentition fair Lymphatic: No cervical or supraclavicular adenopathy Lungs no rales or rhonchi, good excursion bilaterally Heart regular rate and rhythm, no murmur appreciated Abd soft, nontender, positive bowel sounds MSK no focal spinal tenderness, no joint edema Neuro: non-focal, well-oriented, appropriate affect Breasts: Deferred   Lab Results  Component Value Date   WBC 7.0 08/27/2020   HGB 12.6 08/27/2020   HCT 37.8 08/27/2020   MCV 86.5 08/27/2020   PLT 274 08/27/2020   No results found for: FERRITIN, IRON, TIBC, UIBC, IRONPCTSAT Lab Results  Component Value Date   RBC 4.37 08/27/2020   No results found for: KPAFRELGTCHN, LAMBDASER, KAPLAMBRATIO No results found for: IGGSERUM, IGA, IGMSERUM No results found for: Odetta Pink, SPEI   Chemistry      Component Value Date/Time   NA 138 08/14/2020 1617   NA 137 07/20/2020 1633   K  3.6 08/14/2020 1617   CL 107 08/14/2020 1617   CO2 25 08/14/2020 1617   BUN 11 08/14/2020 1617   BUN 12 07/20/2020 1633   CREATININE 0.86 08/14/2020 1617      Component Value Date/Time   CALCIUM 8.4 (L) 08/14/2020 1617   ALKPHOS 65 05/29/2020 1505   AST 12 08/14/2020 1617   AST 11 (L) 05/29/2020 1505   ALT 14 08/14/2020 1617   ALT 15 05/29/2020 1505   BILITOT 0.5 08/14/2020 1617   BILITOT 0.3 05/29/2020 1505       Impression and Plan: Sharon Rivers is a very pleasant 50 yo caucasian female  with recurrent right lower extremity DVT and now bilateral PE's. She had an elevated  homocystine level and positive lupus anticoagulant.  Coag labs pending.  She is doing well on Xarelto and will continue her same regimen.  We will contact her with results of her CT angio once available.  Follow-up in 4 months.  She can contact our office with any questions of concerns.   Sharon Peace, NP 7/21/20221:29 PM

## 2020-08-28 ENCOUNTER — Ambulatory Visit (HOSPITAL_BASED_OUTPATIENT_CLINIC_OR_DEPARTMENT_OTHER)
Admission: RE | Admit: 2020-08-28 | Discharge: 2020-08-28 | Disposition: A | Discharge status: Home / Self Care | Payer: 59 | Source: Ambulatory Visit | Attending: Family | Admitting: Family

## 2020-08-28 DIAGNOSIS — I2699 Other pulmonary embolism without acute cor pulmonale: Secondary | ICD-10-CM | POA: Insufficient documentation

## 2020-08-28 DIAGNOSIS — I824Y9 Acute embolism and thrombosis of unspecified deep veins of unspecified proximal lower extremity: Secondary | ICD-10-CM | POA: Insufficient documentation

## 2020-08-28 LAB — HOMOCYSTEINE: Homocysteine: 17.8 umol/L — ABNORMAL HIGH (ref 0.0–14.5)

## 2020-08-28 MED ORDER — IOHEXOL 350 MG/ML SOLN
100.0000 mL | Freq: Once | INTRAVENOUS | Status: AC | PRN
  Administered 2020-08-28: 69 mL via INTRAVENOUS

## 2020-08-29 LAB — LUPUS ANTICOAGULANT PANEL
DRVVT: 65.1 s — ABNORMAL HIGH (ref 0.0–47.0)
PTT Lupus Anticoagulant: 31.5 s (ref 0.0–51.9)

## 2020-08-29 LAB — DRVVT MIX: dRVVT Mix: 57.3 s — ABNORMAL HIGH (ref 0.0–40.4)

## 2020-08-29 LAB — DRVVT CONFIRM: dRVVT Confirm: 1.3 ratio — ABNORMAL HIGH (ref 0.8–1.2)

## 2020-08-31 ENCOUNTER — Telehealth: Payer: Self-pay | Admitting: *Deleted

## 2020-08-31 NOTE — Telephone Encounter (Signed)
As noted below by Laverna Peace, NP, I informed the patient that the US showed non occlusive chromic thrombus. You will stay on Xarelto long term. Your CT Angio showed no PE. She verbalized understanding.

## 2020-08-31 NOTE — Telephone Encounter (Signed)
-----   Message from Eliezer Bottom, NP sent at 08/31/2020  1:13 PM EDT ----- US showed non occlusive chronic thrombus. She will stay on Xarelto long term. Her CT angio showed no PE!!!! WOOO HOOO!!!!!!! Keep up the good work :)  Journalist, newspaper  ----- [employer] ----- From: Buel Ream, Rad Results In Sent: 08/28/2020   4:57 PM EDT To: Eliezer Bottom, NP

## 2020-09-07 ENCOUNTER — Encounter (HOSPITAL_COMMUNITY): Payer: Self-pay | Admitting: Obstetrics & Gynecology

## 2020-09-07 ENCOUNTER — Other Ambulatory Visit: Payer: Self-pay

## 2020-09-07 NOTE — Anesthesia Preprocedure Evaluation (Addendum)
Anesthesia Evaluation  Patient identified by MRN, date of birth, ID band Patient awake    Reviewed: Allergy & Precautions, NPO status , Patient's Chart, lab work & pertinent test results  History of Anesthesia Complications (+) PONV  Airway Mallampati: II  TM Distance: >3 FB Neck ROM: Full    Dental  (+) Poor Dentition, Missing, Loose, Chipped, Dental Advisory Given   Pulmonary sleep apnea (begins CPAP tomorrow) , COPD,  COPD inhaler, former smoker, PE   breath sounds clear to auscultation       Cardiovascular hypertension, Pt. on medications (-) angina+ DVT   Rhythm:Regular Rate:Normal  05/2020 ECHO: EF 60-65%, normal LVF, mild LVH, grade 1 DD, no significant valvular abnormalities   Neuro/Psych  Headaches, Anxiety Depression    GI/Hepatic Neg liver ROS, GERD  Medicated and Poorly Controlled,  Endo/Other  Hypothyroidism   Renal/GU negative Renal ROS     Musculoskeletal  (+) Arthritis ,   Abdominal (+) + obese,   Peds  Hematology Xarelto: last dose Sat evening   Anesthesia Other Findings   Reproductive/Obstetrics                            Anesthesia Physical Anesthesia Plan  ASA: 3  Anesthesia Plan: General   Post-op Pain Management:    Induction: Intravenous and Rapid sequence  PONV Risk Score and Plan: 4 or greater and Ondansetron, Dexamethasone and Scopolamine patch - Pre-op  Airway Management Planned: Oral ETT and Video Laryngoscope Planned  Additional Equipment: None  Intra-op Plan:   Post-operative Plan: Extubation in OR  Informed Consent: I have reviewed the patients History and Physical, chart, labs and discussed the procedure including the risks, benefits and alternatives for the proposed anesthesia with the patient or authorized representative who has indicated his/her understanding and acceptance.     Dental advisory given  Plan Discussed with: CRNA and  Surgeon  Anesthesia Plan Comments:        Anesthesia Quick Evaluation

## 2020-09-07 NOTE — Progress Notes (Signed)
Spoke with pt for pre-op call. Pt is treated for HTN, PE and hypothyroidism. Pt is on Xarelto and was instructed by her PCP to hold it 2 days prior to surgery. Last dose was 09/05/20 PM dose. Medical clearance in Epic on 08/14/20 from [name], NP.  Pt's surgery is scheduled as ambulatory so no Covid test is required prior to surgery. Pt denies any Covid symptoms

## 2020-09-08 ENCOUNTER — Encounter (HOSPITAL_COMMUNITY): Payer: Self-pay | Admitting: Obstetrics & Gynecology

## 2020-09-08 ENCOUNTER — Ambulatory Visit (HOSPITAL_COMMUNITY): Payer: 59 | Admitting: Certified Registered Nurse Anesthetist

## 2020-09-08 ENCOUNTER — Encounter (HOSPITAL_COMMUNITY)
Admission: RE | Disposition: A | Discharge status: Home / Self Care | Payer: Self-pay | Source: Home / Self Care | Attending: Obstetrics & Gynecology

## 2020-09-08 ENCOUNTER — Ambulatory Visit (HOSPITAL_COMMUNITY)
Admission: RE | Admit: 2020-09-08 | Discharge: 2020-09-08 | Disposition: A | Discharge status: Home / Self Care | Payer: 59 | Attending: Obstetrics & Gynecology | Admitting: Obstetrics & Gynecology

## 2020-09-08 DIAGNOSIS — Z86711 Personal history of pulmonary embolism: Secondary | ICD-10-CM | POA: Insufficient documentation

## 2020-09-08 DIAGNOSIS — Z7901 Long term (current) use of anticoagulants: Secondary | ICD-10-CM | POA: Insufficient documentation

## 2020-09-08 DIAGNOSIS — G4733 Obstructive sleep apnea (adult) (pediatric): Secondary | ICD-10-CM | POA: Diagnosis not present

## 2020-09-08 DIAGNOSIS — Z86718 Personal history of other venous thrombosis and embolism: Secondary | ICD-10-CM | POA: Diagnosis not present

## 2020-09-08 DIAGNOSIS — G4700 Insomnia, unspecified: Secondary | ICD-10-CM | POA: Insufficient documentation

## 2020-09-08 DIAGNOSIS — I10 Essential (primary) hypertension: Secondary | ICD-10-CM | POA: Insufficient documentation

## 2020-09-08 DIAGNOSIS — Z8349 Family history of other endocrine, nutritional and metabolic diseases: Secondary | ICD-10-CM | POA: Insufficient documentation

## 2020-09-08 DIAGNOSIS — E038 Other specified hypothyroidism: Secondary | ICD-10-CM | POA: Diagnosis not present

## 2020-09-08 DIAGNOSIS — Z882 Allergy status to sulfonamides status: Secondary | ICD-10-CM | POA: Insufficient documentation

## 2020-09-08 DIAGNOSIS — Z6841 Body Mass Index (BMI) 40.0 and over, adult: Secondary | ICD-10-CM | POA: Diagnosis not present

## 2020-09-08 DIAGNOSIS — Z888 Allergy status to other drugs, medicaments and biological substances status: Secondary | ICD-10-CM | POA: Insufficient documentation

## 2020-09-08 DIAGNOSIS — Z8249 Family history of ischemic heart disease and other diseases of the circulatory system: Secondary | ICD-10-CM | POA: Diagnosis not present

## 2020-09-08 DIAGNOSIS — Z87891 Personal history of nicotine dependence: Secondary | ICD-10-CM | POA: Insufficient documentation

## 2020-09-08 DIAGNOSIS — N939 Abnormal uterine and vaginal bleeding, unspecified: Secondary | ICD-10-CM

## 2020-09-08 DIAGNOSIS — Z79899 Other long term (current) drug therapy: Secondary | ICD-10-CM | POA: Insufficient documentation

## 2020-09-08 HISTORY — DX: Nausea with vomiting, unspecified: R11.2

## 2020-09-08 HISTORY — PX: HYSTEROSCOPY WITH NOVASURE: SHX5574

## 2020-09-08 HISTORY — PX: DILATION AND CURETTAGE OF UTERUS: SHX78

## 2020-09-08 HISTORY — DX: Other specified postprocedural states: Z98.890

## 2020-09-08 LAB — CBC
HCT: 36 % (ref 36.0–46.0)
Hemoglobin: 12 g/dL (ref 12.0–15.0)
MCH: 29.1 pg (ref 26.0–34.0)
MCHC: 33.3 g/dL (ref 30.0–36.0)
MCV: 87.2 fL (ref 80.0–100.0)
Platelets: 249 10*3/uL (ref 150–400)
RBC: 4.13 MIL/uL (ref 3.87–5.11)
RDW: 14.1 % (ref 11.5–15.5)
WBC: 5.8 10*3/uL (ref 4.0–10.5)
nRBC: 0 % (ref 0.0–0.2)

## 2020-09-08 LAB — POCT PREGNANCY, URINE: Preg Test, Ur: NEGATIVE

## 2020-09-08 SURGERY — HYSTEROSCOPY WITH NOVASURE
Anesthesia: General | Site: Uterus

## 2020-09-08 MED ORDER — PROPOFOL 10 MG/ML IV BOLUS
INTRAVENOUS | Status: DC | PRN
Start: 2020-09-08 — End: 2020-09-08
  Administered 2020-09-08: 200 mg via INTRAVENOUS

## 2020-09-08 MED ORDER — DEXAMETHASONE SODIUM PHOSPHATE 10 MG/ML IJ SOLN
INTRAMUSCULAR | Status: DC | PRN
  Administered 2020-09-08: 10 mg via INTRAVENOUS

## 2020-09-08 MED ORDER — BUPIVACAINE HCL (PF) 0.5 % IJ SOLN
INTRAMUSCULAR | Status: AC
  Filled 2020-09-08: qty 30

## 2020-09-08 MED ORDER — OXYCODONE HCL 5 MG PO TABS: 5.0000 mg | ORAL_TABLET | Freq: Once | ORAL | Status: DC | PRN

## 2020-09-08 MED ORDER — PROMETHAZINE HCL 25 MG/ML IJ SOLN: 6.2500 mg | INTRAMUSCULAR | Status: DC | PRN

## 2020-09-08 MED ORDER — MIDAZOLAM HCL 2 MG/2ML IJ SOLN
INTRAMUSCULAR | Status: AC
  Filled 2020-09-08: qty 2

## 2020-09-08 MED ORDER — SODIUM CHLORIDE 0.9 % IR SOLN
Status: DC | PRN
  Administered 2020-09-08: 3000 mL

## 2020-09-08 MED ORDER — OXYCODONE HCL 5 MG/5ML PO SOLN: 5.0000 mg | Freq: Once | ORAL | Status: DC | PRN

## 2020-09-08 MED ORDER — ROCURONIUM BROMIDE 10 MG/ML (PF) SYRINGE
PREFILLED_SYRINGE | INTRAVENOUS | Status: AC
  Filled 2020-09-08: qty 10

## 2020-09-08 MED ORDER — LACTATED RINGERS IV SOLN
INTRAVENOUS | Status: DC
Start: 2020-09-08 — End: 2020-09-08

## 2020-09-08 MED ORDER — BUPIVACAINE HCL (PF) 0.5 % IJ SOLN
INTRAMUSCULAR | Status: DC | PRN
  Administered 2020-09-08: 20 mL

## 2020-09-08 MED ORDER — FENTANYL CITRATE (PF) 100 MCG/2ML IJ SOLN: 25.0000 ug | INTRAMUSCULAR | Status: DC | PRN

## 2020-09-08 MED ORDER — SUCCINYLCHOLINE CHLORIDE 200 MG/10ML IV SOSY
PREFILLED_SYRINGE | INTRAVENOUS | Status: AC
  Filled 2020-09-08: qty 10

## 2020-09-08 MED ORDER — ONDANSETRON HCL 4 MG/2ML IJ SOLN
INTRAMUSCULAR | Status: DC | PRN
  Administered 2020-09-08: 4 mg via INTRAVENOUS

## 2020-09-08 MED ORDER — ORAL CARE MOUTH RINSE: 15.0000 mL | Freq: Once | OROMUCOSAL | Status: AC

## 2020-09-08 MED ORDER — LIDOCAINE 2% (20 MG/ML) 5 ML SYRINGE
INTRAMUSCULAR | Status: AC
  Filled 2020-09-08: qty 5

## 2020-09-08 MED ORDER — ONDANSETRON HCL 4 MG/2ML IJ SOLN
INTRAMUSCULAR | Status: AC
  Filled 2020-09-08: qty 2

## 2020-09-08 MED ORDER — SCOPOLAMINE 1 MG/3DAYS TD PT72
1.0000 | MEDICATED_PATCH | TRANSDERMAL | Status: DC
  Administered 2020-09-08: 1.5 mg via TRANSDERMAL
  Filled 2020-09-08: qty 1

## 2020-09-08 MED ORDER — FENTANYL CITRATE (PF) 100 MCG/2ML IJ SOLN
INTRAMUSCULAR | Status: DC | PRN
  Administered 2020-09-08: 50 ug via INTRAVENOUS

## 2020-09-08 MED ORDER — DEXAMETHASONE SODIUM PHOSPHATE 10 MG/ML IJ SOLN
INTRAMUSCULAR | Status: AC
  Filled 2020-09-08: qty 1

## 2020-09-08 MED ORDER — SUCCINYLCHOLINE CHLORIDE 200 MG/10ML IV SOSY
PREFILLED_SYRINGE | INTRAVENOUS | Status: DC | PRN
  Administered 2020-09-08: 140 mg via INTRAVENOUS

## 2020-09-08 MED ORDER — LACTATED RINGERS IV SOLN: INTRAVENOUS | Status: DC

## 2020-09-08 MED ORDER — MIDAZOLAM HCL 2 MG/2ML IJ SOLN: 0.5000 mg | Freq: Once | INTRAMUSCULAR | Status: DC | PRN

## 2020-09-08 MED ORDER — SCOPOLAMINE 1 MG/3DAYS TD PT72
MEDICATED_PATCH | TRANSDERMAL | Status: AC
  Filled 2020-09-08: qty 1

## 2020-09-08 MED ORDER — MIDAZOLAM HCL 5 MG/5ML IJ SOLN
INTRAMUSCULAR | Status: DC | PRN
  Administered 2020-09-08: 2 mg via INTRAVENOUS

## 2020-09-08 MED ORDER — FENTANYL CITRATE (PF) 250 MCG/5ML IJ SOLN
INTRAMUSCULAR | Status: AC
  Filled 2020-09-08: qty 5

## 2020-09-08 MED ORDER — MEPERIDINE HCL 25 MG/ML IJ SOLN: 6.2500 mg | INTRAMUSCULAR | Status: DC | PRN

## 2020-09-08 MED ORDER — LIDOCAINE 2% (20 MG/ML) 5 ML SYRINGE
INTRAMUSCULAR | Status: DC | PRN
  Administered 2020-09-08: 100 mg via INTRAVENOUS

## 2020-09-08 MED ORDER — ACETAMINOPHEN 500 MG PO TABS
1000.0000 mg | ORAL_TABLET | ORAL | Status: AC
  Administered 2020-09-08: 1000 mg via ORAL
  Filled 2020-09-08: qty 2

## 2020-09-08 MED ORDER — CHLORHEXIDINE GLUCONATE 0.12 % MT SOLN
15.0000 mL | Freq: Once | OROMUCOSAL | Status: AC
  Administered 2020-09-08: 15 mL via OROMUCOSAL
  Filled 2020-09-08: qty 15

## 2020-09-08 MED ORDER — ACETAMINOPHEN 500 MG PO TABS: 1000.0000 mg | ORAL_TABLET | Freq: Once | ORAL | Status: AC

## 2020-09-08 SURGICAL SUPPLY — 17 items
ABLATOR SURESOUND NOVASURE (ABLATOR) ×2 IMPLANT
CATH ROBINSON RED A/P 16FR (CATHETERS) ×2 IMPLANT
CNTNR URN SCR LID CUP LEK RST (MISCELLANEOUS) ×1 IMPLANT
CONT SPEC 4OZ STRL OR WHT (MISCELLANEOUS) ×2
DECANTER SPIKE VIAL GLASS SM (MISCELLANEOUS) ×2 IMPLANT
GLOVE SURG ENC MOIS LTX SZ7 (GLOVE) ×2 IMPLANT
GLOVE SURG UNDER POLY LF SZ7 (GLOVE) ×4 IMPLANT
GOWN STRL REUS W/ TWL LRG LVL3 (GOWN DISPOSABLE) ×2 IMPLANT
GOWN STRL REUS W/ TWL XL LVL3 (GOWN DISPOSABLE) ×1 IMPLANT
GOWN STRL REUS W/TWL LRG LVL3 (GOWN DISPOSABLE) ×4
GOWN STRL REUS W/TWL XL LVL3 (GOWN DISPOSABLE) ×2
KIT PROCEDURE FLUENT (KITS) ×2 IMPLANT
KIT TURNOVER KIT B (KITS) ×2 IMPLANT
PACK VAGINAL MINOR WOMEN LF (CUSTOM PROCEDURE TRAY) ×2 IMPLANT
PAD OB MATERNITY 4.3X12.25 (PERSONAL CARE ITEMS) ×2 IMPLANT
TOWEL GREEN STERILE FF (TOWEL DISPOSABLE) ×4 IMPLANT
UNDERPAD 30X36 HEAVY ABSORB (UNDERPADS AND DIAPERS) ×2 IMPLANT

## 2020-09-08 NOTE — Transfer of Care (Signed)
Immediate Anesthesia Transfer of Care Note  Patient: Markieta Brisby  Procedure(s) Performed: HYSTEROSCOPY WITH NOVASURE (Uterus) DILATATION AND CURETTAGE (Uterus)  Patient Location: PACU  Anesthesia Type:General  Level of Consciousness: awake, alert  and oriented  Airway & Oxygen Therapy: Patient Spontanous Breathing and Patient connected to face mask oxygen  Post-op Assessment: Report given to RN and Post -op Vital signs reviewed and stable  Post vital signs: Reviewed and stable  Last Vitals:  Vitals Value Taken Time  BP 157/99 09/08/20 0928  Temp 36.5 C 09/08/20 0928  Pulse 81 09/08/20 0930  Resp 18 09/08/20 0930  SpO2 100 % 09/08/20 0930  Vitals shown include unvalidated device data.  Last Pain:  Vitals:   09/08/20 0621  TempSrc:   PainSc: 0-No pain         Complications: No notable events documented.

## 2020-09-08 NOTE — Op Note (Signed)
09/08/2020  9:39 AM  PATIENT:  Sharon Rivers  50 y.o. female  PRE-OPERATIVE DIAGNOSIS:  Abnormal uterine bleeding  POST-OPERATIVE DIAGNOSIS:  Abnormal uterine bleeding  PROCEDURE:  Procedure(s): HYSTEROSCOPY WITH NOVASURE (N/A) DILATATION AND CURETTAGE (N/A)  SURGEON:  Surgeon(s) and Role:    * Lavonia Drafts, MD - Primary  ANESTHESIA:   general and paracervical block  EBL:  50 mL   BLOOD ADMINISTERED:none  DRAINS: none   LOCAL MEDICATIONS USED:  MARCAINE     SPECIMEN:  Source of Specimen:  endometrial curettings  DISPOSITION OF SPECIMEN:  PATHOLOGY  COUNTS:  YES  TOURNIQUET:  * No tourniquets in log *  DICTATION: .Note written in EPIC  PLAN OF CARE: Discharge to home after PACU  PATIENT DISPOSITION:  PACU - hemodynamically stable.   Delay start of Pharmacological VTE agent (>24hrs) due to surgical blood loss or risk of bleeding: Pt on long term anticoagulation   Complications : none immediate   The risks, benefits, and alternatives of surgery were explained, understood, and accepted. The consents were signed and all questions were answered.   The patient was taken to the operating room and general anesthesia was applied without complication. She was placed in the dorsal lithotomy position and her vagina and abdomen were prepped and draped in the usual sterile fashion. A bimanual exam revealed a normal size and shape anteverted mobile uterus. Her adnexa were non-enlarged.   A bivalved speculum was placed in the patients' vagina and the anterior lip of the cervix was grasped with a single toothed tenaculum. A paracervical block was performed at 5 and 7 o'clock with 20cc of 0.5% Marcaine.   The endometrial cavity was sounded to 9.5 cm and the endocervical length measured 3cm. A hysteroscope was inserted and the endometrium had a diffuse proliferative appearance. The ostia on both sides were noted.  The scope was removed and a sharp currete was used to scape the  lining of the uterus until a gritty texture was noted throughout.  Specimens were sent to pathology.  The NovaSure device was then inserted and seated using 6.5cm as the cavity length and 3.8cm as the cavity width.  The total activation time was 44 sec at a power of .  The hysteroscope was reinserted and an even burn pattern was noted to the fundus.  The single toothed tenaculum was removed at the end of the case and no bleeding was noted from the cervix.   The patient was extubated and taken to the recovery room in stable condition.  Sponge, lap and instrument counts were correct.  There were no complications.   Hutson Luft L. Harraway-Smith, M.D., Cherlynn June

## 2020-09-08 NOTE — H&P (Signed)
Preoperative History and Physical  Sharon Rivers is a 50 y.o. [id number] here for surgical management of abnormal uterine bleeding.   Proposed surgery: hysteroscopy with dilation and curettage with endometrial ablation using Novasure.   Past Medical History:  Diagnosis Date   Anxiety    Back pain    Chest pain    a. 07/2015 Myoview: EF 71%, medium size, mild intensity, partially reversible septal defect with overlying breast attenuation -> likely artifact. No significant reversible ischemia.   Constipation    Depression    Edema    feet and legs   Frequent headaches    GERD (gastroesophageal reflux disease)    History of DVT (deep vein thrombosis) 2018   Hypertension    Hypothyroidism    Insulin resistance    Joint pain    Migraines    OSA (obstructive sleep apnea) 03/17/2016   Osteoarthritis    Panic anxiety syndrome    PONV (postoperative nausea and vomiting)    Pulmonary embolism ([place]) 2022   SOB (shortness of breath)    a. 04/2016 Echo: EF 60-65%, Gr1 DD.   Swallowing difficulty    Past Surgical History:  Procedure Laterality Date   CESAREAN SECTION  2001 & 2002   ELBOW SURGERY     MINOR CARPAL TUNNEL     pinched nerve in elbow   WISDOM TOOTH EXTRACTION     OB History     Gravida  3   Para  2   Term  2   Preterm      AB  1   Living  2      SAB  0   IAB      Ectopic      Multiple      Live Births  2          Patient denies any cervical dysplasia or STIs. Medications Prior to Admission  Medication Sig Dispense Refill Last Dose   acetaminophen (TYLENOL) 325 MG tablet Take 2 tablets (650 mg total) by mouth every 6 (six) hours as needed for mild pain (or Fever >/= 101). 20 tablet 0 Past Month   ALPRAZolam (XANAX) 0.25 MG tablet TAKE 1 TABLET BY MOUTH TWICE DAILY AS NEEDED FOR ANXIETY AND FOR SLEEP. DO NOT TAKE WITH HYDROCODONE. (Patient taking differently: Take 0.25 mg by mouth at bedtime. DO NOT TAKE WITH HYDROCODONE.) 30 tablet 0 09/07/2020 at 2000    amLODipine (NORVASC) 5 MG tablet Take 1 tablet (5 mg total) by mouth daily. (Patient taking differently: Take 5 mg by mouth in the morning.) 90 tablet 1 09/08/2020 at 0410   azelastine (ASTELIN) 0.1 % nasal spray Place 1 spray into both nostrils 2 (two) times daily. Use in each nostril as directed (Patient taking differently: Place 1 spray into both nostrils 2 (two) times daily as needed for allergies. Use in each nostril as directed) 30 mL 5 Past Month   docusate sodium (COLACE) 100 MG capsule Take 100 mg by mouth 2 (two) times daily as needed for mild constipation.   Past Month   EUTHYROX 100 MCG tablet Take 1 tablet by mouth once daily (Patient taking differently: Take 100 mcg by mouth daily before breakfast.) 90 tablet 0 09/08/2020 at 0410   fluticasone (FLONASE) 50 MCG/ACT nasal spray Place 2 sprays into both nostrils daily. (Patient taking differently: Place 2 sprays into both nostrils daily as needed for allergies.) 16 g 6 Past Month   montelukast (SINGULAIR) 10 MG tablet Take 1 tablet (10  mg total) by mouth at bedtime. 90 tablet 1 09/07/2020 at 2000   nystatin (MYCOSTATIN/NYSTOP) powder Apply 1 application topically 3 (three) times daily. (Patient taking differently: Apply 1 application topically 3 (three) times daily as needed (skin irritation (yeast)).) 60 g 1 Past Week   oxybutynin (DITROPAN-XL) 5 MG 24 hr tablet Take 1 tablet (5 mg total) by mouth at bedtime. (Patient taking differently: Take 5 mg by mouth in the morning.) 30 tablet 5 09/08/2020 at 0410   pantoprazole (PROTONIX) 40 MG tablet Take 1 tablet by mouth twice daily (Patient taking differently: Take 40 mg by mouth in the morning and at bedtime.) 60 tablet 1 09/08/2020 at 0410   Polyvinyl Alcohol-Povidone PF (REFRESH) 1.4-0.6 % SOLN Place 1-2 drops into both eyes 3 (three) times daily as needed (dry/irritated eyes).      rivaroxaban (XARELTO) 20 MG TABS tablet Take 1 tablet (20 mg total) by mouth daily with supper. 30 tablet 5 09/05/2020 at PM  dose   sertraline (ZOLOFT) 100 MG tablet Take 1 tablet (100 mg total) by mouth daily. (Patient taking differently: Take 100 mg by mouth in the morning.) 90 tablet 1 09/08/2020 at 0410   topiramate (TOPAMAX) 50 MG tablet Take 1 tablet (50 mg total) by mouth 2 (two) times daily. 60 tablet 12 09/08/2020 at 0410   traZODone (DESYREL) 100 MG tablet Take 1 tablet (100 mg total) by mouth at bedtime. 90 tablet 1 09/07/2020 at 200   valsartan (DIOVAN) 160 MG tablet Take 1 tablet (160 mg total) by mouth daily. (Patient taking differently: Take 160 mg by mouth every evening.) 90 tablet 3 09/07/2020 at 2000   ondansetron (ZOFRAN) 4 MG tablet Take 1 tablet (4 mg total) by mouth every 6 (six) hours as needed for nausea. 20 tablet 0 More than a month   rizatriptan (MAXALT) 10 MG tablet As needed for headache; may repeat in 2 hours if needed; max 2 per day or 8 per month 8 tablet 6 More than a month   senna-docusate (SENOKOT-S) 8.6-50 MG tablet Take 1 tablet by mouth at bedtime as needed for mild constipation. (Patient not taking: Reported on 09/02/2020) 30 tablet 0 Not Taking    Allergies  Allergen Reactions   Megace [Megestrol] Other (See Comments)    Pt is s/p PE and DVT on low dose Megace   Camphor Other (See Comments)    Burning sensation.   Menthol Other (See Comments)    Burning sensation.   Other Other (See Comments)   Adhesive [Tape] Other (See Comments)    Skin Burn.   Biofreeze [Menthol (Topical Analgesic)] Other (See Comments)    Skin Burn.   Folic Acid Hives   Lisinopril Cough   Sulfa Antibiotics Rash   Social History:   reports that she quit smoking about 30 years ago. Her smoking use included cigarettes. She has never used smokeless tobacco. She reports that she does not drink alcohol and does not use drugs. Family History  Problem Relation Age of Onset   Hypertension Mother        Living   Arthritis Mother    Thyroid disease Mother    Heart disease Mother        MI at age 68   Heart attack  Mother    Migraines Mother    Depression Mother    Obesity Mother    Congestive Heart Failure Father        ?CHF   Hypertension Maternal Grandmother  Parkinson's disease Maternal Grandmother    Alzheimer's disease Maternal Grandmother    Cancer Maternal Grandfather    Hypertension Maternal Grandfather    Hypertension Paternal Grandmother    Hypertension Paternal Grandfather    Gout Brother    ADD / ADHD Son        x1   Other Son        #2-Unknown   Diabetes Neg Hx     Review of Systems: Noncontributory  PHYSICAL EXAM: Blood pressure (!) 148/99, pulse 77, temperature 97.8 F (36.6 C), temperature source Oral, resp. rate 18, height '5\' 2"'$  (1.575 m), weight 131.5 kg, SpO2 98 %. General appearance - alert, well appearing, and in no distress Chest - clear to auscultation, no wheezes, rales or rhonchi, symmetric air entry Heart - normal rate and regular rhythm Abdomen - soft, nontender, nondistended, no masses or organomegaly Pelvic - examination not indicated Extremities - peripheral pulses normal, no pedal edema, no clubbing or cyanosis  Labs: Results for orders placed or performed during the hospital encounter of 09/08/20 (from the past 336 hour(s))  Pregnancy, urine POC   Collection Time: 09/08/20  6:18 AM  Result Value Ref Range   Preg Test, Ur NEGATIVE NEGATIVE  CBC   Collection Time: 09/08/20  6:30 AM  Result Value Ref Range   WBC 5.8 4.0 - 10.5 K/uL   RBC 4.13 3.87 - 5.11 MIL/uL   Hemoglobin 12.0 12.0 - 15.0 g/dL   HCT 36.0 36.0 - 46.0 %   MCV 87.2 80.0 - 100.0 fL   MCH 29.1 26.0 - 34.0 pg   MCHC 33.3 30.0 - 36.0 g/dL   RDW 14.1 11.5 - 15.5 %   Platelets 249 150 - 400 K/uL   nRBC 0.0 0.0 - 0.2 %  Results for orders placed or performed in visit on 08/27/20 (from the past 336 hour(s))  CBC with Differential (Cancer Center Only)   Collection Time: 08/27/20  1:05 PM  Result Value Ref Range   WBC Count 7.0 4.0 - 10.5 K/uL   RBC 4.37 3.87 - 5.11 MIL/uL    Hemoglobin 12.6 12.0 - 15.0 g/dL   HCT 37.8 36.0 - 46.0 %   MCV 86.5 80.0 - 100.0 fL   MCH 28.8 26.0 - 34.0 pg   MCHC 33.3 30.0 - 36.0 g/dL   RDW 13.9 11.5 - 15.5 %   Platelet Count 274 150 - 400 K/uL   nRBC 0.0 0.0 - 0.2 %   Neutrophils Relative % 68 %   Neutro Abs 4.8 1.7 - 7.7 K/uL   Lymphocytes Relative 19 %   Lymphs Abs 1.3 0.7 - 4.0 K/uL   Monocytes Relative 7 %   Monocytes Absolute 0.5 0.1 - 1.0 K/uL   Eosinophils Relative 5 %   Eosinophils Absolute 0.3 0.0 - 0.5 K/uL   Basophils Relative 0 %   Basophils Absolute 0.0 0.0 - 0.1 K/uL   Immature Granulocytes 1 %   Abs Immature Granulocytes 0.07 0.00 - 0.07 K/uL  CMP (Cancer Center only)   Collection Time: 08/27/20  1:05 PM  Result Value Ref Range   Sodium 139 135 - 145 mmol/L   Potassium 3.7 3.5 - 5.1 mmol/L   Chloride 105 98 - 111 mmol/L   CO2 28 22 - 32 mmol/L   Glucose, Bld 89 70 - 99 mg/dL   BUN 14 6 - 20 mg/dL   Creatinine 0.97 0.44 - 1.00 mg/dL   Calcium 9.5 8.9 - 10.3 mg/dL  Total Protein 7.3 6.5 - 8.1 g/dL   Albumin 4.1 3.5 - 5.0 g/dL   AST 13 (L) 15 - 41 U/L   ALT 15 0 - 44 U/L   Alkaline Phosphatase 64 38 - 126 U/L   Total Bilirubin 0.5 0.3 - 1.2 mg/dL   GFR, Estimated >60 >60 mL/min   Anion gap 6 5 - 15  Lupus anticoagulant panel*   Collection Time: 08/27/20  1:05 PM  Result Value Ref Range   PTT Lupus Anticoagulant 31.5 0.0 - 51.9 sec   DRVVT 65.1 (H) 0.0 - 47.0 sec   Lupus Anticoag Interp Comment:   Homocysteine, serum   Collection Time: 08/27/20  1:05 PM  Result Value Ref Range   Homocysteine 17.8 (H) 0.0 - 14.5 umol/L  dRVVT Mix   Collection Time: 08/27/20  1:05 PM  Result Value Ref Range   dRVVT Mix 57.3 (H) 0.0 - 40.4 sec  dRVVT Confirm   Collection Time: 08/27/20  1:05 PM  Result Value Ref Range   dRVVT Confirm 1.3 (H) 0.8 - 1.2 ratio    Imaging Studies: DG Chest 2 View  Result Date: 08/15/2020 CLINICAL DATA:  Preop evaluation. EXAM: CHEST - 2 VIEW COMPARISON:  05/14/2020 FINDINGS:  Lungs are adequately inflated and otherwise clear. Cardiomediastinal silhouette and remainder of the exam is unchanged. IMPRESSION: No active cardiopulmonary disease. Electronically Signed   By: Marin Olp M.D.   On: 08/15/2020 17:20   CT ANGIO CHEST PE W OR WO CONTRAST  Result Date: 08/28/2020 CLINICAL DATA:  History of pulmonary embolism. EXAM: CT ANGIOGRAPHY CHEST WITH CONTRAST TECHNIQUE: Multidetector CT imaging of the chest was performed using the standard protocol during bolus administration of intravenous contrast. Multiplanar CT image reconstructions and MIPs were obtained to evaluate the vascular anatomy. CONTRAST:  28m OMNIPAQUE IOHEXOL 350 MG/ML SOLN COMPARISON:  May 12, 2020. FINDINGS: Cardiovascular: Satisfactory opacification of the pulmonary arteries to the segmental level. No evidence of pulmonary embolism. Normal heart size. No pericardial effusion. Mediastinum/Nodes: No enlarged mediastinal, hilar, or axillary lymph nodes. Thyroid gland, trachea, and esophagus demonstrate no significant findings. Lungs/Pleura: Lungs are clear. No pleural effusion or pneumothorax. Upper Abdomen: Large gallstone is noted. Musculoskeletal: No chest wall abnormality. No acute or significant osseous findings. Review of the MIP images confirms the above findings. IMPRESSION: No definite evidence of pulmonary embolus. Large gallstone is noted. Electronically Signed   By: JMarijo ConceptionM.D.   On: 08/28/2020 16:54   UK[place]Venous Img Lower Unilateral Right (DVT)  Result Date: 08/28/2020 CLINICAL DATA:  Follow-up right popliteal vein and posterior tibial vein DVT. EXAM: RIGHT LOWER EXTREMITY VENOUS DOPPLER ULTRASOUND TECHNIQUE: Gray-scale sonography with graded compression, as well as color Doppler and duplex ultrasound were performed to evaluate the lower extremity deep venous systems from the level of the common femoral vein and including the common femoral, femoral, profunda femoral, popliteal and calf veins  including the posterior tibial, peroneal and gastrocnemius veins when visible. The superficial great saphenous vein was also interrogated. Spectral Doppler was utilized to evaluate flow at rest and with distal augmentation maneuvers in the common femoral, femoral and popliteal veins. COMPARISON:  Ultrasound from May 30, 2017. FINDINGS: Contralateral Common Femoral Vein: Respiratory phasicity is normal and symmetric with the symptomatic side. No evidence of thrombus. Normal compressibility. Common Femoral Vein: No evidence of thrombus. Normal compressibility, respiratory phasicity and response to augmentation. Saphenofemoral Junction: No evidence of thrombus. Normal compressibility and flow on color Doppler imaging. Profunda Femoral Vein: No  evidence of thrombus. Normal compressibility and flow on color Doppler imaging. Femoral Vein: No evidence of thrombus. Limited visualization distally. Popliteal Vein: There is partial compressibility of the popliteal vein and partial color flow, compatible with nonocclusive thrombosis. Calf Veins: Evaluation is limited due to patient body habitus. The peroneal vein is not visualized. Limited visualization of the posterior tibial in the mid calf and ankle without definite thrombosis (not visualized in upper calf). Superficial Great Saphenous Vein: No evidence of thrombus. Normal compressibility. IMPRESSION: 1. Evidence of nonocclusive deep venous thrombosis involving the right popliteal vein. 2. Limited visualization of the posterior tibial in the mid calf and ankle without definite thrombosis there (not visualized in upper calf). The peroneal vein is not visualized. Electronically Signed   By: Margaretha Sheffield MD   On: 08/28/2020 18:11    Assessment: Patient Active Problem List   Diagnosis Date Noted   Abnormal uterine bleeding (AUB) 09/08/2020   Intertrigo 08/19/2020   Allergic rhinitis 08/19/2020   Preoperative evaluation to rule out surgical contraindication  08/14/2020   Anxiety    Back pain    Chest pain    Constipation    Edema    Frequent headaches    Hypothyroidism    Joint pain    Osteoarthritis    Panic anxiety syndrome    Swallowing difficulty    SOB (shortness of breath)    Acute deep vein thrombosis (DVT) of right lower extremity ([place]) 05/28/2020   Multiple pulmonary emboli ([place]) 05/27/2020   Acute pulmonary embolism ([place]) 05/12/2020   Pulmonary embolism and infarction ([place])    Trigeminal neuralgia 04/21/2020   Synovitis of toe 04/09/2020   Herniated nucleus pulposus, lumbar 02/13/2020   Pulmonary embolism ([place]) 2022   Protrusion of intervertebral disc of lumbosacral region 11/05/2019   Left lumbar radiculitis 10/01/2019   Body mass index (BMI) 50.0-59.9, adult ([place]) 10/01/2019   Metatarsalgia of both feet 02/25/2018   Essential hypertension 03/08/2017   Other specified hypothyroidism 03/08/2017   Class 3 obesity with serious comorbidity and body mass index (BMI) of 45.0 to 49.9 in adult 09/26/2016   Insulin resistance 06/13/2016   Vitamin D deficiency 04/12/2016   OSA (obstructive sleep apnea) 03/17/2016   Insomnia 02/23/2016   GERD (gastroesophageal reflux disease) 02/23/2016   Migraines 02/23/2016   Depression 02/23/2016   History of DVT (deep vein thrombosis) 2018   Hypertension 07/22/2015   Hypothyroid 07/22/2015   Family history of early CAD 07/22/2015   Bilateral carpal tunnel syndrome 04/30/2015   Headache 12/12/2013   Heartburn 12/12/2013   Morbid obesity ([place]) 09/12/2013   Chondromalacia patellae 09/12/2013   Arthropathy 07/14/2012    Plan: Patient will undergo surgical management with hysteroscopy with dilation and curettage with endometrial ablation using Novasure.   The risks of surgery were discussed in detail with the patient including but not limited to: bleeding which may require transfusion or reoperation; infection which may require antibiotics; injury to surrounding organs which may involve  bowel, bladder, ureters ; need for additional procedures including laparoscopy or laparotomy; thromboembolic phenomenon, surgical site problems and other postoperative/anesthesia complications. Likelihood of success in alleviating the patient's condition was discussed. Routine postoperative instructions will be reviewed with the patient and her family in detail after surgery.  The patient concurred with the proposed plan, giving informed written consent for the surgery.  Patient has been NPO since last night she will remain NPO for procedure.  Anesthesia and OR aware.  Preoperative prophylactic antibiotics and SCDs ordered on call  to the OR.  To OR when ready.  Jonetta Dagley L. Ihor Dow, M.D., [facility] [place] 09/08/2020 8:40 AM

## 2020-09-08 NOTE — Brief Op Note (Signed)
09/08/2020  9:39 AM  PATIENT:  Sharon Rivers  50 y.o. female  PRE-OPERATIVE DIAGNOSIS:  Abnormal uterine bleeding  POST-OPERATIVE DIAGNOSIS:  Abnormal uterine bleeding  PROCEDURE:  Procedure(s): HYSTEROSCOPY WITH NOVASURE (N/A) DILATATION AND CURETTAGE (N/A)  SURGEON:  Surgeon(s) and Role:    * Lavonia Drafts, MD - Primary  ANESTHESIA:   general and paracervical block  EBL:  50 mL   BLOOD ADMINISTERED:none  DRAINS: none   LOCAL MEDICATIONS USED:  MARCAINE     SPECIMEN:  Source of Specimen:  endometrial curettings  DISPOSITION OF SPECIMEN:  PATHOLOGY  COUNTS:  YES  TOURNIQUET:  * No tourniquets in log *  DICTATION: .Note written in EPIC  PLAN OF CARE: Discharge to home after PACU  PATIENT DISPOSITION:  PACU - hemodynamically stable.   Delay start of Pharmacological VTE agent (>24hrs) due to surgical blood loss or risk of bleeding: Pt on long term anticoagulation   Complications : none immediate  Georgios Kina L. Harraway-Smith, M.D., [facility]

## 2020-09-08 NOTE — Anesthesia Postprocedure Evaluation (Signed)
Anesthesia Post Note  Patient: Oriel Gal  Procedure(s) Performed: HYSTEROSCOPY WITH NOVASURE (Uterus) DILATATION AND CURETTAGE (Uterus)     Patient location during evaluation: PACU Anesthesia Type: General Level of consciousness: awake and alert, patient cooperative and oriented Pain management: pain level controlled Vital Signs Assessment: post-procedure vital signs reviewed and stable Respiratory status: spontaneous breathing, nonlabored ventilation and respiratory function stable Cardiovascular status: blood pressure returned to baseline and stable Postop Assessment: no apparent nausea or vomiting and adequate PO intake Anesthetic complications: no   No notable events documented.  Last Vitals:  Vitals:   09/08/20 0943 09/08/20 0958  BP: (!) 154/97 (!) 146/87  Pulse: 68 65  Resp: 13 18  Temp:    SpO2: 100% 100%    Last Pain:  Vitals:   09/08/20 0958  TempSrc:   PainSc: 0-No pain                 Ahni Bradwell,E. Carrolyn Hilmes

## 2020-09-08 NOTE — Anesthesia Procedure Notes (Addendum)
Procedure Name: Intubation Date/Time: 09/08/2020 9:05 AM Performed by: Terrence Dupont, CRNA Pre-anesthesia Checklist: Patient identified, Emergency Drugs available, Suction available and Patient being monitored Patient Re-evaluated:Patient Re-evaluated prior to induction Oxygen Delivery Method: Circle system utilized Preoxygenation: Pre-oxygenation with 100% oxygen Induction Type: IV induction Ventilation: Mask ventilation without difficulty Laryngoscope Size: Glidescope and 4 Grade View: Grade I Tube type: Oral Tube size: 7.0 mm Number of attempts: 1 Airway Equipment and Method: Stylet and Oral airway Placement Confirmation: ETT inserted through vocal cords under direct vision, positive ETCO2 and breath sounds checked- equal and bilateral Secured at: 21 cm Tube secured with: Tape Dental Injury: Teeth and Oropharynx as per pre-operative assessment  Comments: Teeth remain in same condition as pre-induction

## 2020-09-09 ENCOUNTER — Encounter (HOSPITAL_COMMUNITY): Payer: Self-pay | Admitting: Obstetrics & Gynecology

## 2020-09-09 LAB — SURGICAL PATHOLOGY

## 2020-09-14 ENCOUNTER — Other Ambulatory Visit: Payer: Self-pay | Admitting: Family

## 2020-09-15 NOTE — Telephone Encounter (Signed)
Requesting: alprazolam 0.'25mg'$  Contract: 07/22/2019 UDS: 04/21/20 Last Visit: 08/14/2020 Next Visit: 10/30/2020 Last Refill: 08/21/2020 #30 and 0RF  Please Advise

## 2020-09-16 ENCOUNTER — Telehealth: Payer: 59 | Admitting: Obstetrics & Gynecology

## 2020-10-13 ENCOUNTER — Ambulatory Visit (INDEPENDENT_AMBULATORY_CARE_PROVIDER_SITE_OTHER): Payer: 59 | Admitting: Cardiology

## 2020-10-13 ENCOUNTER — Other Ambulatory Visit: Payer: Self-pay

## 2020-10-13 VITALS — BP 153/90 | HR 70 | Ht 62.0 in | Wt 301.0 lb

## 2020-10-13 DIAGNOSIS — I10 Essential (primary) hypertension: Secondary | ICD-10-CM | POA: Diagnosis not present

## 2020-10-13 DIAGNOSIS — G4733 Obstructive sleep apnea (adult) (pediatric): Secondary | ICD-10-CM | POA: Diagnosis not present

## 2020-10-13 MED ORDER — VALSARTAN 320 MG PO TABS: 320.0000 mg | ORAL_TABLET | Freq: Every day | ORAL | 3 refills | Status: DC

## 2020-10-13 NOTE — Progress Notes (Signed)
Cardiology Office Note:    Date:  10/13/2020   ID:  Sharon Rivers, DOB 04-22-1970, MRN [id number]  PCP:  [name], NP  Cardiologist:  Berniece Salines, DO  Electrophysiologist:  None   Referring MD: [name], NP    History of Present Illness:    Sharon Rivers is a 50 y.o. female with a hx of DVT with pulmonary embolus and on Xarelto, morbid obesity, hypertension, OSA now using her CPAP and is very happy with this, is here today for follow-up visit.   I saw the patient for the first time in June 2022 at that time she reported that she had been experiencing some coughing she was on lisinopril suspicious that this was playing a role we will stop lisinopril started patient on valsartan.  Coughing has stopped and she had tolerated the valsartan.  Today the valsartan has been titrated up she takes 160 mg daily.  Past Medical History:  Diagnosis Date   Anxiety    Back pain    Chest pain    a. 07/2015 Myoview: EF 71%, medium size, mild intensity, partially reversible septal defect with overlying breast attenuation -> likely artifact. No significant reversible ischemia.   Constipation    Depression    Edema    feet and legs   Frequent headaches    GERD (gastroesophageal reflux disease)    History of DVT (deep vein thrombosis) 2018   Hypertension    Hypothyroidism    Insulin resistance    Joint pain    Migraines    OSA (obstructive sleep apnea) 03/17/2016   Osteoarthritis    Panic anxiety syndrome    PONV (postoperative nausea and vomiting)    Pulmonary embolism ([place]) 2022   SOB (shortness of breath)    a. 04/2016 Echo: EF 60-65%, Gr1 DD.   Swallowing difficulty     Past Surgical History:  Procedure Laterality Date   CESAREAN SECTION  2001 & 2002   DILATION AND CURETTAGE OF UTERUS N/A 09/08/2020   Procedure: DILATATION AND CURETTAGE;  Surgeon: Lavonia Drafts, MD;  Location: [facility];  Service: Gynecology;  Laterality: N/A;   ELBOW SURGERY     HYSTEROSCOPY  WITH NOVASURE N/A 09/08/2020   Procedure: HYSTEROSCOPY WITH NOVASURE;  Surgeon: Lavonia Drafts, MD;  Location: [facility];  Service: Gynecology;  Laterality: N/A;   MINOR CARPAL TUNNEL     pinched nerve in elbow   WISDOM TOOTH EXTRACTION      Current Medications: Current Meds  Medication Sig   valsartan (DIOVAN) 320 MG tablet Take 1 tablet (320 mg total) by mouth daily.     Allergies:   Megace [megestrol], Camphor, Menthol, Other, Adhesive [tape], Biofreeze [menthol (topical analgesic)], Folic acid, Lisinopril, and Sulfa antibiotics   Social History   Socioeconomic History   Marital status: Single    Spouse name: Not on file   Number of children: 2   Years of education: Not on file   Highest education level: Associate degree: occupational, Hotel manager, or vocational program  Occupational History   Occupation: Loss adjuster, chartered    Comment: [employer]  Tobacco Use   Smoking status: Former    Types: Cigarettes    Quit date: 1992    Years since quitting: 30.7   Smokeless tobacco: Never   Tobacco comments:    quit 1992  Substance and Sexual Activity   Alcohol use: No    Alcohol/week: 0.0 standard drinks   Drug use: No   Sexual activity: Yes    Partners:  Male    Birth control/protection: None  Other Topics Concern   Not on file  Social History Narrative   She has 2 children (grown). She did not retain custody of these children.   Lives with boyfriend in [place].   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   Social Determinants of Radio broadcast assistant Strain: Not on [employer] Insecurity: Not on file  Transportation Needs: Not on file  Physical Activity: Not on file  Stress: Not on file  Social Connections: Not on file     Family History: The patient's family history includes ADD / ADHD in her son; Alzheimer's disease in her maternal grandmother; Arthritis in her mother; Cancer in her maternal grandfather; Congestive Heart Failure in her father;  Depression in her mother; Gout in her brother; Heart attack in her mother; Heart disease in her mother; Hypertension in her maternal grandfather, maternal grandmother, mother, paternal grandfather, and paternal grandmother; Migraines in her mother; Obesity in her mother; Other in her son; Parkinson's disease in her maternal grandmother; Thyroid disease in her mother. There is no history of Diabetes.  ROS:   Review of Systems  Constitution: Negative for decreased appetite, fever and weight gain.  HENT: Negative for congestion, ear discharge, hoarse voice and sore throat.   Eyes: Negative for discharge, redness, vision loss in right eye and visual halos.  Cardiovascular: Negative for chest pain, dyspnea on exertion, leg swelling, orthopnea and palpitations.  Respiratory: Negative for cough, hemoptysis, shortness of breath and snoring.   Endocrine: Negative for heat intolerance and polyphagia.  Hematologic/Lymphatic: Negative for bleeding problem. Does not bruise/bleed easily.  Skin: Negative for flushing, nail changes, rash and suspicious lesions.  Musculoskeletal: Negative for arthritis, joint pain, muscle cramps, myalgias, neck pain and stiffness.  Gastrointestinal: Negative for abdominal pain, bowel incontinence, diarrhea and excessive appetite.  Genitourinary: Negative for decreased libido, genital sores and incomplete emptying.  Neurological: Negative for brief paralysis, focal weakness, headaches and loss of balance.  Psychiatric/Behavioral: Negative for altered mental status, depression and suicidal ideas.  Allergic/Immunologic: Negative for HIV exposure and persistent infections.    EKGs/Labs/Other Studies Reviewed:    The following studies were reviewed today:   EKG: None today   TTE 05/13/2020 IMPRESSIONS   1. Left ventricular ejection fraction, by estimation, is 60 to 65%. The left ventricle has normal function. The left ventricle has no regional wall motion abnormalities. There  is mild left ventricular hypertrophy. Left ventricular diastolic parameters  are consistent with Grade I diastolic dysfunction (impaired relaxation).  2. Right ventricular systolic function is normal. The right ventricular size is normal.   3. The mitral valve is grossly normal. No evidence of mitral valve regurgitation.   4. The aortic valve was not well visualized. Aortic valve regurgitation is not visualized.  Conclusion(s)/Recommendation(s): No evidence of right heart strain.  Recent Labs: 05/12/2020: B Natriuretic Peptide 7.5 07/17/2020: TSH 3.52 07/20/2020: Magnesium 2.1 08/27/2020: ALT 15; BUN 14; Creatinine 0.97; Potassium 3.7; Sodium 139 09/08/2020: Hemoglobin 12.0; Platelets 249  Recent Lipid Panel    Component Value Date/Time   CHOL 143 09/02/2019 0904   CHOL 158 03/08/2017 1108   TRIG 81.0 09/02/2019 0904   HDL 40.60 09/02/2019 0904   HDL 45 03/08/2017 1108   CHOLHDL 4 09/02/2019 0904   VLDL 16.2 09/02/2019 0904   LDLCALC 86 09/02/2019 0904   LDLCALC 100 (H) 03/08/2017 1108    Physical Exam:    VS:  BP (!) 153/90 (BP Location:  Left Arm)   Pulse 70   Ht '5\' 2"'$  (1.575 m)   Wt (!) 301 lb (136.5 kg)   SpO2 97%   BMI 55.05 kg/m     Wt Readings from Last 3 Encounters:  10/13/20 (!) 301 lb (136.5 kg)  09/08/20 290 lb (131.5 kg)  08/27/20 297 lb (134.7 kg)     GEN: Well nourished, well developed in no acute distress HEENT: Normal NECK: No JVD; No carotid bruits LYMPHATICS: No lymphadenopathy CARDIAC: S1S2 noted,RRR, no murmurs, rubs, gallops RESPIRATORY:  Clear to auscultation without rales, wheezing or rhonchi  ABDOMEN: Soft, non-tender, non-distended, +bowel sounds, no guarding. EXTREMITIES: No edema, No cyanosis, no clubbing MUSCULOSKELETAL:  No deformity  SKIN: Warm and dry NEUROLOGIC:  Alert and oriented x 3, non-focal PSYCHIATRIC:  Normal affect, good insight  ASSESSMENT:    1. Primary hypertension   2. OSA (obstructive sleep apnea)   3. Morbid obesity  ([place])    PLAN:     1.  She still is hypertensive we will continue the amlodipine 5 mg daily, increase valsartan to 320 mg daily, if needed if blood pressure continue to stay elevated I will add hydrochlorothiazide 12.5 mg to her regimen.   2.  The patient understands the need to lose weight with diet and exercise. We have discussed specific strategies for this.  3.  She is happy with the use of her CPAP.  4.  He is on Xarelto for pulmonary embolism.  The patient is in agreement with the above plan. The patient left the office in stable condition.  The patient will follow up in   Medication Adjustments/Labs and Tests Ordered: Current medicines are reviewed at length with the patient today.  Concerns regarding medicines are outlined above.  No orders of the defined types were placed in this encounter.  Meds ordered this encounter  Medications   valsartan (DIOVAN) 320 MG tablet    Sig: Take 1 tablet (320 mg total) by mouth daily.    Dispense:  90 tablet    Refill:  3    Patient Instructions  Medication Instructions:  Your physician has recommended you make the following change in your medication:  INCREASE: Valsartan 320 mg once daily *If you need a refill on your cardiac medications before your next appointment, please call your pharmacy*   Lab Work: None If you have labs (blood work) drawn today and your tests are completely normal, you will receive your results only by: [place] (if you have MyChart) OR A paper copy in the mail If you have any lab test that is abnormal or we need to change your treatment, we will call you to review the results.   Testing/Procedures: None   Follow-Up: At [facility], you and your health needs are our priority.  As part of our continuing mission to provide you with exceptional heart care, we have created designated Provider Care Teams.  These Care Teams include your primary Cardiologist (physician) and Advanced Practice  Providers (APPs -  Physician Assistants and Nurse Practitioners) who all work together to provide you with the care you need, when you need it.  We recommend signing up for the patient portal called "MyChart".  Sign up information is provided on this After Visit Summary.  MyChart is used to connect with patients for Virtual Visits (Telemedicine).  Patients are able to view lab/test results, encounter notes, upcoming appointments, etc.  Non-urgent messages can be sent to your provider as well.   To learn  more about what you can do with MyChart, go to [email or web address].    Your next appointment:   6 month(s)  The format for your next appointment:   In Person  Provider:   Berniece Salines, DO [address], [place], [place] [address]    Other Instructions     Adopting a Healthy Lifestyle.  Know what a healthy weight is for you (roughly BMI <25) and aim to maintain this   Aim for 7+ servings of fruits and vegetables daily   65-80+ fluid ounces of water or unsweet tea for healthy kidneys   Limit to max 1 drink of alcohol per day; avoid smoking/tobacco   Limit animal fats in diet for cholesterol and heart health - choose grass fed whenever available   Avoid highly processed foods, and foods high in saturated/trans fats   Aim for low stress - take time to unwind and care for your mental health   Aim for 150 min of moderate intensity exercise weekly for heart health, and weights twice weekly for bone health   Aim for 7-9 hours of sleep daily   When it comes to diets, agreement about the perfect plan isnt easy to find, even among the experts. Experts at the [employer] developed an idea known as the Healthy Eating Plate. Just imagine a plate divided into logical, healthy portions.   The emphasis is on diet quality:   Load up on vegetables and fruits - one-half of your plate: Aim for color and variety, and remember that potatoes dont count.   Go for  whole grains - one-quarter of your plate: Whole wheat, barley, wheat berries, quinoa, oats, brown rice, and foods made with them. If you want pasta, go with whole wheat pasta.   Protein power - one-quarter of your plate: Fish, chicken, beans, and nuts are all healthy, versatile protein sources. Limit red meat.   The diet, however, does go beyond the plate, offering a few other suggestions.   Use healthy plant oils, such as olive, canola, soy, corn, sunflower and peanut. Check the labels, and avoid partially hydrogenated oil, which have unhealthy trans fats.   If youre thirsty, drink water. Coffee and tea are good in moderation, but skip sugary drinks and limit milk and dairy products to one or two daily servings.   The type of carbohydrate in the diet is more important than the amount. Some sources of carbohydrates, such as vegetables, fruits, whole grains, and beans-are healthier than others.   Finally, stay active  Signed, Berniece Salines, DO  10/13/2020 5:13 PM    [facility]

## 2020-10-13 NOTE — Patient Instructions (Signed)
Medication Instructions:  Your physician has recommended you make the following change in your medication:  INCREASE: Valsartan 320 mg once daily *If you need a refill on your cardiac medications before your next appointment, please call your pharmacy*   Lab Work: None If you have labs (blood work) drawn today and your tests are completely normal, you will receive your results only by: [place] (if you have MyChart) OR A paper copy in the mail If you have any lab test that is abnormal or we need to change your treatment, we will call you to review the results.   Testing/Procedures: None   Follow-Up: At [facility], you and your health needs are our priority.  As part of our continuing mission to provide you with exceptional heart care, we have created designated Provider Care Teams.  These Care Teams include your primary Cardiologist (physician) and Advanced Practice Providers (APPs -  Physician Assistants and Nurse Practitioners) who all work together to provide you with the care you need, when you need it.  We recommend signing up for the patient portal called "MyChart".  Sign up information is provided on this After Visit Summary.  MyChart is used to connect with patients for Virtual Visits (Telemedicine).  Patients are able to view lab/test results, encounter notes, upcoming appointments, etc.  Non-urgent messages can be sent to your provider as well.   To learn more about what you can do with MyChart, go to [email or web address].    Your next appointment:   6 month(s)  The format for your next appointment:   In Person  Provider:   Berniece Salines, DO [address], [place], [place] [address]    Other Instructions

## 2020-10-14 ENCOUNTER — Ambulatory Visit (INDEPENDENT_AMBULATORY_CARE_PROVIDER_SITE_OTHER): Payer: 59 | Admitting: Obstetrics & Gynecology

## 2020-10-14 VITALS — BP 156/93 | HR 68 | Wt 301.0 lb

## 2020-10-14 DIAGNOSIS — N939 Abnormal uterine and vaginal bleeding, unspecified: Secondary | ICD-10-CM | POA: Diagnosis not present

## 2020-10-14 NOTE — Progress Notes (Signed)
History:  50 y.o.LMP here today for 4 week post op check.Pt is s/p hysteroscopy with endometrial ablation on 09/08/2020.  Pt reports that she is doing well. She is eating and passing stools without difficulty.   Pt reports only spotting until yesterday when she reports heavy bleeding. Pt reports that she may have used 6 pads throughout the day. Then, today the bleeding is very light.   The following portions of the patient's history were reviewed and updated as appropriate: allergies, current medications, past family history, past medical history, past social history, past surgical history and problem list.  Review of Systems:  Pertinent items are noted in HPI.    Objective:  Physical Exam BP (!) 156/93   Pulse 68   Wt (!) 301 lb (136.5 kg)   LMP 10/12/2020 (Exact Date) Comment: ABLATION 09/10/20  BMI 55.05 kg/m   CONSTITUTIONAL: Well-developed, well-nourished female in no acute distress.  HENT:  Normocephalic, atraumatic EYES: Conjunctivae and EOM are normal. No scleral icterus.  NECK: Normal range of motion SKIN: Skin is warm and dry. No rash noted. Not diaphoretic.No pallor. [place]: Alert and oriented to person, place, and time. Normal coordination.  Abd: Soft, nontender and nondistended; her port sites are healing well. .  Pelvic: deferred  Labs and Imaging Surg path 09/08/2020  FINAL MICROSCOPIC DIAGNOSIS:   A. ENDOMETRIUM, CURETTAGE:  - Fragments of endometrium.  No hyperplasia or malignancy.  - Fragments of smooth muscle suggestive of leiomyoma.  - Incidental minute fragments of benign endocervical mucosa.   Assessment & Plan:  4 week post op check following hysteroscopy with endometrial ablation    Doing well however, had some bleeding that we should follow   Reviewed her surg path.   Reviewed post op instructions and activities  F/u in 4 weeks or sooner prn  All questions answered.   Bailie Christenbury L. Harraway-Smith, M.D., Cherlynn June

## 2020-10-14 NOTE — Progress Notes (Signed)
Patient states she had period that started on Monday and bleeding was light. Tuesday bleeding was heavy and pain left side. Wednesday light day again with slight left side pain. Kathrene Alu RN

## 2020-10-18 ENCOUNTER — Other Ambulatory Visit: Payer: Self-pay | Admitting: Family

## 2020-10-18 ENCOUNTER — Encounter: Payer: Self-pay | Admitting: Obstetrics & Gynecology

## 2020-10-20 ENCOUNTER — Telehealth: Payer: Self-pay

## 2020-10-20 ENCOUNTER — Other Ambulatory Visit: Payer: Self-pay

## 2020-10-20 MED ORDER — METHOCARBAMOL 500 MG PO TABS: 500.0000 mg | ORAL_TABLET | Freq: Three times a day (TID) | ORAL | 0 refills | Status: DC | PRN

## 2020-10-20 MED ORDER — LEVOTHYROXINE SODIUM 100 MCG PO TABS: 100.0000 ug | ORAL_TABLET | Freq: Every day | ORAL | 0 refills | Status: DC

## 2020-10-20 MED ORDER — ALPRAZOLAM 0.25 MG PO TABS: ORAL_TABLET | ORAL | 0 refills | Status: DC

## 2020-10-20 MED ORDER — AMLODIPINE BESYLATE 5 MG PO TABS: 5.0000 mg | ORAL_TABLET | Freq: Every day | ORAL | 1 refills | Status: DC

## 2020-10-20 MED ORDER — MONTELUKAST SODIUM 10 MG PO TABS: 10.0000 mg | ORAL_TABLET | Freq: Every day | ORAL | 1 refills | Status: DC

## 2020-10-20 MED ORDER — PANTOPRAZOLE SODIUM 40 MG PO TBEC: 40.0000 mg | DELAYED_RELEASE_TABLET | Freq: Two times a day (BID) | ORAL | 1 refills | Status: DC

## 2020-10-20 NOTE — Telephone Encounter (Signed)
Pt called stating she does not understand why her med refill requests were denied per her pharmacy.  Please advise.  Pt has an upcoming appt on 10/30/20.

## 2020-10-30 ENCOUNTER — Encounter: Payer: Self-pay | Admitting: Family

## 2020-10-30 ENCOUNTER — Other Ambulatory Visit: Payer: Self-pay

## 2020-10-30 ENCOUNTER — Ambulatory Visit (INDEPENDENT_AMBULATORY_CARE_PROVIDER_SITE_OTHER): Payer: 59 | Admitting: Family

## 2020-10-30 VITALS — BP 127/57 | HR 75 | Temp 97.9°F | Resp 16 | Ht 62.0 in | Wt 299.2 lb

## 2020-10-30 DIAGNOSIS — Z0000 Encounter for general adult medical examination without abnormal findings: Secondary | ICD-10-CM | POA: Diagnosis not present

## 2020-10-30 DIAGNOSIS — Z1211 Encounter for screening for malignant neoplasm of colon: Secondary | ICD-10-CM

## 2020-10-30 NOTE — Patient Instructions (Signed)
Please schedule a routine dental visit.

## 2020-10-30 NOTE — Assessment & Plan Note (Signed)
Reports good diet.  Very inactive.   Discussed diet/exercise/weight loss. Pap up to date. Refer for mammo/colo.   Wt Readings from Last 3 Encounters:  10/30/20 299 lb 4 oz (135.7 kg)  10/14/20 (!) 301 lb (136.5 kg)  10/13/20 (!) 301 lb (136.5 kg)

## 2020-10-30 NOTE — Progress Notes (Signed)
Subjective:   By signing my name below, I, Lyric Barr-McArthur, attest that this documentation has been prepared under the direction and in the presence of [name], NP, 10/30/2020   Patient ID: Sharon Rivers, female    DOB: [birth year], 50 y.o.   MRN: [id number]  Chief Complaint  Patient presents with   Annual Exam    HPI Patient is in today for a comprehensive physical exam.  She denies having any unexpected weight change, ear pain, hearing loss and rhinorrhea, visual disturbance, cough, chest pain and leg swelling, nausea, vomiting, diarrhea and blood in stool, or dysuria and frequency, for myalgias and arthralgias, rash, headaches, adenopathy, or depression at this time. Hysteroscopy: She recently had a hysteroscopy to help with her heavy menstrual bleeding. She had one really heavy period post procedure but it was only one day in length. She will be following back up with her GYN provider within the next three months.  Anxiety: She is experiencing an increase in anxiety due to her current job coming to an end this week.  Immunizations: Tetanus is UTD. She is not interested in a flu vaccination or Covid-19 vaccination today. She is worried that her history of blood clots will cause complications with the [id number] vaccine.  Diet: She does not eat a lot of red meat or fried foods. She is also consuming a lot more water.  Exercise: She notes minimal physical activity and has been encouraged to increase it.  Colonoscopy: Never performed at this time. Recommended for her age.  Dexa: Last performed on 03/16/2017 and results were normal for her age. Repeat if needed but not necessary.  Pap Smear: Last performed on 09/02/2019 and results were normal. Repeat after 3 years. Mammogram: Last performed on 08/05/2019 and results were normal. Updated mammogram recommended after one year. Due.  Blood work: She has had recent blood work done on multiple occassions.  Dental: She is not UTD on  dental.  Vision: She is UTD on vision. Shx: No current changes to surgical history that is not present on the chart.  FMHx: No changes noted to her family medical history. Alcohol: Not using alcohol.  Drugs: Not using drugs.  Tobacco: Not using any tobacco products.    Health Maintenance Due  Topic Date Due   COLONOSCOPY (Pts 45-73yrs Insurance coverage will need to be confirmed)  Never done   MAMMOGRAM  08/04/2020    Past Medical History:  Diagnosis Date   Anxiety    Back pain    Chest pain    a. 07/2015 Myoview: EF 71%, medium size, mild intensity, partially reversible septal defect with overlying breast attenuation -> likely artifact. No significant reversible ischemia.   Constipation    Depression    Edema    feet and legs   Frequent headaches    GERD (gastroesophageal reflux disease)    History of DVT (deep vein thrombosis) 2018   Hypertension    Hypothyroidism    Insulin resistance    Joint pain    Migraines    OSA (obstructive sleep apnea) 03/17/2016   Osteoarthritis    Panic anxiety syndrome    PONV (postoperative nausea and vomiting)    Pulmonary embolism ([place]) 2022   SOB (shortness of breath)    a. 04/2016 Echo: EF 60-65%, Gr1 DD.   Swallowing difficulty     Past Surgical History:  Procedure Laterality Date   CESAREAN SECTION  2001 & 2002   DILATION AND CURETTAGE OF UTERUS N/A 09/08/2020  Procedure: DILATATION AND CURETTAGE;  Surgeon: Lavonia Drafts, MD;  Location: [facility];  Service: Gynecology;  Laterality: N/A;   ELBOW SURGERY     HYSTEROSCOPY WITH NOVASURE N/A 09/08/2020   Procedure: HYSTEROSCOPY WITH NOVASURE;  Surgeon: Lavonia Drafts, MD;  Location: [facility];  Service: Gynecology;  Laterality: N/A;   MINOR CARPAL TUNNEL     pinched nerve in elbow   WISDOM TOOTH EXTRACTION      Family History  Problem Relation Age of Onset   Hypertension Mother        Living   Arthritis Mother    Thyroid disease Mother    Heart disease Mother         MI at age 47   Heart attack Mother    Migraines Mother    Depression Mother    Obesity Mother    Congestive Heart Failure Father        ?CHF   Hypertension Maternal Grandmother    Parkinson's disease Maternal Grandmother    Alzheimer's disease Maternal Grandmother    Cancer Maternal Grandfather    Hypertension Maternal Grandfather    Hypertension Paternal Grandmother    Hypertension Paternal Grandfather    Gout Brother    ADD / ADHD Son        x1   Other Son        #2-Unknown   Diabetes Neg Hx     Social History   Socioeconomic History   Marital status: Single    Spouse name: Not on file   Number of children: 2   Years of education: Not on file   Highest education level: Associate degree: occupational, Hotel manager, or vocational program  Occupational History   Occupation: Loss adjuster, chartered    Comment: [employer]  Tobacco Use   Smoking status: Former    Types: Cigarettes    Quit date: 1992    Years since quitting: 30.7   Smokeless tobacco: Never   Tobacco comments:    quit 1992  Substance and Sexual Activity   Alcohol use: No    Alcohol/week: 0.0 standard drinks   Drug use: No   Sexual activity: Yes    Partners: Male    Birth control/protection: None  Other Topics Concern   Not on file  Social History Narrative   She has 2 children (grown). She did not retain custody of these children.   Lives with boyfriend in [place].   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 20 tablet  0   ALPRAZolam (XANAX) 0.25 MG tablet TAKE 1 TABLET BY MOUTH TWICE DAILY AS NEEDED FOR  ANXIETY  AND  FOR  SLEEP.  DO  NOT  TAKE  WITH  HYDROCODONE 30 tablet 0   amLODipine (NORVASC) 5 MG tablet Take 1 tablet (5 mg total) by mouth daily. 90 tablet 1   azelastine (ASTELIN) 0.1 % nasal spray Place 1 spray into both nostrils 2 (two) times daily. Use in each nostril as directed (  Patient taking differently: Place 1 spray into both nostrils 2 (two) times daily as needed for allergies. Use in each nostril as directed) 30 mL 5   docusate sodium (COLACE) 100 MG capsule Take 100 mg by mouth 2 (two) times daily as needed for mild constipation.     fluticasone (FLONASE) 50 MCG/ACT nasal spray Place 2 sprays into both nostrils daily. (Patient taking differently: Place 2 sprays into both nostrils daily as needed for allergies.) 16 g 6   levothyroxine (EUTHYROX) 100 MCG tablet Take 1 tablet (100 mcg total) by mouth daily. 90 tablet 0   methocarbamol (ROBAXIN) 500 MG tablet Take 1 tablet (500 mg total) by mouth every 8 (eight) hours as needed for muscle spasms. 20 tablet 0   montelukast (SINGULAIR) 10 MG tablet Take 1 tablet (10 mg total) by mouth at bedtime. 90 tablet 1   nystatin (MYCOSTATIN/NYSTOP) powder Apply 1 application topically 3 (three) times daily. (Patient taking differently: Apply 1 application topically 3 (three) times daily as needed (skin irritation (yeast)).) 60 g 1   ondansetron (ZOFRAN) 4 MG tablet Take 1 tablet (4 mg total) by mouth every 6 (six) hours as needed for nausea. 20 tablet 0   oxybutynin (DITROPAN-XL) 5 MG 24 hr tablet Take 1 tablet (5 mg total) by mouth at bedtime. (Patient taking differently: Take 5 mg by mouth in the morning.) 30 tablet 5   pantoprazole (PROTONIX) 40 MG tablet Take 1 tablet (40 mg total) by mouth 2 (two) times daily. 60 tablet 1   Polyvinyl Alcohol-Povidone PF (REFRESH) 1.4-0.6 % SOLN Place 1-2 drops into both eyes 3 (three) times daily as needed (dry/irritated eyes).      rivaroxaban (XARELTO) 20 MG TABS tablet Take 1 tablet (20 mg total) by mouth daily with supper. 30 tablet 5   rizatriptan (MAXALT) 10 MG tablet As needed for headache; may repeat in 2 hours if needed; max 2 per day or 8 per month 8 tablet 6   senna-docusate (SENOKOT-S) 8.6-50 MG tablet Take 1 tablet by mouth at bedtime as needed for mild constipation. 30 tablet 0   sertraline (ZOLOFT) 100 MG tablet Take 1 tablet (100 mg total) by mouth daily. (Patient taking differently: Take 100 mg by mouth in the morning.) 90 tablet 1   topiramate (TOPAMAX) 50 MG tablet Take 1 tablet (50 mg total) by mouth 2 (two) times daily. 60 tablet 12   traZODone (DESYREL) 100 MG tablet Take 1 tablet (100 mg total) by mouth at bedtime. 90 tablet 1   valsartan (DIOVAN) 320 MG tablet Take 1 tablet (320 mg total) by mouth daily. 90 tablet 3   No facility-administered medications prior to visit.    Allergies  Allergen Reactions   Megace [Megestrol] Other (See Comments)    Pt is s/p PE and DVT on low dose Megace   Camphor Other (See Comments)    Burning sensation.   Menthol Other (See Comments)    Burning sensation.   Other Other (See Comments)   Adhesive [Tape] Other (See Comments)    Skin Burn.   Biofreeze [Menthol (Topical Analgesic)] Other (See Comments)    Skin Burn.   Folic Acid Hives   Lisinopril Cough   Sulfa Antibiotics Rash    Review of Systems  Constitutional:        (-) unexpected weight changes  HENT:  Negative for ear pain and hearing loss.        (-) rhinorrhea  Eyes:        (-)  visual disturbances   Respiratory:  Negative for cough.   Cardiovascular:  Negative for chest pain and leg swelling.  Gastrointestinal:  Negative for blood in stool, constipation, diarrhea, nausea and vomiting.  Genitourinary:  Negative for dysuria and frequency.  Musculoskeletal:  Negative for joint pain and myalgias.  Skin:  Negative for rash.  Neurological:  Negative for headaches.  Psychiatric/Behavioral:   Negative for depression. The patient is nervous/anxious (mild and work related).       Objective:    Physical Exam Constitutional:      General: She is not in acute distress.    Appearance: Normal appearance. She is not ill-appearing.  HENT:     Head: Normocephalic and atraumatic.     Right Ear: Tympanic membrane, ear canal and external ear normal.     Left Ear: Tympanic membrane, ear canal and external ear normal.  Eyes:     Extraocular Movements: Extraocular movements intact.     Pupils: Pupils are equal, round, and reactive to light.     Comments: (-) nystagmus  Cardiovascular:     Rate and Rhythm: Normal rate and regular rhythm.     Heart sounds: Normal heart sounds. No murmur heard.   No gallop.  Pulmonary:     Effort: Pulmonary effort is normal. No respiratory distress.     Breath sounds: Normal breath sounds. No wheezing or rales.  Abdominal:     General: Bowel sounds are normal.     Palpations: Abdomen is soft.     Tenderness: There is no abdominal tenderness.  Musculoskeletal:     Comments: (+) 5/5 upper and lower extremity strength  Lymphadenopathy:     Cervical: No cervical adenopathy.  Skin:    General: Skin is warm and dry.  Neurological:     Mental Status: She is alert and oriented to person, place, and time.     Deep Tendon Reflexes:     Reflex Scores:      Patellar reflexes are 3+ on the right side and 2+ on the left side. Psychiatric:        Behavior: Behavior normal.        Judgment: Judgment normal.    BP (!) 127/57 (BP Location: Left Arm, Patient Position: Sitting, Cuff Size: Large)   Pulse 75   Temp 97.9 F (36.6 C) (Oral)   Resp 16   Ht 5\' 2"  (1.575 m)   Wt 299 lb 4 oz (135.7 kg)   LMP 10/12/2020 (Exact Date) Comment: ABLATION 09/10/20  SpO2 99%   BMI 54.73 kg/m  Wt Readings from Last 3 Encounters:  10/30/20 299 lb 4 oz (135.7 kg)  10/14/20 (!) 301 lb (136.5 kg)  10/13/20 (!) 301 lb (136.5 kg)       Assessment & Plan:   Problem  List Items Addressed This Visit       Unprioritized   Preventative health care    Reports good diet.  Very inactive.   Discussed diet/exercise/weight loss. Pap up to date. Refer for mammo/colo.   Wt Readings from Last 3 Encounters:  10/30/20 299 lb 4 oz (135.7 kg)  10/14/20 (!) 301 lb (136.5 kg)  10/13/20 (!) 301 lb (136.5 kg)        Relevant Orders   MM 3D SCREEN BREAST BILATERAL   Other Visit Diagnoses     Colon cancer screening    -  Primary   Relevant Orders   Ambulatory referral to Gastroenterology      No orders of  the defined types were placed in this encounter.   I, [name], NP, personally preformed the services described in this documentation.  All medical record entries made by the scribe were at my direction and in my presence.  I have reviewed the chart and discharge instructions (if applicable) and agree that the record reflects my personal performance and is accurate and complete. 10/30/2020  I,Lyric Barr-McArthur,acting as a scribe for [name], NP.,have documented all relevant documentation on the behalf of [name], NP,as directed by  [name], NP while in the presence of [name], NP.  [name], NP

## 2020-11-12 ENCOUNTER — Telehealth (HOSPITAL_BASED_OUTPATIENT_CLINIC_OR_DEPARTMENT_OTHER): Payer: Self-pay

## 2020-11-15 ENCOUNTER — Other Ambulatory Visit: Payer: Self-pay | Admitting: Family

## 2020-11-16 NOTE — Telephone Encounter (Signed)
Last RX:10-20-20 Last OV:10-30-20 Next OV:05-03-21 UDS:04-21-20 CSC:04-21-20

## 2020-11-24 ENCOUNTER — Other Ambulatory Visit: Payer: Self-pay | Admitting: Family

## 2020-11-29 ENCOUNTER — Encounter: Payer: Self-pay | Admitting: Family

## 2020-11-30 MED ORDER — CEPHALEXIN 500 MG PO CAPS: 500.0000 mg | ORAL_CAPSULE | Freq: Three times a day (TID) | ORAL | 0 refills | Status: DC

## 2020-11-30 NOTE — Telephone Encounter (Signed)
Patient advised of new prescription and scheduled to come in Friday.

## 2020-11-30 NOTE — Telephone Encounter (Signed)
I sent an rx for keflex for her to start. Recommend warm compresses bid and a follow up appointment with me on Friday. Call if symptoms worsen or if not improving.

## 2020-12-04 ENCOUNTER — Other Ambulatory Visit: Payer: Self-pay

## 2020-12-04 ENCOUNTER — Ambulatory Visit (INDEPENDENT_AMBULATORY_CARE_PROVIDER_SITE_OTHER): Payer: 59 | Admitting: Family

## 2020-12-04 VITALS — BP 148/77 | HR 78 | Temp 98.6°F | Resp 16 | Wt 307.0 lb

## 2020-12-04 DIAGNOSIS — G4700 Insomnia, unspecified: Secondary | ICD-10-CM

## 2020-12-04 DIAGNOSIS — L02211 Cutaneous abscess of abdominal wall: Secondary | ICD-10-CM | POA: Diagnosis not present

## 2020-12-04 DIAGNOSIS — F419 Anxiety disorder, unspecified: Secondary | ICD-10-CM | POA: Diagnosis not present

## 2020-12-04 MED ORDER — SERTRALINE HCL 100 MG PO TABS: 150.0000 mg | ORAL_TABLET | Freq: Every day | ORAL | 1 refills | Status: DC

## 2020-12-04 MED ORDER — HYDROXYZINE PAMOATE 25 MG PO CAPS: 25.0000 mg | ORAL_CAPSULE | Freq: Every evening | ORAL | 3 refills | Status: DC | PRN

## 2020-12-04 NOTE — Progress Notes (Signed)
Subjective:   By signing my name below, I, Sharon Rivers, attest that this documentation has been prepared under the direction and in the presence of Sharon Alar, NP, 12/04/2020   Patient ID: Sharon Rivers, female    DOB: 07-27-1970, 50 y.o.   MRN: [id number]  Chief Complaint  Patient presents with   Abscess    Follow up on abscess on abdomen. Doing much better on antibiotic.    Anxiety    Patient complains of increased anxiety. Trouble falling sleep.     HPI Patient is in today for an office visit.   Abscess on stomach: She had a previous abscess on her stomach near her belly button that with the use of antibiotics has improved. Anxiety: She notes that she is experiencing high levels of anxiety recently. She is taking 100 mg Zoloft to help with her anxiety but does not report much improvement. She notes that her anxiety is worse at night when she is trying to sleep so it makes it difficulty to get to sleep. She has been using 100 mg trazodone to help with her sleep but it does not seem to help much.  She also has 0.25 mg xanax to take as needed for anxiety. Her GAD7 score was 21 today in the office.   Health Maintenance Due  Topic Date Due   COVID-19 Vaccine (1) Never done   Pneumococcal Vaccine 38-82 Years old (1 - PCV) Never done   COLONOSCOPY (Pts 45-38yrs Insurance coverage will need to be confirmed)  Never done   MAMMOGRAM  08/04/2020    Past Medical History:  Diagnosis Date   Anxiety    Back pain    Chest pain    a. 07/2015 Myoview: EF 71%, medium size, mild intensity, partially reversible septal defect with overlying breast attenuation -> likely artifact. No significant reversible ischemia.   Constipation    Depression    Edema    feet and legs   Frequent headaches    GERD (gastroesophageal reflux disease)    History of DVT (deep vein thrombosis) 2018   Hypertension    Hypothyroidism    Insulin resistance    Joint pain    Migraines    OSA  (obstructive sleep apnea) 03/17/2016   Osteoarthritis    Panic anxiety syndrome    PONV (postoperative nausea and vomiting)    Pulmonary embolism ([place]) 2022   SOB (shortness of breath)    a. 04/2016 Echo: EF 60-65%, Gr1 DD.   Swallowing difficulty     Past Surgical History:  Procedure Laterality Date   CESAREAN SECTION  2001 & 2002   DILATION AND CURETTAGE OF UTERUS N/A 09/08/2020   Procedure: DILATATION AND CURETTAGE;  Surgeon: Sharon Drafts, MD;  Location: [facility];  Service: Gynecology;  Laterality: N/A;   ELBOW SURGERY     HYSTEROSCOPY WITH NOVASURE N/A 09/08/2020   Procedure: HYSTEROSCOPY WITH NOVASURE;  Surgeon: Sharon Drafts, MD;  Location: [facility];  Service: Gynecology;  Laterality: N/A;   MINOR CARPAL TUNNEL     pinched nerve in elbow   WISDOM TOOTH EXTRACTION      Family History  Problem Relation Age of Onset   Hypertension Mother        Living   Arthritis Mother    Thyroid disease Mother    Heart disease Mother        MI at age 36   Heart attack Mother    Migraines Mother    Depression Mother  Obesity Mother    Congestive Heart Failure Father        ?CHF   Hypertension Maternal Grandmother    Parkinson's disease Maternal Grandmother    Alzheimer's disease Maternal Grandmother    Cancer Maternal Grandfather    Hypertension Maternal Grandfather    Hypertension Paternal Grandmother    Hypertension Paternal Grandfather    Gout Brother    ADD / ADHD Son        x1   Other Son        #2-Unknown   Diabetes Neg Hx     Social History   Socioeconomic History   Marital status: Single    Spouse name: Not on file   Number of children: 2   Years of education: Not on file   Highest education level: Associate degree: occupational, Hotel manager, or vocational program  Occupational History   Occupation: Loss adjuster, chartered    Comment: [employer]  Tobacco Use   Smoking status: Former    Types: Cigarettes    Quit date: 1992    Years since quitting: 30.8    Smokeless tobacco: Never   Tobacco comments:    quit 1992  Substance and Sexual Activity   Alcohol use: No    Alcohol/week: 0.0 standard drinks   Drug use: No   Sexual activity: Yes    Partners: Male    Birth control/protection: None  Other Topics Concern   Not on file  Social History Narrative   She has 2 children (grown). She did not retain custody of these children.   Lives with boyfriend in [place].   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 20 tablet 0   ALPRAZolam (XANAX) 0.25 MG tablet TAKE 1 TABLET BY MOUTH TWICE DAILY AS NEEDED FOR ANXIETY AND FOR SLEEP. DO NOT TAKE WITH HYDROCODONE. 30 tablet 0   amLODipine (NORVASC) 5 MG tablet Take 1 tablet (5 mg total) by mouth daily. 90 tablet 1   azelastine (ASTELIN) 0.1 % nasal spray Place 1 spray into both nostrils 2 (two) times daily. Use in each nostril as directed (Patient taking differently: Place 1 spray into both nostrils 2 (two) times daily as needed for allergies. Use in each nostril as directed) 30 mL 5   cephALEXin (KEFLEX) 500 MG capsule Take 1 capsule (500 mg total) by mouth 3 (three) times daily. 21 capsule 0   docusate sodium (COLACE) 100 MG capsule Take 100 mg by mouth 2 (two) times daily as needed for mild constipation.     fluticasone (FLONASE) 50 MCG/ACT nasal spray Place 2 sprays into both nostrils daily. (Patient taking differently: Place 2 sprays into both nostrils daily as needed for allergies.) 16 g 6   levothyroxine  (EUTHYROX) 100 MCG tablet Take 1 tablet (100 mcg total) by mouth daily. 90 tablet 0   methocarbamol (ROBAXIN) 500 MG tablet Take 1 tablet (500 mg total) by mouth every 8 (eight) hours as needed for muscle spasms. 20 tablet 0  montelukast (SINGULAIR) 10 MG tablet Take 1 tablet (10 mg total) by mouth at bedtime. 90 tablet 1   nystatin (MYCOSTATIN/NYSTOP) powder Apply 1 application topically 3 (three) times daily. (Patient taking differently: Apply 1 application topically 3 (three) times daily as needed (skin irritation (yeast)).) 60 g 1   ondansetron (ZOFRAN) 4 MG tablet Take 1 tablet (4 mg total) by mouth every 6 (six) hours as needed for nausea. 20 tablet 0   oxybutynin (DITROPAN-XL) 5 MG 24 hr tablet Take 1 tablet (5 mg total) by mouth at bedtime. (Patient taking differently: Take 5 mg by mouth in the morning.) 30 tablet 5   pantoprazole (PROTONIX) 40 MG tablet Take 1 tablet (40 mg total) by mouth 2 (two) times daily. 60 tablet 1   Polyvinyl Alcohol-Povidone PF (REFRESH) 1.4-0.6 % SOLN Place 1-2 drops into both eyes 3 (three) times daily as needed (dry/irritated eyes).     rivaroxaban (XARELTO) 20 MG TABS tablet Take 1 tablet (20 mg total) by mouth daily with supper. 30 tablet 5   rizatriptan (MAXALT) 10 MG tablet As needed for headache; may repeat in 2 hours if needed; max 2 per day or 8 per month 8 tablet 6   senna-docusate (SENOKOT-S) 8.6-50 MG tablet Take 1 tablet by mouth at bedtime as needed for mild constipation. 30 tablet 0   topiramate (TOPAMAX) 50 MG tablet Take 1 tablet (50 mg total) by mouth 2 (two) times daily. 60 tablet 12   valsartan (DIOVAN) 320 MG tablet Take 1 tablet (320 mg total) by mouth daily. 90 tablet 3   sertraline (ZOLOFT) 100 MG tablet Take 1 tablet (100 mg total) by mouth daily. (Patient taking differently: Take 100 mg by mouth in the morning.) 90 tablet 1   traZODone (DESYREL) 100 MG tablet TAKE 1 TABLET BY MOUTH AT BEDTIME 90 tablet 1   No facility-administered  medications prior to visit.    Allergies  Allergen Reactions   Megace [Megestrol] Other (See Comments)    Pt is s/p PE and DVT on low dose Megace   Camphor Other (See Comments)    Burning sensation.   Menthol Other (See Comments)    Burning sensation.   Other Other (See Comments)   Adhesive [Tape] Other (See Comments)    Skin Burn.   Biofreeze [Menthol (Topical Analgesic)] Other (See Comments)    Skin Burn.   Folic Acid Hives   Lisinopril Cough   Sulfa Antibiotics Rash    Review of Systems  Genitourinary:  Hematuria: High levels of anxiety.  Skin:        (+) improving abscess on stomach  Psychiatric/Behavioral:  The patient is nervous/anxious.       Objective:    Physical Exam Constitutional:      General: She is not in acute distress.    Appearance: Normal appearance. She is not ill-appearing.  HENT:     Head: Normocephalic and atraumatic.     Right Ear: External ear normal.     Left Ear: External ear normal.  Eyes:     Extraocular Movements: Extraocular movements intact.     Pupils: Pupils are equal, round, and reactive to light.  Cardiovascular:     Rate and Rhythm: Normal rate and regular rhythm.     Heart sounds: Normal heart sounds. No murmur heard.   No gallop.  Pulmonary:     Effort: Pulmonary effort is normal. No respiratory distress.     Breath sounds: Normal breath sounds. No wheezing or rales.  Lymphadenopathy:     Cervical: No cervical adenopathy.  Skin:    General: Skin is warm and dry.     Findings: Abscess (Small, raised abscess above the umbilicus about 1 cm wide) present.  Neurological:     Mental Status: She is alert and oriented to person, place, and time.  Psychiatric:        Behavior: Behavior normal.        Judgment: Judgment normal.    BP (!) 148/77 (BP Location: Right Arm, Patient Position: Sitting, Cuff Size: Large)   Pulse 78   Temp 98.6 F (37 C) (Oral)   Resp 16   Wt (!) 307 lb (139.3 kg)   SpO2 100%   BMI 56.15 kg/m   Wt Readings from Last 3 Encounters:  12/04/20 (!) 307 lb (139.3 kg)  10/30/20 299 lb 4 oz (135.7 kg)  10/14/20 (!) 301 lb (136.5 kg)       Assessment & Plan:   Problem List Items Addressed This Visit       Unprioritized   Insomnia    D/C Trazodone.  Start trial of prn hydroxyzine.       Cutaneous abscess of abdominal wall - Primary    Improving with keflex 500mg . Pt advised to complete course and call if it does not continue to improve.       Anxiety    Uncontrolled. Will increase zoloft from 100mg  to 150mg  once daily.       Relevant Medications   sertraline (ZOLOFT) 100 MG tablet   hydrOXYzine (VISTARIL) 25 MG capsule   Meds ordered this encounter  Medications   sertraline (ZOLOFT) 100 MG tablet    Sig: Take 1.5 tablets (150 mg total) by mouth daily.    Dispense:  135 tablet    Refill:  1    Order Specific Question:   Supervising Provider    Answer:   Penni Homans A [[id number]]   hydrOXYzine (VISTARIL) 25 MG capsule    Sig: Take 1 capsule (25 mg total) by mouth at bedtime as needed (for sleep).    Dispense:  30 capsule    Refill:  3    Order Specific Question:   Supervising Provider    Answer:   Penni Homans A [[id number]]    I, Sharon Alar, NP, personally preformed the services described in this documentation.  All medical record entries made by the scribe were at my direction and in my presence.  I have reviewed the chart and discharge instructions (if applicable) and agree that the record reflects my personal performance and is accurate and complete. 12/04/2020  I,Sharon Rivers,acting as a Education administrator for [name], NP.,have documented all relevant documentation on the behalf of [name], NP,as directed by  [name], NP while in the presence of [name], NP.  [name], NP

## 2020-12-04 NOTE — Patient Instructions (Signed)
Please increase sertraline (zoloft) to 1.5 tablets daily. Stop trazodone, instead you may use hydroxyzine 25mg  at bedime as needed for sleep.  Continue antibiotics.  Call if abscess does not continue to improve.

## 2020-12-04 NOTE — Assessment & Plan Note (Signed)
D/C Trazodone.  Start trial of prn hydroxyzine.

## 2020-12-04 NOTE — Assessment & Plan Note (Signed)
Uncontrolled. Will increase zoloft from 100mg  to 150mg  once daily.

## 2020-12-04 NOTE — Assessment & Plan Note (Signed)
Improving with keflex 500mg . Pt advised to complete course and call if it does not continue to improve.

## 2020-12-06 ENCOUNTER — Encounter: Payer: Self-pay | Admitting: Family

## 2020-12-20 ENCOUNTER — Other Ambulatory Visit: Payer: Self-pay | Admitting: Family

## 2020-12-28 ENCOUNTER — Encounter: Payer: Self-pay | Admitting: Family

## 2020-12-28 ENCOUNTER — Other Ambulatory Visit: Payer: Self-pay

## 2020-12-28 ENCOUNTER — Inpatient Hospital Stay: Payer: 59 | Attending: Hematology & Oncology

## 2020-12-28 ENCOUNTER — Inpatient Hospital Stay (HOSPITAL_BASED_OUTPATIENT_CLINIC_OR_DEPARTMENT_OTHER): Payer: 59 | Admitting: Family

## 2020-12-28 VITALS — BP 132/90 | HR 81 | Temp 98.5°F | Resp 20 | Ht 62.0 in | Wt 293.1 lb

## 2020-12-28 DIAGNOSIS — I82401 Acute embolism and thrombosis of unspecified deep veins of right lower extremity: Secondary | ICD-10-CM | POA: Insufficient documentation

## 2020-12-28 DIAGNOSIS — I2699 Other pulmonary embolism without acute cor pulmonale: Secondary | ICD-10-CM | POA: Diagnosis not present

## 2020-12-28 DIAGNOSIS — Z7901 Long term (current) use of anticoagulants: Secondary | ICD-10-CM | POA: Diagnosis not present

## 2020-12-28 DIAGNOSIS — D6862 Lupus anticoagulant syndrome: Secondary | ICD-10-CM | POA: Insufficient documentation

## 2020-12-28 DIAGNOSIS — I824Y9 Acute embolism and thrombosis of unspecified deep veins of unspecified proximal lower extremity: Secondary | ICD-10-CM

## 2020-12-28 DIAGNOSIS — E7211 Homocystinuria: Secondary | ICD-10-CM

## 2020-12-28 DIAGNOSIS — R760 Raised antibody titer: Secondary | ICD-10-CM

## 2020-12-28 LAB — CBC WITH DIFFERENTIAL (CANCER CENTER ONLY)
Abs Immature Granulocytes: 0.02 10*3/uL (ref 0.00–0.07)
Basophils Absolute: 0 10*3/uL (ref 0.0–0.1)
Basophils Relative: 1 %
Eosinophils Absolute: 0.4 10*3/uL (ref 0.0–0.5)
Eosinophils Relative: 5 %
HCT: 39.3 % (ref 36.0–46.0)
Hemoglobin: 13 g/dL (ref 12.0–15.0)
Immature Granulocytes: 0 %
Lymphocytes Relative: 20 %
Lymphs Abs: 1.3 10*3/uL (ref 0.7–4.0)
MCH: 28.1 pg (ref 26.0–34.0)
MCHC: 33.1 g/dL (ref 30.0–36.0)
MCV: 85.1 fL (ref 80.0–100.0)
Monocytes Absolute: 0.4 10*3/uL (ref 0.1–1.0)
Monocytes Relative: 7 %
Neutro Abs: 4.4 10*3/uL (ref 1.7–7.7)
Neutrophils Relative %: 67 %
Platelet Count: 268 10*3/uL (ref 150–400)
RBC: 4.62 MIL/uL (ref 3.87–5.11)
RDW: 13.7 % (ref 11.5–15.5)
WBC Count: 6.5 10*3/uL (ref 4.0–10.5)
nRBC: 0 % (ref 0.0–0.2)

## 2020-12-28 LAB — CMP (CANCER CENTER ONLY)
ALT: 14 U/L (ref 0–44)
AST: 13 U/L — ABNORMAL LOW (ref 15–41)
Albumin: 4.1 g/dL (ref 3.5–5.0)
Alkaline Phosphatase: 57 U/L (ref 38–126)
Anion gap: 6 (ref 5–15)
BUN: 13 mg/dL (ref 6–20)
CO2: 24 mmol/L (ref 22–32)
Calcium: 9.5 mg/dL (ref 8.9–10.3)
Chloride: 107 mmol/L (ref 98–111)
Creatinine: 0.94 mg/dL (ref 0.44–1.00)
GFR, Estimated: >60 mL/min (ref >60)
Glucose, Bld: 122 mg/dL — ABNORMAL HIGH (ref 70–99)
Potassium: 3.9 mmol/L (ref 3.5–5.1)
Sodium: 137 mmol/L (ref 135–145)
Total Bilirubin: 0.5 mg/dL (ref 0.3–1.2)
Total Protein: 7.1 g/dL (ref 6.5–8.1)

## 2020-12-28 NOTE — Progress Notes (Signed)
Hematology and Oncology Follow Up Visit  Sharon Rivers [id number] Oct 22, 1970 50 y.o. 12/28/2020   Principle Diagnosis:  DVT right lower extremity and bilateral pulmonary emboli with right heart strain  Lupus anticoagulant positive Hyperhomocystinemia     Current Therapy:        Xarelto 20 mg PO daily - life long   Interim History:  Sharon Rivers is here today for follow-up. She is feeling fatigue today. She states that she loves her CPAP machine and has been wearing it regularly. She took a break the last two nights due to a canker sore on her lip.  She states that her first cycle after having the uterine ablation was heavy but this last month was very light. No other blood loss noted.  No abnormal bruising, no petechiae.  No swelling or tenderness in her extremities.  She has intermittent positional numbness and tingling in her hands due to carpal tunnel.  She also notes numbness and tingling in the left lower extremity which she states is secondary to a pinched nerve in her lower back.  No fever, chills, n/v, cough, rash, dizziness, palpitations, abdominal pain or changes in bowel or bladder habits.  She has mild SOB with over exertion at times. She takes a break to rest when needed.  She has occasional musculoskeletal pain in the right chest with certain movements of the right arm.  No falls or syncope to report.  She has maintained a good appetite and is staying well hydrated. Her weight is stable at 293 lbs.   ECOG Performance Status: 1 - Symptomatic but completely ambulatory  Medications:  Allergies as of 12/28/2020       Reactions   Megace [megestrol] Other (See Comments)   Pt is s/p PE and DVT on low dose Megace   Adhesive [tape] Other (See Comments)   Skin Burn.   Biofreeze [menthol (topical Analgesic)] Other (See Comments)   Skin Burn.   Camphor Other (See Comments)   Burning sensation.   Folic Acid Hives   Lisinopril Cough   Menthol Other (See Comments)   Burning  sensation.   Sulfa Antibiotics Rash        Medication List        Accurate as of December 28, 2020  2:56 PM. If you have any questions, ask your nurse or doctor.          STOP taking these medications    cephALEXin 500 MG capsule Commonly known as: KEFLEX Stopped by: Lottie Dawson, NP       TAKE these medications    acetaminophen 325 MG tablet Commonly known as: TYLENOL Take 2 tablets (650 mg total) by mouth every 6 (six) hours as needed for mild pain (or Fever >/= 101).   ALPRAZolam 0.25 MG tablet Commonly known as: XANAX TAKE 1 TABLET BY MOUTH TWICE DAILY AS NEEDED FOR ANXIETY AND FOR SLEEP. DO NOT TAKE WITH HYDROCODONE.   amLODipine 5 MG tablet Commonly known as: NORVASC Take 1 tablet (5 mg total) by mouth daily.   azelastine 0.1 % nasal spray Commonly known as: ASTELIN Place 1 spray into both nostrils 2 (two) times daily. Use in each nostril as directed What changed:  when to take this reasons to take this   docusate sodium 100 MG capsule Commonly known as: COLACE Take 100 mg by mouth 2 (two) times daily as needed for mild constipation.   fluticasone 50 MCG/ACT nasal spray Commonly known as: FLONASE Place 2 sprays into both nostrils daily.  What changed:  when to take this reasons to take this   hydrOXYzine 25 MG capsule Commonly known as: Vistaril Take 1 capsule (25 mg total) by mouth at bedtime as needed (for sleep).   levothyroxine 100 MCG tablet Commonly known as: Euthyrox Take 1 tablet (100 mcg total) by mouth daily.   methocarbamol 500 MG tablet Commonly known as: Robaxin Take 1 tablet (500 mg total) by mouth every 8 (eight) hours as needed for muscle spasms.   montelukast 10 MG tablet Commonly known as: SINGULAIR Take 1 tablet (10 mg total) by mouth at bedtime.   nystatin powder Commonly known as: MYCOSTATIN/NYSTOP Apply 1 application topically 3 (three) times daily. What changed:  when to take this reasons to take this    ondansetron 4 MG tablet Commonly known as: ZOFRAN Take 1 tablet (4 mg total) by mouth every 6 (six) hours as needed for nausea.   oxybutynin 5 MG 24 hr tablet Commonly known as: DITROPAN-XL Take 1 tablet (5 mg total) by mouth at bedtime. What changed: when to take this   pantoprazole 40 MG tablet Commonly known as: PROTONIX Take 1 tablet (40 mg total) by mouth 2 (two) times daily.   Refresh 1.4-0.6 % Soln Generic drug: Polyvinyl Alcohol-Povidone PF Place 1-2 drops into both eyes 3 (three) times daily as needed (dry/irritated eyes).   rivaroxaban 20 MG Tabs tablet Commonly known as: XARELTO Take 1 tablet (20 mg total) by mouth daily with supper.   rizatriptan 10 MG tablet Commonly known as: MAXALT As needed for headache; may repeat in 2 hours if needed; max 2 per day or 8 per month   senna-docusate 8.6-50 MG tablet Commonly known as: Senokot-S Take 1 tablet by mouth at bedtime as needed for mild constipation.   sertraline 100 MG tablet Commonly known as: ZOLOFT Take 1.5 tablets (150 mg total) by mouth daily.   topiramate 50 MG tablet Commonly known as: TOPAMAX Take 1 tablet (50 mg total) by mouth 2 (two) times daily.   valsartan 320 MG tablet Commonly known as: DIOVAN Take 1 tablet (320 mg total) by mouth daily.        Allergies:  Allergies  Allergen Reactions   Megace [Megestrol] Other (See Comments)    Pt is s/p PE and DVT on low dose Megace   Adhesive [Tape] Other (See Comments)    Skin Burn.   Biofreeze [Menthol (Topical Analgesic)] Other (See Comments)    Skin Burn.   Camphor Other (See Comments)    Burning sensation.   Folic Acid Hives   Lisinopril Cough   Menthol Other (See Comments)    Burning sensation.   Sulfa Antibiotics Rash    Past Medical History, Surgical history, Social history, and Family History were reviewed and updated.  Review of Systems: All other 10 point review of systems is negative.   Physical Exam:  height is 5\' 2"   (1.575 m) and weight is 293 lb 1.9 oz (133 kg). Her oral temperature is 98.5 F (36.9 C). Her blood pressure is 132/90 and her pulse is 81. Her respiration is 20 and oxygen saturation is 100%.   Wt Readings from Last 3 Encounters:  12/28/20 293 lb 1.9 oz (133 kg)  12/04/20 (!) 307 lb (139.3 kg)  10/30/20 299 lb 4 oz (135.7 kg)    Ocular: Sclerae unicteric, pupils equal, round and reactive to light Ear-nose-throat: Oropharynx clear, dentition fair Lymphatic: No cervical or supraclavicular adenopathy Lungs no rales or rhonchi, good excursion bilaterally Heart  regular rate and rhythm, no murmur appreciated Abd soft, nontender, positive bowel sounds MSK no focal spinal tenderness, no joint edema Neuro: non-focal, well-oriented, appropriate affect Breasts: Deferred   Lab Results  Component Value Date   WBC 6.5 12/28/2020   HGB 13.0 12/28/2020   HCT 39.3 12/28/2020   MCV 85.1 12/28/2020   PLT 268 12/28/2020   No results found for: FERRITIN, IRON, TIBC, UIBC, IRONPCTSAT Lab Results  Component Value Date   RBC 4.62 12/28/2020   No results found for: KPAFRELGTCHN, LAMBDASER, KAPLAMBRATIO No results found for: IGGSERUM, IGA, IGMSERUM No results found for: Odetta Pink, SPEI   Chemistry      Component Value Date/Time   NA 139 08/27/2020 1305   NA 137 07/20/2020 1633   K 3.7 08/27/2020 1305   CL 105 08/27/2020 1305   CO2 28 08/27/2020 1305   BUN 14 08/27/2020 1305   BUN 12 07/20/2020 1633   CREATININE 0.97 08/27/2020 1305   CREATININE 0.86 08/14/2020 1617      Component Value Date/Time   CALCIUM 9.5 08/27/2020 1305   ALKPHOS 64 08/27/2020 1305   AST 13 (L) 08/27/2020 1305   ALT 15 08/27/2020 1305   BILITOT 0.5 08/27/2020 1305       Impression and Plan: Sharon Rivers is a very pleasant 50 yo caucasian female with recurrent right lower extremity DVT and now bilateral PE's. She had an elevated homocystine level and positive  lupus anticoagulant.  She is doing well on long term anticoagulation with Xarelto.  Follow-up in another 6 months.  She can contact our office with any questions or concerns.    Lottie Dawson, NP 11/21/20222:56 PM

## 2020-12-29 ENCOUNTER — Other Ambulatory Visit: Payer: Self-pay | Admitting: Family

## 2020-12-29 ENCOUNTER — Telehealth: Payer: Self-pay | Admitting: Family

## 2020-12-29 ENCOUNTER — Other Ambulatory Visit: Payer: Self-pay | Admitting: Obstetrics and Gynecology

## 2020-12-29 ENCOUNTER — Ambulatory Visit (INDEPENDENT_AMBULATORY_CARE_PROVIDER_SITE_OTHER): Payer: 59 | Admitting: Family

## 2020-12-29 VITALS — BP 146/91 | HR 84 | Temp 97.9°F | Resp 16 | Wt 293.0 lb

## 2020-12-29 DIAGNOSIS — N3281 Overactive bladder: Secondary | ICD-10-CM

## 2020-12-29 DIAGNOSIS — F419 Anxiety disorder, unspecified: Secondary | ICD-10-CM

## 2020-12-29 DIAGNOSIS — G4700 Insomnia, unspecified: Secondary | ICD-10-CM | POA: Diagnosis not present

## 2020-12-29 DIAGNOSIS — B001 Herpesviral vesicular dermatitis: Secondary | ICD-10-CM | POA: Diagnosis not present

## 2020-12-29 LAB — LUPUS ANTICOAGULANT PANEL
DRVVT: 44 s (ref 0.0–47.0)
PTT Lupus Anticoagulant: 30.4 s (ref 0.0–51.9)

## 2020-12-29 LAB — HOMOCYSTEINE: Homocysteine: 18.3 umol/L — ABNORMAL HIGH (ref 0.0–14.5)

## 2020-12-29 MED ORDER — VALACYCLOVIR HCL 1 G PO TABS: ORAL_TABLET | ORAL | 0 refills | Status: DC

## 2020-12-29 NOTE — Assessment & Plan Note (Signed)
New. Will rx with valtrex.

## 2020-12-29 NOTE — Progress Notes (Signed)
Subjective:   By signing my name below, I, Lyric Barr-McArthur, attest that this documentation has been prepared under the direction and in the presence of [name], NP, 12/29/2020     Patient ID: Sharon Rivers, female    DOB: [birth year], 50 y.o.   MRN: [id number]  Chief Complaint  Patient presents with   Anxiety    Here for follow up, "doing better on new medication dose"    HPI Patient is in today for an office visit.  Anxiety: She describes her anxiety as "alright". She is now moving into a more permanent term of employment which she thinks will help. During her last visit she was complaining of problems sleeping. Due to this she was advised to stop taking 100 mg trazodone and begin taking 25 mg hydroxyzine. Her 100 mg Zoloft was also increased to 150 mg Zoloft and she notes no new weight gain or decrease in her mood or anxiety progress.  Sore on upper lip: She complains of a painful sore on her upper lip during this visit.  Blood pressure: Her blood pressure was elevated in the office today. It was rechecked and recorded at 135/88. BP Readings from Last 3 Encounters:  12/29/20 (!) 146/91  12/28/20 132/90  12/04/20 (!) 148/77   Health Maintenance Due  Topic Date Due   COVID-19 Vaccine (1) Never done   Pneumococcal Vaccine 75-60 Years old (1 - PCV) Never done   COLONOSCOPY (Pts 45-73yrs Insurance coverage will need to be confirmed)  Never done   MAMMOGRAM  08/04/2020    Past Medical History:  Diagnosis Date   Anxiety    Back pain    Chest pain    a. 07/2015 Myoview: EF 71%, medium size, mild intensity, partially reversible septal defect with overlying breast attenuation -> likely artifact. No significant reversible ischemia.   Constipation    Depression    Edema    feet and legs   Frequent headaches    GERD (gastroesophageal reflux disease)    History of DVT (deep vein thrombosis) 2018   Hypertension    Hypothyroidism    Insulin resistance    Joint pain     Migraines    OSA (obstructive sleep apnea) 03/17/2016   Osteoarthritis    Panic anxiety syndrome    PONV (postoperative nausea and vomiting)    Pulmonary embolism ([place]) 2022   SOB (shortness of breath)    a. 04/2016 Echo: EF 60-65%, Gr1 DD.   Swallowing difficulty     Past Surgical History:  Procedure Laterality Date   CESAREAN SECTION  2001 & 2002   DILATION AND CURETTAGE OF UTERUS N/A 09/08/2020   Procedure: DILATATION AND CURETTAGE;  Surgeon: Lavonia Drafts, MD;  Location: [facility];  Service: Gynecology;  Laterality: N/A;   ELBOW SURGERY     HYSTEROSCOPY WITH NOVASURE N/A 09/08/2020   Procedure: HYSTEROSCOPY WITH NOVASURE;  Surgeon: Lavonia Drafts, MD;  Location: [facility];  Service: Gynecology;  Laterality: N/A;   MINOR CARPAL TUNNEL     pinched nerve in elbow   WISDOM TOOTH EXTRACTION      Family History  Problem Relation Age of Onset   Hypertension Mother        Living   Arthritis Mother    Thyroid disease Mother    Heart disease Mother        MI at age 49   Heart attack Mother    Migraines Mother    Depression Mother    Obesity Mother  Congestive Heart Failure Father        ?CHF   Hypertension Maternal Grandmother    Parkinson's disease Maternal Grandmother    Alzheimer's disease Maternal Grandmother    Cancer Maternal Grandfather    Hypertension Maternal Grandfather    Hypertension Paternal Grandmother    Hypertension Paternal Grandfather    Gout Brother    ADD / ADHD Son        x1   Other Son        #2-Unknown   Diabetes Neg Hx     Social History   Socioeconomic History   Marital status: Single    Spouse name: Not on file   Number of children: 2   Years of education: Not on file   Highest education level: Associate degree: occupational, Hotel manager, or vocational program  Occupational History   Occupation: Loss adjuster, chartered    Comment: [employer]  Tobacco Use   Smoking status: Former    Types: Cigarettes    Quit date: 1992    Years since  quitting: 30.9   Smokeless tobacco: Never   Tobacco comments:    quit 1992  Vaping Use   Vaping Use: Never used  Substance and Sexual Activity   Alcohol use: No    Alcohol/week: 0.0 standard drinks   Drug use: No   Sexual activity: Yes    Partners: Male    Birth control/protection: None  Other Topics Concern   Not on file  Social History Narrative   She has 2 children (grown). She did not retain custody of these children.   Lives with boyfriend in [place].   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 20 tablet 0   ALPRAZolam (XANAX) 0.25 MG tablet TAKE 1 TABLET BY MOUTH TWICE DAILY AS NEEDED FOR ANXIETY AND FOR SLEEP. DO NOT TAKE WITH HYDROCODONE. 30 tablet 0   amLODipine (NORVASC) 5 MG tablet Take 1 tablet (5 mg total) by mouth daily. 90 tablet 1   azelastine (ASTELIN) 0.1 % nasal spray Place 1 spray into both nostrils 2 (two) times daily. Use in each nostril as directed (Patient taking differently: Place 1 spray into both nostrils 2 (two) times daily as needed for allergies. Use in each nostril as directed) 30 mL 5   docusate sodium (COLACE) 100 MG capsule Take 100 mg by mouth 2 (two) times daily as needed for mild constipation.     fluticasone (FLONASE) 50 MCG/ACT nasal spray Place 2 sprays into both nostrils daily. (Patient taking differently: Place 2 sprays into both nostrils daily as needed for allergies.) 16 g 6   hydrOXYzine (VISTARIL) 25 MG capsule Take 1 capsule (25 mg total) by  mouth at bedtime as needed (for sleep). 30 capsule 3   levothyroxine (EUTHYROX) 100 MCG tablet Take 1 tablet (100 mcg total) by mouth daily. 90 tablet 0   methocarbamol (ROBAXIN) 500 MG tablet Take 1 tablet (500 mg total) by mouth every 8 (eight) hours as needed  for muscle spasms. 20 tablet 0   montelukast (SINGULAIR) 10 MG tablet Take 1 tablet (10 mg total) by mouth at bedtime. 90 tablet 1   nystatin (MYCOSTATIN/NYSTOP) powder Apply 1 application topically 3 (three) times daily. (Patient taking differently: Apply 1 application topically 3 (three) times daily as needed (skin irritation (yeast)).) 60 g 1   ondansetron (ZOFRAN) 4 MG tablet Take 1 tablet (4 mg total) by mouth every 6 (six) hours as needed for nausea. 20 tablet 0   oxybutynin (DITROPAN-XL) 5 MG 24 hr tablet Take 1 tablet (5 mg total) by mouth at bedtime. (Patient taking differently: Take 5 mg by mouth in the morning.) 30 tablet 5   pantoprazole (PROTONIX) 40 MG tablet Take 1 tablet (40 mg total) by mouth 2 (two) times daily. 60 tablet 1   Polyvinyl Alcohol-Povidone PF (REFRESH) 1.4-0.6 % SOLN Place 1-2 drops into both eyes 3 (three) times daily as needed (dry/irritated eyes).     rivaroxaban (XARELTO) 20 MG TABS tablet Take 1 tablet (20 mg total) by mouth daily with supper. 30 tablet 5   rizatriptan (MAXALT) 10 MG tablet As needed for headache; may repeat in 2 hours if needed; max 2 per day or 8 per month 8 tablet 6   senna-docusate (SENOKOT-S) 8.6-50 MG tablet Take 1 tablet by mouth at bedtime as needed for mild constipation. 30 tablet 0   sertraline (ZOLOFT) 100 MG tablet Take 1.5 tablets (150 mg total) by mouth daily. 135 tablet 1   topiramate (TOPAMAX) 50 MG tablet Take 1 tablet (50 mg total) by mouth 2 (two) times daily. 60 tablet 12   valsartan (DIOVAN) 320 MG tablet Take 1 tablet (320 mg total) by mouth daily. 90 tablet 3   No facility-administered medications prior to visit.    Allergies  Allergen Reactions   Megace  [Megestrol] Other (See Comments)    Pt is s/p PE and DVT on low dose Megace   Adhesive [Tape] Other (See Comments)    Skin Burn.   Biofreeze [Menthol (Topical Analgesic)] Other (See Comments)    Skin Burn.   Camphor Other (See Comments)    Burning sensation.   Folic Acid Hives   Lisinopril Cough   Menthol Other (See Comments)    Burning sensation.   Sulfa Antibiotics Rash    Review of Systems  Skin:        (+) sore on upper lip  Psychiatric/Behavioral:  Negative for depression. The patient is nervous/anxious (improved with medication adjustments). The patient does not have insomnia (improved with medication adjustments).       Objective:    Physical Exam Constitutional:      General: She is not in acute distress.    Appearance: Normal appearance. She is not ill-appearing.  HENT:     Head: Normocephalic and atraumatic.     Right Ear: External ear normal.     Left Ear: External ear normal.  Eyes:     Extraocular Movements: Extraocular movements intact.     Pupils: Pupils are equal, round, and reactive to light.  Skin:    General: Skin is warm and dry.     Findings: Lesion (scabbed lesion on upper lip) present.  Neurological:     Mental Status: She is alert and oriented to person, place, and time.  Psychiatric:        Behavior: Behavior normal.        Judgment: Judgment normal.    BP (!) 146/91 (BP Location: Right Arm, Patient  Position: Sitting, Cuff Size: Large)   Pulse 84   Temp 97.9 F (36.6 C) (Oral)   Resp 16   Wt 293 lb (132.9 kg)   SpO2 98%   BMI 53.59 kg/m  Wt Readings from Last 3 Encounters:  12/29/20 293 lb (132.9 kg)  12/28/20 293 lb 1.9 oz (133 kg)  12/04/20 (!) 307 lb (139.3 kg)       Assessment & Plan:   Problem List Items Addressed This Visit       Unprioritized   Insomnia    Improved with hydroxyzine.       Cold sore - Primary    New. Will rx with valtrex.        Relevant Medications   valACYclovir (VALTREX) 1000 MG tablet    Anxiety    Wt Readings from Last 3 Encounters:  12/29/20 293 lb (132.9 kg)  12/28/20 293 lb 1.9 oz (133 kg)  12/04/20 (!) 307 lb (139.3 kg)  Anxiety is improved on increased dose of zoloft. Continue at 150mg  once daily. She has lost some weight and I commended her on this.       Meds ordered this encounter  Medications   valACYclovir (VALTREX) 1000 MG tablet    Sig: Take 2 tabs by mouth now and again in 12 hours for cold sore    Dispense:  4 tablet    Refill:  0    Order Specific Question:   Supervising Provider    Answer:   Penni Homans A [[id number]]     I, [name], NP, personally preformed the services described in this documentation.  All medical record entries made by the scribe were at my direction and in my presence.  I have reviewed the chart and discharge instructions (if applicable) and agree that the record reflects my personal performance and is accurate and complete. 12/29/2020  I,Lyric Barr-McArthur,acting as a Education administrator for [name], NP.,have documented all relevant documentation on the behalf of [name], NP,as directed by  [name], NP while in the presence of [name], NP.  [name], NP

## 2020-12-29 NOTE — Assessment & Plan Note (Signed)
Improved with hydroxyzine

## 2020-12-29 NOTE — Telephone Encounter (Signed)
Scheduled appt per 11/21 los - patient is aware of appt date and time scheduled in 6 months.

## 2020-12-29 NOTE — Assessment & Plan Note (Addendum)
Wt Readings from Last 3 Encounters:  12/29/20 293 lb (132.9 kg)  12/28/20 293 lb 1.9 oz (133 kg)  12/04/20 (!) 307 lb (139.3 kg)   Anxiety is improved on increased dose of zoloft. Continue at 150mg  once daily. She has lost some weight and I commended her on this.

## 2021-01-03 ENCOUNTER — Other Ambulatory Visit: Payer: Self-pay | Admitting: Family

## 2021-01-04 ENCOUNTER — Encounter: Payer: Self-pay | Admitting: Cardiology

## 2021-01-04 ENCOUNTER — Other Ambulatory Visit: Payer: Self-pay | Admitting: Obstetrics and Gynecology

## 2021-01-04 DIAGNOSIS — N3281 Overactive bladder: Secondary | ICD-10-CM

## 2021-01-05 ENCOUNTER — Other Ambulatory Visit: Payer: Self-pay

## 2021-01-05 MED ORDER — HYDROCHLOROTHIAZIDE 12.5 MG PO CAPS: 12.5000 mg | ORAL_CAPSULE | Freq: Every day | ORAL | 3 refills | Status: DC

## 2021-01-05 NOTE — Progress Notes (Signed)
Prescription sent to pharmacy.

## 2021-01-05 NOTE — Progress Notes (Signed)
Oxybutynin called in to the [facility] for a 30 day supply. Pt MUST f/u on 12/23 for more refills per Dr. Wannetta Sender

## 2021-01-13 ENCOUNTER — Other Ambulatory Visit: Payer: Self-pay

## 2021-01-13 ENCOUNTER — Ambulatory Visit (INDEPENDENT_AMBULATORY_CARE_PROVIDER_SITE_OTHER): Payer: 59 | Admitting: Obstetrics & Gynecology

## 2021-01-13 ENCOUNTER — Telehealth: Payer: Self-pay | Admitting: General Practice

## 2021-01-13 VITALS — BP 131/91 | HR 85 | Ht 62.0 in | Wt 290.0 lb

## 2021-01-13 DIAGNOSIS — N939 Abnormal uterine and vaginal bleeding, unspecified: Secondary | ICD-10-CM | POA: Diagnosis not present

## 2021-01-13 DIAGNOSIS — R030 Elevated blood-pressure reading, without diagnosis of hypertension: Secondary | ICD-10-CM | POA: Diagnosis not present

## 2021-01-13 NOTE — Progress Notes (Signed)
History:  50 y.o. [id number] here today for f/u of AUB. Pt reports that her last 2 cycles have been very light. She almost missed that she was bleeding. She dnies pain or other assoc sx. She reports that she is very happy post ablation.    The following portions of the patient's history were reviewed and updated as appropriate: allergies, current medications, past family history, past medical history, past social history, past surgical history and problem list.  Review of Systems:  Pertinent items are noted in HPI.    Objective:  Physical Exam Blood pressure (!) 131/91, pulse 85, height 5\' 2"  (1.575 m), weight 290 lb (131.5 kg).  CONSTITUTIONAL: Well-developed, well-nourished female in no acute distress.  HENT:  Normocephalic, atraumatic EYES: Conjunctivae and EOM are normal. No scleral icterus.  NECK: Normal range of motion SKIN: Skin is warm and dry. No rash noted. Not diaphoretic.No pallor. [place]: Alert and oriented to person, place, and time. Normal coordination.  Pelvic: deferred   Assessment & Plan:  AUB- sx now resolved.  Rec f/u in 1 year or sooner prn   Elevated BP - pt has f/u appt with cardiology  Rec keep appt with Dr. Wannetta Sender f/u female incontinence as prev scheduled.   Beaux Verne L. Harraway-Smith, M.D., Cherlynn June

## 2021-01-13 NOTE — Telephone Encounter (Signed)
Patient aware of insurance [facility]) will not be in network with [place] effective 02/07/2021 and verbalized understanding.

## 2021-01-14 ENCOUNTER — Ambulatory Visit (INDEPENDENT_AMBULATORY_CARE_PROVIDER_SITE_OTHER): Payer: 59 | Admitting: Family Medicine

## 2021-01-14 ENCOUNTER — Encounter: Payer: Self-pay | Admitting: Family Medicine

## 2021-01-14 VITALS — BP 114/80 | HR 78 | Ht 62.0 in | Wt 291.0 lb

## 2021-01-14 DIAGNOSIS — G43009 Migraine without aura, not intractable, without status migrainosus: Secondary | ICD-10-CM

## 2021-01-14 MED ORDER — RIZATRIPTAN BENZOATE 10 MG PO TABS: ORAL_TABLET | ORAL | 6 refills | Status: DC

## 2021-01-14 MED ORDER — TOPIRAMATE 50 MG PO TABS: 50.0000 mg | ORAL_TABLET | Freq: Two times a day (BID) | ORAL | 3 refills | Status: DC

## 2021-01-14 NOTE — Patient Instructions (Signed)
Below is our plan:  We will continue topiramate 50mg  twice daily. Use rizatriptan as needed. Continue Tylenol as needed for abortive therapy for tension headaches but do not take regularly. Continue CPAP.   Please make sure you are staying well hydrated. I recommend 50-60 ounces daily. Well balanced diet and regular exercise encouraged. Consistent sleep schedule with 6-8 hours recommended.   Please continue follow up with care team as directed.   Follow up with me in 1 year   You may receive a survey regarding today's visit. I encourage you to leave honest feed back as I do use this information to improve patient care. Thank you for seeing me today!

## 2021-01-14 NOTE — Progress Notes (Signed)
Chief Complaint  Patient presents with   Follow-up    Rm 1, alone. Here for 6 month migraine f/u. Pt reports migraine have been better. Vision has improved. Tompirate has helped and tolerating. Start CPAP treatments in Aug. Pt reports doing well. Has helped her sleep through the night. No longer having morning HA.      HISTORY OF PRESENT ILLNESS:  01/14/21 ALL:  Sharon Rivers is a 50 y.o. female here today for follow up for migraines. She was advised to stop carbamazepine at consult with Dr Leta Baptist 07/2020 and start topiramate 50mg  BID. Rizatriptan rx'd for abortive therapy. CT head ordered but has not been completed. Since, migraines have resolved. She was started on CPAP with pulmonology. She has 4-5 tensions a month relieved by Tylenol. She has not used rizatriptan.    HISTORY (copied from Dr Gladstone Lighter previous note)  50 year old female here for evaluation of headaches.   Patient has history of headaches since childhood with generalized global throbbing headaches, nausea, photophobia symptoms.  Symptoms continued throughout her life.  Had worsening symptoms in 2018 and saw neurologist for evaluation.   Symptoms continue to fluctuate and have worsened in the last 4 to 6 months.  Now headaches affecting left side with nausea, sensitive to light and sound and smell.  She has tried writing medications including sumatriptan and, Topamax, venlafaxine, amlodipine, allergy medicines, ibuprofen, Tylenol, and more recently carbamazepine without relief.   Also with history of lupus anticoagulant, DVTs and PE.  On Coumadin.   REVIEW OF SYSTEMS: Out of a complete 14 system review of symptoms, the patient complains only of the following symptoms, headaches and all other reviewed systems are negative.   ALLERGIES: Allergies  Allergen Reactions   Megace [Megestrol] Other (See Comments)    Pt is s/p PE and DVT on low dose Megace   Adhesive [Tape] Other (See Comments)    Skin Burn.    Biofreeze [Menthol (Topical Analgesic)] Other (See Comments)    Skin Burn.   Camphor Other (See Comments)    Burning sensation.   Folic Acid Hives   Lisinopril Cough   Menthol Other (See Comments)    Burning sensation.   Sulfa Antibiotics Rash     HOME MEDICATIONS: Outpatient Medications Prior to Visit  Medication Sig Dispense Refill   acetaminophen (TYLENOL) 325 MG tablet Take 2 tablets (650 mg total) by mouth every 6 (six) hours as needed for mild pain (or Fever >/= 101). 20 tablet 0   ALPRAZolam (XANAX) 0.25 MG tablet TAKE 1 TABLET BY MOUTH TWICE DAILY AS NEEDED FOR ANXIETY AND FOR SLEEP. DO NOT TAKE WITH HYDROCODONE. 30 tablet 0   amLODipine (NORVASC) 5 MG tablet Take 1 tablet (5 mg total) by mouth daily. 90 tablet 1   azelastine (ASTELIN) 0.1 % nasal spray Place 1 spray into both nostrils 2 (two) times daily. Use in each nostril as directed (Patient taking differently: Place 1 spray into both nostrils 2 (two) times daily as needed for allergies. Use in each nostril as directed) 30 mL 5   docusate sodium (COLACE) 100 MG capsule Take 100 mg by mouth 2 (two) times daily as needed for mild constipation.     fluticasone (FLONASE) 50 MCG/ACT nasal spray Place 2 sprays into both nostrils daily. (Patient taking differently: Place 2 sprays into both nostrils daily as needed for allergies.) 16 g 6   hydrochlorothiazide (MICROZIDE) 12.5 MG capsule Take 1 capsule (12.5 mg total) by mouth daily. 90 capsule 3  hydrOXYzine (VISTARIL) 25 MG capsule Take 1 capsule (25 mg total) by mouth at bedtime as needed (for sleep). 30 capsule 3   levothyroxine (EUTHYROX) 100 MCG tablet Take 1 tablet (100 mcg total) by mouth daily. 90 tablet 0   methocarbamol (ROBAXIN) 500 MG tablet Take 1 tablet (500 mg total) by mouth every 8 (eight) hours as needed for muscle spasms. 20 tablet 0   montelukast (SINGULAIR) 10 MG tablet Take 1 tablet (10 mg total) by mouth at bedtime. 90 tablet 1   nystatin (MYCOSTATIN/NYSTOP)  powder Apply 1 application topically 3 (three) times daily. (Patient taking differently: Apply 1 application topically 3 (three) times daily as needed (skin irritation (yeast)).) 60 g 1   ondansetron (ZOFRAN) 4 MG tablet Take 1 tablet (4 mg total) by mouth every 6 (six) hours as needed for nausea. 20 tablet 0   oxybutynin (DITROPAN-XL) 5 MG 24 hr tablet Take 1 tablet (5 mg total) by mouth at bedtime. (Patient taking differently: Take 5 mg by mouth in the morning.) 30 tablet 5   pantoprazole (PROTONIX) 40 MG tablet Take 1 tablet by mouth twice daily 180 tablet 1   Polyvinyl Alcohol-Povidone PF (REFRESH) 1.4-0.6 % SOLN Place 1-2 drops into both eyes 3 (three) times daily as needed (dry/irritated eyes).     rivaroxaban (XARELTO) 20 MG TABS tablet Take 1 tablet (20 mg total) by mouth daily with supper. 30 tablet 5   senna-docusate (SENOKOT-S) 8.6-50 MG tablet Take 1 tablet by mouth at bedtime as needed for mild constipation. 30 tablet 0   sertraline (ZOLOFT) 100 MG tablet Take 1.5 tablets (150 mg total) by mouth daily. 135 tablet 1   valACYclovir (VALTREX) 1000 MG tablet Take 2 tabs by mouth now and again in 12 hours for cold sore 4 tablet 0   valsartan (DIOVAN) 320 MG tablet Take 1 tablet (320 mg total) by mouth daily. 90 tablet 3   rizatriptan (MAXALT) 10 MG tablet As needed for headache; may repeat in 2 hours if needed; max 2 per day or 8 per month 8 tablet 6   topiramate (TOPAMAX) 50 MG tablet Take 1 tablet (50 mg total) by mouth 2 (two) times daily. 60 tablet 12   No facility-administered medications prior to visit.     PAST MEDICAL HISTORY: Past Medical History:  Diagnosis Date   Anxiety    Back pain    Chest pain    a. 07/2015 Myoview: EF 71%, medium size, mild intensity, partially reversible septal defect with overlying breast attenuation -> likely artifact. No significant reversible ischemia.   Constipation    Depression    Edema    feet and legs   Frequent headaches    GERD  (gastroesophageal reflux disease)    History of DVT (deep vein thrombosis) 2018   Hypertension    Hypothyroidism    Insulin resistance    Joint pain    Migraines    OSA (obstructive sleep apnea) 03/17/2016   Osteoarthritis    Panic anxiety syndrome    PONV (postoperative nausea and vomiting)    Pulmonary embolism ([place]) 2022   SOB (shortness of breath)    a. 04/2016 Echo: EF 60-65%, Gr1 DD.   Swallowing difficulty      PAST SURGICAL HISTORY: Past Surgical History:  Procedure Laterality Date   CESAREAN SECTION  2001 & 2002   DILATION AND CURETTAGE OF UTERUS N/A 09/08/2020   Procedure: DILATATION AND CURETTAGE;  Surgeon: Lavonia Drafts, MD;  Location: [facility];  Service: Gynecology;  Laterality: N/A;   ELBOW SURGERY     HYSTEROSCOPY WITH NOVASURE N/A 09/08/2020   Procedure: HYSTEROSCOPY WITH NOVASURE;  Surgeon: Lavonia Drafts, MD;  Location: [facility];  Service: Gynecology;  Laterality: N/A;   MINOR CARPAL TUNNEL     pinched nerve in elbow   WISDOM TOOTH EXTRACTION       FAMILY HISTORY: Family History  Problem Relation Age of Onset   Hypertension Mother        Living   Arthritis Mother    Thyroid disease Mother    Heart disease Mother        MI at age 56   Heart attack Mother    Migraines Mother    Depression Mother    Obesity Mother    Congestive Heart Failure Father        ?CHF   Hypertension Maternal Grandmother    Parkinson's disease Maternal Grandmother    Alzheimer's disease Maternal Grandmother    Cancer Maternal Grandfather    Hypertension Maternal Grandfather    Hypertension Paternal Grandmother    Hypertension Paternal Grandfather    Gout Brother    ADD / ADHD Son        x1   Other Son        #2-Unknown   Diabetes Neg Hx      SOCIAL HISTORY: Social History   Socioeconomic History   Marital status: Single    Spouse name: Not on file   Number of children: 2   Years of education: Not on file   Highest education level: Associate  degree: occupational, Hotel manager, or vocational program  Occupational History   Occupation: Loss adjuster, chartered    Comment: [employer]  Tobacco Use   Smoking status: Former    Types: Cigarettes    Quit date: 1992    Years since quitting: 30.9   Smokeless tobacco: Never   Tobacco comments:    quit 1992  Vaping Use   Vaping Use: Never used  Substance and Sexual Activity   Alcohol use: No    Alcohol/week: 0.0 standard drinks   Drug use: No   Sexual activity: Yes    Partners: Male    Birth control/protection: None  Other Topics Concern   Not on file  Social History Narrative   She has 2 children (grown). She did not retain custody of these children.   Lives with boyfriend in [place].   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  Generalized: Well developed, in no acute distress  Cardiology: normal rate and rhythm, no murmur auscultated  Respiratory: clear to auscultation bilaterally    Neurological examination  Mentation: Alert oriented to time, place, history taking. Follows all commands speech and language fluent Cranial nerve II-XII: Pupils were equal round reactive to light. Extraocular movements were full, visual field were full on confrontational test. Facial sensation and strength were normal. Uvula tongue midline. Head turning and shoulder shrug  were normal and symmetric.  Motor: The motor testing reveals 5 over 5 strength of all 4 extremities. Good symmetric motor tone is noted throughout.  Sensory: Sensory testing is intact to  soft touch on all 4 extremities. No evidence of extinction is noted.  Coordination: Cerebellar testing reveals good finger-nose-finger and heel-to-shin bilaterally.  Gait and station: Gait is normal. Tandem gait is normal. Romberg is negative. No drift is seen.  Reflexes: Deep tendon reflexes are symmetric and normal bilaterally.    DIAGNOSTIC DATA (LABS, IMAGING, TESTING) - I reviewed patient records, labs, notes, testing and imaging myself where available.  Lab Results  Component Value Date   WBC 6.5 12/28/2020   HGB 13.0 12/28/2020   HCT 39.3 12/28/2020   MCV 85.1 12/28/2020   PLT 268 12/28/2020      Component Value Date/Time   NA 137 12/28/2020 1413   NA 137 07/20/2020 1633   K 3.9 12/28/2020 1413   CL 107 12/28/2020 1413   CO2 24 12/28/2020 1413   GLUCOSE 122 (H) 12/28/2020 1413   BUN 13 12/28/2020 1413   BUN 12 07/20/2020 1633   CREATININE 0.94 12/28/2020 1413   CREATININE 0.86 08/14/2020 1617   CALCIUM 9.5 12/28/2020 1413   PROT 7.1 12/28/2020 1413   PROT 6.5 03/08/2017 1108   ALBUMIN 4.1 12/28/2020 1413   ALBUMIN 3.7 03/08/2017 1108   AST 13 (L) 12/28/2020 1413   ALT 14 12/28/2020 1413   ALKPHOS 57 12/28/2020 1413   BILITOT 0.5 12/28/2020 1413   GFRNONAA >60 12/28/2020 1413   GFRAA >60 09/06/2019 1641   Lab Results  Component Value Date   CHOL 143 09/02/2019   HDL 40.60 09/02/2019   LDLCALC 86 09/02/2019   TRIG 81.0 09/02/2019   CHOLHDL 4 09/02/2019   Lab Results  Component Value Date   HGBA1C 4.8 05/16/2020   Lab Results  Component Value Date   MWNUUVOZ36 644 03/10/2016   Lab Results  Component Value Date   TSH 3.52 07/17/2020    No flowsheet data found.   No flowsheet data found.   ASSESSMENT AND PLAN  50 y.o. year old female  has a past medical history of Anxiety, Back pain, Chest pain, Constipation, Depression, Edema, Frequent headaches, GERD (gastroesophageal reflux disease), History of DVT (deep vein thrombosis) (2018), Hypertension,  Hypothyroidism, Insulin resistance, Joint pain, Migraines, OSA (obstructive sleep apnea) (03/17/2016), Osteoarthritis, Panic anxiety syndrome, PONV (postoperative nausea and vomiting), Pulmonary embolism ([place]) (2022), SOB (shortness of breath), and Swallowing difficulty. here with    Migraine without aura and without status migrainosus, not intractable  Jersie is doing well. Headaches are much less frequent/severe and she denies migrainous headaches since starting topiramate. We will continue 50mg  BID. Rizatritpan for abortive therapy. She will continue Tylenol for tension headaches but advised against regular use. She will continue CPAP per pulmonology. Healthy lifestyle habits encouraged. She wil follow up in 1 year.    No orders of the defined types were placed in this encounter.    Meds ordered this encounter  Medications   topiramate (TOPAMAX) 50 MG tablet    Sig: Take 1 tablet (50 mg total) by mouth 2 (two) times daily.    Dispense:  180 tablet    Refill:  3    Order Specific Question:   Supervising Provider    Answer:   Melvenia Beam [[id number]]   rizatriptan (MAXALT) 10 MG tablet    Sig: As needed for headache; may repeat in 2 hours if needed; max 2 per day or  8 per month    Dispense:  8 tablet    Refill:  6    Order Specific Question:   Supervising Provider    Answer:   Melvenia Beam [[id number]]       Debbora Presto, MSN, FNP-C 01/14/2021, 4:05 PM  [facility] [address], [place], [place] [address] [phone]

## 2021-01-20 ENCOUNTER — Other Ambulatory Visit: Payer: Self-pay | Admitting: Family

## 2021-01-28 NOTE — Progress Notes (Deleted)
Sharon Rivers [facility] Return Visit  SUBJECTIVE  History of Present Illness: Sharon Rivers is a 50 y.o. female seen in follow-up for overactive bladder and stress incontinence. Plan at last visit was start vesicare- this was denied coverage by her insurance, as well as Detrol. She was therefore started on Oxybutynin 5mg  and has been taking it about 4 weeks.  Feels like she is going to the bathroom a normal amount and no longer has that urgency and sudden need to rush to the bathroom. Still has some leakage with coughing and sneezing. She has dry mouth mostly in the morning but not as bothersome to her. Has also noticed headaches but has also noticed this prior to starting.   Still wearing pads due to stress incontinence. Has some vulvar irritation from the pads.  She was also referred to pelvic floor PT and has an appointment scheduled for February.    Past Medical History: Patient  has a past medical history of Anxiety, Back pain, Chest pain, Constipation, Depression, Edema, Frequent headaches, GERD (gastroesophageal reflux disease), History of DVT (deep vein thrombosis) (2018), Hypertension, Hypothyroidism, Insulin resistance, Joint pain, Migraines, OSA (obstructive sleep apnea) (03/17/2016), Osteoarthritis, Panic anxiety syndrome, PONV (postoperative nausea and vomiting), Pulmonary embolism ([place]) (2022), SOB (shortness of breath), and Swallowing difficulty.   Past Surgical History: She  has a past surgical history that includes Cesarean section (2001 & 2002); Wisdom tooth extraction; Minor carpal tunnel; Elbow surgery; Hysteroscopy with novasure (N/A, 09/08/2020); and Dilation and curettage of uterus (N/A, 09/08/2020).   Medications: She has a current medication list which includes the following prescription(s): acetaminophen, alprazolam, amlodipine, azelastine, docusate sodium, fluticasone, hydrochlorothiazide, hydroxyzine, levothyroxine, methocarbamol, montelukast, nystatin, ondansetron,  oxybutynin, pantoprazole, refresh, rivaroxaban, rizatriptan, senna-docusate, sertraline, topiramate, valacyclovir, and valsartan.   Allergies: Patient is allergic to megace [megestrol], adhesive [tape], biofreeze [menthol (topical analgesic)], camphor, folic acid, lisinopril, menthol, and sulfa antibiotics.   Social History: Patient  reports that she quit smoking about 30 years ago. Her smoking use included cigarettes. She has never used smokeless tobacco. She reports that she does not drink alcohol and does not use drugs.      OBJECTIVE     Physical Exam: There were no vitals filed for this visit.  Gen: No apparent distress, A&O x 3.  Detailed Urogynecologic Evaluation:  Deferred. Prior exam showed:  Pelvic Exam: Normal external female genitalia; Bartholin's and Skene's glands normal in appearance; urethral meatus normal in appearance, no urethral masses or discharge.    CST: negative   Speculum exam reveals normal vaginal mucosa without atrophy. Cervix normal appearance. Uterus normal single, nontender. Adnexa no mass, fullness, tenderness.       POP-Q 01/06/20:    POP-Q   -2                                            Aa   -2                                           Ba   -6  C    3                                            Gh   4.5                                            Pb   9                                            tvl    -3                                            Ap   -3                                            Bp   -9                                              D         ASSESSMENT AND PLAN    Sharon Rivers is a 50 y.o. with:  No diagnosis found.  1. OAB - She is doing well with the 5mg  oxybutynin so will continue on this dosage.  - If dry mouth becomes bothersome, she can try Biotene mouthwash or we can try switching to another medication.   2. SUI - Has pelvic PT scheduled in Feb  3.  Vulvar irritation - recommended vulvar moisturizer- coconut oil, vitamin E or others.  - Can also try switching to another brand of pads  Follow up 4 months  Sharon Folds, MD   Time spent: I spent 20 minutes dedicated to the care of this patient on the date of this encounter to include pre-visit review of records, face-to-face time with the patient and post visit documentation.

## 2021-01-29 ENCOUNTER — Ambulatory Visit: Payer: 59 | Admitting: Obstetrics and Gynecology

## 2021-02-01 ENCOUNTER — Other Ambulatory Visit: Payer: Self-pay | Admitting: Family

## 2021-02-01 ENCOUNTER — Encounter: Payer: Self-pay | Admitting: Obstetrics and Gynecology

## 2021-02-02 MED ORDER — ALPRAZOLAM 0.25 MG PO TABS: ORAL_TABLET | ORAL | 0 refills | Status: DC

## 2021-02-02 NOTE — Telephone Encounter (Signed)
Requesting: alprazolam Contract: 04/21/20 UDS: 04/21/20 Last Visit: 12/29/20 Next Visit: 05/03/21 Last Refill: 12/21/20  Please Advise

## 2021-02-02 NOTE — Telephone Encounter (Signed)
Pt was called and notified she needs an appt for more refills because its been 1 year since her last visit. Pt was rescheduled for Jan 2023

## 2021-02-12 ENCOUNTER — Ambulatory Visit: Payer: Self-pay

## 2021-03-09 ENCOUNTER — Ambulatory Visit: Payer: 59 | Admitting: Obstetrics and Gynecology

## 2021-03-10 NOTE — Progress Notes (Signed)
No notes needed

## 2021-03-12 ENCOUNTER — Ambulatory Visit: Payer: Self-pay | Admitting: Obstetrics and Gynecology

## 2021-03-13 ENCOUNTER — Other Ambulatory Visit: Payer: Self-pay | Admitting: Family

## 2021-03-15 ENCOUNTER — Other Ambulatory Visit: Payer: Self-pay | Admitting: Family

## 2021-03-15 MED ORDER — RIVAROXABAN 20 MG PO TABS: 20.0000 mg | ORAL_TABLET | Freq: Every day | ORAL | 0 refills | Status: DC

## 2021-03-15 NOTE — Telephone Encounter (Signed)
Due for follow up in May

## 2021-04-08 ENCOUNTER — Other Ambulatory Visit: Payer: Self-pay | Admitting: Family

## 2021-04-09 NOTE — Telephone Encounter (Signed)
Requesting: alprazolam 0.25mg   ?Contract:07/22/2019 ?UDS: 04/21/2020 ?Last Visit: 12/29/2020 ?Next Visit:05/03/2021 ?Last Refill: 02/02/2021 #30 and 0RF ? ?Please Advise ? ?

## 2021-04-15 ENCOUNTER — Ambulatory Visit: Payer: Self-pay | Admitting: Cardiology

## 2021-04-23 ENCOUNTER — Ambulatory Visit: Payer: 59 | Admitting: Family

## 2021-04-25 ENCOUNTER — Other Ambulatory Visit: Payer: Self-pay | Admitting: Family

## 2021-04-30 ENCOUNTER — Ambulatory Visit (INDEPENDENT_AMBULATORY_CARE_PROVIDER_SITE_OTHER): Payer: 59 | Admitting: Family

## 2021-04-30 VITALS — BP 125/73 | HR 74 | Temp 98.4°F | Resp 16 | Wt 298.0 lb

## 2021-04-30 DIAGNOSIS — B001 Herpesviral vesicular dermatitis: Secondary | ICD-10-CM

## 2021-04-30 DIAGNOSIS — E039 Hypothyroidism, unspecified: Secondary | ICD-10-CM | POA: Diagnosis not present

## 2021-04-30 DIAGNOSIS — F419 Anxiety disorder, unspecified: Secondary | ICD-10-CM

## 2021-04-30 DIAGNOSIS — Z1211 Encounter for screening for malignant neoplasm of colon: Secondary | ICD-10-CM | POA: Diagnosis not present

## 2021-04-30 DIAGNOSIS — E8881 Metabolic syndrome: Secondary | ICD-10-CM

## 2021-04-30 DIAGNOSIS — Z86711 Personal history of pulmonary embolism: Secondary | ICD-10-CM

## 2021-04-30 DIAGNOSIS — G4733 Obstructive sleep apnea (adult) (pediatric): Secondary | ICD-10-CM

## 2021-04-30 DIAGNOSIS — I10 Essential (primary) hypertension: Secondary | ICD-10-CM

## 2021-04-30 DIAGNOSIS — G43809 Other migraine, not intractable, without status migrainosus: Secondary | ICD-10-CM | POA: Diagnosis not present

## 2021-04-30 DIAGNOSIS — E559 Vitamin D deficiency, unspecified: Secondary | ICD-10-CM

## 2021-04-30 DIAGNOSIS — G4700 Insomnia, unspecified: Secondary | ICD-10-CM

## 2021-04-30 DIAGNOSIS — J309 Allergic rhinitis, unspecified: Secondary | ICD-10-CM

## 2021-04-30 LAB — BASIC METABOLIC PANEL
BUN: 18 mg/dL (ref 6–23)
CO2: 29 mEq/L (ref 19–32)
Calcium: 8.6 mg/dL (ref 8.4–10.5)
Chloride: 106 mEq/L (ref 96–112)
Creatinine, Ser: 1.17 mg/dL (ref 0.40–1.20)
GFR: 54.83 mL/min — ABNORMAL LOW (ref >60.00)
Glucose, Bld: 89 mg/dL (ref 70–99)
Potassium: 3.7 mEq/L (ref 3.5–5.1)
Sodium: 140 mEq/L (ref 135–145)

## 2021-04-30 MED ORDER — MULTI-VITAMIN/MINERALS PO TABS: 1.0000 | ORAL_TABLET | Freq: Every day | ORAL | Status: AC

## 2021-04-30 MED ORDER — VITAMIN D3 50 MCG (2000 UT) PO CAPS: 2000.0000 [IU] | ORAL_CAPSULE | Freq: Every day | ORAL | Status: AC

## 2021-04-30 NOTE — Assessment & Plan Note (Signed)
Stable on otc claritin and singulair.  ?

## 2021-04-30 NOTE — Assessment & Plan Note (Signed)
Continues good compliance.  ?

## 2021-04-30 NOTE — Assessment & Plan Note (Signed)
Lab Results  ?Component Value Date  ? TSH 3.52 07/17/2020  ? ?Clinically stable on synthroid 135mg.  Obtain follow up tsh. ?

## 2021-04-30 NOTE — Assessment & Plan Note (Addendum)
Uses xanax and hydroxyzine HS.  She finds this somewhat helpful.  ?

## 2021-04-30 NOTE — Assessment & Plan Note (Signed)
Currently on vit D supplement.  Continue same.  ?

## 2021-04-30 NOTE — Assessment & Plan Note (Signed)
Lab Results  ?Component Value Date  ? HGBA1C 4.8 05/16/2020  ? ? ?

## 2021-04-30 NOTE — Assessment & Plan Note (Signed)
Overall stable.  Has maxalt on hand for prn use.  She also continues topamax twice daily for migraine prevention. ?

## 2021-04-30 NOTE — Assessment & Plan Note (Signed)
Continues lifelong anticoagulation with xarelto.  ?

## 2021-04-30 NOTE — Progress Notes (Signed)
? ?Subjective:  ? ?By signing my name below, I, [name], attest that this documentation has been prepared under the direction and in the presence of [name] NP, 04/30/2021   ? ? Patient ID: Sharon Rivers, female    DOB: 1971-01-02, 51 y.o.   MRN: [id number] ? ?Chief Complaint  ?Patient presents with  ? Hypertension  ?  Here for follow up  ? Hypothyroidism  ?  Here for follow up  ? ? ?HPI ?Patient is in today for an office visit.  ? ?Sleeping - Patient complains of a lack of restful sleep. Her sleep has been sporadic. She takes 0.25 MG of Alprazolam and 25 MG of Hydroxyzine. She does not drink coffee frequently. She goes to bed around the same time consistently, she also has been utilizing breathing exercise. When she places her mask on to go to sleep, she notes that her mouth becomes very dry. ? ?Blood Pressure - Her blood pressure is improving. She is continuing to take 5 MG of Amlodipine, 25 MG of Hydrochlorothiazide and 320 MG of Valsarit.  ?BP Readings from Last 3 Encounters:  ?04/30/21 125/73  ?01/14/21 114/80  ?01/13/21 (!) 131/91  ? ?Weight - Her weight is increasing. She is regularly drinking water, she has not changed her eating patterns and she has been exercising more frequently. ?Wt Readings from Last 3 Encounters:  ?04/30/21 298 lb (135.2 kg)  ?01/14/21 291 lb (132 kg)  ?01/13/21 290 lb (131.5 kg)  ? ?Migraines - Her migraines have been improving. She reported having an intense migraine about a month ago so she took 10 MG of Maxalt which helped her symptoms. She continues to take 50 MG of Topamax.  ? ?Blood Thinner - She denies of any recent blood clotting. She is continuing to take 20 MG of Xarelto  ? ?Allergies - She is continuing to take 10 MG of Singular and OTC Equate.  ? ?Anxiety - She is taking 1000 MG of Zoloft. Her anxiety has not been well due to outside stressors.  ? ?Vitamin D - She is taking 2000 IU of Vitamin D supplements. She also is taking a multivitamin Multimineral  supplement.  ? ?Abnormal Uterine Bleeding - Since her last visit with Dr. Hoyle Sauer, she reports very light bleeding.  ? ?Health Maintenance Due  ?Topic Date Due  ? COVID-19 Vaccine (1) Never done  ? COLONOSCOPY (Pts 45-12yr Insurance coverage will need to be confirmed)  Never done  ? MAMMOGRAM  08/04/2020  ? ? ?Past Medical History:  ?Diagnosis Date  ? Anxiety   ? Back pain   ? Chest pain   ? a. 07/2015 Myoview: EF 71%, medium size, mild intensity, partially reversible septal defect with overlying breast attenuation -> likely artifact. No significant reversible ischemia.  ? Constipation   ? Depression   ? Edema   ? feet and legs  ? Frequent headaches   ? GERD (gastroesophageal reflux disease)   ? History of DVT (deep vein thrombosis) 2018  ? Hypertension   ? Hypothyroidism   ? Insulin resistance   ? Joint pain   ? Migraines   ? OSA (obstructive sleep apnea) 03/17/2016  ? Osteoarthritis   ? Panic anxiety syndrome   ? PONV (postoperative nausea and vomiting)   ? [place] 20[place]  ? SOB (shortness of breath)   ? a. 04/2016 Echo: EF 60-65%, Gr1 DD.  ? Swallowing difficulty   ? ? ?Past Surgical History:  ?Procedure Laterality Date  ? CESAREAN  SECTION  2001 & 2002  ? DILATION AND CURETTAGE OF UTERUS N/A 09/08/2020  ? Procedure: DILATATION AND CURETTAGE;  Surgeon: Lavonia Drafts, MD;  Location: [facility];  Service: Gynecology;  Laterality: N/A;  ? ELBOW SURGERY    ? HYSTEROSCOPY WITH NOVASURE N/A 09/08/2020  ? Procedure: HYSTEROSCOPY WITH NOVASURE;  Surgeon: Lavonia Drafts, MD;  Location: [facility];  Service: Gynecology;  Laterality: N/A;  ? MINOR CARPAL TUNNEL    ? pinched nerve in elbow  ? WISDOM TOOTH EXTRACTION    ? ? ?Family History  ?Problem Relation Age of Onset  ? Hypertension Mother   ?     Living  ? Arthritis Mother   ? Thyroid disease Mother   ? Heart disease Mother   ?     MI at age 61  ? Heart attack Mother   ? Migraines Mother   ? Depression Mother   ? Obesity Mother   ? Congestive Heart  Failure Father   ?     ?CHF  ? Hypertension Maternal Grandmother   ? Parkinson's disease Maternal Grandmother   ? Alzheimer's disease Maternal Grandmother   ? Cancer Maternal Grandfather   ? Hypertension Maternal Grandfather   ? Hypertension Paternal Grandmother   ? Hypertension Paternal Grandfather   ? Gout Brother   ? ADD / ADHD Son   ?     x1  ? Other Son   ?     #2-Unknown  ? Diabetes Neg Hx   ? ? ?Social History  ? ?Socioeconomic History  ? Marital status: Single  ?  Spouse name: Not on file  ? Number of children: 2  ? Years of education: Not on file  ? Highest education level: Associate degree: occupational, Hotel manager, or vocational program  ?Occupational History  ? Occupation: Loss adjuster, chartered  ?  Comment: [employer]  ?Tobacco Use  ? Smoking status: Former  ?  Types: Cigarettes  ?  Quit date: 62  ?  Years since quitting: 31.2  ? Smokeless tobacco: Never  ? Tobacco comments:  ?  quit 1992  ?Vaping Use  ? Vaping Use: Never used  ?Substance and Sexual Activity  ? Alcohol use: No  ?  Alcohol/week: 0.0 standard drinks  ? Drug use: No  ? Sexual activity: Yes  ?  Partners: Male  ?  Birth control/protection: None  ?Other Topics Concern  ? Not on file  ?Social History Narrative  ? She has 2 children (grown). She did not retain custody of these children.  ? Lives with boyfriend in [place].  ? 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 ?Social Determinants of Health  ? ?Financial Resource Strain: Not on file  ?Food Insecurity: Not on file  ?Transportation Needs: Not on file  ?Physical Activity: Not on file  ?Stress: Not on file  ?Social Connections: Not on file  ?Intimate Partner Violence: Not on file  ? ? ?Outpatient Medications Prior to Visit  ?Medication Sig Dispense Refill  ? acetaminophen (TYLENOL) 325 MG tablet Take 2 tablets (650 mg total) by mouth every 6 (six) hours as needed for mild pain (or Fever >/= 101). 20 tablet 0  ? ALPRAZolam (XANAX) 0.25 MG tablet TAKE 1 TABLET BY MOUTH TWICE DAILY AS  NEEDED FOR ANXIETY AND FOR SLEEP 30 tablet 0  ? amLODipine (NORVASC) 5 MG tablet Take 1 tablet (5 mg total) by mouth daily. 90 tablet 1  ? azelastine (ASTELIN) 0.1 % nasal spray Place 1 spray into both  nostrils 2 (two) times daily. Use in each nostril as directed (Patient taking differently: Place 1 spray into both nostrils 2 (two) times daily as needed for allergies. Use in each nostril as directed) 30 mL 5  ? docusate sodium (COLACE) 100 MG capsule Take 100 mg by mouth 2 (two) times daily as needed for mild constipation.    ? fluticasone (FLONASE) 50 MCG/ACT nasal spray Place 2 sprays into both nostrils daily. (Patient taking differently: Place 2 sprays into both nostrils daily as needed for allergies.) 16 g 6  ? hydrOXYzine (VISTARIL) 25 MG capsule TAKE 1 CAPSULE BY MOUTH AT BEDTIME AS NEEDED FOR SLEEP 90 capsule 0  ? levothyroxine (SYNTHROID) 100 MCG tablet Take 1 tablet by mouth once daily 90 tablet 0  ? methocarbamol (ROBAXIN) 500 MG tablet Take 1 tablet (500 mg total) by mouth every 8 (eight) hours as needed for muscle spasms. 20 tablet 0  ? montelukast (SINGULAIR) 10 MG tablet Take 1 tablet (10 mg total) by mouth at bedtime. 90 tablet 1  ? nystatin (MYCOSTATIN/NYSTOP) powder Apply 1 application topically 3 (three) times daily. (Patient taking differently: Apply 1 application. topically 3 (three) times daily as needed (skin irritation (yeast)).) 60 g 1  ? ondansetron (ZOFRAN) 4 MG tablet Take 1 tablet (4 mg total) by mouth every 6 (six) hours as needed for nausea. 20 tablet 0  ? pantoprazole (PROTONIX) 40 MG tablet Take 1 tablet by mouth twice daily 180 tablet 1  ? Polyvinyl Alcohol-Povidone PF (REFRESH) 1.4-0.6 % SOLN Place 1-2 drops into both eyes 3 (three) times daily as needed (dry/irritated eyes).    ? rivaroxaban (XARELTO) 20 MG TABS tablet Take 1 tablet (20 mg total) by mouth daily with supper. 90 tablet 0  ? rizatriptan (MAXALT) 10 MG tablet As needed for headache; may repeat in 2 hours if needed; max  2 per day or 8 per month 8 tablet 6  ? senna-docusate (SENOKOT-S) 8.6-50 MG tablet Take 1 tablet by mouth at bedtime as needed for mild constipation. 30 tablet 0  ? sertraline (ZOLOFT) 100 MG tablet Take

## 2021-04-30 NOTE — Assessment & Plan Note (Signed)
No recent cold sores. Has valtrex for prn use on hand.  ?

## 2021-04-30 NOTE — Assessment & Plan Note (Signed)
BP Readings from Last 3 Encounters:  ?04/30/21 125/73  ?01/14/21 114/80  ?01/13/21 (!) 131/91  ? ?Stable on amlodipine, hctz and valsartan.  ? ?Wt Readings from Last 3 Encounters:  ?04/30/21 298 lb (135.2 kg)  ?01/14/21 291 lb (132 kg)  ?01/13/21 290 lb (131.5 kg)  ? ?Discussed diet/exercise, weight loss.  ?

## 2021-04-30 NOTE — Assessment & Plan Note (Signed)
Reports increased anxiety due to "outside stressors."  Continues zoloft '100mg'$  once daily.  ?

## 2021-05-03 ENCOUNTER — Ambulatory Visit: Payer: Self-pay | Admitting: Family

## 2021-05-04 LAB — VITAMIN D 25 HYDROXY (VIT D DEFICIENCY, FRACTURES): VITD: 14.5 ng/mL — ABNORMAL LOW (ref 30.00–100.00)

## 2021-05-04 LAB — TSH: TSH: 5.44 u[IU]/mL (ref 0.35–5.50)

## 2021-05-05 ENCOUNTER — Telehealth: Payer: Self-pay | Admitting: Family

## 2021-05-05 DIAGNOSIS — E559 Vitamin D deficiency, unspecified: Secondary | ICD-10-CM

## 2021-05-05 DIAGNOSIS — E039 Hypothyroidism, unspecified: Secondary | ICD-10-CM

## 2021-05-05 MED ORDER — VITAMIN D (ERGOCALCIFEROL) 1.25 MG (50000 UNIT) PO CAPS: 50000.0000 [IU] | ORAL_CAPSULE | ORAL | 0 refills | Status: DC

## 2021-05-05 MED ORDER — LEVOTHYROXINE SODIUM 125 MCG PO TABS
125.0000 ug | ORAL_TABLET | Freq: Every day | ORAL | 1 refills | Status: DC
Start: 2021-05-05 — End: 2021-06-22

## 2021-05-05 NOTE — Telephone Encounter (Signed)
I would like her to increase her synthroid from 175mg to 125 mcg.  Repeat tsh in 6 weeks.  ? ?Also, Vitamin D level is low.  Advise patient to begin vit D 50000 units once weekly for 12 weeks, then repeat vit D level (dx Vit D deficiency).   ? ?

## 2021-05-05 NOTE — Telephone Encounter (Signed)
Patient advised of results and new prescriptions. She was scheduled for repeat TSH 06-21-21. Orders in as future ?

## 2021-05-21 ENCOUNTER — Encounter: Payer: Self-pay | Admitting: Cardiology

## 2021-05-24 NOTE — Telephone Encounter (Signed)
Pt has an appt with Dr. Harriet Masson Wednesday.  ?

## 2021-05-26 ENCOUNTER — Ambulatory Visit: Payer: 59 | Admitting: Cardiology

## 2021-05-26 VITALS — BP 134/88 | HR 75 | Ht 62.0 in | Wt 301.0 lb

## 2021-05-26 DIAGNOSIS — Z79899 Other long term (current) drug therapy: Secondary | ICD-10-CM

## 2021-05-26 DIAGNOSIS — R079 Chest pain, unspecified: Secondary | ICD-10-CM

## 2021-05-26 DIAGNOSIS — R0609 Other forms of dyspnea: Secondary | ICD-10-CM

## 2021-05-26 MED ORDER — METOPROLOL TARTRATE 100 MG PO TABS: ORAL_TABLET | ORAL | 0 refills | Status: DC

## 2021-05-26 NOTE — Progress Notes (Signed)
?Cardiology Office Note:   ? ?Date:  05/28/2021  ? ?ID:  Sharon Rivers, DOB [birth year], MRN [id number] ? ?PCP:  [name], NP  ?Cardiologist:  Berniece Salines, DO  ?Electrophysiologist:  None  ? ?Referring MD: [name], NP  ? ?" I am doing fine" ? ?History of Present Illness:   ? ?Sharon Rivers is a 51 y.o. female with a hx of hx of DVT with pulmonary embolus and on Xarelto, morbid obesity, hypertension, OSA now using her CPAP and is very happy with this, is here today for follow-up visit.  ?  ?I saw the patient for the first time in June 2022 at that time she reported that she had been experiencing some coughing she was on lisinopril suspicious that this was playing a role we will stop lisinopril started patient on valsartan.  Coughing has stopped and she had tolerated the valsartan.  ? ?At her visit on 10/13/2020 she was hypertensive I started that patient on HCTZ 12.5 mg daily, continue her Amlodipine 5 mg daily, and increased her valsartan 320 mg daily.  ? ?She is here today and complains of intermittent chest pain. She describes this as a mid sternal chest pain and tightness.  ? ?Past Medical History:  ?Diagnosis Date  ? Anxiety   ? Back pain   ? Chest pain   ? a. 07/2015 Myoview: EF 71%, medium size, mild intensity, partially reversible septal defect with overlying breast attenuation -> likely artifact. No significant reversible ischemia.  ? Constipation   ? Depression   ? Edema   ? feet and legs  ? Frequent headaches   ? GERD (gastroesophageal reflux disease)   ? History of DVT (deep vein thrombosis) 2018  ? Hypertension   ? Hypothyroidism   ? Insulin resistance   ? Joint pain   ? Migraines   ? OSA (obstructive sleep apnea) 03/17/2016  ? Osteoarthritis   ? Panic anxiety syndrome   ? PONV (postoperative nausea and vomiting)   ? Pulmonary embolism ([facility]) 2022  ? SOB (shortness of breath)   ? a. 04/2016 Echo: EF 60-65%, Gr1 DD.  ? Swallowing difficulty   ? ? ?Past Surgical History:  ?Procedure  Laterality Date  ? CESAREAN SECTION  2001 & 2002  ? DILATION AND CURETTAGE OF UTERUS N/A 09/08/2020  ? Procedure: DILATATION AND CURETTAGE;  Surgeon: Lavonia Drafts, MD;  Location: [facility];  Service: Gynecology;  Laterality: N/A;  ? ELBOW SURGERY    ? HYSTEROSCOPY WITH NOVASURE N/A 09/08/2020  ? Procedure: HYSTEROSCOPY WITH NOVASURE;  Surgeon: Lavonia Drafts, MD;  Location: [facility];  Service: Gynecology;  Laterality: N/A;  ? MINOR CARPAL TUNNEL    ? pinched nerve in elbow  ? WISDOM TOOTH EXTRACTION    ? ? ?Current Medications: ?Current Meds  ?Medication Sig  ? acetaminophen (TYLENOL) 325 MG tablet Take 2 tablets (650 mg total) by mouth every 6 (six) hours as needed for mild pain (or Fever >/= 101).  ? amLODipine (NORVASC) 5 MG tablet Take 1 tablet (5 mg total) by mouth daily.  ? azelastine (ASTELIN) 0.1 % nasal spray Place 1 spray into both nostrils 2 (two) times daily. Use in each nostril as directed (Patient taking differently: Place 1 spray into both nostrils 2 (two) times daily as needed for allergies. Use in each nostril as directed)  ? Cholecalciferol (VITAMIN D3) 50 MCG (2000 UT) capsule Take 1 capsule (2,000 Units total) by mouth daily.  ? docusate sodium (COLACE) 100 MG capsule Take 100  mg by mouth 2 (two) times daily as needed for mild constipation.  ? fluticasone (FLONASE) 50 MCG/ACT nasal spray Place 2 sprays into both nostrils daily. (Patient taking differently: Place 2 sprays into both nostrils daily as needed for allergies.)  ? hydrOXYzine (VISTARIL) 25 MG capsule TAKE 1 CAPSULE BY MOUTH AT BEDTIME AS NEEDED FOR SLEEP  ? levothyroxine (SYNTHROID) 125 MCG tablet Take 1 tablet (125 mcg total) by mouth daily.  ? methocarbamol (ROBAXIN) 500 MG tablet Take 1 tablet (500 mg total) by mouth every 8 (eight) hours as needed for muscle spasms.  ? metoprolol tartrate (LOPRESSOR) 100 MG tablet Take 2 hours prior to CT  ? montelukast (SINGULAIR) 10 MG tablet Take 1 tablet (10 mg total) by mouth at  bedtime.  ? Multiple Vitamins-Minerals (MULTIVITAMIN WITH MINERALS) tablet Take 1 tablet by mouth daily.  ? nystatin (MYCOSTATIN/NYSTOP) powder Apply 1 application topically 3 (three) times daily. (Patient taking differently: Apply 1 application. topically 3 (three) times daily as needed (skin irritation (yeast)).)  ? ondansetron (ZOFRAN) 4 MG tablet Take 1 tablet (4 mg total) by mouth every 6 (six) hours as needed for nausea.  ? pantoprazole (PROTONIX) 40 MG tablet Take 1 tablet by mouth twice daily  ? Polyvinyl Alcohol-Povidone PF (REFRESH) 1.4-0.6 % SOLN Place 1-2 drops into both eyes 3 (three) times daily as needed (dry/irritated eyes).  ? rivaroxaban (XARELTO) 20 MG TABS tablet Take 1 tablet (20 mg total) by mouth daily with supper.  ? rizatriptan (MAXALT) 10 MG tablet As needed for headache; may repeat in 2 hours if needed; max 2 per day or 8 per month  ? senna-docusate (SENOKOT-S) 8.6-50 MG tablet Take 1 tablet by mouth at bedtime as needed for mild constipation.  ? sertraline (ZOLOFT) 100 MG tablet Take 1.5 tablets (150 mg total) by mouth daily.  ? topiramate (TOPAMAX) 50 MG tablet Take 1 tablet (50 mg total) by mouth 2 (two) times daily.  ? valsartan (DIOVAN) 320 MG tablet Take 1 tablet (320 mg total) by mouth daily.  ? Vitamin D, Ergocalciferol, (DRISDOL) 1.25 MG (50000 UNIT) CAPS capsule Take 1 capsule (50,000 Units total) by mouth every 7 (seven) days.  ? [DISCONTINUED] ALPRAZolam (XANAX) 0.25 MG tablet TAKE 1 TABLET BY MOUTH TWICE DAILY AS NEEDED FOR ANXIETY AND FOR SLEEP  ?  ? ?Allergies:   Megace [megestrol], Adhesive [tape], Biofreeze [menthol (topical analgesic)], Camphor, Folic acid, Lisinopril, Menthol, and Sulfa antibiotics  ? ?Social History  ? ?Socioeconomic History  ? Marital status: Single  ?  Spouse name: Not on file  ? Number of children: 2  ? Years of education: Not on file  ? Highest education level: Associate degree: occupational, Hotel manager, or vocational program  ?Occupational History   ? Occupation: Loss adjuster, chartered  ?  Comment: [employer]  ?Tobacco Use  ? Smoking status: Former  ?  Types: Cigarettes  ?  Quit date: 31  ?  Years since quitting: 31.3  ? Smokeless tobacco: Never  ? Tobacco comments:  ?  quit 1992  ?Vaping Use  ? Vaping Use: Never used  ?Substance and Sexual Activity  ? Alcohol use: No  ?  Alcohol/week: 0.0 standard drinks  ? Drug use: No  ? Sexual activity: Yes  ?  Partners: Male  ?  Birth control/protection: None  ?Other Topics Concern  ? Not on file  ?Social History Narrative  ? She has 2 children (grown). She did not retain custody of these children.  ? Lives with boyfriend in  [place].  ? 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 ?Social Determinants of Health  ? ?Financial Resource Strain: Not on file  ?Food Insecurity: Not on file  ?Transportation Needs: Not on file  ?Physical Activity: Not on file  ?Stress: Not on file  ?Social Connections: Not on file  ?  ? ?Family History: ?The patient's family history includes ADD / ADHD in her son; Alzheimer's disease in her maternal grandmother; Arthritis in her mother; Cancer in her maternal grandfather; Congestive Heart Failure in her father; Depression in her mother; Gout in her brother; Heart attack in her mother; Heart disease in her mother; Hypertension in her maternal grandfather, maternal grandmother, mother, paternal grandfather, and paternal grandmother; Migraines in her mother; Obesity in her mother; Other in her son; Parkinson's disease in her maternal grandmother; Thyroid disease in her mother. There is no history of Diabetes. ? ?ROS:   ?Review of Systems  ?Constitution: Negative for decreased appetite, fever and weight gain.  ?HENT: Negative for congestion, ear discharge, hoarse voice and sore throat.   ?Eyes: Negative for discharge, redness, vision loss in right eye and visual halos.  ?Cardiovascular: Negative for chest pain, dyspnea on exertion, leg swelling, orthopnea and palpitations.  ?Respiratory:  Negative for cough, hemoptysis, shortness of breath and snoring.   ?Endocrine: Negative for heat intolerance and polyphagia.  ?Hematologic/Lymphatic: Negative for bleeding problem. Does not bruise/bleed easily.  ?Skin

## 2021-05-26 NOTE — Patient Instructions (Addendum)
Medication Instructions:  ?Your physician recommends that you continue on your current medications as directed. Please refer to the Current Medication list given to you today.  ?*If you need a refill on your cardiac medications before your next appointment, please call your pharmacy* ? ? ?Lab Work: ?Your physician recommends that you return for lab work in:  ?BMET and Mag ?If you have labs (blood work) drawn today and your tests are completely normal, you will receive your results only by: ?MyChart Message (if you have MyChart) OR ?A paper copy in the mail ?If you have any lab test that is abnormal or we need to change your treatment, we will call you to review the results. ? ? ?Testing/Procedures: ? ? ?Your cardiac CT will be scheduled at one of the below locations:  ? ?[facility] ?[address] ?[place], [place] [address] ?[phone] ? ? ?If scheduled at [facility], please arrive at the [facility] and Children's Entrance (Entrance C2) of [facility] 30 minutes prior to test start time. ?You can use the FREE valet parking offered at entrance C (encouraged to control the heart rate for the test)  ?Proceed to the [facility] Radiology Department (first floor) to check-in and test prep. ? ?All radiology patients and guests should use entrance C2 at [facility], accessed from [facility], even though the hospital's physical address listed is [address]. ? ? ? ? ? ?Please follow these instructions carefully (unless otherwise directed): ? ?On the Night Before the Test: ?Be sure to Drink plenty of water. ?Do not consume any caffeinated/decaffeinated beverages or chocolate 12 hours prior to your test. ?Do not take any antihistamines 12 hours prior to your test. ? ?On the Day of the Test: ?Drink plenty of water until 1 hour prior to the test. ?Do not eat any food 4 hours prior to the test. ?You may take your regular medications prior to the test.  ?Take  metoprolol (Lopressor) two hours prior to test. ?HOLD Furosemide/Hydrochlorothiazide morning of the test. ?FEMALES- please wear underwire-free bra if available, avoid dresses & tight clothing ? ?After the Test: ?Drink plenty of water. ?After receiving IV contrast, you may experience a mild flushed feeling. This is normal. ?On occasion, you may experience a mild rash up to 24 hours after the test. This is not dangerous. If this occurs, you can take Benadryl 25 mg and increase your fluid intake. ?If you experience trouble breathing, this can be serious. If it is severe call 911 IMMEDIATELY. If it is mild, please call our office. ?If you take any of these medications: Glipizide/Metformin, Avandament, Glucavance, please do not take 48 hours after completing test unless otherwise instructed. ? ?We will call to schedule your test 2-4 weeks out understanding that some insurance companies will need an authorization prior to the service being performed.  ? ?For non-scheduling related questions, please contact the cardiac imaging nurse navigator should you have any questions/concerns: ?Marchia Bond, Cardiac Imaging Nurse Navigator ?Gordy Clement, Cardiac Imaging Nurse Navigator ?[facility] ?Direct Office Dial: [phone]  ? ?For scheduling needs, including cancellations and rescheduling, please call [place], [phone]. ? ? ? ?Follow-Up: ?At [facility], you and your health needs are our priority.  As part of our continuing mission to provide you with exceptional heart care, we have created designated Provider Care Teams.  These Care Teams include your primary Cardiologist (physician) and Advanced Practice Providers (APPs -  Physician Assistants and  Nurse Practitioners) who all work together to provide you with the care you need, when you need it. ? ?We recommend signing up for the patient portal called "MyChart".  Sign up information is provided on this After Visit Summary.  MyChart is  used to connect with patients for Virtual Visits (Telemedicine).  Patients are able to view lab/test results, encounter notes, upcoming appointments, etc.  Non-urgent messages can be sent to your provider as well.   ?To learn more about what you can do with MyChart, go to [email or web address].   ? ?Your next appointment:   ?6 month(s) ? ?The format for your next appointment:   ?In Person ? ?Provider:   ?Berniece Salines, DO   ? ? ?Other Instructions ? ? ?Important Information About Sugar ? ? ? ? ?  ?

## 2021-05-28 ENCOUNTER — Other Ambulatory Visit: Payer: Self-pay

## 2021-05-28 MED ORDER — ALPRAZOLAM 0.25 MG PO TABS: ORAL_TABLET | ORAL | 0 refills | Status: DC

## 2021-05-28 NOTE — Telephone Encounter (Signed)
Last RX: 04-09-2021 ?Last OV:04-30-2021 ?Next OV:no future appointments ?UDS:04-21-2020 ?CSC:04-21-2020  ?

## 2021-06-02 LAB — BASIC METABOLIC PANEL
BUN/Creatinine Ratio: 15 (ref 9–23)
BUN: 15 mg/dL (ref 6–24)
CO2: 22 mmol/L (ref 20–29)
Calcium: 8.9 mg/dL (ref 8.7–10.2)
Chloride: 102 mmol/L (ref 96–106)
Creatinine, Ser: 1 mg/dL (ref 0.57–1.00)
Glucose: 106 mg/dL — ABNORMAL HIGH (ref 70–99)
Potassium: 3.6 mmol/L (ref 3.5–5.2)
Sodium: 138 mmol/L (ref 134–144)
eGFR: 69 mL/min/{1.73_m2} (ref >59)

## 2021-06-02 LAB — MAGNESIUM: Magnesium: 1.9 mg/dL (ref 1.6–2.3)

## 2021-06-04 ENCOUNTER — Other Ambulatory Visit: Payer: Self-pay | Admitting: Family

## 2021-06-07 ENCOUNTER — Telehealth (HOSPITAL_COMMUNITY): Payer: Self-pay | Admitting: *Deleted

## 2021-06-07 NOTE — Telephone Encounter (Signed)
Reaching out to patient to offer assistance regarding upcoming cardiac imaging study; pt verbalizes understanding of appt date/time, parking situation and where to check in, pre-test NPO status and medications ordered, and verified current allergies; name and call back number provided for further questions should they arise  Cameron Katayama RN Navigator Cardiac Imaging [facility] [phone] office [phone] cell  Patient to take 100mg metoprolol tartrate two hours prior to her cardiac CT scan.  She is aware to arrive at 2pm. 

## 2021-06-08 ENCOUNTER — Encounter (HOSPITAL_COMMUNITY): Payer: Self-pay

## 2021-06-08 ENCOUNTER — Ambulatory Visit (HOSPITAL_COMMUNITY)
Admission: RE | Admit: 2021-06-08 | Discharge: 2021-06-08 | Disposition: A | Discharge status: Home / Self Care | Payer: 59 | Source: Ambulatory Visit | Attending: Cardiology | Admitting: Cardiology

## 2021-06-08 DIAGNOSIS — R079 Chest pain, unspecified: Secondary | ICD-10-CM

## 2021-06-08 MED ORDER — NITROGLYCERIN 0.4 MG SL SUBL
0.8000 mg | SUBLINGUAL_TABLET | Freq: Once | SUBLINGUAL | Status: AC
  Administered 2021-06-08: 0.8 mg via SUBLINGUAL

## 2021-06-08 MED ORDER — IOHEXOL 350 MG/ML SOLN
100.0000 mL | Freq: Once | INTRAVENOUS | Status: AC | PRN
  Administered 2021-06-08: 100 mL via INTRAVENOUS

## 2021-06-08 MED ORDER — NITROGLYCERIN 0.4 MG SL SUBL
SUBLINGUAL_TABLET | SUBLINGUAL | Status: AC
  Filled 2021-06-08: qty 2

## 2021-06-21 ENCOUNTER — Other Ambulatory Visit (INDEPENDENT_AMBULATORY_CARE_PROVIDER_SITE_OTHER): Payer: 59

## 2021-06-21 ENCOUNTER — Telehealth: Payer: Self-pay | Admitting: Family

## 2021-06-21 DIAGNOSIS — E039 Hypothyroidism, unspecified: Secondary | ICD-10-CM | POA: Diagnosis not present

## 2021-06-21 LAB — TSH: TSH: 8.13 u[IU]/mL — ABNORMAL HIGH (ref 0.35–5.50)

## 2021-06-21 NOTE — Telephone Encounter (Signed)
Has she missed any doses of synthroid?  If so, please restart daily in AM on empty stomach. If not, then we need to increase her synthroid. Let me know. She will need repeat TSH in 6 weeks either way.  ?

## 2021-06-22 ENCOUNTER — Other Ambulatory Visit: Payer: Self-pay

## 2021-06-22 DIAGNOSIS — E039 Hypothyroidism, unspecified: Secondary | ICD-10-CM

## 2021-06-22 MED ORDER — LEVOTHYROXINE SODIUM 150 MCG PO TABS: 150.0000 ug | ORAL_TABLET | Freq: Every day | ORAL | 1 refills | Status: DC

## 2021-06-22 NOTE — Telephone Encounter (Signed)
OK. Please increase synthroid to 150 mcg and repeat TSH in 6 weeks.  ?

## 2021-06-22 NOTE — Telephone Encounter (Signed)
Patient advised of new medication dose and scheduled to come back for tsh 08-06-2021 ?

## 2021-06-24 ENCOUNTER — Other Ambulatory Visit: Payer: Self-pay | Admitting: Family

## 2021-06-24 NOTE — Telephone Encounter (Signed)
Requesting: alprazolam Contract: 04/21/20 UDS: Last Visit: 04/30/21 Next Visit: none Last Refill: 05/28/21  Please Advise

## 2021-06-25 MED ORDER — ALPRAZOLAM 0.25 MG PO TABS: ORAL_TABLET | ORAL | 0 refills | Status: DC

## 2021-06-28 ENCOUNTER — Encounter: Payer: Self-pay | Admitting: Family

## 2021-06-28 ENCOUNTER — Encounter: Payer: Self-pay | Admitting: Cardiology

## 2021-06-28 ENCOUNTER — Inpatient Hospital Stay: Payer: 59 | Attending: Family | Admitting: Family

## 2021-06-28 ENCOUNTER — Inpatient Hospital Stay: Payer: 59

## 2021-06-28 VITALS — BP 123/94 | HR 80 | Temp 98.3°F | Resp 18 | Wt 303.1 lb

## 2021-06-28 DIAGNOSIS — I824Y9 Acute embolism and thrombosis of unspecified deep veins of unspecified proximal lower extremity: Secondary | ICD-10-CM

## 2021-06-28 DIAGNOSIS — D509 Iron deficiency anemia, unspecified: Secondary | ICD-10-CM

## 2021-06-28 DIAGNOSIS — E7211 Homocystinuria: Secondary | ICD-10-CM | POA: Diagnosis not present

## 2021-06-28 DIAGNOSIS — I82401 Acute embolism and thrombosis of unspecified deep veins of right lower extremity: Secondary | ICD-10-CM | POA: Insufficient documentation

## 2021-06-28 DIAGNOSIS — R760 Raised antibody titer: Secondary | ICD-10-CM

## 2021-06-28 DIAGNOSIS — D6862 Lupus anticoagulant syndrome: Secondary | ICD-10-CM | POA: Insufficient documentation

## 2021-06-28 DIAGNOSIS — Z7901 Long term (current) use of anticoagulants: Secondary | ICD-10-CM | POA: Insufficient documentation

## 2021-06-28 DIAGNOSIS — I2699 Other pulmonary embolism without acute cor pulmonale: Secondary | ICD-10-CM | POA: Diagnosis present

## 2021-06-28 DIAGNOSIS — R7983 Abnormal findings of blood amino-acid level: Secondary | ICD-10-CM

## 2021-06-28 LAB — CBC WITH DIFFERENTIAL (CANCER CENTER ONLY)
Abs Immature Granulocytes: 0.03 10*3/uL (ref 0.00–0.07)
Basophils Absolute: 0.1 10*3/uL (ref 0.0–0.1)
Basophils Relative: 1 %
Eosinophils Absolute: 0.3 10*3/uL (ref 0.0–0.5)
Eosinophils Relative: 5 %
HCT: 35.6 % — ABNORMAL LOW (ref 36.0–46.0)
Hemoglobin: 12 g/dL (ref 12.0–15.0)
Immature Granulocytes: 0 %
Lymphocytes Relative: 19 %
Lymphs Abs: 1.3 10*3/uL (ref 0.7–4.0)
MCH: 29.3 pg (ref 26.0–34.0)
MCHC: 33.7 g/dL (ref 30.0–36.0)
MCV: 87 fL (ref 80.0–100.0)
Monocytes Absolute: 0.5 10*3/uL (ref 0.1–1.0)
Monocytes Relative: 7 %
Neutro Abs: 4.8 10*3/uL (ref 1.7–7.7)
Neutrophils Relative %: 68 %
Platelet Count: 297 10*3/uL (ref 150–400)
RBC: 4.09 MIL/uL (ref 3.87–5.11)
RDW: 13.6 % (ref 11.5–15.5)
WBC Count: 7.1 10*3/uL (ref 4.0–10.5)
nRBC: 0 % (ref 0.0–0.2)

## 2021-06-28 LAB — CMP (CANCER CENTER ONLY)
ALT: 18 U/L (ref 0–44)
AST: 16 U/L (ref 15–41)
Albumin: 3.8 g/dL (ref 3.5–5.0)
Alkaline Phosphatase: 61 U/L (ref 38–126)
Anion gap: 11 (ref 5–15)
BUN: 19 mg/dL (ref 6–20)
CO2: 24 mmol/L (ref 22–32)
Calcium: 9.1 mg/dL (ref 8.9–10.3)
Chloride: 104 mmol/L (ref 98–111)
Creatinine: 1.04 mg/dL — ABNORMAL HIGH (ref 0.44–1.00)
GFR, Estimated: >60 mL/min (ref >60)
Glucose, Bld: 112 mg/dL — ABNORMAL HIGH (ref 70–99)
Potassium: 3.4 mmol/L — ABNORMAL LOW (ref 3.5–5.1)
Sodium: 139 mmol/L (ref 135–145)
Total Bilirubin: 0.2 mg/dL — ABNORMAL LOW (ref 0.3–1.2)
Total Protein: 7.5 g/dL (ref 6.5–8.1)

## 2021-06-28 NOTE — Progress Notes (Signed)
Hematology and Oncology Follow Up Visit  Sharon Rivers [id number] 03-06-1970 51 y.o. 06/28/2021   Principle Diagnosis:  DVT right lower extremity and bilateral pulmonary emboli with right heart strain  Lupus anticoagulant positive Hyperhomocystinemia     Current Therapy:        Xarelto 20 mg PO daily - life long   Interim History:  Ms. Sharon Rivers is here today for follow-up. She is doing well but notes fatigue.  She has had mild SOB with activity since diagnosis with PE. She takes a break to rest when needed.  No fever, chills, n/v, cough, rash, dizziness, chest pain, palpitations, abdominal pain or changes in bowel or bladder habits.  No swelling or tenderness in her extremities at this time.  She has intermittent numbness or tingling in her left leg due to lower back issues and bulging disc.  No falls or syncope.  Appetite and hydration are good. Her weight is stable at 303 lbs.  ECOG Performance Status: 0 - Asymptomatic  Medications:  Allergies as of 06/28/2021       Reactions   Megace [megestrol] Other (See Comments)   Pt is s/p PE and DVT on low dose Megace   Adhesive [tape] Other (See Comments)   Skin Burn.   Biofreeze [menthol (topical Analgesic)] Other (See Comments)   Skin Burn.   Camphor Other (See Comments)   Burning sensation.   Folic Acid Hives   Lisinopril Cough   Menthol Other (See Comments)   Burning sensation.   Sulfa Antibiotics Rash        Medication List        Accurate as of Jun 28, 2021  2:25 PM. If you have any questions, ask your nurse or doctor.          acetaminophen 325 MG tablet Commonly known as: TYLENOL Take 2 tablets (650 mg total) by mouth every 6 (six) hours as needed for mild pain (or Fever >/= 101).   ALPRAZolam 0.25 MG tablet Commonly known as: XANAX TAKE 1 TABLET BY MOUTH TWICE DAILY AS NEEDED FOR ANXIETY AND FOR SLEEP   amLODipine 5 MG tablet Commonly known as: NORVASC Take 1 tablet (5 mg total) by mouth daily.    azelastine 0.1 % nasal spray Commonly known as: ASTELIN Place 1 spray into both nostrils 2 (two) times daily. Use in each nostril as directed What changed:  when to take this reasons to take this   docusate sodium 100 MG capsule Commonly known as: COLACE Take 100 mg by mouth 2 (two) times daily as needed for mild constipation.   fluticasone 50 MCG/ACT nasal spray Commonly known as: FLONASE Place 2 sprays into both nostrils daily. What changed:  when to take this reasons to take this   hydrochlorothiazide 12.5 MG capsule Commonly known as: MICROZIDE Take 1 capsule (12.5 mg total) by mouth daily.   hydrOXYzine 25 MG capsule Commonly known as: VISTARIL TAKE 1 CAPSULE BY MOUTH AT BEDTIME AS NEEDED FOR SLEEP   levothyroxine 150 MCG tablet Commonly known as: SYNTHROID Take 1 tablet (150 mcg total) by mouth daily.   methocarbamol 500 MG tablet Commonly known as: Robaxin Take 1 tablet (500 mg total) by mouth every 8 (eight) hours as needed for muscle spasms.   metoprolol tartrate 100 MG tablet Commonly known as: LOPRESSOR Take 2 hours prior to CT   montelukast 10 MG tablet Commonly known as: SINGULAIR TAKE 1 TABLET BY MOUTH AT BEDTIME   multivitamin with minerals tablet Take 1 tablet  by mouth daily.   nystatin powder Commonly known as: MYCOSTATIN/NYSTOP Apply 1 application topically 3 (three) times daily. What changed:  when to take this reasons to take this   ondansetron 4 MG tablet Commonly known as: ZOFRAN Take 1 tablet (4 mg total) by mouth every 6 (six) hours as needed for nausea.   pantoprazole 40 MG tablet Commonly known as: PROTONIX Take 1 tablet by mouth twice daily   Refresh 1.4-0.6 % Soln Generic drug: Polyvinyl Alcohol-Povidone PF Place 1-2 drops into both eyes 3 (three) times daily as needed (dry/irritated eyes).   rivaroxaban 20 MG Tabs tablet Commonly known as: Xarelto Take 1 tablet (20 mg total) by mouth daily with supper.   rizatriptan 10  MG tablet Commonly known as: MAXALT As needed for headache; may repeat in 2 hours if needed; max 2 per day or 8 per month   senna-docusate 8.6-50 MG tablet Commonly known as: Senokot-S Take 1 tablet by mouth at bedtime as needed for mild constipation.   sertraline 100 MG tablet Commonly known as: ZOLOFT Take 1.5 tablets (150 mg total) by mouth daily.   topiramate 50 MG tablet Commonly known as: TOPAMAX Take 1 tablet (50 mg total) by mouth 2 (two) times daily.   valsartan 320 MG tablet Commonly known as: DIOVAN Take 1 tablet (320 mg total) by mouth daily.   Vitamin D (Ergocalciferol) 1.25 MG (50000 UNIT) Caps capsule Commonly known as: DRISDOL Take 1 capsule (50,000 Units total) by mouth every 7 (seven) days.   Vitamin D3 50 MCG (2000 UT) capsule Take 1 capsule (2,000 Units total) by mouth daily.        Allergies:  Allergies  Allergen Reactions   Megace [Megestrol] Other (See Comments)    Pt is s/p PE and DVT on low dose Megace   Adhesive [Tape] Other (See Comments)    Skin Burn.   Biofreeze [Menthol (Topical Analgesic)] Other (See Comments)    Skin Burn.   Camphor Other (See Comments)    Burning sensation.   Folic Acid Hives   Lisinopril Cough   Menthol Other (See Comments)    Burning sensation.   Sulfa Antibiotics Rash    Past Medical History, Surgical history, Social history, and Family History were reviewed and updated.  Review of Systems: All other 10 point review of systems is negative.   Physical Exam:  weight is 303 lb 1.9 oz (137.5 kg) (abnormal). Her oral temperature is 98.3 F (36.8 C). Her blood pressure is 123/94 (abnormal) and her pulse is 80. Her respiration is 18 and oxygen saturation is 99%.   Wt Readings from Last 3 Encounters:  06/28/21 (!) 303 lb 1.9 oz (137.5 kg)  05/26/21 (!) 301 lb (136.5 kg)  04/30/21 298 lb (135.2 kg)    Ocular: Sclerae unicteric, pupils equal, round and reactive to light Ear-nose-throat: Oropharynx clear,  dentition fair Lymphatic: No cervical or supraclavicular adenopathy Lungs no rales or rhonchi, good excursion bilaterally Heart regular rate and rhythm, no murmur appreciated Abd soft, nontender, positive bowel sounds MSK no focal spinal tenderness, no joint edema Neuro: non-focal, well-oriented, appropriate affect Breasts: Deferred   Lab Results  Component Value Date   WBC 7.1 06/28/2021   HGB 12.0 06/28/2021   HCT 35.6 (L) 06/28/2021   MCV 87.0 06/28/2021   PLT 297 06/28/2021   No results found for: FERRITIN, IRON, TIBC, UIBC, IRONPCTSAT Lab Results  Component Value Date   RBC 4.09 06/28/2021   No results found for: KPAFRELGTCHN, LAMBDASER,  KAPLAMBRATIO No results found for: IGGSERUM, IGA, IGMSERUM No results found for: Ronnald Ramp, A1GS, A2GS, Tillman Sers, SPEI   Chemistry      Component Value Date/Time   NA 138 06/01/2021 1624   K 3.6 06/01/2021 1624   CL 102 06/01/2021 1624   CO2 22 06/01/2021 1624   BUN 15 06/01/2021 1624   CREATININE 1.00 06/01/2021 1624   CREATININE 0.94 12/28/2020 1413   CREATININE 0.86 08/14/2020 1617      Component Value Date/Time   CALCIUM 8.9 06/01/2021 1624   ALKPHOS 57 12/28/2020 1413   AST 13 (L) 12/28/2020 1413   ALT 14 12/28/2020 1413   BILITOT 0.5 12/28/2020 1413       Impression and Plan: Ms. Strauss is a very pleasant 51 yo caucasian female with recurrent right lower extremity DVT and now bilateral PE's. She had an elevated homocystine level and positive lupus anticoagulant.  She will continue her same regimen with Xarelto.  Follow-up in 6 months.   Lottie Dawson, NP 5/22/20232:25 PM

## 2021-06-29 LAB — HOMOCYSTEINE: Homocysteine: 14.1 umol/L (ref 0.0–14.5)

## 2021-06-30 LAB — LUPUS ANTICOAGULANT PANEL
DRVVT: 58.3 s — ABNORMAL HIGH (ref 0.0–47.0)
PTT Lupus Anticoagulant: 35 s (ref 0.0–43.5)

## 2021-06-30 LAB — DRVVT MIX: dRVVT Mix: 51.1 s — ABNORMAL HIGH (ref 0.0–40.4)

## 2021-06-30 LAB — DRVVT CONFIRM: dRVVT Confirm: 1.2 ratio (ref 0.8–1.2)

## 2021-07-06 MED ORDER — FUROSEMIDE 20 MG PO TABS: 20.0000 mg | ORAL_TABLET | Freq: Every day | ORAL | 11 refills | Status: DC

## 2021-07-06 MED ORDER — POTASSIUM CHLORIDE ER 10 MEQ PO TBCR: 10.0000 meq | EXTENDED_RELEASE_TABLET | Freq: Every day | ORAL | 11 refills | Status: DC

## 2021-07-19 ENCOUNTER — Other Ambulatory Visit: Payer: Self-pay | Admitting: Family

## 2021-07-19 ENCOUNTER — Other Ambulatory Visit: Payer: Self-pay | Admitting: Cardiology

## 2021-07-19 ENCOUNTER — Encounter: Payer: Self-pay | Admitting: Family

## 2021-07-20 MED ORDER — TRAZODONE HCL 50 MG PO TABS: 50.0000 mg | ORAL_TABLET | Freq: Every day | ORAL | 2 refills | Status: DC

## 2021-07-20 MED ORDER — SERTRALINE HCL 100 MG PO TABS: 100.0000 mg | ORAL_TABLET | Freq: Every day | ORAL | 1 refills | Status: DC

## 2021-07-20 NOTE — Telephone Encounter (Signed)
Pt needs follow up vitamin D level prior to vit D refills.

## 2021-07-20 NOTE — Telephone Encounter (Signed)
Would you like Pt to continue ergocalciferol?

## 2021-07-20 NOTE — Telephone Encounter (Signed)
Patient advised we will refill the medication if indicated after she gets labs on 08-06-21.

## 2021-07-25 ENCOUNTER — Other Ambulatory Visit: Payer: Self-pay | Admitting: Family

## 2021-08-03 ENCOUNTER — Encounter: Payer: Self-pay | Admitting: Family

## 2021-08-04 ENCOUNTER — Ambulatory Visit (INDEPENDENT_AMBULATORY_CARE_PROVIDER_SITE_OTHER): Payer: 59 | Admitting: Family

## 2021-08-04 VITALS — BP 128/94 | HR 84 | Temp 98.2°F | Resp 18 | Ht 62.0 in | Wt 302.0 lb

## 2021-08-04 DIAGNOSIS — I10 Essential (primary) hypertension: Secondary | ICD-10-CM | POA: Diagnosis not present

## 2021-08-04 DIAGNOSIS — L0291 Cutaneous abscess, unspecified: Secondary | ICD-10-CM | POA: Insufficient documentation

## 2021-08-04 MED ORDER — DOXYCYCLINE HYCLATE 100 MG PO TABS: 100.0000 mg | ORAL_TABLET | Freq: Two times a day (BID) | ORAL | 0 refills | Status: DC

## 2021-08-04 NOTE — Progress Notes (Signed)
Subjective:   By signing my name below, I, [name], attest that this documentation has been prepared under the direction and in the presence of [place], NP 08/04/2021      Patient ID: Sharon Rivers, female    DOB: 1970-04-12, 51 y.o.   MRN: [id number]  Chief Complaint  Patient presents with   skin check    Abscess , onset: 2 days -- tender, redness    HPI Patient is in today for an office visit  Abscess : She complains of an abscess slightly underneath her right arm pit. She noticed the abscess on 08/02/2021 and reports that there is redness around the area and tenderness to the area.   Blood Pressure: As of today's visit, her blood pressure is elevated. She is regularly taking her blood pressure medications.  BP Readings from Last 3 Encounters:  08/04/21 (!) 128/94  06/28/21 (!) 123/94  06/08/21 115/76   Pulse Readings from Last 3 Encounters:  08/04/21 84  06/28/21 80  06/08/21 73    Health Maintenance Due  Topic Date Due   COVID-19 Vaccine (1) Never done   COLONOSCOPY (Pts 45-86yr Insurance coverage will need to be confirmed)  Never done   MAMMOGRAM  08/04/2020    Past Medical History:  Diagnosis Date   Anxiety    Back pain    Chest pain    a. 07/2015 Myoview: EF 71%, medium size, mild intensity, partially reversible septal defect with overlying breast attenuation -> likely artifact. No significant reversible ischemia.   Constipation    Depression    Edema    feet and legs   Frequent headaches    GERD (gastroesophageal reflux disease)    History of DVT (deep vein thrombosis) 2018   Hypertension    Hypothyroidism    Insulin resistance    Joint pain    Migraines    OSA (obstructive sleep apnea) 03/17/2016   Osteoarthritis    Panic anxiety syndrome    PONV (postoperative nausea and vomiting)    Pulmonary embolism ([facility] 2022   SOB (shortness of breath)    a. 04/2016 Echo: EF 60-65%, Gr1 DD.   Swallowing difficulty     Past Surgical  History:  Procedure Laterality Date   CESAREAN SECTION  2001 & 2002   DILATION AND CURETTAGE OF UTERUS N/A 09/08/2020   Procedure: DILATATION AND CURETTAGE;  Surgeon: HLavonia Drafts MD;  [facility] [facility]  Service: Gynecology;  Laterality: N/A;   ELBOW SURGERY     HYSTEROSCOPY WITH NOVASURE N/A 09/08/2020   Procedure: HYSTEROSCOPY WITH NOVASURE;  Surgeon: HLavonia Drafts MD;  [place] [facility]  Service: Gynecology;  Laterality: N/A;   MINOR CARPAL TUNNEL     pinched nerve in elbow   WISDOM TOOTH EXTRACTION      Family History  Problem Relation Age of Onset   Hypertension Mother        Living   Arthritis Mother    Thyroid disease Mother    Heart disease Mother        MI at age 50  Heart attack Mother    Migraines Mother    Depression Mother    Obesity Mother    Congestive Heart Failure Father        ?CHF   Hypertension Maternal Grandmother    Parkinson's disease Maternal Grandmother    Alzheimer's disease Maternal Grandmother    Cancer Maternal Grandfather    Hypertension Maternal Grandfather    Hypertension Paternal Grandmother  Hypertension Paternal Grandfather    Gout Brother    ADD / ADHD Son        x1   Other Son        #2-Unknown   Diabetes Neg Hx     Social History   Socioeconomic History   Marital status: Single    Spouse name: Not on file   Number of children: 2   Years of education: Not on file   Highest education level: Associate degree: occupational, Hotel manager, or vocational program  Occupational History   Occupation: Loss adjuster, chartered    Comment: [employer]  Tobacco Use   Smoking status: Former    Types: Cigarettes    Quit date: 1992    Years since quitting: 31.5   Smokeless tobacco: Never   Tobacco comments:    quit 1992  Vaping Use   Vaping Use: Never used  Substance and Sexual Activity   Alcohol use: No    Alcohol/week: 0.0 standard drinks of alcohol   Drug use: No   Sexual activity: Yes    Partners: Male    Birth  control/protection: None  Other Topics Concern   Not on file  Social History Narrative   She has 2 children (grown). She did not retain custody of these children.   Lives with boyfriend in [place].   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 20 tablet 0   ALPRAZolam (XANAX) 0.25 MG tablet TAKE 1 TABLET BY MOUTH TWICE DAILY AS NEEDED FOR ANXIETY AND FOR SLEEP 30 tablet 0   amLODipine (NORVASC) 5 MG tablet Take 1 tablet by mouth once daily 90 tablet 0   azelastine (ASTELIN) 0.1 % nasal spray Place 1 spray into both nostrils 2 (two) times daily. Use in each nostril as directed (Patient taking differently: Place 1 spray into both nostrils 2 (two) times daily as needed for allergies. Use in each nostril as directed) 30 mL 5   Cholecalciferol (VITAMIN D3) 50 MCG (2000 UT) capsule Take 1 capsule (2,000 Units total) by mouth daily.     docusate sodium (COLACE) 100 MG capsule Take 100 mg by mouth 2 (two) times daily as needed for mild constipation.     fluticasone (FLONASE) 50 MCG/ACT nasal spray Place 2 sprays into both nostrils daily. (Patient taking differently: Place 2 sprays into both nostrils daily as needed for allergies.) 16 g 6   furosemide (LASIX) 20 MG tablet Take 1 tablet (20 mg total) by mouth daily. 30 tablet 11   hydrOXYzine (VISTARIL) 25 MG capsule TAKE 1 CAPSULE BY MOUTH AT BEDTIME AS NEEDED FOR SLEEP 90 capsule 0   levothyroxine (SYNTHROID) 150 MCG tablet Take 1 tablet (150 mcg total) by  mouth daily. 90 tablet 1   methocarbamol (ROBAXIN) 500 MG tablet TAKE 1 TABLET BY MOUTH EVERY 8 HOURS AS NEEDED FOR MUSCLE SPASM 20 tablet 0   metoprolol tartrate (LOPRESSOR) 100 MG tablet TAKE 2 HOURS PRIOR TO CT 1 tablet 0  montelukast (SINGULAIR) 10 MG tablet TAKE 1 TABLET BY MOUTH AT BEDTIME 90 tablet 0   Multiple Vitamins-Minerals (MULTIVITAMIN WITH MINERALS) tablet Take 1 tablet by mouth daily.     nystatin (MYCOSTATIN/NYSTOP) powder Apply 1 application topically 3 (three) times daily. (Patient taking differently: Apply 1 application  topically 3 (three) times daily as needed (skin irritation (yeast)).) 60 g 1   ondansetron (ZOFRAN) 4 MG tablet Take 1 tablet (4 mg total) by mouth every 6 (six) hours as needed for nausea. 20 tablet 0   pantoprazole (PROTONIX) 40 MG tablet Take 1 tablet by mouth twice daily 180 tablet 1   Polyvinyl Alcohol-Povidone PF (REFRESH) 1.4-0.6 % SOLN Place 1-2 drops into both eyes 3 (three) times daily as needed (dry/irritated eyes).     potassium chloride (KLOR-CON) 10 MEQ tablet Take 1 tablet (10 mEq total) by mouth daily. 30 tablet 11   rizatriptan (MAXALT) 10 MG tablet As needed for headache; may repeat in 2 hours if needed; max 2 per day or 8 per month 8 tablet 6   senna-docusate (SENOKOT-S) 8.6-50 MG tablet Take 1 tablet by mouth at bedtime as needed for mild constipation. 30 tablet 0   sertraline (ZOLOFT) 100 MG tablet Take 1 tablet (100 mg total) by mouth daily. 135 tablet 1   topiramate (TOPAMAX) 50 MG tablet Take 1 tablet (50 mg total) by mouth 2 (two) times daily. 180 tablet 3   traZODone (DESYREL) 50 MG tablet Take 1 tablet (50 mg total) by mouth at bedtime. 30 tablet 2   valsartan (DIOVAN) 320 MG tablet Take 1 tablet (320 mg total) by mouth daily. 90 tablet 3   Vitamin D, Ergocalciferol, (DRISDOL) 1.25 MG (50000 UNIT) CAPS capsule Take 1 capsule (50,000 Units total) by mouth every 7 (seven) days. 12 capsule 0   XARELTO 20 MG TABS tablet TAKE 1 TABLET BY  MOUTH ONCE DAILY WITH SUPPER 30 tablet 0   hydrochlorothiazide (MICROZIDE) 12.5 MG capsule Take 1 capsule (12.5 mg total) by mouth daily. 90 capsule 3   No facility-administered medications prior to visit.    Allergies  Allergen Reactions   Megace [Megestrol] Other (See Comments)    Pt is s/p PE and DVT on low dose Megace   Adhesive [Tape] Other (See Comments)    Skin Burn.   Biofreeze [Menthol (Topical Analgesic)] Other (See Comments)    Skin Burn.   Camphor Other (See Comments)    Burning sensation.   Folic Acid Hives   Lisinopril Cough   Menthol Other (See Comments)    Burning sensation.   Sulfa Antibiotics Rash    Review of Systems  Skin:        (+) Abscess Under Right Armpit       Objective:    Physical Exam Constitutional:      General: She is not in acute distress.    Appearance: Normal appearance. She is not ill-appearing.  HENT:     Head: Normocephalic and atraumatic.     Right Ear: External ear normal.     Left Ear: External ear normal.  Eyes:     Extraocular Movements: Extraocular movements intact.     Pupils: Pupils are equal, round, and reactive to light.  Skin:    General: Skin is warm and dry.     Findings: Abscess present.     Comments: 1 inch wide erythema with very minimal fluctuance noted    Neurological:     Mental Status: She is alert and oriented  to person, place, and time.  Psychiatric:        Mood and Affect: Mood normal.        Behavior: Behavior normal.        Judgment: Judgment normal.     BP (!) 128/94   Pulse 84   Temp 98.2 F (36.8 C)   Resp 18   Ht '5\' 2"'$  (1.575 m)   Wt (!) 302 lb (137 kg)   SpO2 100%   BMI 55.24 kg/m  Wt Readings from Last 3 Encounters:  08/04/21 (!) 302 lb (137 kg)  06/28/21 (!) 303 lb 1.9 oz (137.5 kg)  05/26/21 (!) 301 lb (136.5 kg)       Assessment & Plan:   Problem List Items Addressed This Visit       Unprioritized   Hypertension    Dbp mildly elevated. Recommended that she return in 1  week for follow up bp check. She is having some pain today which may be contributing. Continue diovan/amlodipine.       Abscess - Primary    New. Recommended doxycycline bid x 7 days. Bid warm compresses.  Call if increased pain/redness or fever. Follow up in 1 week.       Relevant Medications   doxycycline (VIBRA-TABS) 100 MG tablet    Meds ordered this encounter  Medications   doxycycline (VIBRA-TABS) 100 MG tablet    Sig: Take 1 tablet (100 mg total) by mouth 2 (two) times daily.    Dispense:  14 tablet    Refill:  0    Order Specific Question:   Supervising Provider    Answer:   Penni Homans A [[id number]]    I, [name], NP, personally preformed the services described in this documentation.  All medical record entries made by the scribe were at my direction and in my presence.  I have reviewed the chart and discharge instructions (if applicable) and agree that the record reflects my personal performance and is accurate and complete 08/04/2021   I,Amber Collins,acting as a scribe for [employer], NP.,have documented all relevant documentation on the behalf of [name], NP,as directed by  [name], NP while in the presence of [name], NP.    [name], NP

## 2021-08-04 NOTE — Assessment & Plan Note (Signed)
Dbp mildly elevated. Recommended that she return in 1 week for follow up bp check. She is having some pain today which may be contributing. Continue diovan/amlodipine.

## 2021-08-04 NOTE — Assessment & Plan Note (Signed)
New. Recommended doxycycline bid x 7 days. Bid warm compresses.  Call if increased pain/redness or fever. Follow up in 1 week.

## 2021-08-06 ENCOUNTER — Telehealth: Payer: Self-pay | Admitting: Family

## 2021-08-06 ENCOUNTER — Other Ambulatory Visit (INDEPENDENT_AMBULATORY_CARE_PROVIDER_SITE_OTHER): Payer: 59

## 2021-08-06 DIAGNOSIS — E559 Vitamin D deficiency, unspecified: Secondary | ICD-10-CM

## 2021-08-06 DIAGNOSIS — E039 Hypothyroidism, unspecified: Secondary | ICD-10-CM | POA: Diagnosis not present

## 2021-08-06 LAB — TSH: TSH: 7.17 u[IU]/mL — ABNORMAL HIGH (ref 0.35–5.50)

## 2021-08-06 LAB — VITAMIN D 25 HYDROXY (VIT D DEFICIENCY, FRACTURES): VITD: 59.04 ng/mL (ref 30.00–100.00)

## 2021-08-06 NOTE — Telephone Encounter (Signed)
Patient advised of results and provider's comments 

## 2021-08-06 NOTE — Telephone Encounter (Signed)
Vitamin D is now normal. Please continue vitamin D 2000 iu once daily.

## 2021-08-13 ENCOUNTER — Ambulatory Visit: Payer: 59 | Admitting: Family

## 2021-08-25 ENCOUNTER — Other Ambulatory Visit: Payer: Self-pay | Admitting: Family

## 2021-08-26 ENCOUNTER — Other Ambulatory Visit: Payer: Self-pay | Admitting: Family

## 2021-08-26 NOTE — Telephone Encounter (Signed)
Requesting:xanax 0.25 mg Contract:07/22/19 UDS:04/21/20 Last Visit:08/04/20 Next Visit:unknown Last Refill:07/20/21  Please Advise

## 2021-08-26 NOTE — Telephone Encounter (Signed)
Refill sent, however pt is overdue to sign contract/provide UDS.  Please schedule lab visit to complete these two things.

## 2021-08-31 NOTE — Telephone Encounter (Signed)
Patient scheduled to come in 8-03 for uds and to signed contract. Contract at front in folder

## 2021-09-10 ENCOUNTER — Other Ambulatory Visit: Payer: 59

## 2021-09-15 ENCOUNTER — Encounter (INDEPENDENT_AMBULATORY_CARE_PROVIDER_SITE_OTHER): Payer: Self-pay

## 2021-09-17 ENCOUNTER — Other Ambulatory Visit: Payer: Self-pay | Admitting: Family

## 2021-09-20 ENCOUNTER — Encounter: Payer: Self-pay | Admitting: Family

## 2021-09-21 ENCOUNTER — Other Ambulatory Visit: Payer: Self-pay

## 2021-09-21 DIAGNOSIS — G4700 Insomnia, unspecified: Secondary | ICD-10-CM

## 2021-09-21 NOTE — Telephone Encounter (Signed)
Patient scheduled for UDS 09/30/21 contract at folder in front office

## 2021-09-23 ENCOUNTER — Telehealth: Payer: Self-pay

## 2021-09-23 NOTE — Telephone Encounter (Signed)
PA initiated via Covermymeds; KEY: [id number]

## 2021-09-23 NOTE — Telephone Encounter (Signed)
PA approved.   [id number];ZTAEWY:[id number];Review Type:Prior Auth;Coverage Start Date:09/23/2021;Coverage End Date:09/23/2022;

## 2021-09-28 ENCOUNTER — Emergency Department (HOSPITAL_BASED_OUTPATIENT_CLINIC_OR_DEPARTMENT_OTHER): Payer: Commercial Managed Care - HMO

## 2021-09-28 ENCOUNTER — Emergency Department (HOSPITAL_BASED_OUTPATIENT_CLINIC_OR_DEPARTMENT_OTHER)
Admission: EM | Admit: 2021-09-28 | Discharge: 2021-09-28 | Disposition: A | Discharge status: Home / Self Care | Payer: Commercial Managed Care - HMO | Attending: Emergency Medicine | Admitting: Emergency Medicine

## 2021-09-28 ENCOUNTER — Encounter (HOSPITAL_BASED_OUTPATIENT_CLINIC_OR_DEPARTMENT_OTHER): Payer: Self-pay | Admitting: Emergency Medicine

## 2021-09-28 DIAGNOSIS — Z7901 Long term (current) use of anticoagulants: Secondary | ICD-10-CM | POA: Insufficient documentation

## 2021-09-28 DIAGNOSIS — I10 Essential (primary) hypertension: Secondary | ICD-10-CM | POA: Insufficient documentation

## 2021-09-28 DIAGNOSIS — Z87891 Personal history of nicotine dependence: Secondary | ICD-10-CM | POA: Diagnosis not present

## 2021-09-28 DIAGNOSIS — R197 Diarrhea, unspecified: Secondary | ICD-10-CM | POA: Diagnosis not present

## 2021-09-28 DIAGNOSIS — E039 Hypothyroidism, unspecified: Secondary | ICD-10-CM | POA: Insufficient documentation

## 2021-09-28 DIAGNOSIS — Z79899 Other long term (current) drug therapy: Secondary | ICD-10-CM | POA: Diagnosis not present

## 2021-09-28 DIAGNOSIS — R110 Nausea: Secondary | ICD-10-CM | POA: Insufficient documentation

## 2021-09-28 DIAGNOSIS — R1084 Generalized abdominal pain: Secondary | ICD-10-CM | POA: Diagnosis not present

## 2021-09-28 LAB — CBC WITH DIFFERENTIAL/PLATELET
Abs Immature Granulocytes: 0.09 10*3/uL — ABNORMAL HIGH (ref 0.00–0.07)
Basophils Absolute: 0 10*3/uL (ref 0.0–0.1)
Basophils Relative: 0 %
Eosinophils Absolute: 0.3 10*3/uL (ref 0.0–0.5)
Eosinophils Relative: 4 %
HCT: 35.3 % — ABNORMAL LOW (ref 36.0–46.0)
Hemoglobin: 12.1 g/dL (ref 12.0–15.0)
Immature Granulocytes: 1 %
Lymphocytes Relative: 14 %
Lymphs Abs: 1.1 10*3/uL (ref 0.7–4.0)
MCH: 29.3 pg (ref 26.0–34.0)
MCHC: 34.3 g/dL (ref 30.0–36.0)
MCV: 85.5 fL (ref 80.0–100.0)
Monocytes Absolute: 0.5 10*3/uL (ref 0.1–1.0)
Monocytes Relative: 7 %
Neutro Abs: 6 10*3/uL (ref 1.7–7.7)
Neutrophils Relative %: 74 %
Platelets: 316 10*3/uL (ref 150–400)
RBC: 4.13 MIL/uL (ref 3.87–5.11)
RDW: 13.8 % (ref 11.5–15.5)
WBC: 8.1 10*3/uL (ref 4.0–10.5)
nRBC: 0 % (ref 0.0–0.2)

## 2021-09-28 LAB — COMPREHENSIVE METABOLIC PANEL
ALT: 22 U/L (ref 0–44)
AST: 20 U/L (ref 15–41)
Albumin: 3.6 g/dL (ref 3.5–5.0)
Alkaline Phosphatase: 62 U/L (ref 38–126)
Anion gap: 6 (ref 5–15)
BUN: 12 mg/dL (ref 6–20)
CO2: 25 mmol/L (ref 22–32)
Calcium: 8.6 mg/dL — ABNORMAL LOW (ref 8.9–10.3)
Chloride: 104 mmol/L (ref 98–111)
Creatinine, Ser: 1.04 mg/dL — ABNORMAL HIGH (ref 0.44–1.00)
GFR, Estimated: >60 mL/min (ref >60)
Glucose, Bld: 116 mg/dL — ABNORMAL HIGH (ref 70–99)
Potassium: 3.6 mmol/L (ref 3.5–5.1)
Sodium: 135 mmol/L (ref 135–145)
Total Bilirubin: 0.6 mg/dL (ref 0.3–1.2)
Total Protein: 7.6 g/dL (ref 6.5–8.1)

## 2021-09-28 LAB — LIPASE, BLOOD: Lipase: 29 U/L (ref 11–51)

## 2021-09-28 MED ORDER — ONDANSETRON HCL 4 MG/2ML IJ SOLN
4.0000 mg | Freq: Once | INTRAMUSCULAR | Status: AC
  Administered 2021-09-28: 4 mg via INTRAVENOUS
  Filled 2021-09-28: qty 2

## 2021-09-28 MED ORDER — ONDANSETRON 4 MG PO TBDP: 4.0000 mg | ORAL_TABLET | Freq: Three times a day (TID) | ORAL | 1 refills | Status: DC | PRN

## 2021-09-28 MED ORDER — SODIUM CHLORIDE 0.9 % IV SOLN: INTRAVENOUS | Status: DC

## 2021-09-28 MED ORDER — IOHEXOL 300 MG/ML  SOLN
100.0000 mL | Freq: Once | INTRAMUSCULAR | Status: AC | PRN
  Administered 2021-09-28: 100 mL via INTRAVENOUS

## 2021-09-28 MED ORDER — SODIUM CHLORIDE 0.9 % IV BOLUS
1000.0000 mL | Freq: Once | INTRAVENOUS | Status: AC
  Administered 2021-09-28: 1000 mL via INTRAVENOUS

## 2021-09-28 MED ORDER — LOPERAMIDE HCL 2 MG PO TABS: 2.0000 mg | ORAL_TABLET | Freq: Four times a day (QID) | ORAL | 0 refills | Status: DC | PRN

## 2021-09-28 NOTE — ED Triage Notes (Signed)
Pt states on Friday she felt tired, Saturday she felt better, Sunday she started having cramping and diarrhea  Pt states whatever she put in her mouth came out the other end  Pt states she has not slept since Sunday morning  Pt states the pain is radiating around to her back now as well

## 2021-09-28 NOTE — ED Provider Notes (Signed)
[facility] Provider Note   CSN: [id number] Arrival date & time: 09/28/21  6812     History  Chief Complaint  Patient presents with   Abdominal Pain    Sharon Rivers is a 51 y.o. female.  Patient with cute onset of generalized abdominal pain with nausea and diarrhea started on Sunday.  No blood in the bowel movements.  No true vomiting.  No known sick exposure.  Past medical history is significant for patient being on Xarelto.  Also hypertension gastroesophageal reflux disease panic anxiety syndrome depression hypothyroidism obstructive sleep apnea history of DVT deep vein thrombosis in 2018 pulmonary embolism in 2022 which explains the Xarelto.  Past surgical history C-sections and D&C in 2022 former smoker quit in 1992.       Home Medications Prior to Admission medications   Medication Sig Start Date End Date Taking? Authorizing Provider  loperamide (IMODIUM A-D) 2 MG tablet Take 1 tablet (2 mg total) by mouth 4 (four) times daily as needed for diarrhea or loose stools. 09/28/21  Yes Fredia Sorrow, MD  ondansetron (ZOFRAN-ODT) 4 MG disintegrating tablet Take 1 tablet (4 mg total) by mouth every 8 (eight) hours as needed. 09/28/21  Yes Fredia Sorrow, MD  acetaminophen (TYLENOL) 325 MG tablet Take 2 tablets (650 mg total) by mouth every 6 (six) hours as needed for mild pain (or Fever >/= 101). 05/17/20   Sheikh, Omair Latif, DO  ALPRAZolam Duanne Moron) 0.25 MG tablet TAKE 1 TABLET BY MOUTH TWICE DAILY AS NEEDED FOR ANXIETY AND FOR SLEEP 08/26/21   [name], NP  amLODipine (NORVASC) 5 MG tablet Take 1 tablet by mouth once daily 07/26/21   [name], NP  azelastine (ASTELIN) 0.1 % nasal spray Place 1 spray into both nostrils 2 (two) times daily. Use in each nostril as directed Patient taking differently: Place 1 spray into both nostrils 2 (two) times daily as needed for allergies. Use in each nostril as directed 04/14/20   [name], NP  Cholecalciferol (VITAMIN D3) 50 MCG (2000 UT) capsule Take 1 capsule (2,000 Units total) by mouth daily. 04/30/21   [name], NP  docusate sodium (COLACE) 100 MG capsule Take 100 mg by mouth 2 (two) times daily as needed for mild constipation.    [provider]  doxycycline (VIBRA-TABS) 100 MG tablet Take 1 tablet (100 mg total) by mouth 2 (two) times daily. 08/04/21   [name], NP  fluticasone (FLONASE) 50 MCG/ACT nasal spray Place 2 sprays into both nostrils daily. Patient taking differently: Place 2 sprays into both nostrils daily as needed for allergies. 10/21/19   Ann Held, DO  furosemide (LASIX) 20 MG tablet Take 1 tablet (20 mg total) by mouth daily. 07/06/21 07/01/22  Tobb, Kardie, DO  hydrochlorothiazide (MICROZIDE) 12.5 MG capsule Take 1 capsule (12.5 mg total) by mouth daily. 01/05/21 04/05/21  Tobb, Kardie, DO  hydrOXYzine (VISTARIL) 25 MG capsule TAKE 1 CAPSULE BY MOUTH AT BEDTIME AS NEEDED FOR SLEEP 07/19/21   [name], NP  levothyroxine (SYNTHROID) 150 MCG tablet Take 1 tablet (150 mcg total) by mouth daily. 06/22/21   [name], NP  methocarbamol (ROBAXIN) 500 MG tablet TAKE 1 TABLET BY MOUTH EVERY 8 HOURS AS NEEDED FOR MUSCLE SPASM 07/19/21   [name], NP  metoprolol tartrate (LOPRESSOR) 100 MG tablet TAKE 2 HOURS PRIOR TO CT 07/20/21   Tobb, Kardie, DO  montelukast (SINGULAIR) 10 MG tablet TAKE 1 TABLET BY MOUTH AT BEDTIME 07/19/21  [name], NP  Multiple Vitamins-Minerals (MULTIVITAMIN WITH MINERALS) tablet Take 1 tablet by mouth daily. 04/30/21   [name], NP  nystatin (MYCOSTATIN/NYSTOP) powder Apply 1 application topically 3 (three) times daily. Patient taking differently: Apply 1 application  topically 3 (three) times daily as needed (skin irritation (yeast)). 08/14/20   [name], NP  ondansetron (ZOFRAN) 4 MG tablet Take 1 tablet (4 mg total) by mouth every 6 (six)  hours as needed for nausea. 05/17/20   Raiford Noble Latif, DO  pantoprazole (PROTONIX) 40 MG tablet Take 1 tablet by mouth twice daily 07/19/21   [name], NP  Polyvinyl Alcohol-Povidone PF (REFRESH) 1.4-0.6 % SOLN Place 1-2 drops into both eyes 3 (three) times daily as needed (dry/irritated eyes).    [provider]  potassium chloride (KLOR-CON) 10 MEQ tablet Take 1 tablet (10 mEq total) by mouth daily. 07/06/21 07/01/22  Tobb, Godfrey Pick, DO  rizatriptan (MAXALT) 10 MG tablet As needed for headache; may repeat in 2 hours if needed; max 2 per day or 8 per month 01/14/21   Lomax, Amy, NP  senna-docusate (SENOKOT-S) 8.6-50 MG tablet Take 1 tablet by mouth at bedtime as needed for mild constipation. 05/17/20   Raiford Noble Latif, DO  sertraline (ZOLOFT) 100 MG tablet Take 1 tablet (100 mg total) by mouth daily. 07/20/21   [name], NP  topiramate (TOPAMAX) 50 MG tablet Take 1 tablet (50 mg total) by mouth 2 (two) times daily. 01/14/21   Lomax, Amy, NP  traZODone (DESYREL) 50 MG tablet Take 1 tablet (50 mg total) by mouth at bedtime. 07/20/21   [name], NP  valsartan (DIOVAN) 320 MG tablet Take 1 tablet (320 mg total) by mouth daily. 10/13/20   Tobb, Kardie, DO  Vitamin D, Ergocalciferol, (DRISDOL) 1.25 MG (50000 UNIT) CAPS capsule Take 1 capsule (50,000 Units total) by mouth every 7 (seven) days. 05/05/21   [name], NP  XARELTO 20 MG TABS tablet TAKE 1 TABLET BY MOUTH ONCE DAILY WITH SUPPER 09/18/21   [name], NP      Allergies    Megace [megestrol], Adhesive [tape], Biofreeze [menthol (topical analgesic)], Camphor, Folic acid, Lisinopril, Menthol, and Sulfa antibiotics    Review of Systems   Review of Systems  Constitutional:  Negative for chills and fever.  HENT:  Negative for ear pain and sore throat.   Eyes:  Negative for pain and visual disturbance.  Respiratory:  Negative for cough and shortness of breath.   Cardiovascular:  Negative for  chest pain and palpitations.  Gastrointestinal:  Positive for abdominal pain, diarrhea and nausea. Negative for vomiting.  Genitourinary:  Negative for dysuria and hematuria.  Musculoskeletal:  Negative for arthralgias and back pain.  Skin:  Negative for color change and rash.  Neurological:  Negative for seizures and syncope.  All other systems reviewed and are negative.   Physical Exam Updated Vital Signs BP 124/84   Pulse 68   Temp 98.7 F (37.1 C) (Oral)   Resp 17   Ht 1.575 m ('5\' 2"'$ )   Wt (!) 137.4 kg   SpO2 100%   BMI 55.42 kg/m  Physical Exam Vitals and nursing note reviewed.  Constitutional:      General: She is not in acute distress.    Appearance: She is well-developed.  HENT:     Head: Normocephalic and atraumatic.     Mouth/Throat:     Mouth: Mucous membranes are moist.  Eyes:     General: No scleral icterus.  Conjunctiva/sclera: Conjunctivae normal.  Cardiovascular:     Rate and Rhythm: Normal rate and regular rhythm.     Heart sounds: No murmur heard. Pulmonary:     Effort: Pulmonary effort is normal. No respiratory distress.     Breath sounds: Normal breath sounds.  Abdominal:     General: Abdomen is protuberant.     Palpations: Abdomen is soft.     Tenderness: There is generalized abdominal tenderness. There is no guarding.     Hernia: No hernia is present.  Musculoskeletal:        General: No swelling.     Cervical back: Neck supple.  Skin:    General: Skin is warm and dry.     Capillary Refill: Capillary refill takes less than 2 seconds.  Neurological:     General: No focal deficit present.     Mental Status: She is alert and oriented to person, place, and time.  Psychiatric:        Mood and Affect: Mood normal.     ED Results / Procedures / Treatments   Labs (all labs ordered are listed, but only abnormal results are displayed) Labs Reviewed  CBC WITH DIFFERENTIAL/PLATELET - Abnormal; Notable for the following components:      Result  Value   HCT 35.3 (*)    Abs Immature Granulocytes 0.09 (*)    All other components within normal limits  COMPREHENSIVE METABOLIC PANEL - Abnormal; Notable for the following components:   Glucose, Bld 116 (*)    Creatinine, Ser 1.04 (*)    Calcium 8.6 (*)    All other components within normal limits  LIPASE, BLOOD    EKG None  Radiology CT Abdomen Pelvis W Contrast  Result Date: 09/28/2021 CLINICAL DATA:  Acute generalized abdominal pain. EXAM: CT ABDOMEN AND PELVIS WITH CONTRAST TECHNIQUE: Multidetector CT imaging of the abdomen and pelvis was performed using the standard protocol following bolus administration of intravenous contrast. RADIATION DOSE REDUCTION: This exam was performed according to the departmental dose-optimization program which includes automated exposure control, adjustment of the mA and/or kV according to patient size and/or use of iterative reconstruction technique. CONTRAST:  161m OMNIPAQUE IOHEXOL 300 MG/ML  SOLN COMPARISON:  May 12, 2020. FINDINGS: Lower chest: No acute abnormality. Hepatobiliary: Cholelithiasis is noted without inflammation. No biliary dilatation is noted. Hepatic steatosis is noted. Pancreas: Unremarkable. No pancreatic ductal dilatation or surrounding inflammatory changes. Spleen: Normal in size without focal abnormality. Adrenals/Urinary Tract: Adrenal glands are unremarkable. Kidneys are normal, without renal calculi, focal lesion, or hydronephrosis. Bladder is unremarkable. Stomach/Bowel: Stomach is within normal limits. Appendix appears normal. No evidence of bowel wall thickening, distention, or inflammatory changes. Vascular/Lymphatic: No significant vascular findings are present. No enlarged abdominal or pelvic lymph nodes. Reproductive: Uterus and bilateral adnexa are unremarkable. Other: No abdominal wall hernia or abnormality. No abdominopelvic ascites. Musculoskeletal: No acute or significant osseous findings. IMPRESSION: Hepatic steatosis.  Cholelithiasis without definite evidence of inflammation. No acute abnormality seen in the abdomen or pelvis. Electronically Signed   By: JMarijo ConceptionM.D.   On: 09/28/2021 08:56    Procedures Procedures    Medications Ordered in ED Medications  0.9 %  sodium chloride infusion (has no administration in time range)  sodium chloride 0.9 % bolus 1,000 mL (0 mLs Intravenous Stopped 09/28/21 0919)  ondansetron (ZOFRAN) injection 4 mg (4 mg Intravenous Given 09/28/21 0759)  iohexol (OMNIPAQUE) 300 MG/ML solution 100 mL (100 mLs Intravenous Contrast Given 09/28/21 0840)  ED Course/ Medical Decision Making/ A&P                           Medical Decision Making Amount and/or Complexity of Data Reviewed Labs: ordered. Radiology: ordered.  Risk OTC drugs. Prescription drug management.   Patient feeling better after IV hydration and pain medicine.  CBC no leukocytosis hemoglobin normal complete metabolic panel normal renal function liver function test are good lipase is normal.  CT abdomen only significant for cholelithiasis but there appears to be no acute changes.  I do not think this is playing a role with her abdominal pain because its more diffuse and there is diarrhea associated with it suspect this is a viral gastroenteritis.  We will treat with Imodium A-D and Zofran work note provided.  Patient will follow-up with her doctors.  Return for any new or worse symptoms. Final Clinical Impression(s) / ED Diagnoses Final diagnoses:  Generalized abdominal pain    Rx / DC Orders ED Discharge Orders          Ordered    ondansetron (ZOFRAN-ODT) 4 MG disintegrating tablet  Every 8 hours PRN        09/28/21 0919    loperamide (IMODIUM A-D) 2 MG tablet  4 times daily PRN        09/28/21 0919              Fredia Sorrow, MD 09/28/21 [phone]

## 2021-09-28 NOTE — Discharge Instructions (Signed)
CT scan without any acute findings.  Did show incidental finding of gallstones.  But I do not think that they are playing a role with today's presentation.  Most likely stomach bug.  Take the Imodium A-D as needed for diarrhea.  Take the Zofran as needed for nausea.  Work note provided.

## 2021-09-30 ENCOUNTER — Other Ambulatory Visit: Payer: 59

## 2021-10-02 ENCOUNTER — Encounter: Payer: Self-pay | Admitting: Family

## 2021-10-03 NOTE — Telephone Encounter (Signed)
Sharon Rivers,   Can we please look into patient assistance for her xarelto? If she needs samples and we have them can you please give them to her until she can get in for an appointment?  [name]

## 2021-10-04 ENCOUNTER — Telehealth: Payer: Self-pay | Admitting: Pharmacist

## 2021-10-04 ENCOUNTER — Other Ambulatory Visit: Payer: Self-pay | Admitting: Family

## 2021-10-04 ENCOUNTER — Ambulatory Visit: Payer: Commercial Managed Care - HMO | Admitting: Pharmacist

## 2021-10-04 DIAGNOSIS — Z7901 Long term (current) use of anticoagulants: Secondary | ICD-10-CM

## 2021-10-04 DIAGNOSIS — Z86711 Personal history of pulmonary embolism: Secondary | ICD-10-CM

## 2021-10-04 MED ORDER — RIVAROXABAN 20 MG PO TABS
20.0000 mg | ORAL_TABLET | Freq: Every day | ORAL | 5 refills | Status: DC
Start: 2021-10-04 — End: 2022-05-01

## 2021-10-04 NOTE — Progress Notes (Signed)
See telephone visit note

## 2021-10-04 NOTE — Chronic Care Management (AMB) (Signed)
Social Work Note  10/04/2021 Name: Sharon Rivers MRN: [id number] DOB: 1970/11/28  Sharon Rivers is a 51 y.o. year old female who is a primary care patient of [name], NP.  The Care Management team was consulted for assistance with medication cost.   Patient agreed to services   Engaged with patient by telephone for initial visit in response to provider referral for social work chronic care management and care coordination services.  Assessment: Review of patient past medical history, allergies, medications, and health status, including review of pertinent consultant reports was performed as part of comprehensive evaluation and provision of care management/care coordination services.   Care Plan  Allergies  Allergen Reactions   Megace [Megestrol] Other (See Comments)    Pt is s/p PE and DVT on low dose Megace   Adhesive [Tape] Other (See Comments)    Skin Burn.   Biofreeze [Menthol (Topical Analgesic)] Other (See Comments)    Skin Burn.   Camphor Other (See Comments)    Burning sensation.   Folic Acid Hives   Lisinopril Cough   Menthol Other (See Comments)    Burning sensation.   Sulfa Antibiotics Rash    Outpatient Encounter Medications as of 10/04/2021  Medication Sig Note   acetaminophen (TYLENOL) 325 MG tablet Take 2 tablets (650 mg total) by mouth every 6 (six) hours as needed for mild pain (or Fever >/= 101).    ALPRAZolam (XANAX) 0.25 MG tablet TAKE 1 TABLET BY MOUTH TWICE DAILY AS NEEDED FOR ANXIETY AND FOR SLEEP    amLODipine (NORVASC) 5 MG tablet Take 1 tablet by mouth once daily    azelastine (ASTELIN) 0.1 % nasal spray Place 1 spray into both nostrils 2 (two) times daily. Use in each nostril as directed (Patient taking differently: Place 1 spray into both nostrils 2 (two) times daily as needed for allergies. Use in each nostril as directed)    Cholecalciferol (VITAMIN D3) 50 MCG (2000 UT) capsule Take 1 capsule (2,000 Units total) by mouth daily.     docusate sodium (COLACE) 100 MG capsule Take 100 mg by mouth 2 (two) times daily as needed for mild constipation.    doxycycline (VIBRA-TABS) 100 MG tablet Take 1 tablet (100 mg total) by mouth 2 (two) times daily.    fluticasone (FLONASE) 50 MCG/ACT nasal spray Place 2 sprays into both nostrils daily. (Patient taking differently: Place 2 sprays into both nostrils daily as needed for allergies.)    furosemide (LASIX) 20 MG tablet Take 1 tablet (20 mg total) by mouth daily.    hydrochlorothiazide (MICROZIDE) 12.5 MG capsule Take 1 capsule (12.5 mg total) by mouth daily.    hydrOXYzine (VISTARIL) 25 MG capsule TAKE 1 CAPSULE BY MOUTH AT BEDTIME AS NEEDED FOR SLEEP    levothyroxine (SYNTHROID) 150 MCG tablet Take 1 tablet (150 mcg total) by mouth daily.    loperamide (IMODIUM A-D) 2 MG tablet Take 1 tablet (2 mg total) by mouth 4 (four) times daily as needed for diarrhea or loose stools.    methocarbamol (ROBAXIN) 500 MG tablet TAKE 1 TABLET BY MOUTH EVERY 8 HOURS AS NEEDED FOR MUSCLE SPASM    metoprolol tartrate (LOPRESSOR) 100 MG tablet TAKE 2 HOURS PRIOR TO CT    montelukast (SINGULAIR) 10 MG tablet TAKE 1 TABLET BY MOUTH AT BEDTIME    Multiple Vitamins-Minerals (MULTIVITAMIN WITH MINERALS) tablet Take 1 tablet by mouth daily.    nystatin (MYCOSTATIN/NYSTOP) powder Apply 1 application topically 3 (three) times daily. (Patient taking  differently: Apply 1 application  topically 3 (three) times daily as needed (skin irritation (yeast)).)    ondansetron (ZOFRAN) 4 MG tablet Take 1 tablet (4 mg total) by mouth every 6 (six) hours as needed for nausea. 09/02/2020: On hand   ondansetron (ZOFRAN-ODT) 4 MG disintegrating tablet Take 1 tablet (4 mg total) by mouth every 8 (eight) hours as needed.    pantoprazole (PROTONIX) 40 MG tablet Take 1 tablet by mouth twice daily    Polyvinyl Alcohol-Povidone PF (REFRESH) 1.4-0.6 % SOLN Place 1-2 drops into both eyes 3 (three) times daily as needed (dry/irritated eyes).     potassium chloride (KLOR-CON) 10 MEQ tablet Take 1 tablet (10 mEq total) by mouth daily.    rivaroxaban (XARELTO) 20 MG TABS tablet Take 1 tablet (20 mg total) by mouth daily with supper.    rizatriptan (MAXALT) 10 MG tablet As needed for headache; may repeat in 2 hours if needed; max 2 per day or 8 per month    senna-docusate (SENOKOT-S) 8.6-50 MG tablet Take 1 tablet by mouth at bedtime as needed for mild constipation.    sertraline (ZOLOFT) 100 MG tablet Take 1 tablet (100 mg total) by mouth daily.    topiramate (TOPAMAX) 50 MG tablet Take 1 tablet (50 mg total) by mouth 2 (two) times daily.    traZODone (DESYREL) 50 MG tablet Take 1 tablet (50 mg total) by mouth at bedtime.    valsartan (DIOVAN) 320 MG tablet Take 1 tablet (320 mg total) by mouth daily.    Vitamin D, Ergocalciferol, (DRISDOL) 1.25 MG (50000 UNIT) CAPS capsule Take 1 capsule (50,000 Units total) by mouth every 7 (seven) days.    [DISCONTINUED] XARELTO 20 MG TABS tablet TAKE 1 TABLET BY MOUTH ONCE DAILY WITH SUPPER    No facility-administered encounter medications on file as of 10/04/2021.    Patient Active Problem List   Diagnosis Date Noted   Abscess 08/04/2021   Cold sore 12/29/2020   Cutaneous abscess of abdominal wall 12/04/2020   Preventative health care 10/30/2020   Allergic rhinitis 08/19/2020   Anxiety    Frequent headaches    Osteoarthritis    Panic anxiety syndrome    Trigeminal neuralgia 04/21/2020   Synovitis of toe 04/09/2020   Herniated nucleus pulposus, lumbar 02/13/2020   History of pulmonary embolism 2022   Body mass index (BMI) 50.0-59.9, adult ([place]) 10/01/2019   Metatarsalgia of both feet 02/25/2018   Insulin resistance 06/13/2016   Vitamin D deficiency 04/12/2016   OSA (obstructive sleep apnea) 03/17/2016   Insomnia 02/23/2016   GERD (gastroesophageal reflux disease) 02/23/2016   Migraines 02/23/2016   Depression 02/23/2016   History of DVT (deep vein thrombosis) 2018   Hypertension  07/22/2015   Hypothyroid 07/22/2015   Family history of early CAD 07/22/2015   Bilateral carpal tunnel syndrome 04/30/2015   Chondromalacia patellae 09/12/2013    Assessment / Plan:  Chronic anticoagulation due to recurrent right lower extremity DVT and bilateral PE's. She had an elevated homocystine level and positive lupus anticoagulant.  Medication Management - has Art gallery manager and cost of Xarelto is $500 due to deductible.   Plan:  Contacted pharmacy to check on coverage Registered patient for Sharon Rivers. Provided pharmacy with information. Cost for 30 days of Sharon Rivers was $10. Per [employer], they will cover all of cost except $10 for 90 days. Then card will cover up to a $200 limit for each 30-day supply thereafter. There is a $3,400  maximum program benefit per calendar year She will continue her same regimen with Xarelto and contact Clinical Pharmacist Practitioner is she has any other issues with medication costs.    Follow Up Plan: Client will contact Clinical Pharmacist Practitioner is any additional medication needs arise.       Cherre Robins, PharmD Clinical Pharmacist [facility]

## 2021-10-04 NOTE — Patient Instructions (Signed)
See phone visit note

## 2021-10-12 ENCOUNTER — Ambulatory Visit (INDEPENDENT_AMBULATORY_CARE_PROVIDER_SITE_OTHER): Payer: Commercial Managed Care - HMO | Admitting: Family

## 2021-10-12 VITALS — BP 113/74 | HR 88 | Temp 98.9°F | Resp 16 | Wt 302.0 lb

## 2021-10-12 DIAGNOSIS — M5414 Radiculopathy, thoracic region: Secondary | ICD-10-CM

## 2021-10-12 DIAGNOSIS — Z86718 Personal history of other venous thrombosis and embolism: Secondary | ICD-10-CM

## 2021-10-12 DIAGNOSIS — F3289 Other specified depressive episodes: Secondary | ICD-10-CM

## 2021-10-12 DIAGNOSIS — F419 Anxiety disorder, unspecified: Secondary | ICD-10-CM | POA: Diagnosis not present

## 2021-10-12 DIAGNOSIS — G43809 Other migraine, not intractable, without status migrainosus: Secondary | ICD-10-CM

## 2021-10-12 DIAGNOSIS — E039 Hypothyroidism, unspecified: Secondary | ICD-10-CM

## 2021-10-12 DIAGNOSIS — I10 Essential (primary) hypertension: Secondary | ICD-10-CM | POA: Diagnosis not present

## 2021-10-12 DIAGNOSIS — G4733 Obstructive sleep apnea (adult) (pediatric): Secondary | ICD-10-CM

## 2021-10-12 DIAGNOSIS — Z1211 Encounter for screening for malignant neoplasm of colon: Secondary | ICD-10-CM

## 2021-10-12 DIAGNOSIS — K219 Gastro-esophageal reflux disease without esophagitis: Secondary | ICD-10-CM

## 2021-10-12 DIAGNOSIS — E559 Vitamin D deficiency, unspecified: Secondary | ICD-10-CM

## 2021-10-12 MED ORDER — ALPRAZOLAM 0.25 MG PO TABS: ORAL_TABLET | ORAL | 0 refills | Status: DC

## 2021-10-12 MED ORDER — MONTELUKAST SODIUM 10 MG PO TABS
10.0000 mg | ORAL_TABLET | Freq: Every day | ORAL | 0 refills | Status: DC
Start: 2021-10-12 — End: 2022-05-01

## 2021-10-12 MED ORDER — AMLODIPINE BESYLATE 5 MG PO TABS
5.0000 mg | ORAL_TABLET | Freq: Every day | ORAL | 0 refills | Status: DC
Start: 2021-10-12 — End: 2022-04-12

## 2021-10-12 MED ORDER — TRAZODONE HCL 50 MG PO TABS: 50.0000 mg | ORAL_TABLET | Freq: Every day | ORAL | 1 refills | Status: DC

## 2021-10-12 MED ORDER — VALSARTAN 320 MG PO TABS: 320.0000 mg | ORAL_TABLET | Freq: Every day | ORAL | 1 refills | Status: DC

## 2021-10-12 MED ORDER — METHYLPREDNISOLONE 4 MG PO TBPK: ORAL_TABLET | ORAL | 0 refills | Status: DC

## 2021-10-12 NOTE — Assessment & Plan Note (Signed)
Stable, following with neurology.

## 2021-10-12 NOTE — Assessment & Plan Note (Signed)
Reports mood is stable. Continue sertraline.

## 2021-10-12 NOTE — Assessment & Plan Note (Signed)
BP stable on amlodipine, HCTZ, Losartan and metoprolol.

## 2021-10-12 NOTE — Assessment & Plan Note (Signed)
Continues long term xarelto.

## 2021-10-12 NOTE — Assessment & Plan Note (Signed)
Stable on protonix. Continue same.

## 2021-10-12 NOTE — Assessment & Plan Note (Signed)
Stable on cpap

## 2021-10-12 NOTE — Progress Notes (Signed)
Subjective:   By signing my name below, I, [name], attest that this documentation has been prepared under the direction and in the presence of Sharon Chimera, NP 10/12/2021   Patient ID: Sharon Rivers, female    DOB: 1970/11/26, 51 y.o.   MRN: [id number]  No chief complaint on file.   HPI Patient is in today for an office visit  Refills: She is requesting a refill of 0.25 Xanax, 5 Mg of Amlodipine, and 10 Mg of Singulair.  Back Pain: She complains of right back pain underneath her arm that appeared about a month ago. Her pain radiates around her body. She denies of any injury that could cause the pain. She has tried OTC medications, rubs, episome, etc. but it does not relieve her symptoms.  She does not notice any worsening rashes around the area. She notes that she went to the ED for her symptoms and was reported to have gallstones. The stones are not inflamed. She denies of any unusual abdominal pain.  Blood Pressure: She is currently taking 5 Mg of Amlodipine, 320 Mg of Valsartan, 12.5 Mg of Hydrochlorothiazide, and 100 Mg of Lopressor.  BP Readings from Last 3 Encounters:  10/12/21 113/74  09/28/21 127/76  08/04/21 (!) 128/94   Pulse Readings from Last 3 Encounters:  10/12/21 88  09/28/21 72  08/04/21 84   Mood: Her mood is controlled. She is currently taking 100 Mg of Sertaline. She recently changed jobs which has been improving her mood.  Reflux: She is currently taking 40 Mg of Protonix Sleep: She takes 0.25 Mg of Xanax during the evening to help her sleep. She is also using her Cpap machine which is improving her sleep. Some nights she does not use it because it feels like it is suffocating her.  Migraines: She denies of any recent migraines. She is currently taking 50 Mg of Topamax.  Thyroid: She takes  150 MCG of Synthroid during the mornings.  Vitamin D: She is taking about 2000 Units of Vitamin D3 supplements. She is also taking an OTC Multivitamin.  Nasal  Congestion: She is currently taking 10 Mg of Singulair which relieves her symptoms.  Colonoscopy: She is due for a colonoscopy. She denies of any colon cancer in her family. She is interested in using a Cologuard.   Health Maintenance Due  Topic Date Due   COVID-19 Vaccine (1) Never done   COLONOSCOPY (Pts 45-4yr Insurance coverage will need to be confirmed)  Never done   MAMMOGRAM  08/04/2020    Past Medical History:  Diagnosis Date   Anxiety    Back pain    Chest pain    a. 07/2015 Myoview: EF 71%, medium size, mild intensity, partially reversible septal defect with overlying breast attenuation -> likely artifact. No significant reversible ischemia.   Constipation    Depression    Edema    feet and legs   Frequent headaches    GERD (gastroesophageal reflux disease)    History of DVT (deep vein thrombosis) 2018   Hypertension    Hypothyroidism    Insulin resistance    Joint pain    Migraines    OSA (obstructive sleep apnea) 03/17/2016   Osteoarthritis    Panic anxiety syndrome    PONV (postoperative nausea and vomiting)    Pulmonary embolism ([place] 2022   SOB (shortness of breath)    a. 04/2016 Echo: EF 60-65%, Gr1 DD.   Swallowing difficulty     Past Surgical History:  Procedure Laterality Date   CESAREAN SECTION  2001 & 2002   DILATION AND CURETTAGE OF UTERUS N/A 09/08/2020   Procedure: DILATATION AND CURETTAGE;  Surgeon: Lavonia Drafts, MD;  Location: [facility];  Service: Gynecology;  Laterality: N/A;   ELBOW SURGERY     HYSTEROSCOPY WITH NOVASURE N/A 09/08/2020   Procedure: HYSTEROSCOPY WITH NOVASURE;  Surgeon: Lavonia Drafts, MD;  Location: [facility];  Service: Gynecology;  Laterality: N/A;   MINOR CARPAL TUNNEL     pinched nerve in elbow   WISDOM TOOTH EXTRACTION      Family History  Problem Relation Age of Onset   Hypertension Mother        Living   Arthritis Mother    Thyroid disease Mother    Heart disease Mother        MI at age 16    Heart attack Mother    Migraines Mother    Depression Mother    Obesity Mother    Congestive Heart Failure Father        ?CHF   Hypertension Maternal Grandmother    Parkinson's disease Maternal Grandmother    Alzheimer's disease Maternal Grandmother    Cancer Maternal Grandfather    Hypertension Maternal Grandfather    Hypertension Paternal Grandmother    Hypertension Paternal Grandfather    Gout Brother    ADD / ADHD Son        x1   Other Son        #2-Unknown   Diabetes Neg Hx     Social History   Socioeconomic History   Marital status: Single    Spouse name: Not on file   Number of children: 2   Years of education: Not on file   Highest education level: Associate degree: occupational, Hotel manager, or vocational program  Occupational History   Occupation: Loss adjuster, chartered    Comment: [employer]  Tobacco Use   Smoking status: Former    Types: Cigarettes    Quit date: 1992    Years since quitting: 31.6   Smokeless tobacco: Never   Tobacco comments:    quit 1992  Vaping Use   Vaping Use: Never used  Substance and Sexual Activity   Alcohol use: No    Alcohol/week: 0.0 standard drinks of alcohol   Drug use: No   Sexual activity: Yes    Partners: Male    Birth control/protection: None  Other Topics Concern   Not on file  Social History Narrative   She has 2 children (grown). She did not retain custody of these children.   Lives with boyfriend in [place].   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 20 tablet 0   ALPRAZolam (XANAX) 0.25 MG tablet TAKE 1 TABLET BY MOUTH TWICE DAILY AS NEEDED FOR ANXIETY AND FOR SLEEP 30 tablet 0   amLODipine (NORVASC) 5 MG tablet Take 1 tablet by mouth once daily 90 tablet 0   azelastine (ASTELIN) 0.1 % nasal  spray Place 1 spray into both nostrils 2 (two) times daily. Use in each nostril as directed (Patient taking differently: Place 1 spray into both nostrils 2 (two) times daily as needed for allergies. Use in each nostril as directed) 30 mL 5   Cholecalciferol (VITAMIN D3) 50 MCG (2000 UT) capsule Take 1 capsule (2,000 Units total) by mouth daily.     docusate sodium (COLACE) 100 MG capsule Take 100 mg by mouth 2 (two) times daily as needed for mild constipation.     doxycycline (VIBRA-TABS) 100 MG tablet Take 1 tablet (100 mg total) by mouth 2 (two) times daily. 14 tablet 0   fluticasone (FLONASE) 50 MCG/ACT nasal spray Place 2 sprays into both nostrils daily. (Patient taking differently: Place 2 sprays into both nostrils daily as needed for allergies.) 16 g 6   furosemide (LASIX) 20 MG tablet Take 1 tablet (20 mg total) by mouth daily. 30 tablet 11   hydrochlorothiazide (MICROZIDE) 12.5 MG capsule Take 1 capsule (12.5 mg total) by mouth daily. 90 capsule 3   hydrOXYzine (VISTARIL) 25 MG capsule TAKE 1 CAPSULE BY MOUTH AT BEDTIME AS NEEDED FOR SLEEP 90 capsule 0   levothyroxine (SYNTHROID) 150 MCG tablet Take 1 tablet (150 mcg total) by mouth daily. 90 tablet 1   loperamide (IMODIUM A-D) 2 MG tablet Take 1 tablet (2 mg total) by mouth 4 (four) times daily as needed for diarrhea or loose stools. 30 tablet 0   methocarbamol (ROBAXIN) 500 MG tablet TAKE 1 TABLET BY MOUTH EVERY 8 HOURS AS NEEDED FOR MUSCLE SPASM 20 tablet 0   metoprolol tartrate (LOPRESSOR) 100 MG tablet TAKE 2 HOURS PRIOR TO CT 1 tablet 0   montelukast (SINGULAIR) 10 MG tablet TAKE 1 TABLET BY MOUTH AT BEDTIME 90 tablet 0   Multiple Vitamins-Minerals (MULTIVITAMIN WITH  MINERALS) tablet Take 1 tablet by mouth daily.     nystatin (MYCOSTATIN/NYSTOP) powder Apply 1 application topically 3 (three) times daily. (Patient taking differently: Apply 1 application  topically 3 (three) times daily as needed (skin irritation (yeast)).) 60 g 1   ondansetron (ZOFRAN) 4 MG tablet Take 1 tablet (4 mg total) by mouth every 6 (six) hours as needed for nausea. 20 tablet 0   ondansetron (ZOFRAN-ODT) 4 MG disintegrating tablet Take 1 tablet (4 mg total) by mouth every 8 (eight) hours as needed. 12 tablet 1   pantoprazole (PROTONIX) 40 MG tablet Take 1 tablet by mouth twice daily 180 tablet 1   Polyvinyl Alcohol-Povidone PF (REFRESH) 1.4-0.6 % SOLN Place 1-2 drops into both eyes 3 (three) times daily as needed (dry/irritated eyes).     potassium chloride (KLOR-CON) 10 MEQ tablet Take 1 tablet (10 mEq total) by mouth daily. 30 tablet 11   rivaroxaban (XARELTO) 20 MG TABS tablet Take 1 tablet (20 mg total) by mouth daily with supper. 30 tablet 5   rizatriptan (MAXALT) 10 MG tablet As needed for headache; may repeat in 2 hours if needed; max 2 per day or 8 per month 8 tablet 6   senna-docusate (SENOKOT-S) 8.6-50 MG tablet Take 1 tablet by mouth at bedtime as needed for mild constipation. 30 tablet 0   sertraline (ZOLOFT) 100 MG tablet Take 1 tablet (100 mg total) by mouth daily. 135 tablet 1   topiramate (TOPAMAX) 50 MG tablet Take 1 tablet (50 mg total) by mouth 2 (two) times daily. 180 tablet 3   traZODone (DESYREL) 50 MG tablet Take 1 tablet (50 mg total) by mouth at  bedtime. 30 tablet 2   valsartan (DIOVAN) 320 MG tablet Take 1 tablet (320 mg total) by mouth daily. 90 tablet 3   Vitamin D, Ergocalciferol, (DRISDOL) 1.25 MG (50000 UNIT) CAPS capsule Take 1 capsule (50,000 Units total) by mouth every 7 (seven) days. 12 capsule 0   No facility-administered medications prior to visit.    Allergies  Allergen Reactions   Megace [Megestrol] Other (See Comments)    Pt is s/p PE and DVT  on low dose Megace   Adhesive [Tape] Other (See Comments)    Skin Burn.   Biofreeze [Menthol (Topical Analgesic)] Other (See Comments)    Skin Burn.   Camphor Other (See Comments)    Burning sensation.   Folic Acid Hives   Lisinopril Cough   Menthol Other (See Comments)    Burning sensation.   Sulfa Antibiotics Rash    Review of Systems  Gastrointestinal:  Negative for abdominal pain.  Musculoskeletal:  Positive for back pain (Behind Right Armpit).       Objective:    Physical Exam Constitutional:      General: She is not in acute distress.    Appearance: Normal appearance. She is not ill-appearing.  HENT:     Head: Normocephalic and atraumatic.     Right Ear: External ear normal.     Left Ear: External ear normal.  Eyes:     Extraocular Movements: Extraocular movements intact.     Pupils: Pupils are equal, round, and reactive to light.  Neck:     Thyroid: No thyromegaly.  Cardiovascular:     Rate and Rhythm: Normal rate and regular rhythm.     Heart sounds: Normal heart sounds. No murmur heard.    No gallop.  Pulmonary:     Effort: Pulmonary effort is normal. No respiratory distress.     Breath sounds: Normal breath sounds. No wheezing or rales.  Musculoskeletal:     Thoracic back: Tenderness present.     Comments: Tenderness right thoracic spine  Lymphadenopathy:     Cervical: No cervical adenopathy.  Skin:    General: Skin is warm and dry.  Neurological:     Mental Status: She is alert and oriented to person, place, and time.  Psychiatric:        Mood and Affect: Mood normal.        Behavior: Behavior normal.        Judgment: Judgment normal.     There were no vitals taken for this visit. Wt Readings from Last 3 Encounters:  09/28/21 (!) 303 lb (137.4 kg)  08/04/21 (!) 302 lb (137 kg)  06/28/21 (!) 303 lb 1.9 oz (137.5 kg)       Assessment & Plan:   Problem List Items Addressed This Visit   None  No orders of the defined types were placed in  this encounter.   I, [name], personally preformed the services described in this documentation.  All medical record entries made by the scribe were at my direction and in my presence.  I have reviewed the chart and discharge instructions (if applicable) and agree that the record reflects my personal performance and is accurate and complete. 10/12/2021   I,Amber Collins,acting as a scribe for [name], NP.,have documented all relevant documentation on the behalf of [name], NP,as directed by  [name], NP while in the presence of [name], NP.   [employer]

## 2021-10-12 NOTE — Assessment & Plan Note (Signed)
Lab Results  Component Value Date   TSH 7.17 (H) 08/06/2021   Continues synthroid, obtain follow up TSH.

## 2021-10-13 DIAGNOSIS — M5414 Radiculopathy, thoracic region: Secondary | ICD-10-CM | POA: Insufficient documentation

## 2021-10-13 LAB — BASIC METABOLIC PANEL
BUN: 16 mg/dL (ref 6–23)
CO2: 26 mEq/L (ref 19–32)
Calcium: 9.3 mg/dL (ref 8.4–10.5)
Chloride: 102 mEq/L (ref 96–112)
Creatinine, Ser: 1.18 mg/dL (ref 0.40–1.20)
GFR: 54.1 mL/min — ABNORMAL LOW (ref >60.00)
Glucose, Bld: 84 mg/dL (ref 70–99)
Potassium: 3.5 mEq/L (ref 3.5–5.1)
Sodium: 137 mEq/L (ref 135–145)

## 2021-10-13 LAB — TSH: TSH: 7.19 u[IU]/mL — ABNORMAL HIGH (ref 0.35–5.50)

## 2021-10-13 LAB — VITAMIN D 25 HYDROXY (VIT D DEFICIENCY, FRACTURES): VITD: 39.46 ng/mL (ref 30.00–100.00)

## 2021-10-13 NOTE — Assessment & Plan Note (Signed)
New. Rx with medrol dose pak.

## 2021-10-14 ENCOUNTER — Telehealth: Payer: Self-pay | Admitting: Family

## 2021-10-14 DIAGNOSIS — E039 Hypothyroidism, unspecified: Secondary | ICD-10-CM

## 2021-10-14 MED ORDER — LEVOTHYROXINE SODIUM 175 MCG PO TABS: 175.0000 ug | ORAL_TABLET | Freq: Every day | ORAL | 0 refills | Status: DC

## 2021-10-14 NOTE — Addendum Note (Signed)
Addended by: Jiles Prows on: 10/14/2021 12:06 PM   Modules accepted: Orders

## 2021-10-14 NOTE — Telephone Encounter (Signed)
Patient advised of results and  medication dose increase. She was scheduled for TSH 11/26/21

## 2021-10-14 NOTE — Telephone Encounter (Addendum)
Vit D looks good.  Synthroid needs to be increased form 150 to 175 mcg and needs repeat TSH in 6 weeks.

## 2021-10-16 LAB — DRUG MONITORING, PANEL 8 WITH CONFIRMATION, URINE
6 Acetylmorphine: NEGATIVE ng/mL (ref <10)
Alcohol Metabolites: NEGATIVE ng/mL (ref <500)
Alphahydroxyalprazolam: 52 ng/mL — ABNORMAL HIGH (ref <25)
Alphahydroxymidazolam: NEGATIVE ng/mL (ref <50)
Alphahydroxytriazolam: NEGATIVE ng/mL (ref <50)
Aminoclonazepam: NEGATIVE ng/mL (ref <25)
Amphetamines: NEGATIVE ng/mL (ref <500)
Benzodiazepines: POSITIVE ng/mL — AB (ref <100)
Buprenorphine, Urine: NEGATIVE ng/mL (ref <5)
Cocaine Metabolite: NEGATIVE ng/mL (ref <150)
Creatinine: >300 mg/dL (ref >=20.0)
Hydroxyethylflurazepam: NEGATIVE ng/mL (ref <50)
Lorazepam: NEGATIVE ng/mL (ref <50)
MDMA: NEGATIVE ng/mL (ref <500)
Marijuana Metabolite: NEGATIVE ng/mL (ref <20)
Marijuana Metabolite: NEGATIVE ng/mL (ref <5)
Nordiazepam: NEGATIVE ng/mL (ref <50)
Opiates: NEGATIVE ng/mL (ref <100)
Oxazepam: NEGATIVE ng/mL (ref <50)
Oxidant: NEGATIVE ug/mL (ref <200)
Oxycodone: NEGATIVE ng/mL (ref <100)
Temazepam: NEGATIVE ng/mL (ref <50)
pH: 5.3 (ref 4.5–9.0)

## 2021-10-16 LAB — DM TEMPLATE

## 2021-10-18 ENCOUNTER — Telehealth: Payer: Self-pay | Admitting: Family

## 2021-10-18 NOTE — Telephone Encounter (Signed)
Opened in error

## 2021-10-20 ENCOUNTER — Encounter: Payer: Self-pay | Admitting: Family

## 2021-10-20 DIAGNOSIS — R109 Unspecified abdominal pain: Secondary | ICD-10-CM

## 2021-10-21 NOTE — Telephone Encounter (Signed)
Patient completed Medrol Dosepak that was prescribed on 10/12/21 with no relief of back pain.

## 2021-11-04 ENCOUNTER — Other Ambulatory Visit: Payer: Self-pay | Admitting: Family

## 2021-11-11 ENCOUNTER — Encounter (HOSPITAL_COMMUNITY)
Admission: RE | Admit: 2021-11-11 | Discharge: 2021-11-11 | Disposition: A | Discharge status: Home / Self Care | Payer: Commercial Managed Care - HMO | Source: Ambulatory Visit | Attending: Family | Admitting: Family

## 2021-11-11 DIAGNOSIS — R109 Unspecified abdominal pain: Secondary | ICD-10-CM | POA: Diagnosis present

## 2021-11-11 MED ORDER — TECHNETIUM TC 99M MEBROFENIN IV KIT
5.4000 | PACK | Freq: Once | INTRAVENOUS | Status: AC | PRN
  Administered 2021-11-11: 5.4 via INTRAVENOUS

## 2021-11-14 ENCOUNTER — Other Ambulatory Visit: Payer: Self-pay | Admitting: Family

## 2021-11-26 ENCOUNTER — Other Ambulatory Visit: Payer: Commercial Managed Care - HMO

## 2021-11-29 ENCOUNTER — Encounter: Payer: Self-pay | Admitting: *Deleted

## 2021-12-01 ENCOUNTER — Other Ambulatory Visit: Payer: Self-pay | Admitting: Family

## 2021-12-02 NOTE — Telephone Encounter (Signed)
Last RX:11-05-2021 Last OV:10-12-2021 Next OV:04-12-2022 UDS:10-12-2021 CSC:10-30-2021

## 2021-12-09 ENCOUNTER — Telehealth: Payer: Self-pay | Admitting: Family

## 2021-12-09 NOTE — Telephone Encounter (Signed)
Please contact pt to schedule lab visit to recheck thyroid testing.

## 2021-12-13 ENCOUNTER — Other Ambulatory Visit (INDEPENDENT_AMBULATORY_CARE_PROVIDER_SITE_OTHER): Payer: Commercial Managed Care - HMO

## 2021-12-13 DIAGNOSIS — E039 Hypothyroidism, unspecified: Secondary | ICD-10-CM

## 2021-12-13 LAB — TSH: TSH: 8.3 u[IU]/mL — ABNORMAL HIGH (ref 0.35–5.50)

## 2021-12-14 ENCOUNTER — Other Ambulatory Visit: Payer: Self-pay | Admitting: Family

## 2021-12-14 DIAGNOSIS — E039 Hypothyroidism, unspecified: Secondary | ICD-10-CM

## 2021-12-14 NOTE — Telephone Encounter (Signed)
Lab work shows that her thyroid medication dose is too low.  Confirm she has been taking regularly.  If so, will plan to increase synthroid from 115mg to 2031m daily (Rx pended) and repeat tsh in 6 weeks.  Take in the AM on empty stomach. Wait 30 minutes before taking other meds or eating.

## 2021-12-19 NOTE — Telephone Encounter (Signed)
Just double checking that you have this message. tks

## 2021-12-19 NOTE — Telephone Encounter (Signed)
All set, tks.

## 2021-12-21 ENCOUNTER — Encounter: Payer: Self-pay | Admitting: General Practice

## 2021-12-21 ENCOUNTER — Other Ambulatory Visit: Payer: Self-pay

## 2021-12-21 DIAGNOSIS — E039 Hypothyroidism, unspecified: Secondary | ICD-10-CM

## 2021-12-21 MED ORDER — LEVOTHYROXINE SODIUM 200 MCG PO TABS: 200.0000 ug | ORAL_TABLET | Freq: Every day | ORAL | 0 refills | Status: DC

## 2021-12-21 NOTE — Telephone Encounter (Signed)
Patient advised of results, she reports she has been taking as indicated. New dose sent to pharmacy and scheduled for repeat tsh 01-24-2022

## 2021-12-27 ENCOUNTER — Encounter: Payer: Self-pay | Admitting: Family

## 2021-12-28 ENCOUNTER — Encounter: Payer: Self-pay | Admitting: Family

## 2021-12-29 ENCOUNTER — Inpatient Hospital Stay: Payer: Commercial Managed Care - HMO | Admitting: Family

## 2021-12-29 ENCOUNTER — Inpatient Hospital Stay: Payer: Commercial Managed Care - HMO

## 2022-01-10 ENCOUNTER — Telehealth: Payer: Self-pay | Admitting: Family

## 2022-01-10 NOTE — Telephone Encounter (Signed)
See mychart.  

## 2022-01-10 NOTE — Progress Notes (Unsigned)
PATIENT: Sharon Rivers DOB: 06/06/1970  REASON FOR VISIT: follow up HISTORY FROM: patient  Virtual Visit via Telephone Note  I connected with Sharon Rivers on 01/11/22 at  3:00 PM EST by telephone and verified that I am speaking with the correct person using two identifiers.   I discussed the limitations, risks, security and privacy concerns of performing an evaluation and management service by telephone and the availability of in person appointments. I also discussed with the patient that there may be a patient responsible charge related to this service. The patient expressed understanding and agreed to proceed.   History of Present Illness:  01/11/22 ALL Mychart): Sharon Rivers is a 51 y.o. female here today for follow up for migraines. She continues topiramate and rizatriptan. She feels migraines are well managed. She rarely has migraines. She has not taken rizatriptan recently. She is followed closely by PCP.   01/14/21 ALL:  Sharon Rivers is a 51 y.o. female here today for follow up for migraines. She was advised to stop carbamazepine at consult with Dr Leta Baptist 07/2020 and start topiramate '50mg'$  BID. Rizatriptan rx'd for abortive therapy. CT head ordered but has not been completed. Since, migraines have resolved. She was started on CPAP with pulmonology. She has 4-5 tensions a month relieved by Tylenol. She has not used rizatriptan.    Observations/Objective:  Generalized: Well developed, in no acute distress  Mentation: Alert oriented to time, place, history taking. Follows all commands speech and language fluent   Assessment and Plan:  51 y.o. year old female  has a past medical history of Anxiety, Back pain, Chest pain, Constipation, Depression, Edema, Frequent headaches, GERD (gastroesophageal reflux disease), History of DVT (deep vein thrombosis) (2018), Hypertension, Hypothyroidism, Insulin resistance, Joint pain, Migraines, OSA (obstructive sleep apnea) (03/17/2016),  Osteoarthritis, Panic anxiety syndrome, PONV (postoperative nausea and vomiting), Pulmonary embolism ([place]) (2022), SOB (shortness of breath), and Swallowing difficulty. here with    ICD-10-CM   1. Migraine without aura and without status migrainosus, not intractable  G43.009        She is doing well. Migraines are well managed on topiramate '50mg'$  BID and rizatriptan PRN. We will continue current treatment plan. Se will continue CPAP as directed by pulmonology. Healthy lifestyle habits encouraged. She will follow up with PCP for refills and call me as needed.     No orders of the defined types were placed in this encounter.   Meds ordered this encounter  Medications   topiramate (TOPAMAX) 50 MG tablet    Sig: Take 1 tablet (50 mg total) by mouth 2 (two) times daily.    Dispense:  180 tablet    Refill:  3    Order Specific Question:   Supervising Provider    Answer:   Melvenia Beam [[id number]]   rizatriptan (MAXALT) 10 MG tablet    Sig: As needed for headache; may repeat in 2 hours if needed; max 2 per day or 8 per month    Dispense:  8 tablet    Refill:  6    Order Specific Question:   Supervising Provider    Answer:   Melvenia Beam [[id number]]     Follow Up Instructions:  I discussed the assessment and treatment plan with the patient. The patient was provided an opportunity to ask questions and all were answered. The patient agreed with the plan and demonstrated an understanding of the instructions.   The patient was advised to call back or seek an in-person evaluation  if the symptoms worsen or if the condition fails to improve as anticipated.  I provided 15 minutes of non-face-to-face time during this encounter. Patient located at their place of residence during [place] visit. Provider is in the office.    Debbora Presto, NP

## 2022-01-10 NOTE — Patient Instructions (Signed)
Below is our plan:  We will continue topiramate '50mg'$  twice daily and rizatriptan as needed.  Please make sure you are staying well hydrated. I recommend 50-60 ounces daily. Well balanced diet and regular exercise encouraged. Consistent sleep schedule with 6-8 hours recommended.   Please continue follow up with care team as directed.   Follow up with PCP for refills, call me if needed for worsening migraines.   You may receive a survey regarding today's visit. I encourage you to leave honest feed back as I do use this information to improve patient care. Thank you for seeing me today!

## 2022-01-11 ENCOUNTER — Telehealth: Payer: Commercial Managed Care - HMO | Admitting: Family Medicine

## 2022-01-11 ENCOUNTER — Encounter: Payer: Self-pay | Admitting: Family Medicine

## 2022-01-11 DIAGNOSIS — G43009 Migraine without aura, not intractable, without status migrainosus: Secondary | ICD-10-CM

## 2022-01-11 MED ORDER — RIZATRIPTAN BENZOATE 10 MG PO TABS: ORAL_TABLET | ORAL | 6 refills | Status: DC

## 2022-01-11 MED ORDER — TOPIRAMATE 50 MG PO TABS: 50.0000 mg | ORAL_TABLET | Freq: Two times a day (BID) | ORAL | 3 refills | Status: DC

## 2022-01-15 ENCOUNTER — Other Ambulatory Visit: Payer: Self-pay | Admitting: Family

## 2022-01-20 ENCOUNTER — Ambulatory Visit: Payer: 59 | Admitting: Family Medicine

## 2022-01-27 ENCOUNTER — Other Ambulatory Visit: Payer: Self-pay | Admitting: Cardiology

## 2022-01-27 ENCOUNTER — Other Ambulatory Visit: Payer: Self-pay | Admitting: Family

## 2022-02-03 ENCOUNTER — Other Ambulatory Visit: Payer: Commercial Managed Care - HMO

## 2022-02-20 ENCOUNTER — Other Ambulatory Visit: Payer: Self-pay | Admitting: Family

## 2022-03-11 ENCOUNTER — Encounter: Payer: Self-pay | Admitting: Physical Therapy

## 2022-03-22 ENCOUNTER — Ambulatory Visit: Payer: Self-pay | Admitting: Physical Therapy

## 2022-03-29 ENCOUNTER — Ambulatory Visit: Payer: Self-pay

## 2022-04-01 ENCOUNTER — Other Ambulatory Visit: Payer: Self-pay | Admitting: Family

## 2022-04-01 NOTE — Telephone Encounter (Signed)
Requesting: alprazolam 0.'25mg'$   Contract: 10/12/21 UDS: 10/12/21 Last Visit: 10/12/21 Next Visit: 04/12/22  Last Refill: 02/21/22 #30 and 0RF   Please Advise

## 2022-04-03 MED ORDER — ALPRAZOLAM 0.25 MG PO TABS: ORAL_TABLET | ORAL | 0 refills | Status: DC

## 2022-04-03 MED ORDER — ALPRAZOLAM 0.25 MG PO TABS
ORAL_TABLET | ORAL | 0 refills | Status: DC
Start: 2022-04-03 — End: 2022-04-03

## 2022-04-04 ENCOUNTER — Other Ambulatory Visit: Payer: Self-pay | Admitting: Family

## 2022-04-04 MED ORDER — ALPRAZOLAM 0.25 MG PO TABS: ORAL_TABLET | ORAL | 0 refills | Status: DC

## 2022-04-05 ENCOUNTER — Encounter: Payer: Self-pay | Admitting: Physical Therapy

## 2022-04-11 ENCOUNTER — Encounter: Payer: Self-pay | Admitting: Family

## 2022-04-12 ENCOUNTER — Encounter: Payer: Self-pay | Admitting: Physical Therapy

## 2022-04-12 ENCOUNTER — Telehealth (INDEPENDENT_AMBULATORY_CARE_PROVIDER_SITE_OTHER): Payer: Self-pay | Admitting: Family

## 2022-04-12 DIAGNOSIS — E559 Vitamin D deficiency, unspecified: Secondary | ICD-10-CM

## 2022-04-12 DIAGNOSIS — E039 Hypothyroidism, unspecified: Secondary | ICD-10-CM

## 2022-04-12 DIAGNOSIS — G8929 Other chronic pain: Secondary | ICD-10-CM

## 2022-04-12 DIAGNOSIS — M545 Low back pain, unspecified: Secondary | ICD-10-CM

## 2022-04-12 DIAGNOSIS — F419 Anxiety disorder, unspecified: Secondary | ICD-10-CM

## 2022-04-12 DIAGNOSIS — K219 Gastro-esophageal reflux disease without esophagitis: Secondary | ICD-10-CM

## 2022-04-12 DIAGNOSIS — G4700 Insomnia, unspecified: Secondary | ICD-10-CM

## 2022-04-12 DIAGNOSIS — I10 Essential (primary) hypertension: Secondary | ICD-10-CM

## 2022-04-12 MED ORDER — AMLODIPINE BESYLATE 5 MG PO TABS
5.0000 mg | ORAL_TABLET | Freq: Every day | ORAL | 1 refills | Status: DC
Start: 2022-04-12 — End: 2022-11-13

## 2022-04-12 NOTE — Assessment & Plan Note (Signed)
Lab Results  Component Value Date   TSH 8.30 (H) 12/13/2021   Continues synthroid, repeat TSH.

## 2022-04-12 NOTE — Assessment & Plan Note (Signed)
Pt continues otc vitamin D 2000 iu.

## 2022-04-12 NOTE — Assessment & Plan Note (Signed)
Stable with prn xanax, continue same.  Contract up to date.

## 2022-04-12 NOTE — Assessment & Plan Note (Signed)
BP Readings from Last 3 Encounters:  10/12/21 113/74  09/28/21 127/76  08/04/21 (!) 128/94   130/74 today.

## 2022-04-12 NOTE — Progress Notes (Signed)
MyChart Video Visit    Virtual Visit via Video Note    Patient location: Home. Patient and provider in visit Provider location: Office  I discussed the limitations of evaluation and management by telemedicine and the availability of in person appointments. The patient expressed understanding and agreed to proceed.  Visit Date: 04/12/2022  Today's healthcare provider: [name], NP     Subjective:    Patient ID: Sharon Rivers, female    DOB: 30-Dec-1970, 52 y.o.   MRN: [id number]  Chief Complaint  Patient presents with   Hypertension    Follow up   Hypothyroidism   Nasal Congestion   Emesis    Patient complains of vomiting Sunday   Diarrhea    Patient reports having diarrhea that started today.    HPI The patient is in today for a 6 month follow up.   Sinus: She complains of sinus congestion beginning Thursday and worsening on Sunday. She endorses vomiting, cough and chest congestion beginning Sunday. She reports a fever on Sunday and Monday morning.   Anxiety: She reports her anxiety is being well managed. She is compliant Xanax as needed.   Chronic back pain: She continues to have back pain. She notes that the pain is worse in her lower in her lower back. She has been going to physical therapy. She is following up for care at [facility] on [facility] in [place].   Blood pressure: She has bee monitoring her blood pressure at home. She reports a reading of 130/74.   Thyroid: She is compliant with her Synthroid.   Vitamin D: She has been taking Vitamin D daily.   Refills: She requests a refill on Amlodipine.   Past Medical History:  Diagnosis Date   Anxiety    Back pain    Chest pain    a. 07/2015 Myoview: EF 71%, medium size, mild intensity, partially reversible septal defect with overlying breast attenuation -> likely artifact. No significant reversible ischemia.   Constipation    Depression    Edema    feet and  legs   Frequent headaches    GERD (gastroesophageal reflux disease)    History of DVT (deep vein thrombosis) 2018   Hypertension    Hypothyroidism    Insulin resistance    Joint pain    Migraines    OSA (obstructive sleep apnea) 03/17/2016   Osteoarthritis    Panic anxiety syndrome    PONV (postoperative nausea and vomiting)    Pulmonary embolism ([place]) 2022   SOB (shortness of breath)    a. 04/2016 Echo: EF 60-65%, Gr1 DD.   Swallowing difficulty     Past Surgical History:  Procedure Laterality Date   CESAREAN SECTION  2001 & 2002   DILATION AND CURETTAGE OF UTERUS N/A 09/08/2020   Procedure: DILATATION AND CURETTAGE;  Surgeon: Lavonia Drafts, MD;  Location: [facility];  Service: Gynecology;  Laterality: N/A;   ELBOW SURGERY     HYSTEROSCOPY WITH NOVASURE N/A 09/08/2020   Procedure: HYSTEROSCOPY WITH NOVASURE;  Surgeon: Lavonia Drafts, MD;  Location: [facility];  Service: Gynecology;  Laterality: N/A;   MINOR CARPAL TUNNEL     pinched nerve in elbow   WISDOM TOOTH EXTRACTION      Family History  Problem Relation Age of Onset   Hypertension Mother        Living   Arthritis Mother    Thyroid disease Mother    Heart disease Mother  MI at age 57   Heart attack Mother    Migraines Mother    Depression Mother    Obesity Mother    Congestive Heart Failure Father        ?CHF   Hypertension Maternal Grandmother    Parkinson's disease Maternal Grandmother    Alzheimer's disease Maternal Grandmother    Cancer Maternal Grandfather    Hypertension Maternal Grandfather    Hypertension Paternal Grandmother    Hypertension Paternal Grandfather    Gout Brother    ADD / ADHD Son        x1   Other Son        #2-Unknown   Diabetes Neg Hx     Social History   Socioeconomic History   Marital status: Single    Spouse name: Not on file   Number of children: 2   Years of education: Not on file   Highest education level: Associate degree: occupational,  Hotel manager, or vocational program  Occupational History   Occupation: Loss adjuster, chartered    Comment: [employer]  Tobacco Use   Smoking status: Former    Types: Cigarettes    Quit date: 1992    Years since quitting: 32.2   Smokeless tobacco: Never   Tobacco comments:    quit 1992  Vaping Use   Vaping Use: Never used  Substance and Sexual Activity   Alcohol use: No    Alcohol/week: 0.0 standard drinks of alcohol   Drug use: No   Sexual activity: Yes    Partners: Male    Birth control/protection: None  Other Topics Concern   Not on file  Social History Narrative   She has 2 children (grown). She did not retain custody of these children.   Lives with boyfriend in [place].   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 20 tablet 0   ALPRAZolam (XANAX) 0.25 MG tablet TAKE 1 TABLET BY MOUTH TWICE DAILY AS NEEDED FOR ANXIETY AND FOR SLEEP Strength: 0.25 mg 30 tablet 0   azelastine (ASTELIN) 0.1 % nasal spray Place 1 spray into both nostrils 2 (two) times daily. Use in each nostril as directed (Patient taking differently: Place 1 spray into both nostrils 2 (two) times daily as needed for allergies. Use in each nostril as directed) 30 mL 5   Cholecalciferol (VITAMIN D3) 50 MCG (2000 UT) capsule Take 1 capsule (2,000 Units total) by mouth daily.     docusate sodium (COLACE) 100 MG capsule Take 100 mg by mouth 2 (two) times daily as needed for mild constipation.     fluticasone (FLONASE) 50 MCG/ACT nasal  spray Place 2 sprays into both nostrils daily. (Patient taking differently: Place 2 sprays into both nostrils daily as needed for allergies.) 16 g 6   furosemide (LASIX) 20 MG tablet Take 1 tablet (20 mg total) by mouth daily. 30 tablet 11   hydrochlorothiazide (MICROZIDE) 12.5 MG capsule Take 1 capsule (12.5 mg total) by mouth daily.  90 capsule 0   hydrOXYzine (VISTARIL) 25 MG capsule Take 1 capsule (25 mg total) by mouth at bedtime as needed (sleep). 90 capsule 1   levothyroxine (SYNTHROID) 200 MCG tablet Take 1 tablet (200 mcg total) by mouth daily. 90 tablet 0   methocarbamol (ROBAXIN) 500 MG tablet TAKE 1 TABLET BY MOUTH EVERY 8 HOURS AS NEEDED FOR MUSCLE SPASM 20 tablet 0   montelukast (SINGULAIR) 10 MG tablet Take 1 tablet (10 mg total) by mouth at bedtime. 90 tablet 0   Multiple Vitamins-Minerals (MULTIVITAMIN WITH MINERALS) tablet Take 1 tablet by mouth daily.     nystatin (MYCOSTATIN/NYSTOP) powder Apply 1 application topically 3 (three) times daily. (Patient taking differently: Apply 1 application  topically 3 (three) times daily as needed (skin irritation (yeast)).) 60 g 1   ondansetron (ZOFRAN) 4 MG tablet Take 1 tablet (4 mg total) by mouth every 6 (six) hours as needed for nausea. 20 tablet 0   pantoprazole (PROTONIX) 40 MG tablet Take 1 tablet by mouth twice daily 180 tablet 1   Polyvinyl Alcohol-Povidone PF (REFRESH) 1.4-0.6 % SOLN Place 1-2 drops into both eyes 3 (three) times daily as needed (dry/irritated eyes).     potassium chloride (KLOR-CON) 10 MEQ tablet Take 1 tablet (10 mEq total) by mouth daily. 30 tablet 11   rivaroxaban (XARELTO) 20 MG TABS tablet Take 1 tablet (20 mg total) by mouth daily with supper. 30 tablet 5   rizatriptan (MAXALT) 10 MG tablet As needed for headache; may repeat in 2 hours if needed; max 2 per day or 8 per month 8 tablet 6   senna-docusate (SENOKOT-S) 8.6-50 MG tablet Take 1 tablet by mouth at bedtime as needed for mild constipation. 30 tablet 0    sertraline (ZOLOFT) 100 MG tablet Take 1.5 tablets (150 mg total) by mouth daily. 135 tablet 1   topiramate (TOPAMAX) 50 MG tablet Take 1 tablet (50 mg total) by mouth 2 (two) times daily. 180 tablet 3   traZODone (DESYREL) 50 MG tablet Take 1 tablet (50 mg total) by mouth at bedtime. 90 tablet 1   valsartan (DIOVAN) 320 MG tablet Take 1 tablet (320 mg total) by mouth daily. 90 tablet 1   amLODipine (NORVASC) 5 MG tablet Take 1 tablet (5 mg total) by mouth daily. 90 tablet 0   doxycycline (VIBRA-TABS) 100 MG tablet Take 1 tablet (100 mg total) by mouth 2 (two) times daily. 14 tablet 0   No facility-administered medications prior to visit.    Allergies  Allergen Reactions   Megace [Megestrol] Other (See Comments)    Pt is s/p PE and DVT on low dose Megace   Adhesive [Tape] Other (See Comments)    Skin Burn.   Biofreeze [Menthol (Topical Analgesic)] Other (See Comments)    Skin Burn.   Camphor Other (See Comments)    Burning sensation.   Folic Acid Hives   Lisinopril Cough   Menthol Other (See Comments)    Burning sensation.   Sulfa Antibiotics Rash    Review of Systems  Constitutional:  Positive for fever.  HENT:  Positive for congestion (sinus and chest congestion).   Respiratory:  Positive for cough.   Gastrointestinal:  Positive for vomiting.       Objective:    Physical Exam Constitutional:      General: She is awake. She is not in acute distress. Pulmonary:     Effort: Pulmonary effort is normal.  Neurological:     Mental Status: She  is alert and oriented to person, place, and time.     Cranial Nerves: No facial asymmetry.  Psychiatric:        Mood and Affect: Mood normal.        Speech: Speech normal.     There were no vitals taken for this visit. Wt Readings from Last 3 Encounters:  10/12/21 (!) 302 lb (137 kg)  09/28/21 (!) 303 lb (137.4 kg)  08/04/21 (!) 302 lb (137 kg)       Assessment & Plan:   Problem List Items Addressed This Visit        Unprioritized   Vitamin D deficiency    Pt continues otc vitamin D 2000 iu.        Insomnia    Reports that she is sleeping well.       Hypothyroid    Lab Results  Component Value Date   TSH 8.30 (H) 12/13/2021  Continues synthroid, repeat TSH.       Relevant Orders   TSH (Completed)   Hypertension    BP Readings from Last 3 Encounters:  10/12/21 113/74  09/28/21 127/76  08/04/21 (!) 128/94  130/74 today.        Relevant Medications   amLODipine (NORVASC) 5 MG tablet   Other Relevant Orders   Basic Metabolic Panel (BMET) (Completed)   GERD (gastroesophageal reflux disease)    Stable/improved on pantoprazole. Continue same.        Chronic low back pain    Stable- management per neurosurgery.       Anxiety - Primary    Stable with prn xanax, continue same.  Contract up to date.       Meds ordered this encounter  Medications   amLODipine (NORVASC) 5 MG tablet    Sig: Take 1 tablet (5 mg total) by mouth daily.    Dispense:  90 tablet    Refill:  1    Order Specific Question:   Supervising Provider    Answer:   Penni Homans A [[id number]]    I discussed the assessment and treatment plan with the patient. The patient was provided an opportunity to ask questions and all were answered. The patient agreed with the plan and demonstrated an understanding of the instructions.   The patient was advised to call back or seek an in-person evaluation if the symptoms worsen or if the condition fails to improve as anticipated.  I,Rachel Rivera,acting as a Education administrator for [name], NP.,have documented all relevant documentation on the behalf of [name], NP,as directed by  [name], NP while in the presence of [name], NP.  [name], NP [facility] (phone) [phone] (fax)  [place]

## 2022-04-12 NOTE — Assessment & Plan Note (Signed)
Stable- management per neurosurgery.

## 2022-04-12 NOTE — Assessment & Plan Note (Signed)
Stable/improved on pantoprazole. Continue same.

## 2022-04-12 NOTE — Assessment & Plan Note (Signed)
Reports that she is sleeping well.

## 2022-04-13 ENCOUNTER — Other Ambulatory Visit (INDEPENDENT_AMBULATORY_CARE_PROVIDER_SITE_OTHER): Payer: No Typology Code available for payment source

## 2022-04-13 ENCOUNTER — Telehealth: Payer: Self-pay | Admitting: Family

## 2022-04-13 DIAGNOSIS — I10 Essential (primary) hypertension: Secondary | ICD-10-CM | POA: Diagnosis not present

## 2022-04-13 DIAGNOSIS — E039 Hypothyroidism, unspecified: Secondary | ICD-10-CM | POA: Diagnosis not present

## 2022-04-13 LAB — BASIC METABOLIC PANEL WITH GFR
BUN: 14 mg/dL (ref 6–23)
CO2: 24 meq/L (ref 19–32)
Calcium: 9 mg/dL (ref 8.4–10.5)
Chloride: 107 meq/L (ref 96–112)
Creatinine, Ser: 1.07 mg/dL (ref 0.40–1.20)
GFR: 60.63 mL/min
Glucose, Bld: 98 mg/dL (ref 70–99)
Potassium: 3.8 meq/L (ref 3.5–5.1)
Sodium: 140 meq/L (ref 135–145)

## 2022-04-13 LAB — TSH: TSH: 11.24 u[IU]/mL — ABNORMAL HIGH (ref 0.35–5.50)

## 2022-04-13 MED ORDER — LEVOTHYROXINE SODIUM 25 MCG PO TABS: 25.0000 ug | ORAL_TABLET | Freq: Every day | ORAL | 1 refills | Status: DC

## 2022-04-13 NOTE — Telephone Encounter (Signed)
Lab work shows synthroid needs to be increased.  Recommend that she continue synthroid 248mg and add synthroid 25 mcg once daily. Take in AM on empty stomach, wait 30 minutes before food or other medications. Repeat tsh in 6 weeks, dx hypothyroid.

## 2022-04-14 NOTE — Telephone Encounter (Signed)
Patient advised of results and provider's new orders to increase dose. She verbalized understanding and she was scheduled to return in 6 weeks for TSH

## 2022-04-16 ENCOUNTER — Encounter: Payer: Self-pay | Admitting: Family

## 2022-04-17 ENCOUNTER — Other Ambulatory Visit: Payer: Self-pay | Admitting: Family

## 2022-04-17 MED ORDER — WEGOVY 0.25 MG/0.5ML ~~LOC~~ SOAJ: 0.2500 mg | SUBCUTANEOUS | 0 refills | Status: DC

## 2022-04-18 ENCOUNTER — Telehealth: Payer: Self-pay

## 2022-04-18 NOTE — Telephone Encounter (Signed)
PA initiated via Covermymeds; KEY: [id number]. Awaiting determination.

## 2022-04-19 ENCOUNTER — Encounter: Payer: Self-pay | Admitting: Physical Therapy

## 2022-04-19 NOTE — Telephone Encounter (Signed)
PA denied. Plan exclusion.  

## 2022-04-28 ENCOUNTER — Encounter: Payer: Self-pay | Admitting: Cardiology

## 2022-04-28 ENCOUNTER — Encounter: Payer: Self-pay | Admitting: Family

## 2022-04-30 ENCOUNTER — Other Ambulatory Visit: Payer: Self-pay | Admitting: Family

## 2022-05-05 ENCOUNTER — Other Ambulatory Visit (HOSPITAL_BASED_OUTPATIENT_CLINIC_OR_DEPARTMENT_OTHER): Payer: Self-pay | Admitting: Student

## 2022-05-05 DIAGNOSIS — M5127 Other intervertebral disc displacement, lumbosacral region: Secondary | ICD-10-CM

## 2022-05-07 ENCOUNTER — Ambulatory Visit (HOSPITAL_BASED_OUTPATIENT_CLINIC_OR_DEPARTMENT_OTHER)
Admission: RE | Admit: 2022-05-07 | Discharge: 2022-05-07 | Disposition: A | Discharge status: Home / Self Care | Payer: No Typology Code available for payment source | Source: Ambulatory Visit | Attending: Student

## 2022-05-07 DIAGNOSIS — M5127 Other intervertebral disc displacement, lumbosacral region: Secondary | ICD-10-CM | POA: Insufficient documentation

## 2022-05-12 ENCOUNTER — Telehealth: Payer: Self-pay

## 2022-05-12 NOTE — Telephone Encounter (Signed)
Patient called about not returning cologuard test yet. She remembers getting it but has not complete. Patient advised to check box to make sure specimen container is still in good expiration date and to submit as soon as possible to check for colon cancer. She will call us if she needs test to be order again.

## 2022-05-19 ENCOUNTER — Other Ambulatory Visit: Payer: Self-pay | Admitting: Family

## 2022-05-19 ENCOUNTER — Telehealth: Payer: Self-pay | Admitting: *Deleted

## 2022-05-23 ENCOUNTER — Encounter: Payer: Self-pay | Admitting: *Deleted

## 2022-05-31 ENCOUNTER — Other Ambulatory Visit: Payer: No Typology Code available for payment source

## 2022-06-10 ENCOUNTER — Other Ambulatory Visit: Payer: Self-pay | Admitting: Family

## 2022-06-15 ENCOUNTER — Other Ambulatory Visit: Payer: Self-pay | Admitting: Family

## 2022-07-11 ENCOUNTER — Telehealth: Payer: Self-pay | Admitting: Family

## 2022-07-11 NOTE — Telephone Encounter (Signed)
See mychart.  

## 2022-07-20 ENCOUNTER — Other Ambulatory Visit: Payer: Self-pay | Admitting: Family

## 2022-07-20 ENCOUNTER — Other Ambulatory Visit: Payer: Self-pay | Admitting: Cardiology

## 2022-07-21 ENCOUNTER — Other Ambulatory Visit: Payer: Self-pay | Admitting: Cardiology

## 2022-07-21 ENCOUNTER — Other Ambulatory Visit: Payer: Self-pay | Admitting: Family

## 2022-07-22 LAB — HM MAMMOGRAPHY

## 2022-07-25 ENCOUNTER — Telehealth: Payer: Self-pay

## 2022-07-25 NOTE — Telephone Encounter (Signed)
[place] with [facility] states that a PA is needed for pantoprazole, caller states that insurance will not approve Rx to be taken 2x day w/o prior auth.   Please call [phone] to start PA

## 2022-07-26 ENCOUNTER — Other Ambulatory Visit (HOSPITAL_COMMUNITY): Payer: Self-pay

## 2022-07-27 ENCOUNTER — Encounter: Payer: Self-pay | Admitting: Family

## 2022-07-27 ENCOUNTER — Telehealth: Payer: Self-pay

## 2022-07-27 NOTE — Telephone Encounter (Signed)
Pharmacy Patient Advocate Encounter   Received notification that prior authorization for Pantoprazole Sodium 40MG  dr tablets is required/requested.   PA submitted to [facility] via CoverMyMeds Key or (Medicaid) confirmation # [id number] Status is pending

## 2022-07-27 NOTE — Telephone Encounter (Signed)
PA submitted via CMM. Created new encounter for PA. Will route back to pool once determination has been made. 

## 2022-07-29 ENCOUNTER — Telehealth: Payer: Self-pay | Admitting: Family

## 2022-07-29 MED ORDER — PANTOPRAZOLE SODIUM 40 MG PO TBEC: 40.0000 mg | DELAYED_RELEASE_TABLET | Freq: Every day | ORAL | 1 refills | Status: DC

## 2022-07-29 NOTE — Telephone Encounter (Signed)
Pharmacy Patient Advocate Encounter  Received notification from [employer] that the request for prior authorization for Pantoprazole 40mg  has been denied due to criteria for medical necessity were not met.       Please be advised we currently do not have a Pharmacist to review denials, therefore you will need to process appeals accordingly as needed. Thanks for your support at this time.   You may call [phone] or fax [phone], to appeal. Appeal is also available on CMM. Key: [id number]

## 2022-07-29 NOTE — Telephone Encounter (Signed)
This was denied

## 2022-07-29 NOTE — Telephone Encounter (Signed)
Pharmacy called and wanted to request a PA for The pantoprazole 40 mg.

## 2022-08-15 ENCOUNTER — Telehealth: Payer: Self-pay | Admitting: *Deleted

## 2022-08-15 NOTE — Telephone Encounter (Signed)
Charles sent a message.  This patient would like cpe after 5 on a Tuesday.

## 2022-08-15 NOTE — Telephone Encounter (Signed)
CE [facility] for CPE after 5 on tues?     Sharon Rivers  P [facility] Pool4 hours ago (11:23 AM)    I need this appointment moved to after 5 if possible please. I get off work at 5.    Sharon Rivers  Sharon Buddy Sexton7 hours ago (8:36 AM)   CE Good Morning Sharon Rivers,   Just wanted to let you know that I have received your appointment request and I have got you down for 7.10.24 @ 8a for that physical with Melissa. If you have any questions or need to reschedule your appointment, please feel free to reach out to the office at [phone].   Thank you, Sharon Rivers. [facility] Patient Access Advocate II      Sharon Rivers  P [facility] Pool8 hours ago (7:40 AM)    Appointment Request From: Sharon Rivers   With Provider: Lemont Fillers, NP [facility]]   Preferred Date Range: 08/15/2022 - 09/01/2022   Preferred Times: Any Time   Reason for visit: Annual Physical   Comments: Annual Physical

## 2022-08-17 ENCOUNTER — Encounter: Payer: No Typology Code available for payment source | Admitting: Family

## 2022-08-23 ENCOUNTER — Encounter: Payer: No Typology Code available for payment source | Admitting: Family

## 2022-08-23 ENCOUNTER — Ambulatory Visit (INDEPENDENT_AMBULATORY_CARE_PROVIDER_SITE_OTHER): Payer: No Typology Code available for payment source | Admitting: Family

## 2022-08-23 ENCOUNTER — Encounter: Payer: Self-pay | Admitting: Family

## 2022-08-23 VITALS — BP 123/67 | HR 74 | Temp 97.8°F | Ht 62.0 in | Wt 291.0 lb

## 2022-08-23 DIAGNOSIS — E039 Hypothyroidism, unspecified: Secondary | ICD-10-CM

## 2022-08-23 DIAGNOSIS — Z23 Encounter for immunization: Secondary | ICD-10-CM

## 2022-08-23 DIAGNOSIS — I10 Essential (primary) hypertension: Secondary | ICD-10-CM | POA: Diagnosis not present

## 2022-08-23 DIAGNOSIS — E559 Vitamin D deficiency, unspecified: Secondary | ICD-10-CM

## 2022-08-23 DIAGNOSIS — F419 Anxiety disorder, unspecified: Secondary | ICD-10-CM

## 2022-08-23 DIAGNOSIS — G43809 Other migraine, not intractable, without status migrainosus: Secondary | ICD-10-CM

## 2022-08-23 DIAGNOSIS — G4700 Insomnia, unspecified: Secondary | ICD-10-CM

## 2022-08-23 DIAGNOSIS — G4733 Obstructive sleep apnea (adult) (pediatric): Secondary | ICD-10-CM

## 2022-08-23 DIAGNOSIS — Z1211 Encounter for screening for malignant neoplasm of colon: Secondary | ICD-10-CM

## 2022-08-23 DIAGNOSIS — F3289 Other specified depressive episodes: Secondary | ICD-10-CM

## 2022-08-23 DIAGNOSIS — K219 Gastro-esophageal reflux disease without esophagitis: Secondary | ICD-10-CM

## 2022-08-23 DIAGNOSIS — Z86718 Personal history of other venous thrombosis and embolism: Secondary | ICD-10-CM

## 2022-08-23 DIAGNOSIS — Z0000 Encounter for general adult medical examination without abnormal findings: Secondary | ICD-10-CM

## 2022-08-23 NOTE — Assessment & Plan Note (Addendum)
Uses xanax prn with good improvement in symptoms.  Controlled substance contract updated today.

## 2022-08-23 NOTE — Assessment & Plan Note (Signed)
Pap smear due, last mammogram done recently, colonoscopy pending. -Schedule Pap smear with gynecology. -Send new colonoscopy kit for at-home testing. Shingrix #1 today Discussed healthy diet and adding regular exercise.

## 2022-08-23 NOTE — Assessment & Plan Note (Addendum)
Maintained on cpap. Advised her to schedule follow up with sleep specialist.

## 2022-08-23 NOTE — Assessment & Plan Note (Signed)
Stable on valsartan, hydrochlorothiazide and amlodipine.

## 2022-08-23 NOTE — Assessment & Plan Note (Signed)
Lab Results  Component Value Date   TSH 11.24 (H) 04/13/2022   Repeat tsh, maintained on synthroid daily.

## 2022-08-23 NOTE — Assessment & Plan Note (Signed)
Stable, no current concerns.

## 2022-08-23 NOTE — Assessment & Plan Note (Signed)
On Xarelto lifelong for blood clot. Planning a long trip to [place]. -Continue Xarelto as prescribed. -Use compression stockings during the trip. -Ensure frequent stops for walking every two hours during the trip.

## 2022-08-23 NOTE — Assessment & Plan Note (Signed)
Rn out of pantoprazole.

## 2022-08-23 NOTE — Assessment & Plan Note (Signed)
Stable with HS xanax. Update controlled substance contract today.

## 2022-08-23 NOTE — Assessment & Plan Note (Signed)
Some mild HA's due to recent dental extraction- no migraines.

## 2022-08-23 NOTE — Assessment & Plan Note (Signed)
On vit d 2000 international units daily.

## 2022-08-23 NOTE — Patient Instructions (Signed)
VISIT SUMMARY:  During your visit, we discussed your concerns about an upcoming long trip and the risk of blood clots. We also talked about your recent dental work and the changes in your bowel habits due to your current liquid diet. You mentioned some tenderness in your arm, which you believe is due to working at a computer all the time, and some difficulty with regular exercise. We also discussed your issues with anxiety and sleep, and your current medications.  YOUR PLAN:  -RISK OF BLOOD CLOTS: You should continue taking Xarelto as prescribed to prevent blood clots. During your trip, wear compression stockings and make sure to stop for a walk every two hours.  -PREVENTIVE CARE: You need to schedule a Pap smear with your gynecologist. We will also send you a new colonoscopy kit for at-home testing.  -HYPERTENSION: Your blood pressure is controlled with your current medications, so continue taking Valsartan, Hydrochlorothiazide, and Amlodipine as prescribed.  -HYPOTHYROIDISM: Your last thyroid check showed a need for adjustment. We will check your thyroid levels today.  -ANXIETY/INSOMNIA: Continue taking Alprazolam as prescribed for your anxiety and sleep issues.  -GASTROESOPHAGEAL REFLUX DISEASE (GERD): Since you're on a liquid diet following dental extraction, you can take Pantoprazole as needed.  -IMMUNIZATIONS: You are due for a shingles vaccine. We will administer the first dose today and plan for the second dose in 6 months.  -GENERAL HEALTH MAINTENANCE: We will check your cholesterol and Vitamin D levels today. Continue taking your over-the-counter Vitamin D supplement. Try to exercise regularly, perhaps using the gym facilities at your work. We will follow up in 6 months.  INSTRUCTIONS:  Please remember to schedule your Pap smear and to use the colonoscopy kit we will send you. Continue taking all your medications as prescribed. We will check your thyroid, cholesterol, and Vitamin D  levels today. Try to exercise regularly and we will see you for a follow-up in 6 months.

## 2022-08-23 NOTE — Progress Notes (Signed)
Subjective:     Patient ID: Sharon Rivers, female    DOB: 1970/12/28, 52 y.o.   MRN: [id number]  No chief complaint on file.   HPI  Discussed the use of AI scribe software for clinical note transcription with the patient, who gave verbal consent to proceed.  History of Present Illness           Patient presents today for complete physical.  Immunizations:due for shingrix Diet: healthy but eating soft due to recent  dental extractions Exercise: limited by the heat Wt Readings from Last 3 Encounters:  08/23/22 291 lb (132 kg)  10/12/21 (!) 302 lb (137 kg)  09/28/21 (!) 303 lb (137.4 kg)    Colonoscopy: will complete cologuard Pap Smear: due will schedule with GYN Mammogram:  07/27/22 Vision:  due, will schedule.  Dental: s/p full dental extractions  Lab Results  Component Value Date   CHOL 143 09/02/2019   HDL 40.60 09/02/2019   LDLCALC 86 09/02/2019   TRIG 81.0 09/02/2019   CHOLHDL 4 09/02/2019     Health Maintenance Due  Topic Date Due   COVID-19 Vaccine (1) Never done   Colonoscopy  Never done   PAP SMEAR-Modifier  09/02/2022    Past Medical History:  Diagnosis Date   Anxiety    Back pain    Chest pain    a. 07/2015 Myoview: EF 71%, medium size, mild intensity, partially reversible septal defect with overlying breast attenuation -> likely artifact. No significant reversible ischemia.   Constipation    Depression    Edema    feet and legs   Frequent headaches    GERD (gastroesophageal reflux disease)    History of DVT (deep vein thrombosis) 2018   Hypertension    Hypothyroidism    Insulin resistance    Joint pain    Migraines    OSA (obstructive sleep apnea) 03/17/2016   Osteoarthritis    Panic anxiety syndrome    PONV (postoperative nausea and vomiting)    Pulmonary embolism (HCC) 2022   SOB (shortness of breath)    a. 04/2016 Echo: EF 60-65%, Gr1 DD.   Swallowing difficulty     Past Surgical History:  Procedure Laterality Date    CESAREAN SECTION  2001 & 2002   DILATION AND CURETTAGE OF UTERUS N/A 09/08/2020   Procedure: DILATATION AND CURETTAGE;  Surgeon: Willodean Rosenthal, MD;  Location: [facility] OR;  Service: Gynecology;  Laterality: N/A;   ELBOW SURGERY     HYSTEROSCOPY WITH NOVASURE N/A 09/08/2020   Procedure: HYSTEROSCOPY WITH NOVASURE;  Surgeon: Willodean Rosenthal, MD;  Location: [facility] OR;  Service: Gynecology;  Laterality: N/A;   MINOR CARPAL TUNNEL     pinched nerve in elbow   WISDOM TOOTH EXTRACTION      Family History  Problem Relation Age of Onset   Hypertension Mother        Living   Arthritis Mother    Thyroid disease Mother    Heart disease Mother        MI at age 19   Heart attack Mother    Migraines Mother    Depression Mother    Obesity Mother    Congestive Heart Failure Father        ?CHF   Hypertension Maternal Grandmother    Parkinson's disease Maternal Grandmother    Alzheimer's disease Maternal Grandmother    Cancer Maternal Grandfather    Hypertension Maternal Grandfather    Hypertension Paternal Grandmother    Hypertension Paternal  Grandfather    Gout Brother    ADD / ADHD Son        x1   Other Son        #2-Unknown   Diabetes Neg Hx     Social History   Socioeconomic History   Marital status: Single    Spouse name: Not on file   Number of children: 2   Years of education: Not on file   Highest education level: Associate degree: occupational, Scientist, product/process development, or vocational program  Occupational History   Occupation: Tree surgeon    Comment: [employer]  Tobacco Use   Smoking status: Former    Current packs/day: 0.00    Types: Cigarettes    Quit date: 1992    Years since quitting: 32.5   Smokeless tobacco: Never   Tobacco comments:    quit 1992  Vaping Use   Vaping status: Never Used  Substance and Sexual Activity   Alcohol use: No    Alcohol/week: 0.0 standard drinks of alcohol   Drug use: No   Sexual activity: Yes    Partners: Male    Birth  control/protection: None  Other Topics Concern   Not on file  Social History Narrative   She has 2 children (grown). She did not retain custody of these children.   Lives with boyfriend in [place].   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: Not on [employer] Insecurity: Not on file  Transportation Needs: Not on file  Physical Activity: Not on file  Stress: Not on file  Social Connections: Unknown (07/15/2022)   Received from [facility]   Social Network    Social Network: Not on file  Intimate Partner Violence: Unknown (07/15/2022)   Received from [facility]   HITS    Physically Hurt: Not on file    Insult or Talk Down To: Not on file    Threaten Physical Harm: Not on file    Scream or Curse: Not on file    Outpatient Medications Prior to Visit  Medication Sig Dispense Refill   acetaminophen (TYLENOL) 325 MG tablet Take 2 tablets (650 mg total) by mouth every 6 (six) hours as needed for mild pain (or Fever >/= 101). 20 tablet 0   ALPRAZolam (XANAX) 0.25 MG tablet TAKE 1 TABLET BY MOUTH TWICE DAILY AS NEEDED FOR ANXIETY AND FOR SLEEP 30 tablet 0   amLODipine (NORVASC) 5 MG tablet Take 1 tablet (5 mg total) by mouth daily. 90 tablet 1   azelastine (ASTELIN) 0.1 % nasal spray Place 1 spray into both nostrils 2 (two) times daily. Use in each nostril as directed (Patient taking differently: Place 1 spray into both nostrils 2 (two) times daily as needed for allergies. Use in each nostril as directed) 30 mL 5   Cholecalciferol (VITAMIN D3) 50 MCG (2000 UT) capsule Take 1 capsule (2,000 Units total) by mouth daily.     docusate sodium (COLACE) 100 MG capsule Take 100 mg by mouth 2 (two) times daily as needed for mild constipation.     fluticasone (FLONASE) 50 MCG/ACT nasal spray Place 2 sprays into both nostrils daily. (Patient taking differently: Place 2 sprays into both nostrils daily as needed for allergies.) 16 g 6   furosemide  (LASIX) 20 MG tablet Take 1 tablet by mouth once daily 30 tablet 0   hydrochlorothiazide (MICROZIDE) 12.5 MG capsule Take 1 capsule by mouth once daily 30 capsule 0  hydrOXYzine (VISTARIL) 25 MG capsule Take 1 capsule (25 mg total) by mouth at bedtime as needed (sleep). 90 capsule 1   levothyroxine (SYNTHROID) 200 MCG tablet Take 1 tablet by mouth once daily 90 tablet 0   levothyroxine (SYNTHROID) 25 MCG tablet Take 1 tablet (25 mcg total) by mouth daily. 90 tablet 1   methocarbamol (ROBAXIN) 500 MG tablet TAKE 1 TABLET BY MOUTH EVERY 8 HOURS AS NEEDED FOR MUSCLE SPASM 20 tablet 0   montelukast (SINGULAIR) 10 MG tablet TAKE 1 TABLET BY MOUTH AT BEDTIME 90 tablet 0   Multiple Vitamins-Minerals (MULTIVITAMIN WITH MINERALS) tablet Take 1 tablet by mouth daily.     nystatin (MYCOSTATIN/NYSTOP) powder Apply 1 application topically 3 (three) times daily. (Patient taking differently: Apply 1 application  topically 3 (three) times daily as needed (skin irritation (yeast)).) 60 g 1   ondansetron (ZOFRAN) 4 MG tablet Take 1 tablet (4 mg total) by mouth every 6 (six) hours as needed for nausea. 20 tablet 0   pantoprazole (PROTONIX) 40 MG tablet Take 1 tablet (40 mg total) by mouth daily. 90 tablet 1   Polyvinyl Alcohol-Povidone PF (REFRESH) 1.4-0.6 % SOLN Place 1-2 drops into both eyes 3 (three) times daily as needed (dry/irritated eyes).     potassium chloride (KLOR-CON) 10 MEQ tablet Take 1 tablet by mouth once daily 30 tablet 0   rivaroxaban (XARELTO) 20 MG TABS tablet TAKE 1 TABLET BY MOUTH ONCE DAILY WITH SUPPER 30 tablet 5   rizatriptan (MAXALT) 10 MG tablet As needed for headache; may repeat in 2 hours if needed; max 2 per day or 8 per month 8 tablet 6   senna-docusate (SENOKOT-S) 8.6-50 MG tablet Take 1 tablet by mouth at bedtime as needed for mild constipation. 30 tablet 0   sertraline (ZOLOFT) 100 MG tablet Take 1.5 tablets (150 mg total) by mouth daily. 135 tablet 1   topiramate (TOPAMAX) 50 MG  tablet Take 1 tablet (50 mg total) by mouth 2 (two) times daily. 180 tablet 3   traZODone (DESYREL) 50 MG tablet TAKE 1 TABLET BY MOUTH AT BEDTIME 90 tablet 0   valsartan (DIOVAN) 320 MG tablet Take 1 tablet by mouth once daily 90 tablet 0   No facility-administered medications prior to visit.    Allergies  Allergen Reactions   Megace [Megestrol] Other (See Comments)    Pt is s/p PE and DVT on low dose Megace   Adhesive [Tape] Other (See Comments)    Skin Burn.   Biofreeze [Menthol (Topical Analgesic)] Other (See Comments)    Skin Burn.   Camphor Other (See Comments)    Burning sensation.   Folic Acid Hives   Lisinopril Cough   Menthol Other (See Comments)    Burning sensation.   Sulfa Antibiotics Rash    Review of Systems  Constitutional:  Positive for weight loss.  HENT:  Negative for hearing loss.   Eyes:  Negative for blurred vision.  Respiratory:  Negative for cough.   Cardiovascular:  Positive for leg swelling (mild).  Gastrointestinal:  Positive for diarrhea (loose stools while on soft diet). Negative for constipation and heartburn.  Genitourinary:  Negative for dysuria and frequency.  Musculoskeletal:  Negative for joint pain and myalgias.  Skin:  Negative for rash.  Neurological:  Positive for headaches.  Psychiatric/Behavioral:         See HPI       Objective:    Physical Exam   BP 123/67 (BP Location: Right Arm, Patient  Position: Sitting, Cuff Size: Large)   Pulse 74   Temp 97.8 F (36.6 C) (Oral)   Ht 5\' 2"  (1.575 m)   Wt 291 lb (132 kg)   SpO2 99%   BMI 53.22 kg/m  Wt Readings from Last 3 Encounters:  08/23/22 291 lb (132 kg)  10/12/21 (!) 302 lb (137 kg)  09/28/21 (!) 303 lb (137.4 kg)   Physical Exam  Constitutional: She is oriented to person, place, and time. She appears well-developed and well-nourished. No distress.  HENT:  Head: Normocephalic and atraumatic.  Right Ear: Tympanic membrane and ear canal normal.  Left Ear: Tympanic  membrane and ear canal normal.  Mouth/Throat: Oropharynx is clear and moist.  Eyes: Pupils are equal, round, and reactive to light. No scleral icterus.  Neck: Normal range of motion. No thyromegaly present.  Cardiovascular: Normal rate and regular rhythm.   No murmur heard. Pulmonary/Chest: Effort normal and breath sounds normal. No respiratory distress. He has no wheezes. She has no rales. She exhibits no tenderness.  Abdominal: Soft. Bowel sounds are normal. She exhibits no distension and no mass. There is no tenderness. There is no rebound and no guarding.  Musculoskeletal: She exhibits no edema.  Lymphadenopathy:    She has no cervical adenopathy.  Neurological: She is alert and oriented to person, place, and time. She has normal patellar reflexes. She exhibits normal muscle tone. Coordination normal.  Skin: Skin is warm and dry.  Psychiatric: She has a normal mood and affect. Her behavior is normal. Judgment and thought content normal.  Breast/Pelvic: deferred         Assessment & Plan:       Assessment & Plan:   Problem List Items Addressed This Visit       Unprioritized   Vitamin D deficiency    On vit d 2000 international units daily.      Relevant Orders   Vitamin D (25 hydroxy)   Preventative health care - Primary    Pap smear due, last mammogram done recently, colonoscopy pending. -Schedule Pap smear with gynecology. -Send new colonoscopy kit for at-home testing. Shingrix #1 today Discussed healthy diet and adding regular exercise.       Relevant Orders   Lipid panel   OSA (obstructive sleep apnea)    Maintained on cpap. Advised her to schedule follow up with sleep specialist.       Migraines    Some mild HA's due to recent dental extraction- no migraines.       Insomnia    Stable with HS xanax. Update controlled substance contract today.       Hypothyroid    Lab Results  Component Value Date   TSH 11.24 (H) 04/13/2022   Repeat tsh, maintained  on synthroid daily.       Relevant Orders   TSH   Hypertension    Stable on valsartan, hydrochlorothiazide and amlodipine.       History of DVT (deep vein thrombosis)    On Xarelto lifelong for blood clot. Planning a long trip to [place]. -Continue Xarelto as prescribed. -Use compression stockings during the trip. -Ensure frequent stops for walking every two hours during the trip.       GERD (gastroesophageal reflux disease)    Rn out of pantoprazole.       Depression    Stable, no current concerns.       Anxiety    Uses xanax prn with good improvement in symptoms.  Controlled substance contract updated today.       Relevant Orders   DRUG MONITORING, PANEL 8 WITH CONFIRMATION, URINE   Other Visit Diagnoses     Screening for colon cancer       Relevant Orders   Cologuard   Need for shingles vaccine       Relevant Orders   Zoster Recombinant (Shingrix ) (Completed)       I am having Sharon Rivers maintain her fluticasone, azelastine, acetaminophen, ondansetron, senna-docusate, nystatin, docusate sodium, Refresh, Vitamin D3, multivitamin with minerals, topiramate, rizatriptan, hydrOXYzine, sertraline, amLODipine, levothyroxine, valsartan, levothyroxine, montelukast, Xarelto, ALPRAZolam, hydrochlorothiazide, methocarbamol, traZODone, furosemide, potassium chloride, and pantoprazole.  No orders of the defined types were placed in this encounter.

## 2022-08-24 LAB — LIPID PANEL
Cholesterol: 164 mg/dL (ref 0–200)
HDL: 36.3 mg/dL — ABNORMAL LOW (ref >39.00)
LDL Cholesterol: 93 mg/dL (ref 0–99)
NonHDL: 127.79
Total CHOL/HDL Ratio: 5
Triglycerides: 172 mg/dL — ABNORMAL HIGH (ref 0.0–149.0)
VLDL: 34.4 mg/dL (ref 0.0–40.0)

## 2022-08-24 LAB — VITAMIN D 25 HYDROXY (VIT D DEFICIENCY, FRACTURES): VITD: 29.07 ng/mL — ABNORMAL LOW (ref 30.00–100.00)

## 2022-08-24 LAB — TSH: TSH: 5.21 u[IU]/mL (ref 0.35–5.50)

## 2022-08-25 ENCOUNTER — Telehealth: Payer: Self-pay | Admitting: Family

## 2022-08-25 LAB — DM TEMPLATE

## 2022-08-25 LAB — DRUG MONITORING, PANEL 8 WITH CONFIRMATION, URINE

## 2022-08-25 NOTE — Telephone Encounter (Signed)
Please schedule a lab visit for UDS.  For some reason it was cancelled at the time of her appointment.   Also, triglycerides are mildly elevated.  Please work on avoiding concentrated sweets, and limiting white carbs (rice/bread/pasta/potatoes).  Instead substitute whole grain versions with reasonable portions.  Also, iron is a little low.  I would like her to continue the multivitamin with minerals tablet.

## 2022-08-26 ENCOUNTER — Other Ambulatory Visit: Payer: Self-pay | Admitting: Family

## 2022-08-26 ENCOUNTER — Other Ambulatory Visit: Payer: Self-pay | Admitting: Cardiology

## 2022-08-26 NOTE — Telephone Encounter (Signed)
Patient notified of all results and scheduled to come back next Friday for UDS

## 2022-08-26 NOTE — Addendum Note (Signed)
Addended by: Wilford Corner on: 08/26/2022 03:39 PM   Modules accepted: Orders

## 2022-08-30 ENCOUNTER — Other Ambulatory Visit: Payer: Self-pay | Admitting: Family

## 2022-09-02 ENCOUNTER — Other Ambulatory Visit: Payer: No Typology Code available for payment source

## 2022-09-02 DIAGNOSIS — F419 Anxiety disorder, unspecified: Secondary | ICD-10-CM

## 2022-09-02 LAB — COLOGUARD: Cologuard: NEGATIVE

## 2022-09-09 ENCOUNTER — Telehealth: Payer: Self-pay | Admitting: Family

## 2022-09-09 NOTE — Telephone Encounter (Signed)
Information updated

## 2022-09-09 NOTE — Telephone Encounter (Signed)
See cologard results. Please update HCM.

## 2022-09-11 ENCOUNTER — Other Ambulatory Visit: Payer: Self-pay | Admitting: Cardiology

## 2022-09-11 ENCOUNTER — Other Ambulatory Visit: Payer: Self-pay | Admitting: Family

## 2022-09-12 MED ORDER — ALPRAZOLAM 0.25 MG PO TABS: ORAL_TABLET | ORAL | 0 refills | Status: DC

## 2022-09-13 ENCOUNTER — Telehealth: Payer: Self-pay | Admitting: Cardiology

## 2022-09-13 MED ORDER — HYDROCHLOROTHIAZIDE 12.5 MG PO CAPS: 12.5000 mg | ORAL_CAPSULE | Freq: Every day | ORAL | 0 refills | Status: DC

## 2022-09-13 NOTE — Telephone Encounter (Signed)
*  STAT* If patient is at the pharmacy, call can be transferred to refill team.   1. Which medications need to be refilled? (please list name of each medication and dose if known) hydrochlorothiazide (MICROZIDE) 12.5 MG capsule      4. Which pharmacy/location (including street and city if local pharmacy) is medication to be sent to? [facility] - [place], [place] - [address]     5. Do they need a 30 day or 90 day supply? 90  +  Pt is scheduled for 11/10/22

## 2022-09-13 NOTE — Telephone Encounter (Signed)
Pt's medication was sent to pt's pharmacy as requested. Confirmation received.  °

## 2022-09-23 ENCOUNTER — Other Ambulatory Visit: Payer: Self-pay | Admitting: Cardiology

## 2022-09-23 MED ORDER — POTASSIUM CHLORIDE ER 10 MEQ PO TBCR: 10.0000 meq | EXTENDED_RELEASE_TABLET | Freq: Every day | ORAL | 0 refills | Status: DC

## 2022-09-23 MED ORDER — FUROSEMIDE 20 MG PO TABS: 20.0000 mg | ORAL_TABLET | Freq: Every day | ORAL | 0 refills | Status: DC

## 2022-09-29 ENCOUNTER — Other Ambulatory Visit (HOSPITAL_COMMUNITY): Payer: Self-pay

## 2022-10-22 ENCOUNTER — Other Ambulatory Visit: Payer: Self-pay | Admitting: Cardiology

## 2022-10-22 ENCOUNTER — Other Ambulatory Visit: Payer: Self-pay | Admitting: Family

## 2022-11-10 ENCOUNTER — Ambulatory Visit: Payer: No Typology Code available for payment source | Attending: Cardiology | Admitting: Cardiology

## 2022-11-10 VITALS — BP 124/82 | HR 75 | Ht 62.0 in | Wt 292.0 lb

## 2022-11-10 DIAGNOSIS — E782 Mixed hyperlipidemia: Secondary | ICD-10-CM | POA: Diagnosis not present

## 2022-11-10 DIAGNOSIS — R079 Chest pain, unspecified: Secondary | ICD-10-CM | POA: Diagnosis not present

## 2022-11-10 NOTE — Patient Instructions (Signed)
Medication Instructions:  No Changes *If you need a refill on your cardiac medications before your next appointment, please call your pharmacy*   Lab Work: No Labs   Testing/Procedures: No Testing   Follow-Up: At [facility], you and your health needs are our priority.  As part of our continuing mission to provide you with exceptional heart care, we have created designated Provider Care Teams.  These Care Teams include your primary Cardiologist (physician) and Advanced Practice Providers (APPs -  Physician Assistants and Nurse Practitioners) who all work together to provide you with the care you need, when you need it.  We recommend signing up for the patient portal called "MyChart".  Sign up information is provided on this After Visit Summary.  MyChart is used to connect with patients for Virtual Visits (Telemedicine).  Patients are able to view lab/test results, encounter notes, upcoming appointments, etc.  Non-urgent messages can be sent to your provider as well.   To learn more about what you can do with MyChart, go to [email or web address].    Your next appointment:   1 year(s)  Provider:   Thomasene Ripple, DO   Other Instructions Patient is being referred to [facility]

## 2022-11-10 NOTE — Progress Notes (Signed)
Cardiology Office Note:    Date:  11/10/2022   ID:  Sharon Rivers, DOB [birth year], MRN [id number]  PCP:  Sandford Craze, NP  Cardiologist:  Thomasene Ripple, DO  Electrophysiologist:  None   Referring MD: Sandford Craze, NP   " I am really really short of breath"  History of Present Illness:    Sharon Rivers is a 52 y.o. female with a hx of DVT and pulmonary o embolism on Xarelto, morbid obesity, hypertension, OSA on CPAP here today for follow-up visit.  She reports that she has been still continuing to experience shortness of breath.  We have had an echocardiogram which normal coronary CT scan with evidence of coronary artery disease.  Past Medical History:  Diagnosis Date   Anxiety    Back pain    Chest pain    a. 07/2015 Myoview: EF 71%, medium size, mild intensity, partially reversible septal defect with overlying breast attenuation -> likely artifact. No significant reversible ischemia.   Constipation    Depression    Edema    feet and legs   Frequent headaches    GERD (gastroesophageal reflux disease)    History of DVT (deep vein thrombosis) 2018   Hypertension    Hypothyroidism    Insulin resistance    Joint pain    Migraines    OSA (obstructive sleep apnea) 03/17/2016   Osteoarthritis    Panic anxiety syndrome    PONV (postoperative nausea and vomiting)    Pulmonary embolism (HCC) 2022   SOB (shortness of breath)    a. 04/2016 Echo: EF 60-65%, Gr1 DD.   Swallowing difficulty     Past Surgical History:  Procedure Laterality Date   CESAREAN SECTION  2001 & 2002   DILATION AND CURETTAGE OF UTERUS N/A 09/08/2020   Procedure: DILATATION AND CURETTAGE;  Surgeon: Willodean Rosenthal, MD;  Location: [facility] OR;  Service: Gynecology;  Laterality: N/A;   ELBOW SURGERY     HYSTEROSCOPY WITH NOVASURE N/A 09/08/2020   Procedure: HYSTEROSCOPY WITH NOVASURE;  Surgeon: Willodean Rosenthal, MD;  Location: [facility] OR;  Service: Gynecology;  Laterality: N/A;   MINOR  CARPAL TUNNEL     pinched nerve in elbow   WISDOM TOOTH EXTRACTION      Current Medications: No outpatient medications have been marked as taking for the 11/10/22 encounter (Office Visit) with Thomasene Ripple, DO.     Allergies:   Megace [megestrol], Adhesive [tape], Biofreeze [menthol (topical analgesic)], Camphor, Folic acid, Lisinopril, Menthol, and Sulfa antibiotics   Social History   Socioeconomic History   Marital status: Single    Spouse name: Not on file   Number of children: 2   Years of education: Not on file   Highest education level: Associate degree: occupational, Scientist, product/process development, or vocational program  Occupational History   Occupation: Tree surgeon    Comment: [employer]  Tobacco Use   Smoking status: Former    Current packs/day: 0.00    Types: Cigarettes    Quit date: 1992    Years since quitting: 32.7   Smokeless tobacco: Never   Tobacco comments:    quit 1992  Vaping Use   Vaping status: Never Used  Substance and Sexual Activity   Alcohol use: No    Alcohol/week: 0.0 standard drinks of alcohol   Drug use: No   Sexual activity: Yes    Partners: Male    Birth control/protection: None  Other Topics Concern   Not on file  Social History Narrative   She has  2 children (grown). She did not retain custody of these children.   Lives with boyfriend in [place].   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: Not on [employer] Insecurity: Not on file  Transportation Needs: Not on file  Physical Activity: Not on file  Stress: Not on file  Social Connections: Unknown (07/15/2022)   [facility]   Social Network    Social Network: Not on file     Family History: The patient's family history includes ADD / ADHD in her son; Alzheimer's disease in her maternal grandmother; Arthritis in her mother; Cancer in her maternal grandfather; Congestive Heart Failure in her father; Depression in her  mother; Gout in her brother; Heart attack in her mother; Heart disease in her mother; Hypertension in her maternal grandfather, maternal grandmother, mother, paternal grandfather, and paternal grandmother; Migraines in her mother; Obesity in her mother; Other in her son; Parkinson's disease in her maternal grandmother; Thyroid disease in her mother. There is no history of Diabetes.  ROS:   Review of Systems  Constitution: Negative for decreased appetite, fever and weight gain.  HENT: Negative for congestion, ear discharge, hoarse voice and sore throat.   Eyes: Negative for discharge, redness, vision loss in right eye and visual halos.  Cardiovascular: Negative for chest pain, dyspnea on exertion, leg swelling, orthopnea and palpitations.  Respiratory: Negative for cough, hemoptysis, shortness of breath and snoring.   Endocrine: Negative for heat intolerance and polyphagia.  Hematologic/Lymphatic: Negative for bleeding problem. Does not bruise/bleed easily.  Skin: Negative for flushing, nail changes, rash and suspicious lesions.  Musculoskeletal: Negative for arthritis, joint pain, muscle cramps, myalgias, neck pain and stiffness.  Gastrointestinal: Negative for abdominal pain, bowel incontinence, diarrhea and excessive appetite.  Genitourinary: Negative for decreased libido, genital sores and incomplete emptying.  Neurological: Negative for brief paralysis, focal weakness, headaches and loss of balance.  Psychiatric/Behavioral: Negative for altered mental status, depression and suicidal ideas.  Allergic/Immunologic: Negative for HIV exposure and persistent infections.    EKGs/Labs/Other Studies Reviewed:    The following studies were reviewed today:   EKG:  The ekg ordered today demonstrates   Recent Labs: 04/13/2022: BUN 14; Creatinine, Ser 1.07; Potassium 3.8; Sodium 140 08/23/2022: TSH 5.21  Recent Lipid Panel    Component Value Date/Time   CHOL 164 08/23/2022 1718   CHOL 158  03/08/2017 1108   TRIG 172.0 (H) 08/23/2022 1718   HDL 36.30 (L) 08/23/2022 1718   HDL 45 03/08/2017 1108   CHOLHDL 5 08/23/2022 1718   VLDL 34.4 08/23/2022 1718   LDLCALC 93 08/23/2022 1718   LDLCALC 100 (H) 03/08/2017 1108    Physical Exam:    VS:  BP 124/82   Pulse 75   Ht 5\' 2"  (1.575 m)   Wt 292 lb (132.5 kg)   SpO2 98%   BMI 53.41 kg/m     Wt Readings from Last 3 Encounters:  11/10/22 292 lb (132.5 kg)  08/23/22 291 lb (132 kg)  10/12/21 (!) 302 lb (137 kg)     GEN: Well nourished, well developed in no acute distress HEENT: Normal NECK: No JVD; No carotid bruits LYMPHATICS: No lymphadenopathy CARDIAC: S1S2 noted,RRR, no murmurs, rubs, gallops RESPIRATORY:  Clear to auscultation without rales, wheezing or rhonchi  ABDOMEN: Soft, non-tender, non-distended, +bowel sounds, no guarding. EXTREMITIES: No edema, No cyanosis, no clubbing MUSCULOSKELETAL:  No deformity  SKIN: Warm and dry NEUROLOGIC:  Alert and oriented x 3, non-focal PSYCHIATRIC:  Normal affect, good insight  ASSESSMENT:    1. Chest pain of uncertain etiology   2. Morbid obesity (HCC)   3. Mixed hyperlipidemia    PLAN:    So far cardiac testing have been normal, no cardiac etiology of her shortness of breath.  I do believe that morbid obesity and deconditioning may be playing a great role here.  I shared this with the patient.  She is agreeable to be evaluated in our medical weight loss program.  Blood pressure is acceptable, continue with current antihypertensive regimen.  Hyperlipidemia - continue   The patient is in agreement with the above plan. The patient left the office in stable condition.  The patient will follow up in   Medication Adjustments/Labs and Tests Ordered: Current medicines are reviewed at length with the patient today.  Concerns regarding medicines are outlined above.  Orders Placed This Encounter  Procedures   AMB Referral to [facility] Pharm-D   EKG 12-Lead   No orders  of the defined types were placed in this encounter.   Patient Instructions  Medication Instructions:  No Changes *If you need a refill on your cardiac medications before your next appointment, please call your pharmacy*   Lab Work: No Labs   Testing/Procedures: No Testing   Follow-Up: At [facility], you and your health needs are our priority.  As part of our continuing mission to provide you with exceptional heart care, we have created designated Provider Care Teams.  These Care Teams include your primary Cardiologist (physician) and Advanced Practice Providers (APPs -  Physician Assistants and Nurse Practitioners) who all work together to provide you with the care you need, when you need it.  We recommend signing up for the patient portal called "MyChart".  Sign up information is provided on this After Visit Summary.  MyChart is used to connect with patients for Virtual Visits (Telemedicine).  Patients are able to view lab/test results, encounter notes, upcoming appointments, etc.  Non-urgent messages can be sent to your provider as well.   To learn more about what you can do with MyChart, go to [email or web address].    Your next appointment:   1 year(s)  Provider:   Thomasene Ripple, DO   Other Instructions Patient is being referred to [facility]   Adopting a Healthy Lifestyle.  Know what a healthy weight is for you (roughly BMI <25) and aim to maintain this   Aim for 7+ servings of fruits and vegetables daily   65-80+ fluid ounces of water or unsweet tea for healthy kidneys   Limit to max 1 drink of alcohol per day; avoid smoking/tobacco   Limit animal fats in diet for cholesterol and heart health - choose grass fed whenever available   Avoid highly processed foods, and foods high in saturated/trans fats   Aim for low stress - take time to unwind and care for your mental health   Aim for 150 min of moderate intensity exercise weekly for heart  health, and weights twice weekly for bone health   Aim for 7-9 hours of sleep daily   When it comes to diets, agreement about the perfect plan isnt easy to find, even among the experts. Experts at the [facility] of [employer] developed an idea known as the Healthy Eating Plate. Just imagine a plate divided into logical, healthy portions.   The emphasis is on diet quality:   Load up on vegetables  and fruits - one-half of your plate: Aim for color and variety, and remember that potatoes dont count.   Go for whole grains - one-quarter of your plate: Whole wheat, barley, wheat berries, quinoa, oats, brown rice, and foods made with them. If you want pasta, go with whole wheat pasta.   Protein power - one-quarter of your plate: Fish, chicken, beans, and nuts are all healthy, versatile protein sources. Limit red meat.   The diet, however, does go beyond the plate, offering a few other suggestions.   Use healthy plant oils, such as olive, canola, soy, corn, sunflower and peanut. Check the labels, and avoid partially hydrogenated oil, which have unhealthy trans fats.   If youre thirsty, drink water. Coffee and tea are good in moderation, but skip sugary drinks and limit milk and dairy products to one or two daily servings.   The type of carbohydrate in the diet is more important than the amount. Some sources of carbohydrates, such as vegetables, fruits, whole grains, and beans-are healthier than others.   Finally, stay active  Signed, Thomasene Ripple, DO  11/10/2022 5:10 PM    [facility]

## 2022-11-12 ENCOUNTER — Other Ambulatory Visit: Payer: Self-pay | Admitting: Family

## 2022-11-17 ENCOUNTER — Encounter: Payer: Self-pay | Admitting: Family

## 2022-11-24 NOTE — Telephone Encounter (Signed)
My chart message sent to pt.

## 2022-11-25 ENCOUNTER — Telehealth: Payer: Self-pay | Admitting: Pharmacist

## 2022-11-25 ENCOUNTER — Other Ambulatory Visit (HOSPITAL_COMMUNITY): Payer: Self-pay

## 2022-11-25 ENCOUNTER — Telehealth: Payer: Self-pay | Admitting: Pharmacy Technician

## 2022-11-25 ENCOUNTER — Encounter: Payer: Self-pay | Admitting: Student

## 2022-11-25 ENCOUNTER — Ambulatory Visit: Payer: No Typology Code available for payment source | Attending: Internal Medicine | Admitting: Student

## 2022-11-25 NOTE — Telephone Encounter (Signed)
Pharmacy Patient Advocate Encounter   Received notification from Pt Calls Messages that prior authorization for wegovy is required/requested.   Insurance verification completed.   The patient is insured through [facility] .   Per test claim: PA required; PA submitted to [facility] via CoverMyMeds Key/confirmation #/EOC [id number] Status is pending

## 2022-11-25 NOTE — Patient Instructions (Addendum)
Meal Planning: Meal planning is the key to setting you up for success. Here are some examples of healthy meal options. Breakfast   Option 1:  Omelette with vegetables (1 egg, spinach, mushrooms, or other vegetable of your choice), 2 slices whole-grain toast, tip of thumb size butter or soft margarine,  cup low-fat milk or yogurt  Option 2: steel-cut rolled oats (? cup dry), 1 tbsp peanut butter added to cooked oats,  cup low-fat milk. Option 3: 2 slices whole-grain or rye toast with avocado spread ( small avocado mased with herbs and pepper to taste), 1 poached egg or [place] up (cooked to your liking) Option 4:  cup plain 0% [place] yogurt topped with  cup berries and  cup walnuts or almonds, 2 slices whole-grain or rye toast, tip of thumb size soft margarine/butter Lunch:  Option 1: 2 cups red lentil soup, green salad with 1 tbsp homemade vinaigrette (extra virgin olive oil and vinegar of choice plus spices) Option 2: 3 oz. roasted chicken, 2 slices whole-grain bread, 2 tsp mayonnaise, mustard, lettuce, tomato if desired, 1 fruit (example: medium-sized apple or small pear) Option 3: 3 oz. tuna packed in water, 1 whole-wheat pita (6 inch), 2 tsp mayonnaise, lettuce, tomato, or other non-starchy vegetable of your choice, 1 fruit (example: medium-sized apple or small pear) Option 4: 1 serving of garden veggie buddha bowl with lentils and tahini sauce and 1 cup berries topped with  cup plain 0% Greek yogurt Dinner:  Option 1: 1 serving roasted cauliflower salad, 3-4 oz.  grilled or baked pork loin chop, 1/2 cup mashed potato, or brown rice or quinoa  Option 2: 1 serving fish (baked, grilled or air fried), green salad, 1 tbsp homemade vinaigrette,  cup cooked couscous Option 3: 1 cup cooked whole grained pasta (example: spaghetti, spirals, macaroni),  cup favorite pasta sauce (preferably homemade), 3-4 oz.  grilled or baked chicken, green salad, 1 tbsp homemade vinaigrette Option 4: 1 serving  oven roasted salmon,  cup mashed sweet potato or couscous or brown rice or quinoa, broccoli (steamed or roasted) Healthy snacks:  Carrots or celery with 1 tbsp of hummus  1 medium-sized fruit (apple or orange) 1 cup plain 0% [place] yogurt with  cup berries Half apple, sliced, with 1 tbsp (15 mL) peanut or almond butter  Dining out:  Eating away from home has become a part of many people's lifestyle. Making healthy choices when you are eating out is important too. Portion size is an important part of healthy choices. Most branded fast-food places provide calories, sodium, and fat content for their menu items. www.calorieking.com would be great resource to find nutrition facts for your favorite brands and fast-food restaurants. Company specific website can be Chief Technology Officer for nutrition information for their items. (e.g. www.mcdonalds.com or www.nutritionix.com/[place]/menu/premium)  Here are some tips to help you make wise food choices when you are dining out.  Chose more often  Avoid   Beverages  Water, low fat milk  Sugar-free/diet drinks  Unsweet tea or coffee Milkshakes, fruit drinks, regular pop Alcohol, specialty drinks (e.g. iced cappuccino)  Fast food Garden salad Mini subs, pita sandwiches ect with extra vegetables plain burgers, grilled chicken Vegetarian or cheese pizza with whole-grain crust Burgers/sandwiches with bacon, cheese, and high-fat sauces [place] fries, fried chicken, fried fish, poutine, hash browns Pizza with processed meats  Starters Raw vegetables, salads (garden, spinach, fruit) clear or vegetable soups Seafood cocktail Whole-grain breads and rolls Salads with high-fat dressings or toppings   Creamy  soups  Wings, egg rolls  onion rings, nachos  White or garlic bread  Main courses Grains & Starches (amount equal to  of your plate)   Oatmeal, high-fiber/lower-sugar cereals  Whole-grain breads, rice, pasta, barley, couscous   Sweet potatoes Sugary,  low-fiber cereals  Large bagels, muffins, croissants, white bread  [place] fries, hash browns, fried rice   Meat and alternative (amount equal to  of your plate)   Lean meats, poultry, fish, eggs, low-fat cheese  Tofu, vegetable protein Legumes (e.g. lentils, chickpeas, beans) High-salt and/or high-fat meats (e.g. ribs, wings, sausages, wieners, processed lunch meats, imposter meats)   Vegetables (amount equal to  of your plate)   Salads ([place], garden, spinach), plain vegetables  Vegetables on sandwiches ect Salads with creamy, high-fat dressings and toppings like bacon bits, croutons, cheese  Desserts Fresh fruit, frozen yogourt, skim milk latte Cakes, pies, pastries, ice cream, cheesecake

## 2022-11-25 NOTE — Progress Notes (Unsigned)
Patient ID: Sharon Rivers                 DOB: 03/25/1970                    MRN: [id number]     HPI: Sharon Rivers is a 52 y.o. female patient referred to pharmacy clinic by Dr.Tobb to initiate GLP1-RA therapy. PMH is significant for hx of DVT and pulmonary o embolism on Xarelto, morbid obesity, hypertension, OSA on CPAP. Most recent BMI 53.39.  Baseline weight and BMI:  292 lbs, 53.39 Current weight and BMI: 292 lbs, 53.39 Current meds that affect weight: levothyroxine , topiramate - may cause weight loss  Patient  presented today for weight loss consult. Patient has tried Healthy weight and wellness program and following weight watcher not been able to achieve any meaningful weight loss. Due to back issue she is unable to do exercise for more than 5 min. Reports she was on metformin for pre- diabetes but she went off of that. We discussed GLP1 dose titration common side effects and importance of lifestyle changes along with medication to get good result.    Diet:  Breakfast: oats  Lunch: peanut butter sandwich  Dinner: chicken - baked  vegetables, steamed carrots and    Exercise: walks 5 min and she get short breath and back start hurting more, tried chair exercise but back hurts more   Family History:  Relation Problem Comments  Mother (Alive) Arthritis   Depression   Heart attack   Heart disease MI at age 14  Hypertension Living  Migraines   Obesity   Thyroid disease     Father (Deceased) Congestive Heart Failure ?CHF    Brother Metallurgist) Gout     Maternal Grandmother (Deceased) Alzheimer's disease   Hypertension   Parkinson's disease     Maternal Grandfather (Deceased) Cancer   Hypertension     Paternal Grandmother (Deceased) Hypertension     Paternal Grandfather (Deceased) Hypertension     Son Metallurgist) ADD / ADHD x1    Son (Alive) Other #2-Unknown     Social History:  Alcohol: none  Smoking: quit 15 years ago  Labs: Lab Results  Component Value Date    HGBA1C 4.8 05/16/2020    Wt Readings from Last 1 Encounters:  11/10/22 292 lb (132.5 kg)    BP Readings from Last 1 Encounters:  11/10/22 124/82   Pulse Readings from Last 1 Encounters:  11/10/22 75       Component Value Date/Time   CHOL 164 08/23/2022 1718   CHOL 158 03/08/2017 1108   TRIG 172.0 (H) 08/23/2022 1718   HDL 36.30 (L) 08/23/2022 1718   HDL 45 03/08/2017 1108   CHOLHDL 5 08/23/2022 1718   VLDL 34.4 08/23/2022 1718   LDLCALC 93 08/23/2022 1718   LDLCALC 100 (H) 03/08/2017 1108    Past Medical History:  Diagnosis Date   Anxiety    Back pain    Chest pain    a. 07/2015 Myoview: EF 71%, medium size, mild intensity, partially reversible septal defect with overlying breast attenuation -> likely artifact. No significant reversible ischemia.   Constipation    Depression    Edema    feet and legs   Frequent headaches    GERD (gastroesophageal reflux disease)    History of DVT (deep vein thrombosis) 2018   Hypertension    Hypothyroidism    Insulin resistance    Joint pain    Migraines  OSA (obstructive sleep apnea) 03/17/2016   Osteoarthritis    Panic anxiety syndrome    PONV (postoperative nausea and vomiting)    Pulmonary embolism (HCC) 2022   SOB (shortness of breath)    a. 04/2016 Echo: EF 60-65%, Gr1 DD.   Swallowing difficulty     Current Outpatient Medications on File Prior to Visit  Medication Sig Dispense Refill   acetaminophen (TYLENOL) 325 MG tablet Take 2 tablets (650 mg total) by mouth every 6 (six) hours as needed for mild pain (or Fever >/= 101). 20 tablet 0   ALPRAZolam (XANAX) 0.25 MG tablet TAKE 1 TABLET BY MOUTH TWICE DAILY AS NEEDED FOR ANXIETY AND FOR SLEEP 30 tablet 0   amLODipine (NORVASC) 5 MG tablet Take 1 tablet by mouth once daily 90 tablet 0   azelastine (ASTELIN) 0.1 % nasal spray Place 1 spray into both nostrils 2 (two) times daily. Use in each nostril as directed (Patient taking differently: Place 1 spray into both nostrils  2 (two) times daily as needed for allergies. Use in each nostril as directed) 30 mL 5   Cholecalciferol (VITAMIN D3) 50 MCG (2000 UT) capsule Take 1 capsule (2,000 Units total) by mouth daily.     docusate sodium (COLACE) 100 MG capsule Take 100 mg by mouth 2 (two) times daily as needed for mild constipation.     fluticasone (FLONASE) 50 MCG/ACT nasal spray Place 2 sprays into both nostrils daily. (Patient taking differently: Place 2 sprays into both nostrils daily as needed for allergies.) 16 g 6   furosemide (LASIX) 20 MG tablet Take 1 tablet by mouth once daily 30 tablet 1   hydrochlorothiazide (MICROZIDE) 12.5 MG capsule Take 1 capsule by mouth once daily 30 capsule 1   hydrOXYzine (VISTARIL) 25 MG capsule TAKE 1 CAPSULE BY MOUTH AT BEDTIME AS NEEDED FOR SLEEP 90 capsule 0   levothyroxine (SYNTHROID) 200 MCG tablet Take 1 tablet by mouth once daily 90 tablet 0   levothyroxine (SYNTHROID) 25 MCG tablet Take 1 tablet (25 mcg total) by mouth daily. 90 tablet 1   methocarbamol (ROBAXIN) 500 MG tablet TAKE 1 TABLET BY MOUTH EVERY 8 HOURS AS NEEDED FOR MUSCLE SPASM 20 tablet 0   montelukast (SINGULAIR) 10 MG tablet TAKE 1 TABLET BY MOUTH AT BEDTIME 90 tablet 0   Multiple Vitamins-Minerals (MULTIVITAMIN WITH MINERALS) tablet Take 1 tablet by mouth daily.     nystatin (MYCOSTATIN/NYSTOP) powder Apply 1 application topically 3 (three) times daily. (Patient taking differently: Apply 1 application  topically 3 (three) times daily as needed (skin irritation (yeast)).) 60 g 1   ondansetron (ZOFRAN) 4 MG tablet Take 1 tablet (4 mg total) by mouth every 6 (six) hours as needed for nausea. 20 tablet 0   pantoprazole (PROTONIX) 40 MG tablet Take 1 tablet (40 mg total) by mouth daily. (Patient not taking: Reported on 11/10/2022) 90 tablet 1   Polyvinyl Alcohol-Povidone PF (REFRESH) 1.4-0.6 % SOLN Place 1-2 drops into both eyes 3 (three) times daily as needed (dry/irritated eyes).     potassium chloride (KLOR-CON)  10 MEQ tablet TAKE 1 TABLET BY MOUTH ONCE DAILY . APPOINTMENT REQUIRED FOR FUTURE REFILLS 30 tablet 1   rivaroxaban (XARELTO) 20 MG TABS tablet TAKE 1 TABLET BY MOUTH ONCE DAILY WITH SUPPER 30 tablet 5   rizatriptan (MAXALT) 10 MG tablet As needed for headache; may repeat in 2 hours if needed; max 2 per day or 8 per month 8 tablet 6   senna-docusate (SENOKOT-S)  8.6-50 MG tablet Take 1 tablet by mouth at bedtime as needed for mild constipation. 30 tablet 0   sertraline (ZOLOFT) 100 MG tablet TAKE 1 & 1/2 (ONE & ONE-HALF) TABLETS BY MOUTH ONCE DAILY (Patient taking differently: Take 100 mg by mouth daily. One tablet) 135 tablet 0   topiramate (TOPAMAX) 50 MG tablet Take 1 tablet (50 mg total) by mouth 2 (two) times daily. 180 tablet 3   traZODone (DESYREL) 50 MG tablet TAKE 1 TABLET BY MOUTH AT BEDTIME 90 tablet 0   valsartan (DIOVAN) 320 MG tablet Take 1 tablet by mouth once daily 90 tablet 0   No current facility-administered medications on file prior to visit.    Allergies  Allergen Reactions   Megace [Megestrol] Other (See Comments)    Pt is s/p PE and DVT on low dose Megace   Adhesive [Tape] Other (See Comments)    Skin Burn.   Biofreeze [Menthol (Topical Analgesic)] Other (See Comments)    Skin Burn.   Camphor Other (See Comments)    Burning sensation.   Folic Acid Hives   Lisinopril Cough   Menthol Other (See Comments)    Burning sensation.   Sulfa Antibiotics Rash     Assessment/Plan:  1. Weight loss - Patient has not met goal of at least 5% of body weight loss with comprehensive lifestyle modifications alone in the past 3-6 months. Pharmacotherapy is appropriate to pursue as augmentation. Will start coverage assessment for Wegovy or Zepbound. A1c is on file from 2 years back will get updated lab today. Confirmed patient has no personal or family history of medullary thyroid carcinoma (MTC) or Multiple Endocrine Neoplasia syndrome type 2 (MEN 2). Injection technique reviewed at  today's visit.  Advised patient on common side effects including nausea, diarrhea, dyspepsia, decreased appetite, and fatigue. Counseled patient on reducing meal size and how to titrate medication to minimize side effects. Counseled patient to call if intolerable side effects or if experiencing dehydration, abdominal pain, or dizziness. Patient will adhere to dietary modifications and will target at least 150 minutes of moderate intensity exercise weekly.   Follow up in 1 month via telephone for tolerability update and dose titration.

## 2022-11-26 LAB — HEMOGLOBIN A1C
Est. average glucose Bld gHb Est-mCnc: 108 mg/dL
Hgb A1c MFr Bld: 5.4 % (ref 4.8–5.6)

## 2022-11-28 ENCOUNTER — Other Ambulatory Visit (HOSPITAL_COMMUNITY): Payer: Self-pay

## 2022-12-05 ENCOUNTER — Encounter: Payer: Self-pay | Admitting: Cardiology

## 2022-12-05 NOTE — Telephone Encounter (Signed)
PA for [facility] Denied patient has been informed on Dec 05, 2022

## 2022-12-05 NOTE — Telephone Encounter (Signed)
Pharmacy Patient Advocate Encounter  Received notification from [facility] that Prior Authorization for wegovy has been DENIED.  Full denial letter will be uploaded to the media tab. See denial reason below.   PA #/Case ID/Reference #: [id number]

## 2022-12-05 NOTE — Telephone Encounter (Signed)
Patient made aware about the denial

## 2022-12-07 ENCOUNTER — Encounter: Payer: Self-pay | Admitting: Family

## 2022-12-07 ENCOUNTER — Other Ambulatory Visit (HOSPITAL_COMMUNITY): Payer: Self-pay

## 2022-12-07 ENCOUNTER — Telehealth: Payer: Self-pay | Admitting: Pharmacy Technician

## 2022-12-07 NOTE — Telephone Encounter (Signed)
Pharmacy Patient Advocate Encounter   Received notification from Pt Calls Messages that prior authorization for wegovy is required/requested.   Insurance verification completed.   The patient is insured through  [employer]  .   Per test claim: PA required; PA submitted to above mentioned insurance via Prompt PA Key/confirmation #/[id number]   Status is pending

## 2022-12-07 NOTE — Telephone Encounter (Signed)
Pharmacy Patient Advocate Encounter  Received notification from  [employer]  that Prior Authorization for wegovy has been DENIED.  Full denial letter will be uploaded to the media tab. See denial reason below.   PA #/Case ID/Reference #: [id number]

## 2022-12-08 ENCOUNTER — Telehealth: Payer: Self-pay | Admitting: Pharmacy Technician

## 2022-12-08 ENCOUNTER — Other Ambulatory Visit (HOSPITAL_COMMUNITY): Payer: Self-pay

## 2022-12-08 NOTE — Telephone Encounter (Signed)
Sent patient a message explaining [employer] is under the [employer] plan for [employer]

## 2022-12-13 ENCOUNTER — Ambulatory Visit: Payer: No Typology Code available for payment source | Admitting: Family

## 2022-12-13 VITALS — BP 130/70 | HR 70 | Temp 98.5°F | Resp 16 | Ht 62.0 in | Wt 301.0 lb

## 2022-12-13 DIAGNOSIS — H9202 Otalgia, left ear: Secondary | ICD-10-CM | POA: Insufficient documentation

## 2022-12-13 DIAGNOSIS — R0981 Nasal congestion: Secondary | ICD-10-CM | POA: Diagnosis not present

## 2022-12-13 NOTE — Progress Notes (Signed)
[[[[  Subjective:     Patient ID: Sharon Rivers, female    DOB: 1970-06-11, 52 y.o.   MRN: [id number]  Chief Complaint  Patient presents with   Ear Pain    Patient complains of left ear pain on and off    HPI  Discussed the use of AI scribe software for clinical note transcription with the patient, who gave verbal consent to proceed.  History of Present Illness   The patient presents with a year-long history of sharp, stabbing ear pain that has recently increased in frequency. The pain is localized to the ear and radiates to the side of the head. The patient denies any changes in hearing and has stopped using in-ear Bluetooth devices. The pain can be temporarily alleviated by applying pressure to the ear. The patient denies any fever or drainage but reports recent sinus congestion which was partially relieved by Tylenol severe sinus. The patient also reports taking an allergy pill and using a nasal spray as needed.          Health Maintenance Due  Topic Date Due   COVID-19 Vaccine (1) Never done   Zoster Vaccines- Shingrix (2 of 2) 10/18/2022    Past Medical History:  Diagnosis Date   Anxiety    Back pain    Chest pain    a. 07/2015 Myoview: EF 71%, medium size, mild intensity, partially reversible septal defect with overlying breast attenuation -> likely artifact. No significant reversible ischemia.   Constipation    Depression    Edema    feet and legs   Frequent headaches    GERD (gastroesophageal reflux disease)    History of DVT (deep vein thrombosis) 2018   Hypertension    Hypothyroidism    Insulin resistance    Joint pain    Migraines    OSA (obstructive sleep apnea) 03/17/2016   Osteoarthritis    Panic anxiety syndrome    PONV (postoperative nausea and vomiting)    Pulmonary embolism (HCC) 2022   SOB (shortness of breath)    a. 04/2016 Echo: EF 60-65%, Gr1 DD.   Swallowing difficulty     Past Surgical History:  Procedure Laterality Date   CESAREAN  SECTION  2001 & 2002   DILATION AND CURETTAGE OF UTERUS N/A 09/08/2020   Procedure: DILATATION AND CURETTAGE;  Surgeon: Willodean Rosenthal, MD;  Location: [facility] OR;  Service: Gynecology;  Laterality: N/A;   ELBOW SURGERY     HYSTEROSCOPY WITH NOVASURE N/A 09/08/2020   Procedure: HYSTEROSCOPY WITH NOVASURE;  Surgeon: Willodean Rosenthal, MD;  Location: [facility] OR;  Service: Gynecology;  Laterality: N/A;   MINOR CARPAL TUNNEL     pinched nerve in elbow   WISDOM TOOTH EXTRACTION      Family History  Problem Relation Age of Onset   Hypertension Mother        Living   Arthritis Mother    Thyroid disease Mother    Heart disease Mother        MI at age 56   Heart attack Mother    Migraines Mother    Depression Mother    Obesity Mother    Congestive Heart Failure Father        ?CHF   Hypertension Maternal Grandmother    Parkinson's disease Maternal Grandmother    Alzheimer's disease Maternal Grandmother    Cancer Maternal Grandfather    Hypertension Maternal Grandfather    Hypertension Paternal Grandmother    Hypertension Paternal Grandfather    Gout  Brother    ADD / ADHD Son        x1   Other Son        #2-Unknown   Diabetes Neg Hx     Social History   Socioeconomic History   Marital status: Single    Spouse name: Not on file   Number of children: 2   Years of education: Not on file   Highest education level: Associate degree: occupational, Scientist, product/process development, or vocational program  Occupational History   Occupation: Tree surgeon    Comment: [employer]  Tobacco Use   Smoking status: Former    Current packs/day: 0.00    Types: Cigarettes    Quit date: 1992    Years since quitting: 32.8   Smokeless tobacco: Never   Tobacco comments:    quit 1992  Vaping Use   Vaping status: Never Used  Substance and Sexual Activity   Alcohol use: No    Alcohol/week: 0.0 standard drinks of alcohol   Drug use: No   Sexual activity: Yes    Partners: Male    Birth control/protection: None   Other Topics Concern   Not on file  Social History Narrative   She has 2 children (grown). She did not retain custody of these children.   Lives with boyfriend in [place].   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 [employer]: Low Risk  (12/07/2022)   Overall Financial Resource Strain (CARDIA)    Difficulty of Paying Living Expenses: Not very hard  Food Insecurity: No Food Insecurity (12/07/2022)   Hunger Vital Sign    Worried About Running Out of Food in the Last Year: Never true    Ran Out of Food in the Last Year: Never true  Transportation Needs: No Transportation Needs (12/07/2022)   PRAPARE - Administrator, Civil Service (Medical): No    Lack of Transportation (Non-Medical): No  Physical Activity: Unknown (12/07/2022)   Exercise Vital Sign    Days of Exercise per Week: 0 days    Minutes of Exercise per Session: Not on file  Stress: No Stress Concern Present (12/07/2022)   [employer] of Occupational Health - Occupational Stress Questionnaire    Feeling of Stress : Only a little  Social Connections: Moderately Integrated (12/07/2022)   Social Connection and Isolation Panel [NHANES]    Frequency of Communication with Friends and Family: More than three times a week    Frequency of Social Gatherings with Friends and Family: More than three times a week    Attends Religious Services: 1 to 4 times per year    Active Member of [employer] or Organizations: No    Attends Engineer, structural: Not on file    Marital Status: Living with partner  Intimate Partner Violence: Unknown (07/15/2022)   Received from [employer]   HITS    Physically Hurt: Not on file    Insult or Talk Down To: Not on file    Threaten Physical Harm: Not on file    Scream or Curse: Not on file    Outpatient Medications Prior to Visit  Medication Sig Dispense Refill   acetaminophen (TYLENOL) 325 MG tablet Take 2 tablets (650 mg  total) by mouth every 6 (six) hours as needed for mild pain (or Fever >/= 101). 20 tablet 0   ALPRAZolam (XANAX) 0.25 MG tablet TAKE 1 TABLET BY MOUTH TWICE DAILY AS NEEDED FOR ANXIETY AND FOR  SLEEP 30 tablet 0   amLODipine (NORVASC) 5 MG tablet Take 1 tablet by mouth once daily 90 tablet 0   azelastine (ASTELIN) 0.1 % nasal spray Place 1 spray into both nostrils 2 (two) times daily. Use in each nostril as directed (Patient taking differently: Place 1 spray into both nostrils 2 (two) times daily as needed for allergies. Use in each nostril as directed) 30 mL 5   Cholecalciferol (VITAMIN D3) 50 MCG (2000 UT) capsule Take 1 capsule (2,000 Units total) by mouth daily.     docusate sodium (COLACE) 100 MG capsule Take 100 mg by mouth 2 (two) times daily as needed for mild constipation.     fluticasone (FLONASE) 50 MCG/ACT nasal spray Place 2 sprays into both nostrils daily. (Patient taking differently: Place 2 sprays into both nostrils daily as needed for allergies.) 16 g 6   furosemide (LASIX) 20 MG tablet Take 1 tablet by mouth once daily 30 tablet 1   hydrochlorothiazide (MICROZIDE) 12.5 MG capsule Take 1 capsule by mouth once daily 30 capsule 1   hydrOXYzine (VISTARIL) 25 MG capsule TAKE 1 CAPSULE BY MOUTH AT BEDTIME AS NEEDED FOR SLEEP 90 capsule 0   levothyroxine (SYNTHROID) 200 MCG tablet Take 1 tablet by mouth once daily 90 tablet 0   levothyroxine (SYNTHROID) 25 MCG tablet Take 1 tablet (25 mcg total) by mouth daily. 90 tablet 1   methocarbamol (ROBAXIN) 500 MG tablet TAKE 1 TABLET BY MOUTH EVERY 8 HOURS AS NEEDED FOR MUSCLE SPASM 20 tablet 0   montelukast (SINGULAIR) 10 MG tablet TAKE 1 TABLET BY MOUTH AT BEDTIME 90 tablet 0   Multiple Vitamins-Minerals (MULTIVITAMIN WITH MINERALS) tablet Take 1 tablet by mouth daily.     nystatin (MYCOSTATIN/NYSTOP) powder Apply 1 application topically 3 (three) times daily. (Patient taking differently: Apply 1 application  topically 3 (three) times daily as  needed (skin irritation (yeast)).) 60 g 1   ondansetron (ZOFRAN) 4 MG tablet Take 1 tablet (4 mg total) by mouth every 6 (six) hours as needed for nausea. 20 tablet 0   pantoprazole (PROTONIX) 40 MG tablet Take 1 tablet (40 mg total) by mouth daily. 90 tablet 1   Polyvinyl Alcohol-Povidone PF (REFRESH) 1.4-0.6 % SOLN Place 1-2 drops into both eyes 3 (three) times daily as needed (dry/irritated eyes).     potassium chloride (KLOR-CON) 10 MEQ tablet TAKE 1 TABLET BY MOUTH ONCE DAILY . APPOINTMENT REQUIRED FOR FUTURE REFILLS 30 tablet 1   rivaroxaban (XARELTO) 20 MG TABS tablet TAKE 1 TABLET BY MOUTH ONCE DAILY WITH SUPPER 30 tablet 5   rizatriptan (MAXALT) 10 MG tablet As needed for headache; may repeat in 2 hours if needed; max 2 per day or 8 per month 8 tablet 6   senna-docusate (SENOKOT-S) 8.6-50 MG tablet Take 1 tablet by mouth at bedtime as needed for mild constipation. 30 tablet 0   sertraline (ZOLOFT) 100 MG tablet TAKE 1 & 1/2 (ONE & ONE-HALF) TABLETS BY MOUTH ONCE DAILY (Patient taking differently: Take 100 mg by mouth daily. One tablet) 135 tablet 0   topiramate (TOPAMAX) 50 MG tablet Take 1 tablet (50 mg total) by mouth 2 (two) times daily. 180 tablet 3   traZODone (DESYREL) 50 MG tablet TAKE 1 TABLET BY MOUTH AT BEDTIME 90 tablet 0   valsartan (DIOVAN) 320 MG tablet Take 1 tablet by mouth once daily 90 tablet 0   No facility-administered medications prior to visit.    Allergies  Allergen Reactions  Megace [Megestrol] Other (See Comments)    Pt is s/p PE and DVT on low dose Megace   Adhesive [Tape] Other (See Comments)    Skin Burn.   Biofreeze [Menthol (Topical Analgesic)] Other (See Comments)    Skin Burn.   Camphor Other (See Comments)    Burning sensation.   Folic Acid Hives   Lisinopril Cough   Menthol Other (See Comments)    Burning sensation.   Sulfa Antibiotics Rash    ROS See HPI    Objective:    Physical Exam Constitutional:      General: She is not in  acute distress.    Appearance: Normal appearance. She is well-developed.  HENT:     Head: Normocephalic and atraumatic.     Right Ear: Tympanic membrane and external ear normal. Tympanic membrane is not erythematous.     Left Ear: Tympanic membrane and external ear normal. Tympanic membrane is not erythematous.  Eyes:     General: No scleral icterus. Neck:     Thyroid: No thyromegaly.  Cardiovascular:     Rate and Rhythm: Normal rate and regular rhythm.     Heart sounds: Normal heart sounds. No murmur heard. Pulmonary:     Effort: Pulmonary effort is normal. No respiratory distress.     Breath sounds: Normal breath sounds. No wheezing.  Musculoskeletal:     Cervical back: Neck supple.  Lymphadenopathy:     Cervical: No cervical adenopathy.  Skin:    General: Skin is warm and dry.  Neurological:     Mental Status: She is alert and oriented to person, place, and time.  Psychiatric:        Mood and Affect: Mood normal.        Behavior: Behavior normal.        Thought Content: Thought content normal.        Judgment: Judgment normal.      BP 130/70 (BP Location: Right Arm, Patient Position: Sitting, Cuff Size: Large)   Pulse 70   Temp 98.5 F (36.9 C) (Oral)   Resp 16   Ht 5\' 2"  (1.575 m)   Wt (!) 301 lb (136.5 kg)   SpO2 99%   BMI 55.05 kg/m  Wt Readings from Last 3 Encounters:  12/13/22 (!) 301 lb (136.5 kg)  11/25/22 292 lb (132.5 kg)  11/10/22 292 lb (132.5 kg)       Assessment & Plan:   Problem List Items Addressed This Visit       Unprioritized   Sinus congestion     Recent onset, improved with Tylenol severe sinus. -Continue Tylenol severe sinus as needed.      Left ear pain - Primary     Sharp, intermittent pain in the ear for about a year, worsening recently. Suspect secondary to eustachian tube dysfunction. No changes in hearing, fever, or drainage. Possible increased pressure in the ear noted on examination. -Continue current zyrtec restart  flonase.  -Notify if pain starts spreading across the face.       I am having Vedia Pereyra maintain her fluticasone, azelastine, acetaminophen, ondansetron, senna-docusate, nystatin, docusate sodium, Refresh, Vitamin D3, multivitamin with minerals, topiramate, rizatriptan, levothyroxine, Xarelto, methocarbamol, pantoprazole, valsartan, sertraline, traZODone, potassium chloride, furosemide, hydrochlorothiazide, ALPRAZolam, levothyroxine, montelukast, hydrOXYzine, and amLODipine.  No orders of the defined types were placed in this encounter.

## 2022-12-13 NOTE — Assessment & Plan Note (Signed)
  Sharp, intermittent pain in the ear for about a year, worsening recently. Suspect secondary to eustachian tube dysfunction. No changes in hearing, fever, or drainage. Possible increased pressure in the ear noted on examination. -Continue current zyrtec restart flonase.  -Notify if pain starts spreading across the face.

## 2022-12-13 NOTE — Assessment & Plan Note (Signed)
  Recent onset, improved with Tylenol severe sinus. -Continue Tylenol severe sinus as needed.

## 2022-12-24 ENCOUNTER — Other Ambulatory Visit: Payer: Self-pay | Admitting: Cardiology

## 2022-12-24 ENCOUNTER — Other Ambulatory Visit: Payer: Self-pay | Admitting: Family

## 2023-01-09 ENCOUNTER — Encounter: Payer: Self-pay | Admitting: Family

## 2023-01-27 ENCOUNTER — Other Ambulatory Visit: Payer: Self-pay | Admitting: Family

## 2023-02-10 ENCOUNTER — Other Ambulatory Visit: Payer: Self-pay | Admitting: Medical Genetics

## 2023-03-02 ENCOUNTER — Other Ambulatory Visit (HOSPITAL_COMMUNITY)
Admission: RE | Admit: 2023-03-02 | Discharge: 2023-03-02 | Disposition: A | Discharge status: Home / Self Care | Payer: Self-pay | Source: Ambulatory Visit | Attending: Medical Genetics | Admitting: Medical Genetics

## 2023-03-14 LAB — GENECONNECT MOLECULAR SCREEN: Genetic Analysis Overall Interpretation: NEGATIVE

## 2023-03-16 ENCOUNTER — Other Ambulatory Visit (HOSPITAL_BASED_OUTPATIENT_CLINIC_OR_DEPARTMENT_OTHER): Payer: Self-pay | Admitting: Neurosurgery

## 2023-03-16 DIAGNOSIS — M5127 Other intervertebral disc displacement, lumbosacral region: Secondary | ICD-10-CM

## 2023-03-19 ENCOUNTER — Ambulatory Visit (HOSPITAL_BASED_OUTPATIENT_CLINIC_OR_DEPARTMENT_OTHER)
Admission: RE | Admit: 2023-03-19 | Discharge: 2023-03-19 | Disposition: A | Discharge status: Home / Self Care | Payer: No Typology Code available for payment source | Source: Ambulatory Visit | Attending: Neurosurgery | Admitting: Neurosurgery

## 2023-03-19 DIAGNOSIS — M5127 Other intervertebral disc displacement, lumbosacral region: Secondary | ICD-10-CM | POA: Insufficient documentation

## 2023-03-27 ENCOUNTER — Other Ambulatory Visit: Payer: Self-pay | Admitting: Family

## 2023-03-28 ENCOUNTER — Other Ambulatory Visit: Payer: Self-pay | Admitting: Family

## 2023-04-24 ENCOUNTER — Telehealth: Payer: Self-pay | Admitting: Family

## 2023-04-24 NOTE — Telephone Encounter (Signed)
 Please call pt to schedule a pre-op visit.

## 2023-04-25 ENCOUNTER — Encounter: Payer: Self-pay | Admitting: Family

## 2023-04-25 ENCOUNTER — Telehealth: Payer: Self-pay | Admitting: Cardiology

## 2023-04-25 NOTE — Telephone Encounter (Signed)
 Patient schedule for 05/09/2023

## 2023-04-25 NOTE — Telephone Encounter (Signed)
 Pt called in requesting an update on clearance request to Dr. Servando Salina. There is a request note scanned into Media tab from 04/14/23. All I was able to get from that document was pt is having Left-sided L5-S1 microdisketomy with [facility] with Dr. Donalee Citrin. He also said that Xarelto hold was requested from PCP so I'm guessing they only need medical clearance. Please advise.

## 2023-04-26 ENCOUNTER — Encounter: Payer: Self-pay | Admitting: Cardiology

## 2023-04-26 NOTE — Telephone Encounter (Signed)
 I will forward to pre op APP to review if pt has been cleared.

## 2023-04-26 NOTE — Telephone Encounter (Signed)
 Preoperative team, please reach out to requesting office and have them submit a formal preoperative cardiac clearance request.  I have reviewed the 04/14/2023 note which appears to be a [facility] office note.  Once we receive request for preoperative clearance we will be able to provide recommendations from a cardiac standpoint.  Thank you for your help.  Thomasene Ripple. Meghann Landing NP-C     04/26/2023, 9:55 AM [facility] [address] Office [phone] Fax [phone]

## 2023-04-27 ENCOUNTER — Telehealth: Payer: Self-pay | Admitting: *Deleted

## 2023-04-27 ENCOUNTER — Telehealth: Payer: Self-pay

## 2023-04-27 NOTE — Telephone Encounter (Signed)
 Pt has been scheduled tele preop appt 05/02/23. Med rec and consent are done.

## 2023-04-27 NOTE — Telephone Encounter (Signed)
 Pt has been scheduled tele preop appt 05/02/23. Med rec and consent are done.     Patient Consent for Virtual Visit        Sharon Rivers has provided verbal consent on 04/27/2023 for a virtual visit (video or telephone).   CONSENT FOR VIRTUAL VISIT FOR:  Sharon Rivers  By participating in this virtual visit I agree to the following:  I hereby voluntarily request, consent and authorize [facility] and its employed or contracted physicians, physician assistants, nurse practitioners or other licensed health care professionals (the Practitioner), to provide me with telemedicine health care services (the "Services") as deemed necessary by the treating Practitioner. I acknowledge and consent to receive the Services by the Practitioner via telemedicine. I understand that the telemedicine visit will involve communicating with the Practitioner through live audiovisual communication technology and the disclosure of certain medical information by electronic transmission. I acknowledge that I have been given the opportunity to request an in-person assessment or other available alternative prior to the telemedicine visit and am voluntarily participating in the telemedicine visit.  I understand that I have the right to withhold or withdraw my consent to the use of telemedicine in the course of my care at any time, without affecting my right to future care or treatment, and that the Practitioner or I may terminate the telemedicine visit at any time. I understand that I have the right to inspect all information obtained and/or recorded in the course of the telemedicine visit and may receive copies of available information for a reasonable fee.  I understand that some of the potential risks of receiving the Services via telemedicine include:  Delay or interruption in medical evaluation due to technological equipment failure or disruption; Information transmitted may not be sufficient (e.g. poor resolution  of images) to allow for appropriate medical decision making by the Practitioner; and/or  In rare instances, security protocols could fail, causing a breach of personal health information.  Furthermore, I acknowledge that it is my responsibility to provide information about my medical history, conditions and care that is complete and accurate to the best of my ability. I acknowledge that Practitioner's advice, recommendations, and/or decision may be based on factors not within their control, such as incomplete or inaccurate data provided by me or distortions of diagnostic images or specimens that may result from electronic transmissions. I understand that the practice of medicine is not an exact science and that Practitioner makes no warranties or guarantees regarding treatment outcomes. I acknowledge that a copy of this consent can be made available to me via my patient portal [facility] MyChart), or I can request a printed copy by calling the office of  [facility].    I understand that my insurance will be billed for this visit.   I have read or had this consent read to me. I understand the contents of this consent, which adequately explains the benefits and risks of the Services being provided via telemedicine.  I have been provided ample opportunity to ask questions regarding this consent and the Services and have had my questions answered to my satisfaction. I give my informed consent for the services to be provided through the use of telemedicine in my medical care

## 2023-04-27 NOTE — Telephone Encounter (Signed)
..     Pre-operative Risk Assessment    Patient Name: Sharon Rivers  DOB: February 22, 1970 MRN: [id number]   Date of last office visit: 11/10/22 Date of next office visit: none   Request for Surgical Clearance    Procedure:   l5-s1 LUMBAR MICRODISKECTOMY  Date of Surgery:  Clearance TBD                                Surgeon:  DR Ronney Lion Surgeon's Group or Practice Name:  [facility] Phone number:  [phone] Fax number:  [phone]   Type of Clearance Requested:   - Medical  - Pharmacy:  Hold Rivaroxaban (Xarelto)     Type of Anesthesia:  General    Additional requests/questions:    Jola Babinski   04/27/2023, 11:56 AM

## 2023-04-27 NOTE — Telephone Encounter (Signed)
   Name: Sharon Rivers  DOB: [birth year]  MRN: [id number]  Primary Cardiologist: Thomasene Ripple, DO Last OV: 11/10/22  Preoperative team, please contact this patient and set up a phone call appointment for further preoperative risk assessment. Please obtain consent and complete medication review. Thank you for your help.  I confirm that guidance regarding antiplatelet and oral anticoagulation therapy has been completed and, if necessary, noted below.  Xarelto has not been prescribed by cardiology office, recommendations regarding holding of Xarelto will need to come from prescribing provider.  I also confirmed the patient [place] in the state of [place]. As per [facility] Medical Board telemedicine laws, the patient must reside in the state in which the provider is licensed. Pura Spice, [place]   Rip Harbour, NP 04/27/2023, 12:27 PM [facility]

## 2023-05-02 ENCOUNTER — Ambulatory Visit: Attending: Nurse Practitioner | Admitting: Nurse Practitioner

## 2023-05-02 DIAGNOSIS — Z01810 Encounter for preprocedural cardiovascular examination: Secondary | ICD-10-CM | POA: Diagnosis not present

## 2023-05-02 NOTE — Progress Notes (Signed)
 Virtual Visit via Telephone Note   Because of Sharon Rivers co-morbid illnesses, she is at least at moderate risk for complications without adequate follow up.  This format is felt to be most appropriate for this patient at this time.  Due to technical limitations with video connection (technology), today's appointment will be conducted as an audio only telehealth visit, and Sharon Rivers verbally agreed to proceed in this manner.   All issues noted in this document were discussed and addressed.  No physical exam could be performed with this format.  Evaluation Performed:  Preoperative cardiovascular risk assessment _____________   Date:  05/02/2023   Patient ID:  Sharon Rivers, DOB 1970/10/28, MRN [id number] Patient Location:  Home Provider location:   Office  Primary Care Provider:  Sandford Craze, NP Primary Cardiologist:  Thomasene Ripple, DO  Chief Complaint / Patient Profile   53 y.o. y/o female with a h/o DVT and PE on chronic anticoagulation, morbid obestiy, hyperlipidemia, HTN, OSA on CPAP, no evidence of CAD on coronary CTA who is pending L5-S1 lumbar microdiskectomy with Dr. Wynetta Emery on date TBD and presents today for telephonic preoperative cardiovascular risk assessment.  History of Present Illness    Sharon Rivers is a 53 y.o. female who presents via audio/video conferencing for a telehealth visit today.  Pt was last seen in cardiology clinic on 11/25/22 by Carmela Hurt, [facility].  At that time Sharon Rivers was doing well.  The patient is now pending procedure as outlined above. Since her last visit, she denies chest pain, shortness of breath, lower extremity edema, fatigue, palpitations, melena, hematuria, hemoptysis, diaphoresis, weakness, presyncope, syncope, orthopnea, and PND. She is somewhat limited by back pain but is able to achieve > 4 METS activity without concerning cardiac symptoms.   Past Medical History    Past Medical History:  Diagnosis Date   Anxiety    Back  pain    Chest pain    a. 07/2015 Myoview: EF 71%, medium size, mild intensity, partially reversible septal defect with overlying breast attenuation -> likely artifact. No significant reversible ischemia.   Constipation    Depression    Edema    feet and legs   Frequent headaches    GERD (gastroesophageal reflux disease)    History of DVT (deep vein thrombosis) 2018   Hypertension    Hypothyroidism    Insulin resistance    Joint pain    Migraines    OSA (obstructive sleep apnea) 03/17/2016   Osteoarthritis    Panic anxiety syndrome    PONV (postoperative nausea and vomiting)    Pulmonary embolism (HCC) 2022   SOB (shortness of breath)    a. 04/2016 Echo: EF 60-65%, Gr1 DD.   Swallowing difficulty    Past Surgical History:  Procedure Laterality Date   CESAREAN SECTION  2001 & 2002   DILATION AND CURETTAGE OF UTERUS N/A 09/08/2020   Procedure: DILATATION AND CURETTAGE;  Surgeon: Willodean Rosenthal, MD;  Location: [facility] OR;  Service: Gynecology;  Laterality: N/A;   ELBOW SURGERY     HYSTEROSCOPY WITH NOVASURE N/A 09/08/2020   Procedure: HYSTEROSCOPY WITH NOVASURE;  Surgeon: Willodean Rosenthal, MD;  Location: [facility] OR;  Service: Gynecology;  Laterality: N/A;   MINOR CARPAL TUNNEL     pinched nerve in elbow   WISDOM TOOTH EXTRACTION      Allergies  Allergies  Allergen Reactions   Megace [Megestrol] Other (See Comments)    Pt is s/p PE and DVT on low dose Megace  Adhesive [Tape] Other (See Comments)    Skin Burn.   Biofreeze [Menthol (Topical Analgesic)] Other (See Comments)    Skin Burn.   Camphor Other (See Comments)    Burning sensation.   Folic Acid Hives   Lisinopril Cough   Menthol Other (See Comments)    Burning sensation.   Sulfa Antibiotics Rash    Home Medications    Prior to Admission medications   Medication Sig Start Date End Date Taking? Authorizing Provider  acetaminophen (TYLENOL) 325 MG tablet Take 2 tablets (650 mg total) by mouth every 6  (six) hours as needed for mild pain (or Fever >/= 101). 05/17/20   Sheikh, Omair Latif, DO  ALPRAZolam Prudy Feeler) 0.25 MG tablet TAKE 1 TABLET BY MOUTH TWICE DAILY AS NEEDED FOR ANXIETY AND FOR SLEEP 03/28/23   Sandford Craze, NP  amLODipine (NORVASC) 5 MG tablet Take 1 tablet by mouth once daily 03/28/23   Sandford Craze, NP  azelastine (ASTELIN) 0.1 % nasal spray Place 1 spray into both nostrils 2 (two) times daily. Use in each nostril as directed Patient not taking: Reported on 04/27/2023 04/14/20   Sandford Craze, NP  Cholecalciferol (VITAMIN D3) 50 MCG (2000 UT) capsule Take 1 capsule (2,000 Units total) by mouth daily. 04/30/21   Sandford Craze, NP  docusate sodium (COLACE) 100 MG capsule Take 100 mg by mouth 2 (two) times daily as needed for mild constipation.    [provider]  fluticasone (FLONASE) 50 MCG/ACT nasal spray Place 2 sprays into both nostrils daily. Patient not taking: Reported on 04/27/2023 10/21/19   Zola Button, Grayling Congress, DO  furosemide (LASIX) 20 MG tablet Take 1 tablet by mouth once daily 12/26/22   Tobb, Kardie, DO  hydrochlorothiazide (MICROZIDE) 12.5 MG capsule Take 1 capsule by mouth once daily 10/24/22   Tobb, Kardie, DO  hydrOXYzine (VISTARIL) 25 MG capsule TAKE 1 CAPSULE BY MOUTH AT BEDTIME AS NEEDED FOR SLEEP 03/28/23   Sandford Craze, NP  levothyroxine (SYNTHROID) 200 MCG tablet Take 1 tablet by mouth once daily 01/28/23   Sandford Craze, NP  levothyroxine (SYNTHROID) 25 MCG tablet Take 1 tablet by mouth once daily 01/28/23   Sandford Craze, NP  methocarbamol (ROBAXIN) 500 MG tablet TAKE 1 TABLET BY MOUTH EVERY 8 HOURS AS NEEDED FOR MUSCLE SPASM 07/20/22   Worthy Rancher B, FNP  montelukast (SINGULAIR) 10 MG tablet TAKE 1 TABLET BY MOUTH AT BEDTIME 01/28/23   Sandford Craze, NP  Multiple Vitamins-Minerals (MULTIVITAMIN WITH MINERALS) tablet Take 1 tablet by mouth daily. 04/30/21   Sandford Craze, NP  nystatin  (MYCOSTATIN/NYSTOP) powder Apply 1 application topically 3 (three) times daily. Patient not taking: Reported on 04/27/2023 08/14/20   Sandford Craze, NP  ondansetron (ZOFRAN) 4 MG tablet Take 1 tablet (4 mg total) by mouth every 6 (six) hours as needed for nausea. Patient not taking: Reported on 04/27/2023 05/17/20   Marguerita Merles Latif, DO  pantoprazole (PROTONIX) 40 MG tablet Take 1 tablet (40 mg total) by mouth daily. 07/29/22   Sandford Craze, NP  Polyvinyl Alcohol-Povidone PF (REFRESH) 1.4-0.6 % SOLN Place 1-2 drops into both eyes 3 (three) times daily as needed (dry/irritated eyes).    [provider]  potassium chloride (KLOR-CON) 10 MEQ tablet Take 1 tablet (10 mEq total) by mouth once for 1 dose. 12/26/22 12/26/22  Tobb, Kardie, DO  rivaroxaban (XARELTO) 20 MG TABS tablet TAKE 1 TABLET BY MOUTH ONCE DAILY WITH SUPPER 01/28/23   Sandford Craze, NP  rizatriptan (MAXALT) 10  MG tablet As needed for headache; may repeat in 2 hours if needed; max 2 per day or 8 per month 01/11/22   Lomax, Amy, NP  senna-docusate (SENOKOT-S) 8.6-50 MG tablet Take 1 tablet by mouth at bedtime as needed for mild constipation. 05/17/20   Sheikh, Omair Latif, DO  sertraline (ZOLOFT) 100 MG tablet TAKE 1 & 1/2 (ONE & ONE-HALF) TABLETS BY MOUTH ONCE DAILY Patient taking differently: Take 100 mg by mouth daily. One tablet 08/26/22   Sandford Craze, NP  topiramate (TOPAMAX) 50 MG tablet Take 1 tablet (50 mg total) by mouth 2 (two) times daily. 01/11/22   Lomax, Amy, NP  traZODone (DESYREL) 50 MG tablet TAKE 1 TABLET BY MOUTH AT BEDTIME 03/28/23   Sandford Craze, NP  valsartan (DIOVAN) 320 MG tablet Take 1 tablet (320 mg total) by mouth daily. 03/29/23   Sandford Craze, NP    Physical Exam    Vital Signs:  Saje Gallop does not have vital signs available for review today.  Given telephonic nature of communication, physical exam is limited. AAOx3. NAD. Normal affect.  Speech and respirations  are unlabored.  Accessory Clinical Findings    None  Assessment & Plan    1.  Preoperative Cardiovascular Risk Assessment: According to the Revised Cardiac Risk Index (RCRI), her Perioperative Risk of Major Cardiac Event is (%): 0.9. Her Functional Capacity in METs is: 5.72 according to the Duke Activity Status Index (DASI). The patient is doing well from a cardiac perspective. Therefore, based on ACC/AHA guidelines, the patient would be at acceptable risk for the planned procedure without further cardiovascular testing.   The patient was advised that if she develops new symptoms prior to surgery to contact our office to arrange for a follow-up visit, and she verbalized understanding.  Xarelto has not been prescribed by cardiology office, recommendations regarding holding of Xarelto will need to come from prescribing provider.   A copy of this note will be routed to requesting surgeon.  Time:   Today, I have spent 10 minutes with the patient with telehealth technology discussing medical history, symptoms, and management plan.     Sharon Aland, NP-C  05/02/2023, 11:01 AM [address] Office [phone] Fax [phone]

## 2023-05-03 ENCOUNTER — Other Ambulatory Visit: Payer: Self-pay | Admitting: Cardiology

## 2023-05-03 ENCOUNTER — Other Ambulatory Visit: Payer: Self-pay | Admitting: Family

## 2023-05-09 ENCOUNTER — Ambulatory Visit (HOSPITAL_BASED_OUTPATIENT_CLINIC_OR_DEPARTMENT_OTHER)
Admission: RE | Admit: 2023-05-09 | Discharge: 2023-05-09 | Disposition: A | Discharge status: Home / Self Care | Source: Ambulatory Visit | Attending: Family | Admitting: Family

## 2023-05-09 ENCOUNTER — Ambulatory Visit: Admitting: Family

## 2023-05-09 ENCOUNTER — Other Ambulatory Visit: Payer: Self-pay | Admitting: Neurosurgery

## 2023-05-09 ENCOUNTER — Encounter: Payer: Self-pay | Admitting: Family

## 2023-05-09 ENCOUNTER — Telehealth: Payer: Self-pay | Admitting: Family

## 2023-05-09 VITALS — BP 133/79 | HR 64 | Temp 98.8°F | Resp 14 | Ht 62.0 in | Wt 306.4 lb

## 2023-05-09 DIAGNOSIS — Z86711 Personal history of pulmonary embolism: Secondary | ICD-10-CM

## 2023-05-09 DIAGNOSIS — I10 Essential (primary) hypertension: Secondary | ICD-10-CM

## 2023-05-09 DIAGNOSIS — Z01818 Encounter for other preprocedural examination: Secondary | ICD-10-CM | POA: Insufficient documentation

## 2023-05-09 NOTE — Progress Notes (Addendum)
 Subjective:     Patient ID: Sharon Rivers, female    DOB: 1970/05/19, 53 y.o.   MRN: [id number]  Chief Complaint  Patient presents with   Pre-op Exam         HPI  Discussed the use of AI scribe software for clinical note transcription with the patient, who gave verbal consent to proceed.  History of Present Illness  Sharon Rivers is a 53 year old female who presents for preoperative evaluation for spinal surgery.  She is scheduled for L5-S1 microdiscectomy on May 22, 2023, to address a bulging disc. She experiences persistent back pain that is constant and affects her daily activities.  She uses a CPAP machine at home for sleep apnea.  She is currently taking Xarelto, a blood thinner for hx of DVT.   No current cough, cold symptoms, skin rashes, leg swelling, chest pain, shortness of breath, urinary issues, headaches, or concerns about depression or anxiety. Confirms back pain. No issues with hearing, vision, or bowel movements.     Health Maintenance Due  Topic Date Due   COVID-19 Vaccine (1) Never done   Zoster Vaccines- Shingrix (2 of 2) 10/18/2022    Past Medical History:  Diagnosis Date   Anxiety    Back pain    Chest pain    a. 07/2015 Myoview: EF 71%, medium size, mild intensity, partially reversible septal defect with overlying breast attenuation -> likely artifact. No significant reversible ischemia.   Constipation    Depression    Edema    feet and legs   Frequent headaches    GERD (gastroesophageal reflux disease)    History of DVT (deep vein thrombosis) 2018   Hypertension    Hypothyroidism    Insulin resistance    Joint pain    Migraines    OSA (obstructive sleep apnea) 03/17/2016   Osteoarthritis    Panic anxiety syndrome    PONV (postoperative nausea and vomiting)    Pulmonary embolism (HCC) 2022   SOB (shortness of breath)    a. 04/2016 Echo: EF 60-65%, Gr1 DD.   Swallowing difficulty     Past Surgical History:  Procedure Laterality  Date   CESAREAN SECTION  2001 & 2002   DILATION AND CURETTAGE OF UTERUS N/A 09/08/2020   Procedure: DILATATION AND CURETTAGE;  Surgeon: Willodean Rosenthal, MD;  Location: [facility] OR;  Service: Gynecology;  Laterality: N/A;   ELBOW SURGERY     HYSTEROSCOPY WITH NOVASURE N/A 09/08/2020   Procedure: HYSTEROSCOPY WITH NOVASURE;  Surgeon: Willodean Rosenthal, MD;  Location: [facility] OR;  Service: Gynecology;  Laterality: N/A;   MINOR CARPAL TUNNEL     pinched nerve in elbow   WISDOM TOOTH EXTRACTION      Family History  Problem Relation Age of Onset   Hypertension Mother        Living   Arthritis Mother    Thyroid disease Mother    Heart disease Mother        MI at age 39   Heart attack Mother    Migraines Mother    Depression Mother    Obesity Mother    Congestive Heart Failure Father        ?CHF   Hypertension Maternal Grandmother    Parkinson's disease Maternal Grandmother    Alzheimer's disease Maternal Grandmother    Cancer Maternal Grandfather    Hypertension Maternal Grandfather    Hypertension Paternal Grandmother    Hypertension Paternal Grandfather    Gout Brother  ADD / ADHD Son        x1   Other Son        #2-Unknown   Diabetes Neg Hx     Social History   Socioeconomic History   Marital status: Single    Spouse name: Not on file   Number of children: 2   Years of education: Not on file   Highest education level: Associate degree: academic program  Occupational History   Occupation: Assembler    Comment: [employer]  Tobacco Use   Smoking status: Former    Current packs/day: 0.00    Types: Cigarettes    Quit date: 1992    Years since quitting: 33.2   Smokeless tobacco: Never   Tobacco comments:    quit 1992  Vaping Use   Vaping status: Never Used  Substance and Sexual Activity   Alcohol use: No    Alcohol/week: 0.0 standard drinks of alcohol   Drug use: No   Sexual activity: Yes    Partners: Male    Birth control/protection: None  Other  Topics Concern   Not on file  Social History Narrative   She has 2 children (grown). She did not retain custody of these children.   Lives with boyfriend in [place].   Works in Audiological scientist at [employer]: Low Risk  (05/02/2023)   Overall Financial Resource Strain (CARDIA)    Difficulty of Paying Living Expenses: Not hard at all  Food Insecurity: No Food Insecurity (05/02/2023)   Hunger Vital Sign    Worried About Running Out of Food in the Last Year: Never true    Ran Out of Food in the Last Year: Never true  Transportation Needs: No Transportation Needs (05/02/2023)   PRAPARE - Administrator, Civil Service (Medical): No    Lack of Transportation (Non-Medical): No  Physical Activity: Unknown (05/02/2023)   Exercise Vital Sign    Days of Exercise per Week: 0 days    Minutes of Exercise per Session: Not on file  Stress: No Stress Concern Present (05/02/2023)   [employer] of Occupational Health - Occupational Stress Questionnaire    Feeling of Stress : Only a little  Social Connections: Moderately Integrated (05/02/2023)   Social Connection and Isolation Panel [NHANES]    Frequency of Communication with Friends and Family: More than three times a week    Frequency of Social Gatherings with Friends and Family: More than three times a week    Attends Religious Services: 1 to 4 times per year    Active Member of [employer] or Organizations: No    Attends Engineer, structural: Not on file    Marital Status: Living with partner  Intimate Partner Violence: Unknown (07/15/2022)   Received from [employer], [facility]   HITS    Physically Hurt: Not on file    Insult or Talk Down To: Not on file    Threaten Physical Harm: Not on file    Scream or Curse: Not on file    Outpatient Medications Prior to Visit  Medication Sig Dispense Refill   SEMAGLUTIDE-WEIGHT MANAGEMENT  20 mg once a week. [facility]  (compounding pharmacy)     acetaminophen (TYLENOL) 325 MG tablet Take 2 tablets (650 mg total) by mouth every 6 (six) hours as needed for mild pain (or Fever >/= 101). 20 tablet 0   ALPRAZolam (XANAX) 0.25 MG tablet TAKE  1 TABLET BY MOUTH TWICE DAILY AS NEEDED FOR ANXIETY AND FOR SLEEP 30 tablet 0   amLODipine (NORVASC) 5 MG tablet Take 1 tablet by mouth once daily 90 tablet 0   azelastine (ASTELIN) 0.1 % nasal spray Place 1 spray into both nostrils 2 (two) times daily. Use in each nostril as directed (Patient not taking: Reported on 04/27/2023) 30 mL 5   Cholecalciferol (VITAMIN D3) 50 MCG (2000 UT) capsule Take 1 capsule (2,000 Units total) by mouth daily.     docusate sodium (COLACE) 100 MG capsule Take 100 mg by mouth 2 (two) times daily as needed for mild constipation.     fluticasone (FLONASE) 50 MCG/ACT nasal spray Place 2 sprays into both nostrils daily. (Patient not taking: Reported on 04/27/2023) 16 g 6   furosemide (LASIX) 20 MG tablet Take 1 tablet by mouth once daily 90 tablet 3   hydrochlorothiazide (MICROZIDE) 12.5 MG capsule Take 1 capsule by mouth once daily 90 capsule 2   hydrOXYzine (VISTARIL) 25 MG capsule TAKE 1 CAPSULE BY MOUTH AT BEDTIME AS NEEDED FOR SLEEP 90 capsule 0   levothyroxine (SYNTHROID) 200 MCG tablet Take 1 tablet by mouth once daily 90 tablet 0   levothyroxine (SYNTHROID) 25 MCG tablet Take 1 tablet by mouth once daily 90 tablet 0   methocarbamol (ROBAXIN) 500 MG tablet TAKE 1 TABLET BY MOUTH EVERY 8 HOURS AS NEEDED FOR MUSCLE SPASM 20 tablet 0   montelukast (SINGULAIR) 10 MG tablet TAKE 1 TABLET BY MOUTH AT BEDTIME 90 tablet 0   Multiple Vitamins-Minerals (MULTIVITAMIN WITH MINERALS) tablet Take 1 tablet by mouth daily.     nystatin (MYCOSTATIN/NYSTOP) powder Apply 1 application topically 3 (three) times daily. (Patient not taking: Reported on 04/27/2023) 60 g 1   ondansetron (ZOFRAN) 4 MG tablet Take 1 tablet (4 mg total) by mouth every 6 (six) hours as needed for  nausea. (Patient not taking: Reported on 04/27/2023) 20 tablet 0   Polyvinyl Alcohol-Povidone PF (REFRESH) 1.4-0.6 % SOLN Place 1-2 drops into both eyes 3 (three) times daily as needed (dry/irritated eyes).     potassium chloride (KLOR-CON) 10 MEQ tablet Take 1 tablet (10 mEq total) by mouth once for 1 dose. 90 tablet 3   rivaroxaban (XARELTO) 20 MG TABS tablet TAKE 1 TABLET BY MOUTH ONCE DAILY WITH SUPPER 30 tablet 5   rizatriptan (MAXALT) 10 MG tablet As needed for headache; may repeat in 2 hours if needed; max 2 per day or 8 per month 8 tablet 6   senna-docusate (SENOKOT-S) 8.6-50 MG tablet Take 1 tablet by mouth at bedtime as needed for mild constipation. 30 tablet 0   sertraline (ZOLOFT) 100 MG tablet TAKE 1 & 1/2 (ONE & ONE-HALF) TABLETS BY MOUTH ONCE DAILY (Patient taking differently: Take 100 mg by mouth daily. One tablet) 135 tablet 0   topiramate (TOPAMAX) 50 MG tablet Take 1 tablet (50 mg total) by mouth 2 (two) times daily. 180 tablet 3   traZODone (DESYREL) 50 MG tablet TAKE 1 TABLET BY MOUTH AT BEDTIME 90 tablet 0   valsartan (DIOVAN) 320 MG tablet Take 1 tablet (320 mg total) by mouth daily. 90 tablet 0   pantoprazole (PROTONIX) 40 MG tablet Take 1 tablet (40 mg total) by mouth daily. 90 tablet 1   No facility-administered medications prior to visit.    Allergies  Allergen Reactions   Megace [Megestrol] Other (See Comments)    Pt is s/p PE and DVT on low dose Megace  Adhesive [Tape] Other (See Comments)    Skin Burn.   Biofreeze [Menthol (Topical Analgesic)] Other (See Comments)    Skin Burn.   Camphor Other (See Comments)    Burning sensation.   Folic Acid Hives   Lisinopril Cough   Menthol Other (See Comments)    Burning sensation.   Sulfa Antibiotics Rash    Review of Systems  HENT:  Negative for congestion and hearing loss.   Eyes:  Negative for blurred vision.  Respiratory:  Negative for cough and shortness of breath.   Cardiovascular:  Negative for chest pain  and leg swelling.  Gastrointestinal:  Negative for constipation and diarrhea.  Genitourinary:  Negative for dysuria and frequency.  Musculoskeletal:  Positive for back pain.  Skin:  Negative for rash.  Neurological:  Negative for headaches.  Psychiatric/Behavioral:         Denies depression/anxiety       Objective:    Physical Exam Constitutional:      General: She is not in acute distress.    Appearance: Normal appearance. She is well-developed.  HENT:     Head: Normocephalic and atraumatic.     Right Ear: Tympanic membrane, ear canal and external ear normal.     Left Ear: Tympanic membrane, ear canal and external ear normal.  Eyes:     General: No scleral icterus. Neck:     Thyroid: No thyromegaly.  Cardiovascular:     Rate and Rhythm: Normal rate and regular rhythm.     Heart sounds: Normal heart sounds. No murmur heard. Pulmonary:     Effort: Pulmonary effort is normal. No respiratory distress.     Breath sounds: Normal breath sounds. No wheezing.  Musculoskeletal:     Cervical back: Neck supple.  Skin:    General: Skin is warm and dry.  Neurological:     Mental Status: She is alert and oriented to person, place, and time.  Psychiatric:        Mood and Affect: Mood normal.        Behavior: Behavior normal.        Thought Content: Thought content normal.        Judgment: Judgment normal.      BP 133/79 (BP Location: Right Arm, Patient Position: Sitting, Cuff Size: Large)   Pulse 64   Temp 98.8 F (37.1 C) (Oral)   Resp 14   Ht 5\' 2"  (1.575 m)   Wt (!) 306 lb 6.4 oz (139 kg)   SpO2 100%   BMI 56.04 kg/m  Wt Readings from Last 3 Encounters:  05/09/23 (!) 306 lb 6.4 oz (139 kg)  12/13/22 (!) 301 lb (136.5 kg)  11/25/22 292 lb (132.5 kg)       Assessment & Plan:   Problem List Items Addressed This Visit       Unprioritized   Pre-op testing - Primary   EKG tracing is personally reviewed.  EKG notes NSR.  No acute changes. Await studies as below.          Relevant Orders   EKG 12-Lead (Completed)   Comp Met (CMET)   Urine Culture   CBC w/Diff   DG Chest 2 View   Protime-INR   PTT   History of pulmonary embolism   On lifelong xarelto. Will coordinate perioperative anticoagulation plan with hematology.        Addendum:  Reviewed anticoagulation plan with hematology. Recommendation is to hold xarelto 3 days before surgery and restart 3 days  after surgery. No lovenox bridge needed.  Pt is medically cleared with the above recommendation.  In addition, I recommend close post operative monitoring due to hx of OSA.   I have discontinued Aizley Sturgeon's pantoprazole. I am also having her maintain her fluticasone, azelastine, acetaminophen, ondansetron, senna-docusate, nystatin, docusate sodium, Refresh, Vitamin D3, multivitamin with minerals, topiramate, rizatriptan, methocarbamol, sertraline, furosemide, potassium chloride, levothyroxine, levothyroxine, montelukast, Xarelto, amLODipine, traZODone, hydrOXYzine, valsartan, ALPRAZolam, hydrochlorothiazide, and SEMAGLUTIDE-WEIGHT MANAGEMENT [place].  No orders of the defined types were placed in this encounter.

## 2023-05-09 NOTE — Telephone Encounter (Signed)
 Hi Sharon Rivers, I am working on her medical clearance for surgery. She is on lifelong xarelto for hx of DVT/PE.  Surgery has recommended that she hold xarelto for 5 days pre-op and 5 days post-op.  Would you recommend a lovenox bridge?

## 2023-05-09 NOTE — Progress Notes (Unsigned)
   Established Patient Office Visit  Subjective   Patient ID: Sharon Rivers, female    DOB: 10/07/1970  Age: 53 y.o. MRN: [id number]  Chief Complaint  Patient presents with   Pre-op Exam         53 yo patient being seen for pre-operative medical clearance. Patient states that she her surgery is scheduled for 05/22/2023. She denies any complaints at this time. States she is compliant with CPAP use. She reports that she is not having any GERD symptoms and no longer needs medication. She reports that she is followed by hematology for history of DVTs and PE; however, she states she has not seen hematology "in a while."   Review of Systems  Constitutional:  Negative for chills and fever.  HENT:  Negative for hearing loss and nosebleeds.   Eyes:  Negative for blurred vision and double vision.  Respiratory:  Negative for cough and shortness of breath.   Cardiovascular:  Negative for chest pain, palpitations and leg swelling.  Gastrointestinal:  Negative for abdominal pain, constipation, diarrhea, heartburn, nausea and vomiting.  Genitourinary:  Negative for dysuria and urgency.  Musculoskeletal:  Positive for back pain. Negative for joint pain and myalgias.  Skin:  Negative for rash.  Neurological:  Negative for dizziness, seizures, weakness and headaches.  Endo/Heme/Allergies:  Does not bruise/bleed easily.  Psychiatric/Behavioral:  Negative for depression.      Objective:     Physical Exam Vitals reviewed.  Constitutional:      Appearance: Normal appearance.  HENT:     Head: Normocephalic and atraumatic.     Right Ear: Tympanic membrane and external ear normal.     Left Ear: Tympanic membrane and external ear normal.     Nose: No congestion or rhinorrhea.     Mouth/Throat:     Mouth: Mucous membranes are moist.     Pharynx: Oropharynx is clear.  Eyes:     Extraocular Movements: Extraocular movements intact.     Conjunctiva/sclera: Conjunctivae normal.     Pupils: Pupils are  equal, round, and reactive to light.  Cardiovascular:     Rate and Rhythm: Normal rate and regular rhythm.     Pulses: Normal pulses.     Heart sounds: Normal heart sounds.  Pulmonary:     Effort: Pulmonary effort is normal.     Breath sounds: Normal breath sounds.  Abdominal:     Palpations: Abdomen is soft.  Skin:    General: Skin is warm and dry.     Capillary Refill: Capillary refill takes less than 2 seconds.  Neurological:     Mental Status: She is alert and oriented to person, place, and time.  Psychiatric:        Mood and Affect: Mood normal.        Behavior: Behavior normal.      Assessment & Plan:   -Pre-op testing - EKG, CBC, PT/INR, PTT, CMP, and urine culture done in office today.   -History of DVT/PE - currently taking Xarelto. Will consult with hematology regarding need for enoxaparin bridge therapy.   -OSA - patient reports compliance with nightly CPAP use.  -Hypertension - stable on amlodipine and valsartan.   Cristopher Peru, RN

## 2023-05-09 NOTE — Assessment & Plan Note (Signed)
 On lifelong xarelto. Will coordinate perioperative anticoagulation plan with hematology.

## 2023-05-09 NOTE — Assessment & Plan Note (Signed)
 BP stable.

## 2023-05-09 NOTE — Assessment & Plan Note (Addendum)
 EKG tracing is personally reviewed.  EKG notes NSR.  No acute changes. Await studies as below.

## 2023-05-10 ENCOUNTER — Encounter: Payer: Self-pay | Admitting: Family

## 2023-05-10 LAB — COMPREHENSIVE METABOLIC PANEL WITH GFR
ALT: 18 U/L (ref 0–35)
AST: 16 U/L (ref 0–37)
Albumin: 4.3 g/dL (ref 3.5–5.2)
Alkaline Phosphatase: 63 U/L (ref 39–117)
BUN: 20 mg/dL (ref 6–23)
CO2: 26 meq/L (ref 19–32)
Calcium: 9.5 mg/dL (ref 8.4–10.5)
Chloride: 101 meq/L (ref 96–112)
Creatinine, Ser: 1.22 mg/dL — ABNORMAL HIGH (ref 0.40–1.20)
GFR: 51.41 mL/min — ABNORMAL LOW (ref >60.00)
Glucose, Bld: 78 mg/dL (ref 70–99)
Potassium: 3.8 meq/L (ref 3.5–5.1)
Sodium: 135 meq/L (ref 135–145)
Total Bilirubin: 0.5 mg/dL (ref 0.2–1.2)
Total Protein: 8 g/dL (ref 6.0–8.3)

## 2023-05-10 LAB — URINE CULTURE
MICRO NUMBER:: 16273924
SPECIMEN QUALITY:: ADEQUATE

## 2023-05-10 LAB — CBC WITH DIFFERENTIAL/PLATELET
Basophils Absolute: 0.1 10*3/uL (ref 0.0–0.1)
Basophils Relative: 1.1 % (ref 0.0–3.0)
Eosinophils Absolute: 0.3 10*3/uL (ref 0.0–0.7)
Eosinophils Relative: 4.7 % (ref 0.0–5.0)
HCT: 38.5 % (ref 36.0–46.0)
Hemoglobin: 13.2 g/dL (ref 12.0–15.0)
Lymphocytes Relative: 22.7 % (ref 12.0–46.0)
Lymphs Abs: 1.5 10*3/uL (ref 0.7–4.0)
MCHC: 34.3 g/dL (ref 30.0–36.0)
MCV: 82.7 fl (ref 78.0–100.0)
Monocytes Absolute: 0.5 10*3/uL (ref 0.1–1.0)
Monocytes Relative: 7.8 % (ref 3.0–12.0)
Neutro Abs: 4.1 10*3/uL (ref 1.4–7.7)
Neutrophils Relative %: 63.7 % (ref 43.0–77.0)
Platelets: 322 10*3/uL (ref 150.0–400.0)
RBC: 4.65 Mil/uL (ref 3.87–5.11)
RDW: 14.7 % (ref 11.5–15.5)
WBC: 6.5 10*3/uL (ref 4.0–10.5)

## 2023-05-11 ENCOUNTER — Encounter: Payer: Self-pay | Admitting: Family

## 2023-05-12 NOTE — Progress Notes (Addendum)
 Surgical Instructions   Your procedure is scheduled on Monday, April 14, 25. Report to [facility] Main Entrance "A" at 5:30 A.M., then check in with the Admitting office. Any questions or running late day of surgery: call [phone]  Questions prior to your surgery date: call [phone], Monday-Friday, 8am-4pm. If you experience any cold or flu symptoms such as cough, fever, chills, shortness of breath, etc. between now and your scheduled surgery, please notify us at the above number.     Remember:  Do not eat or drink after midnight the night before your surgery   Take these medicines the morning of surgery with A SIP OF WATER  amLODipine (NORVASC)  levothyroxine (SYNTHROID)  sertraline (ZOLOFT) topiramate (TOPAMAX)    May take these medicines IF NEEDED: acetaminophen (TYLENOL)  ALPRAZolam (XANAX)  azelastine (ASTELIN) nasal spray  fluticasone (FLONASE)  nasal spray  methocarbamol (ROBAXIN)  ondansetron (ZOFRAN)  Polyvinyl Alcohol-Povidone PF (REFRESH)   Follow your surgeon's instructions on when to stop rivaroxaban (AZRELTO).  If no instructions were given by your surgeon then you will need to call the office to get those instructions.    Hold your [facility] for 7 days prior to your surgery. Last day to take your semaglutide should be on or before Sunday, April 6th.   One week prior to surgery, STOP taking any Aspirin (unless otherwise instructed by your surgeon) Aleve, Naproxen, Ibuprofen, Motrin, Advil, Goody's, BC's, all herbal medications, fish oil, and non-prescription vitamins.                     Do NOT Smoke (Tobacco/Vaping) for 24 hours prior to your procedure.  If you use a CPAP at night, you may bring your mask/headgear for your overnight stay.   You will be asked to remove any contacts, glasses, piercing's, hearing aid's, dentures/partials prior to surgery. Please bring cases for these items if needed.    Patients discharged the day of surgery will not  be allowed to drive home, and someone needs to stay with them for 24 hours.  SURGICAL WAITING ROOM VISITATION Patients may have no more than 2 support people in the waiting area - these visitors may rotate.   Pre-op nurse will coordinate an appropriate time for 1 ADULT support person, who may not rotate, to accompany patient in pre-op.  Children under the age of 36 must have an adult with them who is not the patient and must remain in the main waiting area with an adult.  If the patient needs to stay at the hospital during part of their recovery, the visitor guidelines for inpatient rooms apply.  Please refer to the [facility] website for the visitor guidelines for any additional information.   If you received a COVID test during your pre-op visit  it is requested that you wear a mask when out in public, stay away from anyone that may not be feeling well and notify your surgeon if you develop symptoms. If you have been in contact with anyone that has tested positive in the last 10 days please notify you surgeon.      Pre-operative 5 CHG Bathing Instructions   You can play a key role in reducing the risk of infection after surgery. Your skin needs to be as free of germs as possible. You can reduce the number of germs on your skin by washing with CHG (chlorhexidine gluconate) soap before surgery. CHG is an antiseptic soap that kills germs and continues to kill germs even  after washing.   DO NOT use if you have an allergy to chlorhexidine/CHG or antibacterial soaps. If your skin becomes reddened or irritated, stop using the CHG and notify one of our RNs at [phone].   Please shower with the CHG soap starting 4 days before surgery using the following schedule:     Please keep in mind the following:  DO NOT shave, including legs and underarms, starting the day of your first shower.   You may shave your face at any point before/day of surgery.  Place clean sheets on your bed the day you  start using CHG soap. Use a clean washcloth (not used since being washed) for each shower. DO NOT sleep with pets once you start using the CHG.   CHG Shower Instructions:  Wash your face and private area with normal soap. If you choose to wash your hair, wash first with your normal shampoo.  After you use shampoo/soap, rinse your hair and body thoroughly to remove shampoo/soap residue.  Turn the water OFF and apply about 3 tablespoons (45 ml) of CHG soap to a CLEAN washcloth.  Apply CHG soap ONLY FROM YOUR NECK DOWN TO YOUR TOES (washing for 3-5 minutes)  DO NOT use CHG soap on face, private areas, open wounds, or sores.  Pay special attention to the area where your surgery is being performed.  If you are having back surgery, having someone wash your back for you may be helpful. Wait 2 minutes after CHG soap is applied, then you may rinse off the CHG soap.  Pat dry with a clean towel  Put on clean clothes/pajamas   If you choose to wear lotion, please use ONLY the CHG-compatible lotions that are listed below.  Additional instructions for the day of surgery: DO NOT APPLY any lotions, deodorants, cologne, or perfumes.   Do not bring valuables to the hospital. [facility] is not responsible for any belongings/valuables. Do not wear nail polish, gel polish, artificial nails, or any other type of covering on natural nails (fingers and toes) Do not wear jewelry or makeup Put on clean/comfortable clothes.  Please brush your teeth.  Ask your nurse before applying any prescription medications to the skin.     CHG Compatible Lotions   Aveeno Moisturizing lotion  Cetaphil Moisturizing Cream  Cetaphil Moisturizing Lotion  Clairol Herbal Essence Moisturizing Lotion, Dry Skin  Clairol Herbal Essence Moisturizing Lotion, Extra Dry Skin  Clairol Herbal Essence Moisturizing Lotion, Normal Skin  Curel Age Defying Therapeutic Moisturizing Lotion with Alpha Hydroxy  Curel Extreme Care Body Lotion   Curel Soothing Hands Moisturizing Hand Lotion  Curel Therapeutic Moisturizing Cream, Fragrance-Free  Curel Therapeutic Moisturizing Lotion, Fragrance-Free  Curel Therapeutic Moisturizing Lotion, Original Formula  Eucerin Daily Replenishing Lotion  Eucerin Dry Skin Therapy Plus Alpha Hydroxy Crme  Eucerin Dry Skin Therapy Plus Alpha Hydroxy Lotion  Eucerin Original Crme  Eucerin Original Lotion  Eucerin Plus Crme Eucerin Plus Lotion  Eucerin TriLipid Replenishing Lotion  Keri Anti-Bacterial Hand Lotion  Keri Deep Conditioning Original Lotion Dry Skin Formula Softly Scented  Keri Deep Conditioning Original Lotion, Fragrance Free Sensitive Skin Formula  Keri Lotion Fast Absorbing Fragrance Free Sensitive Skin Formula  Keri Lotion Fast Absorbing Softly Scented Dry Skin Formula  Keri Original Lotion  Keri Skin Renewal Lotion Keri Silky Smooth Lotion  Keri Silky Smooth Sensitive Skin Lotion  Nivea Body Creamy Conditioning Oil  Nivea Body Extra Enriched Regulatory affairs officer  Nivea Crme  Nivea Skin Firming Lotion  NutraDerm 30 Skin Lotion  NutraDerm Skin Lotion  NutraDerm Therapeutic Skin Cream  NutraDerm Therapeutic Skin Lotion  ProShield Protective Hand Cream  Provon moisturizing lotion  Please read over the following fact sheets that you were given.

## 2023-05-15 ENCOUNTER — Encounter (HOSPITAL_COMMUNITY)
Admission: RE | Admit: 2023-05-15 | Discharge: 2023-05-15 | Disposition: A | Discharge status: Home / Self Care | Source: Ambulatory Visit | Attending: Neurosurgery | Admitting: Neurosurgery

## 2023-05-15 ENCOUNTER — Encounter (HOSPITAL_COMMUNITY): Payer: Self-pay

## 2023-05-15 VITALS — BP 124/80 | HR 56 | Temp 98.4°F | Ht 62.0 in | Wt 299.0 lb

## 2023-05-15 DIAGNOSIS — E039 Hypothyroidism, unspecified: Secondary | ICD-10-CM | POA: Insufficient documentation

## 2023-05-15 DIAGNOSIS — Z86718 Personal history of other venous thrombosis and embolism: Secondary | ICD-10-CM | POA: Diagnosis not present

## 2023-05-15 DIAGNOSIS — I10 Essential (primary) hypertension: Secondary | ICD-10-CM | POA: Insufficient documentation

## 2023-05-15 DIAGNOSIS — M5126 Other intervertebral disc displacement, lumbar region: Secondary | ICD-10-CM | POA: Diagnosis not present

## 2023-05-15 DIAGNOSIS — Z01812 Encounter for preprocedural laboratory examination: Secondary | ICD-10-CM | POA: Insufficient documentation

## 2023-05-15 DIAGNOSIS — Z87891 Personal history of nicotine dependence: Secondary | ICD-10-CM | POA: Diagnosis not present

## 2023-05-15 DIAGNOSIS — Z7901 Long term (current) use of anticoagulants: Secondary | ICD-10-CM | POA: Insufficient documentation

## 2023-05-15 DIAGNOSIS — R791 Abnormal coagulation profile: Secondary | ICD-10-CM

## 2023-05-15 DIAGNOSIS — E88819 Insulin resistance, unspecified: Secondary | ICD-10-CM | POA: Insufficient documentation

## 2023-05-15 DIAGNOSIS — F419 Anxiety disorder, unspecified: Secondary | ICD-10-CM | POA: Insufficient documentation

## 2023-05-15 DIAGNOSIS — Z01818 Encounter for other preprocedural examination: Secondary | ICD-10-CM

## 2023-05-15 LAB — SURGICAL PCR SCREEN
MRSA, PCR: NEGATIVE
Staphylococcus aureus: POSITIVE — AB

## 2023-05-15 NOTE — Progress Notes (Signed)
 PCP - Dr. Sandford Craze Cardiologist - [place] - clearance 05-02-23   PPM/ICD - Denies Device Orders - n/a Rep Notified - n/a  Chest x-ray - N/a EKG - 05-09-23 Stress Test - 08-05-25 ECHO - 05-13-20 Cardiac Cath - Denies  Sleep Study - Yes, OSA CPAP - Wears nightly  NON-diabetic  Last dose of GLP1 agonist-  Semaglutide - takes weakly on Sunday for weight loss GLP1 instructions: Per patient her last dose was April 6, Sunday. Instructed not to take for 7 day's prior to surgery.   Blood Thinner Instructions: Xarelto - pt stated hematology and cardiology told her do not take 3 day's prior to surgery. Aspirin Instructions:Denies  ERAS Protcol - NPO PRE-SURGERY Ensure or G2- none  COVID TEST- no   Anesthesia review: Yes, cardiac clearance, HTN, OSA, Hx DVT/PE, swallowing difficulty.  Patient denies shortness of breath, fever, cough and chest pain at PAT appointment. Patient denies any respiratory issues at this time.    All instructions explained to the patient, with a verbal understanding of the material. Patient agrees to go over the instructions while at home for a better understanding. Patient also instructed to self quarantine after being tested for COVID-19. The opportunity to ask questions was provided.

## 2023-05-16 ENCOUNTER — Telehealth: Payer: Self-pay | Admitting: Family

## 2023-05-16 ENCOUNTER — Other Ambulatory Visit (INDEPENDENT_AMBULATORY_CARE_PROVIDER_SITE_OTHER)

## 2023-05-16 DIAGNOSIS — Z01818 Encounter for other preprocedural examination: Secondary | ICD-10-CM | POA: Diagnosis not present

## 2023-05-16 NOTE — Progress Notes (Signed)
 Lab redraw. No charge today.

## 2023-05-16 NOTE — Progress Notes (Signed)
 Anesthesia Chart Review:  Case: [id number] Date/Time: 05/22/23 0715   Procedure: LUMBAR LAMINECTOMY/DECOMPRESSION MICRODISCECTOMY 1 LEVEL (Left: Back) - Microdiscectomy - L5-S1 - left   Anesthesia type: General   Diagnosis: Herniated lumbar disc without myelopathy [M51.26]   Pre-op diagnosis: Herniated lumbar disc without myelopathy   [facility]   Surgeons: Donalee Citrin, MD       DISCUSSION: Patient is a 53 year old female scheduled for the above procedure.  History includes former smoker (quit 02/07/90), postoperative N/V, HTN, GERD, hypothyroidism, dyspnea, migraines, dysphagia, insulin resistance, OSA, DVT (RLE DVT 4/309, 05/2020), PE (05/12/20), panic anxiety syndrome. No evidence of CAD by 2023 CCTA.  Is on lifelong anticoagulation for recurrent DVT, PE.  She is currently on Xarelto.  Medical clearance as outlined by PCP Sandford Craze, NP, " Reviewed anticoagulation plan with hematology. Recommendation is to hold xarelto 3 days before surgery and restart 3 days after surgery. No lovenox bridge needed.   Pt is medically cleared with the above recommendation.  In addition, I recommend close post operative monitoring due to hx of OSA. "  She had preopreative cardiology telphonic evaluation on 05/02/23 by Eligha Bridegroom, NP, "Preoperative Cardiovascular Risk Assessment: According to the Revised Cardiac Risk Index (RCRI), her Perioperative Risk of Major Cardiac Event is (%): 0.9. Her Functional Capacity in METs is: 5.72 according to the Duke Activity Status Index (DASI). The patient is doing well from a cardiac perspective. Therefore, based on ACC/AHA guidelines, the patient would be at acceptable risk for the planned procedure without further cardiovascular testing."  Last semaglutide 05/14/23.    Anesthesia team to evaluate on the day of surgery. She had labs through primary care on 05/09/23, except PT/PTT collected on 05/16/23--results are pending.   VS: BP 124/80   Pulse  (!) 56   Temp 36.9 C   Ht 5\' 2"  (1.575 m)   Wt 135.6 kg   LMP  (LMP Unknown) Comment: pt is unaware of last cycle  SpO2 100%   BMI 54.69 kg/m   PROVIDERS: Sandford Craze, NP is PCP Thomasene Ripple, DO is cardiologist   LABS: Lab from 05/09/23 reviewed. Results included: Lab Results  Component Value Date   WBC 6.5 05/09/2023   HGB 13.2 05/09/2023   HCT 38.5 05/09/2023   PLT 322.0 05/09/2023   GLUCOSE 78 05/09/2023   CHOL 164 08/23/2022   TRIG 172.0 (H) 08/23/2022   HDL 36.30 (L) 08/23/2022   LDLCALC 93 08/23/2022   ALT 18 05/09/2023   AST 16 05/09/2023   NA 135 05/09/2023   K 3.8 05/09/2023   CL 101 05/09/2023   CREATININE 1.22 (H) 05/09/2023   BUN 20 05/09/2023   CO2 26 05/09/2023   TSH 5.21 08/23/2022   HGBA1C 5.4 11/25/2022     IMAGES: CXR 05/09/23: FINDINGS: Heart is stable in size.The cardiomediastinal contours are normal. The lungs are clear. Pulmonary vasculature is normal. No consolidation, pleural effusion, or pneumothorax. No acute osseous abnormalities are seen. IMPRESSION: No active cardiopulmonary disease.   MRI L-spine 03/19/23: IMPRESSION: 1. Unchanged small left subarticular disc protrusion at L5-S1 narrowing the left lateral recess and potentially affecting the left S1 nerve root. 2. Unchanged mild left L1-L2 neural foraminal stenosis. 3. No spinal canal stenosis.    EKG: 05/09/2023: Sinus Rhythm  Low voltage in precordial leads. ABNORMAL   CV: CT Coronary 06/08/21: IMPRESSION: 1. Coronary calcium score of 0. This was 0 percentile for age and sex matched control. 2. Normal coronary origin  with right dominance. 3. CAD-RADS 0. No evidence of CAD (0%). Consider non-atherosclerotic causes of chest pain.    Echo 05/13/20: IMPRESSIONS   1. Left ventricular ejection fraction, by estimation, is 60 to 65%. The  left ventricle has normal function. The left ventricle has no regional  wall motion abnormalities. There is mild left ventricular  hypertrophy.  Left ventricular diastolic parameters  are consistent with Grade I diastolic dysfunction (impaired relaxation).   2. Right ventricular systolic function is normal. The right ventricular  size is normal.   3. The mitral valve is grossly normal. No evidence of mitral valve  regurgitation.   4. The aortic valve was not well visualized. Aortic valve regurgitation  is not visualized.  - Conclusion(s)/Recommendation(s): No evidence of right heart strain.    Nuclear stress test 08/06/15: Nuclear stress EF: 71%. No T wave inversion was noted during stress. There was no ST segment deviation noted during stress. Defect 1: There is a medium defect of mild severity. This is a low risk study.   Medium size, mild intensity partially reversible septal defect with overlying breast attenuation which is likely artifact. No significant reversible ischemia. LVEF 71% with normal wall motion. This is a low risk study.  Past Medical History:  Diagnosis Date   Anxiety    Back pain    Chest pain    a. 07/2015 Myoview: EF 71%, medium size, mild intensity, partially reversible septal defect with overlying breast attenuation -> likely artifact. No significant reversible ischemia.   Constipation    Depression    Edema    feet and legs   Frequent headaches    GERD (gastroesophageal reflux disease)    History of DVT (deep vein thrombosis) 2018   Hypertension    Hypothyroidism    Insulin resistance    Joint pain    Migraines    OSA (obstructive sleep apnea) 03/17/2016   Osteoarthritis    Panic anxiety syndrome    PONV (postoperative nausea and vomiting)    Pulmonary embolism (HCC) 2022   SOB (shortness of breath)    a. 04/2016 Echo: EF 60-65%, Gr1 DD.   Swallowing difficulty     Past Surgical History:  Procedure Laterality Date   CESAREAN SECTION  2001 & 2002   DILATION AND CURETTAGE OF UTERUS N/A 09/08/2020   Procedure: DILATATION AND CURETTAGE;  Surgeon: Willodean Rosenthal, MD;   Location: [facility];  Service: Gynecology;  Laterality: N/A;   ELBOW SURGERY     HYSTEROSCOPY WITH NOVASURE N/A 09/08/2020   Procedure: HYSTEROSCOPY WITH NOVASURE;  Surgeon: Willodean Rosenthal, MD;  Location: [facility];  Service: Gynecology;  Laterality: N/A;   MINOR CARPAL TUNNEL     pinched nerve in elbow   WISDOM TOOTH EXTRACTION      MEDICATIONS:  acetaminophen (TYLENOL) 325 MG tablet   ALPRAZolam (XANAX) 0.25 MG tablet   amLODipine (NORVASC) 5 MG tablet   azelastine (ASTELIN) 0.1 % nasal spray   Cholecalciferol (VITAMIN D3) 50 MCG (2000 UT) capsule   docusate sodium (COLACE) 100 MG capsule   fluticasone (FLONASE) 50 MCG/ACT nasal spray   furosemide (LASIX) 20 MG tablet   hydrochlorothiazide (MICROZIDE) 12.5 MG capsule   hydrOXYzine (VISTARIL) 25 MG capsule   levothyroxine (SYNTHROID) 200 MCG tablet   levothyroxine (SYNTHROID) 25 MCG tablet   methocarbamol (ROBAXIN) 500 MG tablet   montelukast (SINGULAIR) 10 MG tablet   Multiple Vitamins-Minerals (MULTIVITAMIN WITH MINERALS) tablet   nystatin (MYCOSTATIN/NYSTOP) powder   ondansetron (ZOFRAN) 4 MG tablet  Polyvinyl Alcohol-Povidone PF (REFRESH) 1.4-0.6 % SOLN   potassium chloride (KLOR-CON) 10 MEQ tablet   rivaroxaban (XARELTO) 20 MG TABS tablet   rizatriptan (MAXALT) 10 MG tablet   SEMAGLUTIDE-WEIGHT MANAGEMENT [place]   sertraline (ZOLOFT) 100 MG tablet   topiramate (TOPAMAX) 50 MG tablet   traZODone (DESYREL) 50 MG tablet   valsartan (DIOVAN) 320 MG tablet   No current facility-administered medications for this encounter.    Shonna Chock, PA-C Surgical Short Stay/Anesthesiology [facility] Phone [phone] [facility] Phone [phone] 05/16/2023 7:33 PM

## 2023-05-16 NOTE — Telephone Encounter (Signed)
 Please advise pt that it looks like we need her to return for PT/INR to complete her pre-op labs and then she should be good to go for clearance.

## 2023-05-16 NOTE — Telephone Encounter (Signed)
 Called patient but no answer, left voice mail for patient to call back. She needs lab appointment, ok to schedule.

## 2023-05-16 NOTE — Telephone Encounter (Signed)
 Please send pre-op clearance to [facility]- Dr. Wynetta Emery.

## 2023-05-16 NOTE — Telephone Encounter (Signed)
 Per other note, needs additional labs

## 2023-05-16 NOTE — Anesthesia Preprocedure Evaluation (Addendum)
 Anesthesia Evaluation  Patient identified by MRN, date of birth, ID band Patient awake    Reviewed: Allergy & Precautions, H&P , NPO status , Patient's Chart, lab work & pertinent test results  History of Anesthesia Complications (+) PONV and history of anesthetic complications  Airway Mallampati: II  TM Distance: >3 FB Neck ROM: Full    Dental no notable dental hx.    Pulmonary sleep apnea , former smoker   Pulmonary exam normal breath sounds clear to auscultation       Cardiovascular hypertension, Normal cardiovascular exam Rhythm:Regular Rate:Normal     Neuro/Psych  Headaches PSYCHIATRIC DISORDERS Anxiety Depression    Herniated lumbar disc without myelopathy    GI/Hepatic Neg liver ROS,GERD  ,,  Endo/Other  Hypothyroidism    Renal/GU negative Renal ROS  negative genitourinary   Musculoskeletal  (+) Arthritis ,    Abdominal   Peds negative pediatric ROS (+)  Hematology negative hematology ROS (+)   Anesthesia Other Findings   Reproductive/Obstetrics negative OB ROS                             Anesthesia Physical Anesthesia Plan  ASA: 3  Anesthesia Plan: General   Post-op Pain Management: Tylenol PO (pre-op)*   Induction: Intravenous  PONV Risk Score and Plan: 4 or greater and Ondansetron, Dexamethasone, Treatment may vary due to age or medical condition and Midazolam  Airway Management Planned: Oral ETT  Additional Equipment:   Intra-op Plan:   Post-operative Plan: Extubation in OR  Informed Consent: I have reviewed the patients History and Physical, chart, labs and discussed the procedure including the risks, benefits and alternatives for the proposed anesthesia with the patient or authorized representative who has indicated his/her understanding and acceptance.     Dental advisory given  Plan Discussed with: CRNA  Anesthesia Plan Comments: (PAT note written  05/16/2023 by Allison Zelenak, PA-C.  )       Anesthesia Quick Evaluation

## 2023-05-16 NOTE — Telephone Encounter (Signed)
 Patient called back and is scheduled for labs today at 3:30

## 2023-05-17 ENCOUNTER — Telehealth: Payer: Self-pay | Admitting: Neurology

## 2023-05-17 LAB — PROTIME-INR
INR: 1.6 ratio — ABNORMAL HIGH (ref 0.8–1.0)
Prothrombin Time: 16.5 s — ABNORMAL HIGH (ref 9.6–13.1)

## 2023-05-17 LAB — APTT: aPTT: 35 s (ref 25.4–36.8)

## 2023-05-17 NOTE — Telephone Encounter (Signed)
 Copied from CRM [id number]. Topic: General - Other >> May 16, 2023  5:43 PM [place] J wrote: Reason for CRM: Patient wanted to let Windell Moulding know that labs were completed today.

## 2023-05-22 ENCOUNTER — Ambulatory Visit (HOSPITAL_COMMUNITY)
Admission: RE | Disposition: A | Discharge status: Home / Self Care | Payer: Self-pay | Source: Ambulatory Visit | Attending: Neurosurgery

## 2023-05-22 ENCOUNTER — Ambulatory Visit (HOSPITAL_COMMUNITY)
Admission: RE | Admit: 2023-05-22 | Discharge: 2023-05-22 | Disposition: A | Discharge status: Home / Self Care | Source: Ambulatory Visit | Attending: Neurosurgery | Admitting: Neurosurgery

## 2023-05-22 ENCOUNTER — Other Ambulatory Visit: Payer: Self-pay

## 2023-05-22 ENCOUNTER — Ambulatory Visit (HOSPITAL_BASED_OUTPATIENT_CLINIC_OR_DEPARTMENT_OTHER): Payer: Self-pay

## 2023-05-22 ENCOUNTER — Encounter (HOSPITAL_COMMUNITY): Payer: Self-pay | Admitting: Neurosurgery

## 2023-05-22 ENCOUNTER — Ambulatory Visit (HOSPITAL_COMMUNITY): Payer: Self-pay | Admitting: Vascular Surgery

## 2023-05-22 ENCOUNTER — Ambulatory Visit (HOSPITAL_COMMUNITY)

## 2023-05-22 DIAGNOSIS — Z79899 Other long term (current) drug therapy: Secondary | ICD-10-CM | POA: Insufficient documentation

## 2023-05-22 DIAGNOSIS — Z7901 Long term (current) use of anticoagulants: Secondary | ICD-10-CM | POA: Insufficient documentation

## 2023-05-22 DIAGNOSIS — R519 Headache, unspecified: Secondary | ICD-10-CM | POA: Insufficient documentation

## 2023-05-22 DIAGNOSIS — E039 Hypothyroidism, unspecified: Secondary | ICD-10-CM | POA: Diagnosis not present

## 2023-05-22 DIAGNOSIS — Z01818 Encounter for other preprocedural examination: Secondary | ICD-10-CM

## 2023-05-22 DIAGNOSIS — E119 Type 2 diabetes mellitus without complications: Secondary | ICD-10-CM | POA: Insufficient documentation

## 2023-05-22 DIAGNOSIS — Z79890 Hormone replacement therapy (postmenopausal): Secondary | ICD-10-CM | POA: Diagnosis not present

## 2023-05-22 DIAGNOSIS — F419 Anxiety disorder, unspecified: Secondary | ICD-10-CM | POA: Diagnosis not present

## 2023-05-22 DIAGNOSIS — Z86711 Personal history of pulmonary embolism: Secondary | ICD-10-CM | POA: Diagnosis not present

## 2023-05-22 DIAGNOSIS — Z87891 Personal history of nicotine dependence: Secondary | ICD-10-CM | POA: Insufficient documentation

## 2023-05-22 DIAGNOSIS — M5126 Other intervertebral disc displacement, lumbar region: Secondary | ICD-10-CM | POA: Diagnosis present

## 2023-05-22 DIAGNOSIS — G4730 Sleep apnea, unspecified: Secondary | ICD-10-CM | POA: Diagnosis not present

## 2023-05-22 DIAGNOSIS — I10 Essential (primary) hypertension: Secondary | ICD-10-CM

## 2023-05-22 DIAGNOSIS — Z6841 Body Mass Index (BMI) 40.0 and over, adult: Secondary | ICD-10-CM | POA: Insufficient documentation

## 2023-05-22 DIAGNOSIS — F32A Depression, unspecified: Secondary | ICD-10-CM | POA: Diagnosis not present

## 2023-05-22 DIAGNOSIS — R791 Abnormal coagulation profile: Secondary | ICD-10-CM

## 2023-05-22 DIAGNOSIS — G4733 Obstructive sleep apnea (adult) (pediatric): Secondary | ICD-10-CM

## 2023-05-22 DIAGNOSIS — M5127 Other intervertebral disc displacement, lumbosacral region: Secondary | ICD-10-CM | POA: Diagnosis present

## 2023-05-22 DIAGNOSIS — Z86718 Personal history of other venous thrombosis and embolism: Secondary | ICD-10-CM | POA: Diagnosis not present

## 2023-05-22 DIAGNOSIS — M1990 Unspecified osteoarthritis, unspecified site: Secondary | ICD-10-CM | POA: Insufficient documentation

## 2023-05-22 DIAGNOSIS — K219 Gastro-esophageal reflux disease without esophagitis: Secondary | ICD-10-CM | POA: Diagnosis not present

## 2023-05-22 DIAGNOSIS — M5117 Intervertebral disc disorders with radiculopathy, lumbosacral region: Secondary | ICD-10-CM | POA: Insufficient documentation

## 2023-05-22 DIAGNOSIS — E669 Obesity, unspecified: Secondary | ICD-10-CM | POA: Diagnosis not present

## 2023-05-22 HISTORY — PX: LUMBAR LAMINECTOMY/DECOMPRESSION MICRODISCECTOMY: SHX5026

## 2023-05-22 LAB — PROTIME-INR
INR: 1.1 (ref 0.8–1.2)
Prothrombin Time: 14.1 s (ref 11.4–15.2)

## 2023-05-22 SURGERY — LUMBAR LAMINECTOMY/DECOMPRESSION MICRODISCECTOMY 1 LEVEL
Anesthesia: General | Site: Back | Laterality: Left

## 2023-05-22 MED ORDER — LIDOCAINE 2% (20 MG/ML) 5 ML SYRINGE
INTRAMUSCULAR | Status: AC
  Filled 2023-05-22: qty 5

## 2023-05-22 MED ORDER — AZELASTINE HCL 0.1 % NA SOLN: 1.0000 | Freq: Two times a day (BID) | NASAL | Status: DC | PRN

## 2023-05-22 MED ORDER — DEXAMETHASONE SODIUM PHOSPHATE 10 MG/ML IJ SOLN
INTRAMUSCULAR | Status: DC | PRN
  Administered 2023-05-22: 10 mg via INTRAVENOUS

## 2023-05-22 MED ORDER — ROCURONIUM BROMIDE 10 MG/ML (PF) SYRINGE
PREFILLED_SYRINGE | INTRAVENOUS | Status: DC | PRN
  Administered 2023-05-22: 70 mg via INTRAVENOUS

## 2023-05-22 MED ORDER — SODIUM CHLORIDE 0.9% FLUSH
3.0000 mL | Freq: Two times a day (BID) | INTRAVENOUS | Status: DC
  Administered 2023-05-22: 3 mL via INTRAVENOUS

## 2023-05-22 MED ORDER — LACTATED RINGERS IV SOLN: INTRAVENOUS | Status: DC | PRN

## 2023-05-22 MED ORDER — ACETAMINOPHEN 10 MG/ML IV SOLN: 1000.0000 mg | Freq: Once | INTRAVENOUS | Status: DC | PRN

## 2023-05-22 MED ORDER — FENTANYL CITRATE (PF) 100 MCG/2ML IJ SOLN
INTRAMUSCULAR | Status: AC
  Filled 2023-05-22: qty 2

## 2023-05-22 MED ORDER — FENTANYL CITRATE (PF) 100 MCG/2ML IJ SOLN
25.0000 ug | INTRAMUSCULAR | Status: DC | PRN
  Administered 2023-05-22: 50 ug via INTRAVENOUS

## 2023-05-22 MED ORDER — HYDROXYZINE HCL 25 MG PO TABS: 25.0000 mg | ORAL_TABLET | Freq: Every day | ORAL | Status: DC

## 2023-05-22 MED ORDER — LIDOCAINE-EPINEPHRINE 1 %-1:100000 IJ SOLN
INTRAMUSCULAR | Status: AC
  Filled 2023-05-22: qty 1

## 2023-05-22 MED ORDER — CEFAZOLIN SODIUM-DEXTROSE 2-4 GM/100ML-% IV SOLN
2.0000 g | Freq: Three times a day (TID) | INTRAVENOUS | Status: DC
  Administered 2023-05-22: 2 g via INTRAVENOUS
  Filled 2023-05-22: qty 100

## 2023-05-22 MED ORDER — LEVOTHYROXINE SODIUM 100 MCG PO TABS: 200.0000 ug | ORAL_TABLET | Freq: Every day | ORAL | Status: DC

## 2023-05-22 MED ORDER — ACETAMINOPHEN 325 MG PO TABS: 650.0000 mg | ORAL_TABLET | ORAL | Status: DC | PRN

## 2023-05-22 MED ORDER — SERTRALINE HCL 100 MG PO TABS
100.0000 mg | ORAL_TABLET | Freq: Every day | ORAL | Status: DC
  Administered 2023-05-22: 100 mg via ORAL
  Filled 2023-05-22: qty 1

## 2023-05-22 MED ORDER — PHENYLEPHRINE 80 MCG/ML (10ML) SYRINGE FOR IV PUSH (FOR BLOOD PRESSURE SUPPORT)
PREFILLED_SYRINGE | INTRAVENOUS | Status: AC
  Filled 2023-05-22: qty 10

## 2023-05-22 MED ORDER — 0.9 % SODIUM CHLORIDE (POUR BTL) OPTIME
TOPICAL | Status: DC | PRN
  Administered 2023-05-22: 1000 mL

## 2023-05-22 MED ORDER — DEXAMETHASONE SODIUM PHOSPHATE 10 MG/ML IJ SOLN
INTRAMUSCULAR | Status: AC
  Filled 2023-05-22: qty 1

## 2023-05-22 MED ORDER — SUMATRIPTAN SUCCINATE 25 MG PO TABS: 25.0000 mg | ORAL_TABLET | ORAL | Status: DC | PRN

## 2023-05-22 MED ORDER — METHOCARBAMOL 500 MG PO TABS: 500.0000 mg | ORAL_TABLET | Freq: Three times a day (TID) | ORAL | 1 refills | Status: AC | PRN

## 2023-05-22 MED ORDER — CHLORHEXIDINE GLUCONATE 4 % EX SOLN: 1.0000 | CUTANEOUS | 1 refills | Status: AC

## 2023-05-22 MED ORDER — HYDROCODONE-ACETAMINOPHEN 5-325 MG PO TABS: 2.0000 | ORAL_TABLET | ORAL | Status: DC | PRN

## 2023-05-22 MED ORDER — ADULT MULTIVITAMIN W/MINERALS CH: 1.0000 | ORAL_TABLET | Freq: Every day | ORAL | Status: DC

## 2023-05-22 MED ORDER — ONDANSETRON HCL 4 MG/2ML IJ SOLN
INTRAMUSCULAR | Status: AC
  Filled 2023-05-22: qty 2

## 2023-05-22 MED ORDER — OXYCODONE HCL 5 MG PO TABS
ORAL_TABLET | ORAL | Status: AC
  Filled 2023-05-22: qty 1

## 2023-05-22 MED ORDER — PANTOPRAZOLE SODIUM 40 MG IV SOLR: 40.0000 mg | Freq: Every day | INTRAVENOUS | Status: DC

## 2023-05-22 MED ORDER — SCOPOLAMINE 1 MG/3DAYS TD PT72
1.0000 | MEDICATED_PATCH | TRANSDERMAL | Status: DC
  Administered 2023-05-22: 1.5 mg via TRANSDERMAL
  Filled 2023-05-22: qty 1

## 2023-05-22 MED ORDER — SODIUM CHLORIDE 0.9 % IV SOLN
250.0000 mL | INTRAVENOUS | Status: DC
  Administered 2023-05-22: 250 mL via INTRAVENOUS

## 2023-05-22 MED ORDER — SODIUM CHLORIDE 0.9% FLUSH: 3.0000 mL | INTRAVENOUS | Status: DC | PRN

## 2023-05-22 MED ORDER — THROMBIN 5000 UNITS EX KIT
PACK | CUTANEOUS | Status: AC
  Filled 2023-05-22: qty 2

## 2023-05-22 MED ORDER — CHLORHEXIDINE GLUCONATE CLOTH 2 % EX PADS: 6.0000 | MEDICATED_PAD | Freq: Once | CUTANEOUS | Status: DC

## 2023-05-22 MED ORDER — POLYVINYL ALCOHOL 1.4 % OP SOLN: 1.0000 [drp] | Freq: Three times a day (TID) | OPHTHALMIC | Status: DC | PRN

## 2023-05-22 MED ORDER — FUROSEMIDE 20 MG PO TABS
20.0000 mg | ORAL_TABLET | Freq: Every day | ORAL | Status: DC
  Administered 2023-05-22: 20 mg via ORAL
  Filled 2023-05-22: qty 1

## 2023-05-22 MED ORDER — PHENYLEPHRINE HCL-NACL 20-0.9 MG/250ML-% IV SOLN
INTRAVENOUS | Status: DC | PRN
  Administered 2023-05-22: 80 ug/min via INTRAVENOUS

## 2023-05-22 MED ORDER — MUPIROCIN 2 % EX OINT
1.0000 | TOPICAL_OINTMENT | Freq: Two times a day (BID) | CUTANEOUS | 0 refills | Status: AC
Start: 2023-05-22 — End: 2023-06-21

## 2023-05-22 MED ORDER — SUGAMMADEX SODIUM 200 MG/2ML IV SOLN
INTRAVENOUS | Status: DC | PRN
  Administered 2023-05-22 (×2): 200 mg via INTRAVENOUS

## 2023-05-22 MED ORDER — MENTHOL 3 MG MT LOZG: 1.0000 | LOZENGE | OROMUCOSAL | Status: DC | PRN

## 2023-05-22 MED ORDER — TRAZODONE HCL 50 MG PO TABS: 50.0000 mg | ORAL_TABLET | Freq: Every day | ORAL | Status: DC

## 2023-05-22 MED ORDER — CHLORHEXIDINE GLUCONATE 0.12 % MT SOLN
15.0000 mL | OROMUCOSAL | Status: AC
  Administered 2023-05-22: 15 mL via OROMUCOSAL
  Filled 2023-05-22: qty 15

## 2023-05-22 MED ORDER — HYDROCHLOROTHIAZIDE 12.5 MG PO TABS
12.5000 mg | ORAL_TABLET | Freq: Every day | ORAL | Status: DC
  Administered 2023-05-22: 12.5 mg via ORAL
  Filled 2023-05-22: qty 1

## 2023-05-22 MED ORDER — METHOCARBAMOL 500 MG PO TABS: 500.0000 mg | ORAL_TABLET | Freq: Three times a day (TID) | ORAL | Status: DC | PRN

## 2023-05-22 MED ORDER — OXYCODONE HCL 5 MG PO TABS
5.0000 mg | ORAL_TABLET | Freq: Once | ORAL | Status: AC | PRN
  Administered 2023-05-22: 5 mg via ORAL

## 2023-05-22 MED ORDER — ALUM & MAG HYDROXIDE-SIMETH 200-200-20 MG/5ML PO SUSP: 30.0000 mL | Freq: Four times a day (QID) | ORAL | Status: DC | PRN

## 2023-05-22 MED ORDER — LIDOCAINE-EPINEPHRINE 1 %-1:100000 IJ SOLN
INTRAMUSCULAR | Status: DC | PRN
  Administered 2023-05-22: 10 mL

## 2023-05-22 MED ORDER — VITAMIN D3 25 MCG (1000 UNIT) PO TABS
2000.0000 [IU] | ORAL_TABLET | Freq: Every day | ORAL | Status: DC
  Filled 2023-05-22: qty 2

## 2023-05-22 MED ORDER — AMLODIPINE BESYLATE 5 MG PO TABS
5.0000 mg | ORAL_TABLET | Freq: Every day | ORAL | Status: DC
  Administered 2023-05-22: 5 mg via ORAL
  Filled 2023-05-22: qty 1

## 2023-05-22 MED ORDER — OXYCODONE HCL 5 MG/5ML PO SOLN: 5.0000 mg | Freq: Once | ORAL | Status: AC | PRN

## 2023-05-22 MED ORDER — FLUTICASONE PROPIONATE 50 MCG/ACT NA SUSP: 2.0000 | Freq: Every day | NASAL | Status: DC | PRN

## 2023-05-22 MED ORDER — ALPRAZOLAM 0.25 MG PO TABS: 0.2500 mg | ORAL_TABLET | Freq: Every day | ORAL | Status: DC

## 2023-05-22 MED ORDER — BUPIVACAINE HCL (PF) 0.25 % IJ SOLN
INTRAMUSCULAR | Status: AC
  Filled 2023-05-22: qty 30

## 2023-05-22 MED ORDER — LEVOTHYROXINE SODIUM 25 MCG PO TABS
25.0000 ug | ORAL_TABLET | Freq: Every day | ORAL | Status: DC
Start: 2023-05-22 — End: 2023-05-22

## 2023-05-22 MED ORDER — CEFAZOLIN SODIUM-DEXTROSE 3-4 GM/150ML-% IV SOLN
3.0000 g | INTRAVENOUS | Status: AC
  Administered 2023-05-22: 3 g via INTRAVENOUS
  Filled 2023-05-22: qty 150

## 2023-05-22 MED ORDER — LIDOCAINE 2% (20 MG/ML) 5 ML SYRINGE
INTRAMUSCULAR | Status: DC | PRN
  Administered 2023-05-22: 100 mg via INTRAVENOUS

## 2023-05-22 MED ORDER — HYDROXYZINE PAMOATE 25 MG PO CAPS
25.0000 mg | ORAL_CAPSULE | Freq: Every day | ORAL | Status: DC
  Filled 2023-05-22: qty 1

## 2023-05-22 MED ORDER — IRBESARTAN 75 MG PO TABS
37.5000 mg | ORAL_TABLET | Freq: Every day | ORAL | Status: DC
  Administered 2023-05-22: 37.5 mg via ORAL
  Filled 2023-05-22: qty 1

## 2023-05-22 MED ORDER — MONTELUKAST SODIUM 10 MG PO TABS: 10.0000 mg | ORAL_TABLET | Freq: Every day | ORAL | Status: DC

## 2023-05-22 MED ORDER — POTASSIUM CHLORIDE ER 10 MEQ PO TBCR
10.0000 meq | EXTENDED_RELEASE_TABLET | Freq: Every day | ORAL | Status: DC
  Administered 2023-05-22: 10 meq via ORAL
  Filled 2023-05-22 (×2): qty 1

## 2023-05-22 MED ORDER — MIDAZOLAM HCL 2 MG/2ML IJ SOLN
INTRAMUSCULAR | Status: DC | PRN
  Administered 2023-05-22: 2 mg via INTRAVENOUS

## 2023-05-22 MED ORDER — ACETAMINOPHEN 325 MG PO TABS: 650.0000 mg | ORAL_TABLET | Freq: Four times a day (QID) | ORAL | Status: DC | PRN

## 2023-05-22 MED ORDER — HYDROMORPHONE HCL 1 MG/ML IJ SOLN: 0.5000 mg | INTRAMUSCULAR | Status: DC | PRN

## 2023-05-22 MED ORDER — THROMBIN (RECOMBINANT) 5000 UNITS EX SOLR
CUTANEOUS | Status: DC | PRN
  Administered 2023-05-22: 10 mL via TOPICAL

## 2023-05-22 MED ORDER — DOCUSATE SODIUM 100 MG PO CAPS: 100.0000 mg | ORAL_CAPSULE | Freq: Two times a day (BID) | ORAL | Status: DC | PRN

## 2023-05-22 MED ORDER — GLYCOPYRROLATE 0.2 MG/ML IJ SOLN
INTRAMUSCULAR | Status: DC | PRN
  Administered 2023-05-22: .1 mg via INTRAVENOUS

## 2023-05-22 MED ORDER — ACETAMINOPHEN 500 MG PO TABS
1000.0000 mg | ORAL_TABLET | Freq: Once | ORAL | Status: AC
  Administered 2023-05-22: 1000 mg via ORAL
  Filled 2023-05-22: qty 2

## 2023-05-22 MED ORDER — PHENOL 1.4 % MT LIQD: 1.0000 | OROMUCOSAL | Status: DC | PRN

## 2023-05-22 MED ORDER — HYDROCHLOROTHIAZIDE 12.5 MG PO CAPS
12.5000 mg | ORAL_CAPSULE | Freq: Every day | ORAL | Status: DC
  Filled 2023-05-22: qty 1

## 2023-05-22 MED ORDER — DROPERIDOL 2.5 MG/ML IJ SOLN: 0.6250 mg | Freq: Once | INTRAMUSCULAR | Status: DC | PRN

## 2023-05-22 MED ORDER — FENTANYL CITRATE (PF) 250 MCG/5ML IJ SOLN
INTRAMUSCULAR | Status: DC | PRN
Start: 2023-05-22 — End: 2023-05-22
  Administered 2023-05-22 (×3): 50 ug via INTRAVENOUS

## 2023-05-22 MED ORDER — GLYCOPYRROLATE PF 0.2 MG/ML IJ SOSY
PREFILLED_SYRINGE | INTRAMUSCULAR | Status: AC
  Filled 2023-05-22: qty 1

## 2023-05-22 MED ORDER — ONDANSETRON HCL 4 MG/2ML IJ SOLN
INTRAMUSCULAR | Status: DC | PRN
  Administered 2023-05-22: 4 mg via INTRAVENOUS

## 2023-05-22 MED ORDER — ACETAMINOPHEN 650 MG RE SUPP: 650.0000 mg | RECTAL | Status: DC | PRN

## 2023-05-22 MED ORDER — MIDAZOLAM HCL 2 MG/2ML IJ SOLN
INTRAMUSCULAR | Status: AC
  Filled 2023-05-22: qty 2

## 2023-05-22 MED ORDER — PROPOFOL 10 MG/ML IV BOLUS
INTRAVENOUS | Status: DC | PRN
  Administered 2023-05-22: 30 ug/kg/min via INTRAVENOUS
  Administered 2023-05-22: 150 mg via INTRAVENOUS

## 2023-05-22 MED ORDER — PROPOFOL 10 MG/ML IV BOLUS
INTRAVENOUS | Status: AC
  Filled 2023-05-22: qty 20

## 2023-05-22 MED ORDER — ONDANSETRON HCL 4 MG/2ML IJ SOLN: 4.0000 mg | Freq: Four times a day (QID) | INTRAMUSCULAR | Status: DC | PRN

## 2023-05-22 MED ORDER — LEVOTHYROXINE SODIUM 75 MCG PO TABS
225.0000 ug | ORAL_TABLET | Freq: Every day | ORAL | Status: DC
  Administered 2023-05-22: 225 ug via ORAL
  Filled 2023-05-22: qty 3

## 2023-05-22 MED ORDER — FENTANYL CITRATE (PF) 250 MCG/5ML IJ SOLN
INTRAMUSCULAR | Status: AC
  Filled 2023-05-22: qty 5

## 2023-05-22 MED ORDER — NYSTATIN 100000 UNIT/GM EX POWD: 1.0000 | Freq: Three times a day (TID) | CUTANEOUS | Status: DC | PRN

## 2023-05-22 MED ORDER — TOPIRAMATE 25 MG PO TABS
50.0000 mg | ORAL_TABLET | Freq: Two times a day (BID) | ORAL | Status: DC
  Administered 2023-05-22: 50 mg via ORAL
  Filled 2023-05-22: qty 2

## 2023-05-22 MED ORDER — BUPIVACAINE HCL (PF) 0.25 % IJ SOLN
INTRAMUSCULAR | Status: DC | PRN
  Administered 2023-05-22: 10 mL

## 2023-05-22 MED ORDER — ROCURONIUM BROMIDE 10 MG/ML (PF) SYRINGE
PREFILLED_SYRINGE | INTRAVENOUS | Status: AC
  Filled 2023-05-22: qty 10

## 2023-05-22 MED ORDER — HYDROCODONE-ACETAMINOPHEN 5-325 MG PO TABS: 2.0000 | ORAL_TABLET | ORAL | 0 refills | Status: DC | PRN

## 2023-05-22 SURGICAL SUPPLY — 44 items
BAG COUNTER SPONGE SURGICOUNT (BAG) ×1 IMPLANT
BAND RUBBER #18 3X1/16 STRL (MISCELLANEOUS) ×2 IMPLANT
BENZOIN TINCTURE PRP APPL 2/3 (GAUZE/BANDAGES/DRESSINGS) ×1 IMPLANT
BLADE CLIPPER SURG (BLADE) IMPLANT
BLADE SURG 11 STRL SS (BLADE) ×1 IMPLANT
BUR CUTTER 7.0 ROUND (BURR) ×1 IMPLANT
BUR MATCHSTICK NEURO 3.0 LAGG (BURR) ×1 IMPLANT
CANISTER SUCT 3000ML PPV (MISCELLANEOUS) ×1 IMPLANT
DERMABOND ADVANCED .7 DNX12 (GAUZE/BANDAGES/DRESSINGS) ×1 IMPLANT
DRAPE HALF SHEET 40X57 (DRAPES) IMPLANT
DRAPE LAPAROTOMY 100X72X124 (DRAPES) ×1 IMPLANT
DRAPE MICROSCOPE SLANT 54X150 (MISCELLANEOUS) ×1 IMPLANT
DRAPE SURG 17X23 STRL (DRAPES) ×1 IMPLANT
DRSG OPSITE POSTOP 4X6 (GAUZE/BANDAGES/DRESSINGS) IMPLANT
DURAPREP 26ML APPLICATOR (WOUND CARE) ×1 IMPLANT
ELECT REM PT RETURN 9FT ADLT (ELECTROSURGICAL) ×1 IMPLANT
ELECTRODE REM PT RTRN 9FT ADLT (ELECTROSURGICAL) ×1 IMPLANT
GAUZE 4X4 16PLY ~~LOC~~+RFID DBL (SPONGE) IMPLANT
GAUZE SPONGE 4X4 12PLY STRL (GAUZE/BANDAGES/DRESSINGS) ×1 IMPLANT
GLOVE BIO SURGEON STRL SZ7 (GLOVE) IMPLANT
GLOVE BIO SURGEON STRL SZ8 (GLOVE) ×1 IMPLANT
GLOVE BIOGEL PI IND STRL 7.0 (GLOVE) IMPLANT
GLOVE EXAM NITRILE XL STR (GLOVE) IMPLANT
GLOVE INDICATOR 8.5 STRL (GLOVE) ×2 IMPLANT
GOWN STRL REUS W/ TWL LRG LVL3 (GOWN DISPOSABLE) ×1 IMPLANT
GOWN STRL REUS W/ TWL XL LVL3 (GOWN DISPOSABLE) ×2 IMPLANT
GOWN STRL REUS W/TWL 2XL LVL3 (GOWN DISPOSABLE) IMPLANT
KIT BASIN OR (CUSTOM PROCEDURE TRAY) ×1 IMPLANT
KIT TURNOVER KIT B (KITS) ×1 IMPLANT
NDL HYPO 22X1.5 SAFETY MO (MISCELLANEOUS) ×1 IMPLANT
NDL SPNL 22GX3.5 QUINCKE BK (NEEDLE) ×1 IMPLANT
NEEDLE HYPO 22X1.5 SAFETY MO (MISCELLANEOUS) ×1 IMPLANT
NEEDLE SPNL 22GX3.5 QUINCKE BK (NEEDLE) ×1 IMPLANT
NS IRRIG 1000ML POUR BTL (IV SOLUTION) ×1 IMPLANT
PACK LAMINECTOMY NEURO (CUSTOM PROCEDURE TRAY) ×1 IMPLANT
SPIKE FLUID TRANSFER (MISCELLANEOUS) ×1 IMPLANT
SPONGE SURGIFOAM ABS GEL SZ50 (HEMOSTASIS) ×1 IMPLANT
STRIP CLOSURE SKIN 1/2X4 (GAUZE/BANDAGES/DRESSINGS) ×1 IMPLANT
SUT VIC AB 0 CT1 18XCR BRD8 (SUTURE) ×1 IMPLANT
SUT VIC AB 2-0 CT1 18 (SUTURE) ×1 IMPLANT
SUT VICRYL 4-0 PS2 18IN ABS (SUTURE) ×1 IMPLANT
TOWEL GREEN STERILE (TOWEL DISPOSABLE) ×1 IMPLANT
TOWEL GREEN STERILE FF (TOWEL DISPOSABLE) ×1 IMPLANT
WATER STERILE IRR 1000ML POUR (IV SOLUTION) ×1 IMPLANT

## 2023-05-22 NOTE — Anesthesia Procedure Notes (Signed)
 Procedure Name: Intubation Date/Time: 05/22/2023 7:49 AM  Performed by: Linard Reno, CRNAPre-anesthesia Checklist: Patient identified, Emergency Drugs available, Suction available, Patient being monitored and Timeout performed Patient Re-evaluated:Patient Re-evaluated prior to induction Oxygen Delivery Method: Circle system utilized Preoxygenation: Pre-oxygenation with 100% oxygen Induction Type: IV induction Ventilation: Mask ventilation without difficulty Laryngoscope Size: Glidescope and 3 Grade View: Grade I Tube type: Subglottic suction tube Tube size: 7.5 mm Number of attempts: 1 Airway Equipment and Method: Stylet and Video-laryngoscopy Placement Confirmation: ETT inserted through vocal cords under direct vision, breath sounds checked- equal and bilateral and CO2 detector Secured at: 21 cm Tube secured with: Tape Dental Injury: Teeth and Oropharynx as per pre-operative assessment  Comments: Limited mouth opening,Redundant pharyngeal tissues noted. Glottic opening clear; Atraumatic intubation.

## 2023-05-22 NOTE — Discharge Instructions (Signed)
Wound Care Keep incision covered and dry until post op day 3. You may remove the Honeycomb dressing on post op day 3. Leave steri-strips on back.  They will fall off by themselves. Do not put any creams, lotions, or ointments on incision. You are fine to shower. Let water run over incision and pat dry.  Activity Activity Walk each and every day, increasing distance each day. No lifting greater than 8 lbs.  No lifting no bending no twisting no driving or riding a car unless coming back and forth to see the doctor.   Diet Resume your normal diet.   Return to Work Will be discussed at your follow up appointment.  Call Your Doctor If Any of These Occur Redness, drainage, or swelling at the wound.  Temperature greater than 101 degrees. Severe pain not relieved by pain medication. Incision starts to come apart.  Follow Up Appt Call [phone] if you have one or any problem.

## 2023-05-22 NOTE — Transfer of Care (Signed)
 Immediate Anesthesia Transfer of Care Note  Patient: Sharon Rivers  Procedure(s) Performed: Microdiscectomy - Lumbar Five-Sacral One - left (Left: Back)  Patient Location: PACU  Anesthesia Type:General  Level of Consciousness: awake, alert , and oriented  Airway & Oxygen Therapy: Patient Spontanous Breathing  Post-op Assessment: Report given to RN and Post -op Vital signs reviewed and stable  Post vital signs: Reviewed and stable  Last Vitals:  Vitals Value Taken Time  BP 140/86 05/22/23 0930  Temp 36.4 C 05/22/23 0930  Pulse 65 05/22/23 0945  Resp 14 05/22/23 0945  SpO2 100 % 05/22/23 0945  Vitals shown include unfiled device data.  Last Pain:  Vitals:   05/22/23 0930  TempSrc:   PainSc: 5       Patients Stated Pain Goal: 6 (05/22/23 0606)  Complications: No notable events documented.

## 2023-05-22 NOTE — Progress Notes (Signed)
 Patient alert and oriented, mae's well, voiding adequate amount of urine, swallowing without difficulty, no c/o pain at time of discharge. Patient discharged home with family. Script and discharged instructions given to patient. Patient and family stated understanding of instructions given. Patient has an appointment with Dr. Wynetta Emery

## 2023-05-22 NOTE — Plan of Care (Signed)
 Problem: Education: Goal: Knowledge of General Education information will improve Description: Including pain rating scale, medication(s)/side effects and non-pharmacologic comfort measures 05/22/2023 1636 by Gregor Learned, RN Outcome: Completed/Met 05/22/2023 1145 by Gregor Learned, RN Outcome: Progressing   Problem: Health Behavior/Discharge Planning: Goal: Ability to manage health-related needs will improve 05/22/2023 1636 by Gregor Learned, RN Outcome: Completed/Met 05/22/2023 1145 by Gregor Learned, RN Outcome: Progressing   Problem: Clinical Measurements: Goal: Ability to maintain clinical measurements within normal limits will improve 05/22/2023 1636 by Gregor Learned, RN Outcome: Completed/Met 05/22/2023 1145 by Gregor Learned, RN Outcome: Progressing Goal: Will remain free from infection 05/22/2023 1636 by Gregor Learned, RN Outcome: Completed/Met 05/22/2023 1145 by Gregor Learned, RN Outcome: Progressing Goal: Diagnostic test results will improve 05/22/2023 1636 by Gregor Learned, RN Outcome: Completed/Met 05/22/2023 1145 by Gregor Learned, RN Outcome: Progressing Goal: Respiratory complications will improve 05/22/2023 1636 by Gregor Learned, RN Outcome: Completed/Met 05/22/2023 1145 by Gregor Learned, RN Outcome: Progressing Goal: Cardiovascular complication will be avoided 05/22/2023 1636 by Gregor Learned, RN Outcome: Completed/Met 05/22/2023 1145 by Gregor Learned, RN Outcome: Progressing   Problem: Activity: Goal: Risk for activity intolerance will decrease 05/22/2023 1636 by Gregor Learned, RN Outcome: Completed/Met 05/22/2023 1145 by Gregor Learned, RN Outcome: Progressing   Problem: Nutrition: Goal: Adequate nutrition will be maintained 05/22/2023 1636 by Gregor Learned, RN Outcome: Completed/Met 05/22/2023 1145 by Gregor Learned, RN Outcome: Progressing   Problem: Coping: Goal: Level of anxiety will decrease 05/22/2023  1636 by Gregor Learned, RN Outcome: Completed/Met 05/22/2023 1145 by Gregor Learned, RN Outcome: Progressing   Problem: Elimination: Goal: Will not experience complications related to bowel motility 05/22/2023 1636 by Gregor Learned, RN Outcome: Completed/Met 05/22/2023 1145 by Gregor Learned, RN Outcome: Progressing Goal: Will not experience complications related to urinary retention 05/22/2023 1636 by Gregor Learned, RN Outcome: Completed/Met 05/22/2023 1145 by Gregor Learned, RN Outcome: Progressing   Problem: Pain Managment: Goal: General experience of comfort will improve and/or be controlled 05/22/2023 1636 by Gregor Learned, RN Outcome: Completed/Met 05/22/2023 1145 by Gregor Learned, RN Outcome: Progressing   Problem: Safety: Goal: Ability to remain free from injury will improve 05/22/2023 1636 by Gregor Learned, RN Outcome: Completed/Met 05/22/2023 1145 by Gregor Learned, RN Outcome: Progressing   Problem: Skin Integrity: Goal: Risk for impaired skin integrity will decrease 05/22/2023 1636 by Gregor Learned, RN Outcome: Completed/Met 05/22/2023 1145 by Gregor Learned, RN Outcome: Progressing   Problem: Education: Goal: Ability to verbalize activity precautions or restrictions will improve 05/22/2023 1636 by Gregor Learned, RN Outcome: Completed/Met 05/22/2023 1145 by Gregor Learned, RN Outcome: Progressing Goal: Knowledge of the prescribed therapeutic regimen will improve 05/22/2023 1636 by Gregor Learned, RN Outcome: Completed/Met 05/22/2023 1145 by Gregor Learned, RN Outcome: Progressing Goal: Understanding of discharge needs will improve 05/22/2023 1636 by Gregor Learned, RN Outcome: Completed/Met 05/22/2023 1145 by Gregor Learned, RN Outcome: Progressing   Problem: Activity: Goal: Ability to avoid complications of mobility impairment will improve 05/22/2023 1636 by Gregor Learned, RN Outcome: Completed/Met 05/22/2023 1145 by  Gregor Learned, RN Outcome: Progressing Goal: Ability to tolerate increased activity will improve 05/22/2023 1636 by Gregor Learned, RN Outcome: Completed/Met 05/22/2023 1145 by Gregor Learned, RN Outcome: Progressing Goal: Will remain free from falls 05/22/2023 1636 by Gregor Learned, RN Outcome: Completed/Met 05/22/2023 1145 by Gregor Learned, RN Outcome: Progressing   Problem: Bowel/Gastric: Goal: Gastrointestinal status for postoperative course will improve 05/22/2023 1636 by Gregor Learned, RN Outcome: Completed/Met 05/22/2023 1145 by Gregor Learned, RN Outcome: Progressing   Problem: Clinical Measurements: Goal: Ability to  maintain clinical measurements within normal limits will improve 05/22/2023 1636 by Gregor Learned, RN Outcome: Completed/Met 05/22/2023 1145 by Gregor Learned, RN Outcome: Progressing Goal: Postoperative complications will be avoided or minimized 05/22/2023 1636 by Gregor Learned, RN Outcome: Completed/Met 05/22/2023 1145 by Gregor Learned, RN Outcome: Progressing Goal: Diagnostic test results will improve 05/22/2023 1636 by Gregor Learned, RN Outcome: Completed/Met 05/22/2023 1145 by Gregor Learned, RN Outcome: Progressing   Problem: Pain Management: Goal: Pain level will decrease 05/22/2023 1636 by Gregor Learned, RN Outcome: Completed/Met 05/22/2023 1145 by Gregor Learned, RN Outcome: Progressing   Problem: Skin Integrity: Goal: Will show signs of wound healing 05/22/2023 1636 by Gregor Learned, RN Outcome: Completed/Met 05/22/2023 1145 by Gregor Learned, RN Outcome: Progressing   Problem: Health Behavior/Discharge Planning: Goal: Identification of resources available to assist in meeting health care needs will improve 05/22/2023 1636 by Gregor Learned, RN Outcome: Completed/Met 05/22/2023 1145 by Gregor Learned, RN Outcome: Progressing   Problem: Bladder/Genitourinary: Goal: Urinary functional status for  postoperative course will improve 05/22/2023 1636 by Gregor Learned, RN Outcome: Completed/Met 05/22/2023 1145 by Gregor Learned, RN Outcome: Progressing

## 2023-05-22 NOTE — H&P (Signed)
 Sharon Rivers is an 53 y.o. female.   Chief Complaint: Back and left leg pain HPI: 53 year old female with back and left leg pain rating down S1 nerve root pattern.  Workup revealed disc radiation L5-S1 displacing the left S1 nerve root.  Due to the patient's progression of clinical syndrome imaging findings of a conservative treatment I recommended laminectomy microdiscectomy at L5-S1 on the left.  I extensively reviewed the risks and benefits of the operation with the patient as well as perioperative course expectations of outcome and alternatives of surgery and she understood and agreed to proceed forward.  Past Medical History:  Diagnosis Date   Anxiety    Back pain    Chest pain    a. 07/2015 Myoview: EF 71%, medium size, mild intensity, partially reversible septal defect with overlying breast attenuation -> likely artifact. No significant reversible ischemia.   Constipation    Depression    Edema    feet and legs   Frequent headaches    GERD (gastroesophageal reflux disease)    History of DVT (deep vein thrombosis) 2018   Hypertension    Hypothyroidism    Insulin resistance    Joint pain    Migraines    OSA (obstructive sleep apnea) 03/17/2016   Osteoarthritis    Panic anxiety syndrome    PONV (postoperative nausea and vomiting)    Pulmonary embolism (HCC) 2022   SOB (shortness of breath)    a. 04/2016 Echo: EF 60-65%, Gr1 DD.   Swallowing difficulty     Past Surgical History:  Procedure Laterality Date   CESAREAN SECTION  2001 & 2002   DILATION AND CURETTAGE OF UTERUS N/A 09/08/2020   Procedure: DILATATION AND CURETTAGE;  Surgeon: Willodean Rosenthal, MD;  Location: [facility] OR;  Service: Gynecology;  Laterality: N/A;   ELBOW SURGERY     HYSTEROSCOPY WITH NOVASURE N/A 09/08/2020   Procedure: HYSTEROSCOPY WITH NOVASURE;  Surgeon: Willodean Rosenthal, MD;  Location: [facility] OR;  Service: Gynecology;  Laterality: N/A;   MINOR CARPAL TUNNEL     pinched nerve in elbow   WISDOM  TOOTH EXTRACTION      Family History  Problem Relation Age of Onset   Hypertension Mother        Living   Arthritis Mother    Thyroid disease Mother    Heart disease Mother        MI at age 31   Heart attack Mother    Migraines Mother    Depression Mother    Obesity Mother    Congestive Heart Failure Father        ?CHF   Hypertension Maternal Grandmother    Parkinson's disease Maternal Grandmother    Alzheimer's disease Maternal Grandmother    Cancer Maternal Grandfather    Hypertension Maternal Grandfather    Hypertension Paternal Grandmother    Hypertension Paternal Grandfather    Gout Brother    ADD / ADHD Son        x1   Other Son        #2-Unknown   Diabetes Neg Hx    Social History:  reports that she quit smoking about 33 years ago. Her smoking use included cigarettes. She has never used smokeless tobacco. She reports that she does not drink alcohol and does not use drugs.  Allergies:  Allergies  Allergen Reactions   Megace [Megestrol] Other (See Comments)    Pt is s/p PE and DVT on low dose Megace   Adhesive [Tape] Other (See  Comments)    Skin Burn. DO NOT USE PAPER TAPE , BUT surgical tape is ok    Biofreeze [Menthol (Topical Analgesic)] Other (See Comments)    Skin Burn.   Camphor Other (See Comments)    Burning sensation.   Folic Acid Hives   Lisinopril Cough   Menthol Other (See Comments)    Burning sensation.   Sulfa Antibiotics Rash    Medications Prior to Admission  Medication Sig Dispense Refill   acetaminophen (TYLENOL) 325 MG tablet Take 2 tablets (650 mg total) by mouth every 6 (six) hours as needed for mild pain (or Fever >/= 101). 20 tablet 0   ALPRAZolam (XANAX) 0.25 MG tablet TAKE 1 TABLET BY MOUTH TWICE DAILY AS NEEDED FOR ANXIETY AND FOR SLEEP 30 tablet 0   amLODipine (NORVASC) 5 MG tablet Take 1 tablet by mouth once daily 90 tablet 0   azelastine (ASTELIN) 0.1 % nasal spray Place 1 spray into both nostrils 2 (two) times daily. Use in  each nostril as directed (Patient taking differently: Place 1 spray into both nostrils 2 (two) times daily as needed for rhinitis or allergies. Use in each nostril as directed) 30 mL 5   Cholecalciferol (VITAMIN D3) 50 MCG (2000 UT) capsule Take 1 capsule (2,000 Units total) by mouth daily.     docusate sodium (COLACE) 100 MG capsule Take 100 mg by mouth 2 (two) times daily as needed for mild constipation.     fluticasone (FLONASE) 50 MCG/ACT nasal spray Place 2 sprays into both nostrils daily. (Patient taking differently: Place 2 sprays into both nostrils daily as needed for allergies.) 16 g 6   furosemide (LASIX) 20 MG tablet Take 1 tablet by mouth once daily 90 tablet 3   hydrochlorothiazide (MICROZIDE) 12.5 MG capsule Take 1 capsule by mouth once daily 90 capsule 2   hydrOXYzine (VISTARIL) 25 MG capsule TAKE 1 CAPSULE BY MOUTH AT BEDTIME AS NEEDED FOR SLEEP (Patient taking differently: Take 25 mg by mouth at bedtime.) 90 capsule 0   levothyroxine (SYNTHROID) 200 MCG tablet Take 1 tablet by mouth once daily 90 tablet 0   levothyroxine (SYNTHROID) 25 MCG tablet Take 1 tablet by mouth once daily 90 tablet 0   methocarbamol (ROBAXIN) 500 MG tablet TAKE 1 TABLET BY MOUTH EVERY 8 HOURS AS NEEDED FOR MUSCLE SPASM 20 tablet 0   montelukast (SINGULAIR) 10 MG tablet TAKE 1 TABLET BY MOUTH AT BEDTIME 90 tablet 0   Multiple Vitamins-Minerals (MULTIVITAMIN WITH MINERALS) tablet Take 1 tablet by mouth daily.     nystatin (MYCOSTATIN/NYSTOP) powder Apply 1 application topically 3 (three) times daily. (Patient taking differently: Apply 1 application  topically 3 (three) times daily as needed (irritation).) 60 g 1   ondansetron (ZOFRAN) 4 MG tablet Take 1 tablet (4 mg total) by mouth every 6 (six) hours as needed for nausea. 20 tablet 0   Polyvinyl Alcohol-Povidone PF (REFRESH) 1.4-0.6 % SOLN Place 1-2 drops into both eyes 3 (three) times daily as needed (dry/irritated eyes).     potassium chloride (KLOR-CON) 10  MEQ tablet Take 10 mEq by mouth daily.     rivaroxaban (XARELTO) 20 MG TABS tablet TAKE 1 TABLET BY MOUTH ONCE DAILY WITH SUPPER 30 tablet 5   rizatriptan (MAXALT) 10 MG tablet As needed for headache; may repeat in 2 hours if needed; max 2 per day or 8 per month 8 tablet 6   SEMAGLUTIDE-WEIGHT MANAGEMENT [place] 20 mg once a week. [facility] (compounding  pharmacy)     sertraline (ZOLOFT) 100 MG tablet TAKE 1 & 1/2 (ONE & ONE-HALF) TABLETS BY MOUTH ONCE DAILY (Patient taking differently: Take 100 mg by mouth daily.) 135 tablet 0   topiramate (TOPAMAX) 50 MG tablet Take 1 tablet (50 mg total) by mouth 2 (two) times daily. 180 tablet 3   traZODone (DESYREL) 50 MG tablet TAKE 1 TABLET BY MOUTH AT BEDTIME 90 tablet 0   valsartan (DIOVAN) 320 MG tablet Take 1 tablet (320 mg total) by mouth daily. 90 tablet 0    Results for orders placed or performed during the hospital encounter of 05/22/23 (from the past 48 hours)  Protime-INR     Status: None   Collection Time: 05/22/23  5:50 AM  Result Value Ref Range   Prothrombin Time 14.1 11.4 - 15.2 seconds   INR 1.1 0.8 - 1.2    Comment: (NOTE) INR goal varies based on device and disease states. Performed at [facility], [address] [place], [place] [address]    No results found.  Review of Systems  Musculoskeletal:  Positive for back pain.  Neurological:  Positive for numbness.    Blood pressure 132/76, pulse 68, temperature 97.8 F (36.6 C), temperature source Oral, resp. rate 17, height 5\' 2"  (1.575 m), weight 134.3 kg, last menstrual period 10/12/2020, SpO2 98%. Physical Exam HENT:     Head: Normocephalic.     Right Ear: Tympanic membrane normal.     Nose: Nose normal.     Mouth/Throat:     Mouth: Mucous membranes are moist.  Cardiovascular:     Rate and Rhythm: Normal rate.     Pulses: Normal pulses.  Pulmonary:     Effort: Pulmonary effort is normal.  Neurological:     Mental Status: She is alert.      Comments: Strength 5 out of 5 iliopsoas, quads, hamstrings, gastrocs, and tibialis, and EHL.      Assessment/Plan 53 year old presents for L5-S1 left-sided microdiscectomy  Ferris Hua, MD 05/22/2023, 7:22 AM

## 2023-05-22 NOTE — Op Note (Signed)
 Preoperative diagnosis: Herniated nucleus pulposus L5-S1 left.  Postoperative diagnosis: Same.  Procedure: Lumbar laminectomy microdiscectomy L5-S1 on the left with microdissection of the left S1 nerve root microscopic discectomy.  Surgeon: Gearl Keens.  Assistant: Audie Bleacher.  Anesthesia: General.  EBL: Minimal.  HPI: 53 year old female progressive worsening back and left leg pain rating down S1 nerve root pattern workup revealed disc herniation L5-S1 displacing the left S1 nerve root.  Due to the patient's progression of clinical syndrome imaging findings and failed conservative treatment I recommended limited to microdiscectomy at L5-S1 on the left.  I extensively reviewed the risk and benefits of the operation with the patient as well as perioperative course expectations of outcome and alternatives to surgery and she understood and agreed to proceed forward.  Operative procedure: Patient was brought into the OR was induced under general anesthesia positioned prone on the Wilson frame her back was prepped and draped in routine sterile fashion.  Preoperative x-ray localized the appropriate level so after infiltration of 10 cc lidocaine with epi a midline incision was made and Bovie electrocautery was used to take down the subcutaneous tissue and subperiosteal dissection was carried lamina of L5 and S1 on the left.  Interoperative x-ray confirmed indication appropriate levels of the inferior aspect of the L5 medial facet complex superior aspect of the lamina of S1 was drilled out with a high-speed drill laminotomy was begun with a 3 Miller Kerrison punch.  Ligament flavum was identified and removed in piecemeal fashion.  Operating microscope was draped and brought into the field under microscopic lamination under by the medial guttural identification of the S1 nerve root in the S1 pedicle.  Marching superiorly identified the disc base and a large partially calcified disc and is still partially  contained with the ligament was displacing the left S1 nerve root so annulotomy was made with a 11 blade scalpel a 2 mm Kerrison was used to bite off the posterior dorsal calcified component of the annulus and the allowed identification and entry into the disc base level several large fragments removed from both central compartment and the space decompress the thecal sac and left S1 nerve root.  The foramina was explored with a coronary dilator as well as medial laterally superiorly and inferiorly to confirm with wide decompression the wounds and copiously irrigated meticulous hemostasis was maintained Gelfoam was ON top of the dura the muscle and fascia approximate layers with Vicryl skin was closed running 4 subcuticular Dermabond benzoin Steri-Strips and a sterile dressing was applied patient to cover him in stable condition.  At the end the case all needle count sponge counts were correct.

## 2023-05-22 NOTE — Evaluation (Signed)
 Occupational Therapy Evaluation Patient Details Name: Sharon Rivers MRN: [id number] DOB: [birth year] Today's Date: 05/22/2023   History of Present Illness   53 yo F s/p Microdiscectomy - Lumbar Five-Sacral One - left.   PMH includes: HTN, DM type 2, hypothyroidism.     Clinical Impressions Patient admitted for the procedure above.  PTA she lives at home with her SO, who can assist as needed.  Patient is experiencing no discomfort and is essentially at her baseline for ADL completion and Mod I for mobility on the unit.  No OT needs exist in the acute setting and no post acute OT is anticipated.  Patient with good understanding of precautions, and recommend follow up with MD as prescribed.       If plan is discharge home, recommend the following:   Assist for transportation;Assistance with cooking/housework     Functional Status Assessment   Patient has not had a recent decline in their functional status     Equipment Recommendations   None recommended by OT     Recommendations for Other Services         Precautions/Restrictions   Precautions Precautions: Fall Recall of Precautions/Restrictions: Intact Restrictions Weight Bearing Restrictions Per Provider Order: No     Mobility Bed Mobility Overal bed mobility: Modified Independent                  Transfers Overall transfer level: Modified independent Equipment used: None                      Balance Overall balance assessment: Mild deficits observed, not formally tested                                         ADL either performed or assessed with clinical judgement   ADL Overall ADL's : At baseline                                             Vision Patient Visual Report: No change from baseline       Perception Perception: Not tested       Praxis Praxis: Not tested       Pertinent Vitals/Pain Pain Assessment Pain Assessment: No/denies  pain     Extremity/Trunk Assessment Upper Extremity Assessment Upper Extremity Assessment: Overall WFL for tasks assessed   Lower Extremity Assessment Lower Extremity Assessment: Overall WFL for tasks assessed   Cervical / Trunk Assessment Cervical / Trunk Assessment: Back Surgery   Communication Communication Communication: No apparent difficulties   Cognition Arousal: Alert Behavior During Therapy: WFL for tasks assessed/performed Cognition: No apparent impairments                               Following commands: Intact       Cueing  General Comments   Cueing Techniques: Verbal cues   VSS on RA   Exercises     Shoulder Instructions      Home Living Family/patient expects to be discharged to:: Private residence Living Arrangements: Other relatives Available Help at Discharge: Family;Available 24 hours/day Type of Home: House Home Access: Stairs to enter [employer] of Steps: 1   Home Layout: One level  Bathroom Shower/Tub: Automotive engineer: Yes How Accessible: Accessible via walker Home Equipment: Shower seat          Prior Functioning/Environment Prior Level of Function : Independent/Modified Independent                    OT Problem List: Pain   OT Treatment/Interventions:        OT Goals(Current goals can be found in the care plan section)   Acute Rehab OT Goals Patient Stated Goal: Return home OT Goal Formulation: With patient Time For Goal Achievement: 05/26/23 Potential to Achieve Goals: Good   OT Frequency:       Co-evaluation              AM-PAC OT "6 Clicks" Daily Activity     Outcome Measure Help from another person eating meals?: None Help from another person taking care of personal grooming?: None Help from another person toileting, which includes using toliet, bedpan, or urinal?: None Help from another person bathing (including  washing, rinsing, drying)?: None Help from another person to put on and taking off regular upper body clothing?: None Help from another person to put on and taking off regular lower body clothing?: None 6 Click Score: 24   End of Session Nurse Communication: Mobility status  Activity Tolerance: Patient tolerated treatment well Patient left: in bed;with call bell/phone within reach;with family/visitor present  OT Visit Diagnosis: Unsteadiness on feet (R26.81)                Time: 0960-4540 OT Time Calculation (min): 28 min Charges:  OT General Charges $OT Visit: 1 Visit OT Evaluation $OT Eval Moderate Complexity: 1 Mod OT Treatments $Self Care/Home Management : 8-22 mins  05/22/2023  RP, OTR/L  Acute Rehabilitation Services  Office:  [phone]   Benjamen Brand 05/22/2023, 3:04 PM

## 2023-05-22 NOTE — Plan of Care (Signed)

## 2023-05-22 NOTE — Anesthesia Postprocedure Evaluation (Signed)
 Anesthesia Post Note  Patient: Arienne Gartin  Procedure(s) Performed: Microdiscectomy - Lumbar Five-Sacral One - left (Left: Back)     Patient location during evaluation: PACU Anesthesia Type: General Level of consciousness: awake and alert Pain management: pain level controlled Vital Signs Assessment: post-procedure vital signs reviewed and stable Respiratory status: spontaneous breathing, nonlabored ventilation, respiratory function stable and patient connected to nasal cannula oxygen Cardiovascular status: blood pressure returned to baseline and stable Postop Assessment: no apparent nausea or vomiting Anesthetic complications: no   No notable events documented.  Last Vitals:  Vitals:   05/22/23 1000 05/22/23 1016  BP:  135/89  Pulse:  65  Resp:  18  Temp: (!) 36.4 C 36.6 C  SpO2:  97%    Last Pain:  Vitals:   05/22/23 1000  TempSrc:   PainSc: 2                  Lethaniel Rave

## 2023-05-22 NOTE — Discharge Summary (Signed)
 Physician Discharge Summary  Patient ID: Sharon Rivers MRN: [id number] DOB/AGE: [birth year] 52 y.o. Estimated body mass index is 54.14 kg/m as calculated from the following:   Height as of this encounter: 5\' 2"  (1.575 m).   Weight as of this encounter: 134.3 kg.   Admit date: 05/22/2023 Discharge date: 05/22/2023  Admission Diagnoses: Herniated nucleus pulposus L5-S1 left  Discharge Diagnoses: Same Principal Problem:   HNP (herniated nucleus pulposus), lumbar   Discharged Condition: good  Hospital Course: Patient was admitted to hospital underwent laminectomy microdiscectomy L5-S1 left postoperative patient did very well coming for in the floor was ambulating voiding spontaneously tolerating regular diet and stable for discharge home.  Patient will be discharged scheduled follow-up 1 to 2 weeks.  Consults: Significant Diagnostic Studies: Treatments: Laminectomy discectomy L5-S1 left Discharge Exam: Blood pressure 135/89, pulse 65, temperature 97.8 F (36.6 C), resp. rate 18, height 5\' 2"  (1.575 m), weight 134.3 kg, last menstrual period 10/12/2020, SpO2 97%. Strength 5 out of 5 and clean dry and intact  Disposition: Home   Allergies as of 05/22/2023       Reactions   Megace [megestrol] Other (See Comments)   Pt is s/p PE and DVT on low dose Megace   Adhesive [tape] Other (See Comments)   Skin Burn. DO NOT USE PAPER TAPE , BUT surgical tape is ok    Biofreeze [menthol (topical Analgesic)] Other (See Comments)   Skin Burn.   Camphor Other (See Comments)   Burning sensation.   Folic Acid Hives   Lisinopril Cough   Menthol Other (See Comments)   Burning sensation.   Sulfa Antibiotics Rash        Medication List     TAKE these medications    acetaminophen 325 MG tablet Commonly known as: TYLENOL Take 2 tablets (650 mg total) by mouth every 6 (six) hours as needed for mild pain (or Fever >/= 101).   ALPRAZolam 0.25 MG tablet Commonly known as: XANAX TAKE 1  TABLET BY MOUTH TWICE DAILY AS NEEDED FOR ANXIETY AND FOR SLEEP   amLODipine 5 MG tablet Commonly known as: NORVASC Take 1 tablet by mouth once daily   azelastine 0.1 % nasal spray Commonly known as: ASTELIN Place 1 spray into both nostrils 2 (two) times daily. Use in each nostril as directed What changed:  when to take this reasons to take this   chlorhexidine 4 % external liquid Commonly known as: HIBICLENS Apply 15 mLs (1 Application total) topically as directed for 30 doses. Use as directed daily for 5 days every other week for 6 weeks.   docusate sodium 100 MG capsule Commonly known as: COLACE Take 100 mg by mouth 2 (two) times daily as needed for mild constipation.   fluticasone 50 MCG/ACT nasal spray Commonly known as: FLONASE Place 2 sprays into both nostrils daily. What changed:  when to take this reasons to take this   furosemide 20 MG tablet Commonly known as: LASIX Take 1 tablet by mouth once daily   hydrochlorothiazide 12.5 MG capsule Commonly known as: MICROZIDE Take 1 capsule by mouth once daily   HYDROcodone-acetaminophen 5-325 MG tablet Commonly known as: NORCO/VICODIN Take 2 tablets by mouth every 4 (four) hours as needed for severe pain (pain score 7-10).   hydrOXYzine 25 MG capsule Commonly known as: VISTARIL TAKE 1 CAPSULE BY MOUTH AT BEDTIME AS NEEDED FOR SLEEP What changed: See the new instructions.   levothyroxine 200 MCG tablet Commonly known as: SYNTHROID Take 1 tablet by  mouth once daily   levothyroxine 25 MCG tablet Commonly known as: SYNTHROID Take 1 tablet by mouth once daily   methocarbamol 500 MG tablet Commonly known as: ROBAXIN TAKE 1 TABLET BY MOUTH EVERY 8 HOURS AS NEEDED FOR MUSCLE SPASM What changed: Another medication with the same name was added. Make sure you understand how and when to take each.   methocarbamol 500 MG tablet Commonly known as: ROBAXIN Take 1 tablet (500 mg total) by mouth every 8 (eight) hours as  needed for muscle spasms. What changed: You were already taking a medication with the same name, and this prescription was added. Make sure you understand how and when to take each.   montelukast 10 MG tablet Commonly known as: SINGULAIR TAKE 1 TABLET BY MOUTH AT BEDTIME   multivitamin with minerals tablet Take 1 tablet by mouth daily.   mupirocin ointment 2 % Commonly known as: BACTROBAN Place 1 Application into the nose 2 (two) times daily for 60 doses. Use as directed 2 times daily for 5 days every other week for 6 weeks.   nystatin powder Commonly known as: MYCOSTATIN/NYSTOP Apply 1 application topically 3 (three) times daily. What changed:  when to take this reasons to take this   ondansetron 4 MG tablet Commonly known as: ZOFRAN Take 1 tablet (4 mg total) by mouth every 6 (six) hours as needed for nausea.   potassium chloride 10 MEQ tablet Commonly known as: KLOR-CON Take 10 mEq by mouth daily.   Refresh 1.4-0.6 % Soln Generic drug: Polyvinyl Alcohol-Povidone PF Place 1-2 drops into both eyes 3 (three) times daily as needed (dry/irritated eyes).   rizatriptan 10 MG tablet Commonly known as: MAXALT As needed for headache; may repeat in 2 hours if needed; max 2 per day or 8 per month   SEMAGLUTIDE-WEIGHT MANAGEMENT [place] 20 mg once a week. [facility] (compounding pharmacy)   sertraline 100 MG tablet Commonly known as: ZOLOFT TAKE 1 & 1/2 (ONE & ONE-HALF) TABLETS BY MOUTH ONCE DAILY What changed: See the new instructions.   topiramate 50 MG tablet Commonly known as: TOPAMAX Take 1 tablet (50 mg total) by mouth 2 (two) times daily.   traZODone 50 MG tablet Commonly known as: DESYREL TAKE 1 TABLET BY MOUTH AT BEDTIME   valsartan 320 MG tablet Commonly known as: DIOVAN Take 1 tablet (320 mg total) by mouth daily.   Vitamin D3 50 MCG (2000 UT) capsule Take 1 capsule (2,000 Units total) by mouth daily.   Xarelto 20 MG Tabs tablet Generic drug:  rivaroxaban TAKE 1 TABLET BY MOUTH ONCE DAILY WITH SUPPER         Signed: Ferris Hua 05/22/2023, 4:07 PM

## 2023-05-23 ENCOUNTER — Encounter (HOSPITAL_COMMUNITY): Payer: Self-pay | Admitting: Neurosurgery

## 2023-05-23 MED FILL — Thrombin For Soln 5000 Unit: CUTANEOUS | Qty: 2 | Status: AC

## 2023-05-31 ENCOUNTER — Telehealth: Admitting: Family

## 2023-05-31 DIAGNOSIS — N760 Acute vaginitis: Secondary | ICD-10-CM | POA: Diagnosis not present

## 2023-05-31 MED ORDER — FLUCONAZOLE 150 MG PO TABS: ORAL_TABLET | ORAL | 0 refills | Status: DC

## 2023-05-31 NOTE — Progress Notes (Signed)
 MyChart Video Visit    Virtual Visit via Video Note    Patient location: Home. Patient and provider in visit Provider location: Office  I discussed the limitations of evaluation and management by telemedicine and the availability of in person appointments. The patient expressed understanding and agreed to proceed.  Visit Date: 05/31/2023  Today's healthcare provider: Rochele Christmas, NP     Subjective:    Patient ID: Sharon Rivers, female    DOB: 06/23/1970, 53 y.o.   MRN: [id number]  Chief Complaint  Patient presents with   Vaginal Pain    Patient complains of vaginal soreness    Vaginal Pain    Sharon Rivers is a 53 year old female who presents with vulvar soreness and swelling following recent back surgery.  She experiences swelling and tenderness in the vulvar area, with a noticeable lump on one side, particularly felt when wiping. She notes that this has been present before but generally doesn't bother her.There is no burning sensation during urination, no itching, and no abnormal vaginal discharge.  She recently underwent back surgery (L5/S1 microdiscectomy) on 05/22/23 and has been experiencing soreness at the base of her spine. This soreness affects her ability to stand, sit, and lie down to sleep which is why she could not come into the office today for in person exam. She is unable to determine if the back pain is better than before the surgery.  Past Medical History:  Diagnosis Date   Anxiety    Back pain    Chest pain    a. 07/2015 Myoview : EF 71%, medium size, mild intensity, partially reversible septal defect with overlying breast attenuation -> likely artifact. No significant reversible ischemia.   Constipation    Depression    Edema    feet and legs   Frequent headaches    GERD (gastroesophageal reflux disease)    History of DVT (deep vein thrombosis) 2018   Hypertension    Hypothyroidism    Insulin  resistance    Joint pain    Migraines     OSA (obstructive sleep apnea) 03/17/2016   Osteoarthritis    Panic anxiety syndrome    PONV (postoperative nausea and vomiting)    Pulmonary embolism (HCC) 2022   SOB (shortness of breath)    a. 04/2016 Echo: EF 60-65%, Gr1 DD.   Swallowing difficulty     Past Surgical History:  Procedure Laterality Date   CESAREAN SECTION  2001 & 2002   DILATION AND CURETTAGE OF UTERUS N/A 09/08/2020   Procedure: DILATATION AND CURETTAGE;  Surgeon: Lenord Radon, MD;  Location: [facility] OR;  Service: Gynecology;  Laterality: N/A;   ELBOW SURGERY     HYSTEROSCOPY WITH NOVASURE N/A 09/08/2020   Procedure: HYSTEROSCOPY WITH NOVASURE;  Surgeon: Lenord Radon, MD;  Location: [facility] OR;  Service: Gynecology;  Laterality: N/A;   LUMBAR LAMINECTOMY/DECOMPRESSION MICRODISCECTOMY Left 05/22/2023   Procedure: Microdiscectomy - Lumbar Five-Sacral One - left;  Surgeon: Gearl Keens, MD;  Location: [facility] OR;  Service: Neurosurgery;  Laterality: Left;  Microdiscectomy - L5-S1 - left   MINOR CARPAL TUNNEL     pinched nerve in elbow   WISDOM TOOTH EXTRACTION      Family History  Problem Relation Age of Onset   Hypertension Mother        Living   Arthritis Mother    Thyroid  disease Mother    Heart disease Mother        MI at age 72   Heart attack Mother  Migraines Mother    Depression Mother    Obesity Mother    Congestive Heart Failure Father        ?CHF   Hypertension Maternal Grandmother    Parkinson's disease Maternal Grandmother    Alzheimer's disease Maternal Grandmother    Cancer Maternal Grandfather    Hypertension Maternal Grandfather    Hypertension Paternal Grandmother    Hypertension Paternal Grandfather    Gout Brother    ADD / ADHD Son        x1   Other Son        #2-Unknown   Diabetes Neg Hx     Social History   Socioeconomic History   Marital status: Single    Spouse name: Not on file   Number of children: 2   Years of education: Not on file   Highest education  level: Associate degree: academic program  Occupational History   Occupation: Tree surgeon    Comment: [employer]  Tobacco Use   Smoking status: Former    Current packs/day: 0.00    Types: Cigarettes    Quit date: 1992    Years since quitting: 33.3   Smokeless tobacco: Never   Tobacco comments:    quit 1992  Vaping Use   Vaping status: Never Used  Substance and Sexual Activity   Alcohol  use: No    Alcohol /week: 0.0 standard drinks of alcohol    Drug use: No   Sexual activity: Yes    Partners: Male    Birth control/protection: None  Other Topics Concern   Not on file  Social History Narrative   She has 2 children (grown). She did not retain custody of these children.   Lives with boyfriend in [place].   Works in Audiological scientist at [employer]: Low Risk  (05/02/2023)   Overall Financial Resource Strain (CARDIA)    Difficulty of Paying Living Expenses: Not hard at all  Food Insecurity: No Food Insecurity (05/02/2023)   Hunger Vital Sign    Worried About Running Out of Food in the Last Year: Never true    Ran Out of Food in the Last Year: Never true  Transportation Needs: No Transportation Needs (05/02/2023)   PRAPARE - Administrator, Civil Service (Medical): No    Lack of Transportation (Non-Medical): No  Physical Activity: Unknown (05/02/2023)   Exercise Vital Sign    Days of Exercise per Week: 0 days    Minutes of Exercise per Session: Not on file  Stress: No Stress Concern Present (05/02/2023)   [employer] of Occupational Health - Occupational Stress Questionnaire    Feeling of Stress : Only a little  Social Connections: Moderately Integrated (05/02/2023)   Social Connection and Isolation Panel [NHANES]    Frequency of Communication with Friends and Family: More than three times a week    Frequency of Social Gatherings with Friends and Family: More than three times a week    Attends Religious Services: 1 to 4  times per year    Active Member of [employer] or Organizations: No    Attends Banker Meetings: Not on file    Marital Status: Living with partner  Intimate Partner Violence: Unknown (07/15/2022)   Received from [employer], [facility]   HITS    Physically Hurt: Not on file    Insult or Talk Down To: Not on file    Threaten Physical Harm: Not on file  Scream or Curse: Not on file    Outpatient Medications Prior to Visit  Medication Sig Dispense Refill   acetaminophen  (TYLENOL ) 325 MG tablet Take 2 tablets (650 mg total) by mouth every 6 (six) hours as needed for mild pain (or Fever >/= 101). 20 tablet 0   ALPRAZolam  (XANAX ) 0.25 MG tablet TAKE 1 TABLET BY MOUTH TWICE DAILY AS NEEDED FOR ANXIETY AND FOR SLEEP 30 tablet 0   amLODipine  (NORVASC ) 5 MG tablet Take 1 tablet by mouth once daily 90 tablet 0   azelastine  (ASTELIN ) 0.1 % nasal spray Place 1 spray into both nostrils 2 (two) times daily. Use in each nostril as directed (Patient taking differently: Place 1 spray into both nostrils 2 (two) times daily as needed for rhinitis or allergies. Use in each nostril as directed) 30 mL 5   chlorhexidine  (HIBICLENS ) 4 % external liquid Apply 15 mLs (1 Application total) topically as directed for 30 doses. Use as directed daily for 5 days every other week for 6 weeks. 946 mL 1   Cholecalciferol  (VITAMIN D3) 50 MCG (2000 UT) capsule Take 1 capsule (2,000 Units total) by mouth daily.     docusate sodium  (COLACE) 100 MG capsule Take 100 mg by mouth 2 (two) times daily as needed for mild constipation.     fluticasone  (FLONASE ) 50 MCG/ACT nasal spray Place 2 sprays into both nostrils daily. (Patient taking differently: Place 2 sprays into both nostrils daily as needed for allergies.) 16 g 6   furosemide  (LASIX ) 20 MG tablet Take 1 tablet by mouth once daily 90 tablet 3   hydrochlorothiazide  (MICROZIDE ) 12.5 MG capsule Take 1 capsule by mouth once daily 90 capsule 2   HYDROcodone -acetaminophen   (NORCO/VICODIN) 5-325 MG tablet Take 2 tablets by mouth every 4 (four) hours as needed for severe pain (pain score 7-10). 30 tablet 0   hydrOXYzine  (VISTARIL ) 25 MG capsule TAKE 1 CAPSULE BY MOUTH AT BEDTIME AS NEEDED FOR SLEEP (Patient taking differently: Take 25 mg by mouth at bedtime.) 90 capsule 0   levothyroxine  (SYNTHROID ) 200 MCG tablet Take 1 tablet by mouth once daily 90 tablet 0   levothyroxine  (SYNTHROID ) 25 MCG tablet Take 1 tablet by mouth once daily 90 tablet 0   methocarbamol  (ROBAXIN ) 500 MG tablet TAKE 1 TABLET BY MOUTH EVERY 8 HOURS AS NEEDED FOR MUSCLE SPASM 20 tablet 0   methocarbamol  (ROBAXIN ) 500 MG tablet Take 1 tablet (500 mg total) by mouth every 8 (eight) hours as needed for muscle spasms. 40 tablet 1   montelukast  (SINGULAIR ) 10 MG tablet TAKE 1 TABLET BY MOUTH AT BEDTIME 90 tablet 0   Multiple Vitamins-Minerals (MULTIVITAMIN WITH MINERALS) tablet Take 1 tablet by mouth daily.     mupirocin  ointment (BACTROBAN ) 2 % Place 1 Application into the nose 2 (two) times daily for 60 doses. Use as directed 2 times daily for 5 days every other week for 6 weeks. 60 g 0   nystatin  (MYCOSTATIN /NYSTOP ) powder Apply 1 application topically 3 (three) times daily. (Patient taking differently: Apply 1 application  topically 3 (three) times daily as needed (irritation).) 60 g 1   ondansetron  (ZOFRAN ) 4 MG tablet Take 1 tablet (4 mg total) by mouth every 6 (six) hours as needed for nausea. 20 tablet 0   Polyvinyl Alcohol -Povidone PF (REFRESH) 1.4-0.6 % SOLN Place 1-2 drops into both eyes 3 (three) times daily as needed (dry/irritated eyes).     potassium chloride  (KLOR-CON ) 10 MEQ tablet Take 10 mEq by mouth daily.  rivaroxaban  (XARELTO ) 20 MG TABS tablet TAKE 1 TABLET BY MOUTH ONCE DAILY WITH SUPPER 30 tablet 5   rizatriptan  (MAXALT ) 10 MG tablet As needed for headache; may repeat in 2 hours if needed; max 2 per day or 8 per month 8 tablet 6   SEMAGLUTIDE -WEIGHT MANAGEMENT [place] 20 mg once a  week. [facility] (compounding pharmacy)     sertraline  (ZOLOFT ) 100 MG tablet TAKE 1 & 1/2 (ONE & ONE-HALF) TABLETS BY MOUTH ONCE DAILY (Patient taking differently: Take 100 mg by mouth daily.) 135 tablet 0   topiramate  (TOPAMAX ) 50 MG tablet Take 1 tablet (50 mg total) by mouth 2 (two) times daily. 180 tablet 3   traZODone  (DESYREL ) 50 MG tablet TAKE 1 TABLET BY MOUTH AT BEDTIME 90 tablet 0   valsartan  (DIOVAN ) 320 MG tablet Take 1 tablet (320 mg total) by mouth daily. 90 tablet 0   No facility-administered medications prior to visit.    Allergies  Allergen Reactions   Megace  [Megestrol ] Other (See Comments)    Pt is s/p PE and DVT on low dose Megace    Adhesive [Tape] Other (See Comments)    Skin Burn. DO NOT USE PAPER TAPE , BUT surgical tape is ok    Biofreeze [Menthol  (Topical Analgesic)] Other (See Comments)    Skin Burn.   Camphor Other (See Comments)    Burning sensation.   Folic Acid  Hives   Lisinopril  Cough   Menthol  Other (See Comments)    Burning sensation.   Sulfa Antibiotics Rash    Review of Systems  Genitourinary:  Positive for vaginal pain.       Objective:    Physical Exam  LMP  (LMP Unknown) Comment: pt is unaware of last cycle Wt Readings from Last 3 Encounters:  05/22/23 296 lb (134.3 kg)  05/15/23 299 lb (135.6 kg)  05/09/23 (!) 306 lb 6.4 oz (139 kg)   Gen: Awake, alert, no acute distress Resp: Breathing is even and non-labored Psych: calm/pleasant demeanor Neuro: Alert and Oriented x 3, + facial symmetry, speech is clear. GU: not examined     Assessment & Plan:   Problem List Items Addressed This Visit       Unprioritized   Acute vaginitis - Primary    Vulvar soreness likely due to vaginal yeast infection following antibiotics administered perioperatively.  - Prescribed Diflucan , one dose today, repeat in three days if symptoms persist. - Advised examination in office if no improvement or worsening.      Relevant  Medications   fluconazole  (DIFLUCAN ) 150 MG tablet    I am having Sharon Rivers start on fluconazole . I am also having her maintain her fluticasone , azelastine , acetaminophen , ondansetron , nystatin , docusate sodium , Refresh, Vitamin D3, multivitamin with minerals, topiramate , rizatriptan , methocarbamol , sertraline , furosemide , levothyroxine , levothyroxine , montelukast , Xarelto , amLODipine , traZODone , hydrOXYzine , valsartan , ALPRAZolam , hydrochlorothiazide , SEMAGLUTIDE -WEIGHT MANAGEMENT , potassium chloride , mupirocin  ointment, chlorhexidine , methocarbamol , and HYDROcodone -acetaminophen .  Meds ordered this encounter  Medications   fluconazole  (DIFLUCAN ) 150 MG tablet    Sig: Take 1 tablet by mouth today, repeat in 3 days    Dispense:  2 tablet    Refill:  0    Supervising Provider:   Randie Bustle A [[id number]]    I discussed the assessment and treatment plan with the patient. The patient was provided an opportunity to ask questions and all were answered. The patient agreed with the plan and demonstrated an understanding of the instructions.   The patient was advised to call back or  seek an in-person evaluation if the symptoms worsen or if the condition fails to improve as anticipated.   Rochele Christmas, NP [facility] [phone] (phone) [phone] (fax)  [facility]

## 2023-05-31 NOTE — Assessment & Plan Note (Signed)
  Vulvar soreness likely due to vaginal yeast infection following antibiotics administered perioperatively.  - Prescribed Diflucan , one dose today, repeat in three days if symptoms persist. - Advised examination in office if no improvement or worsening.

## 2023-05-31 NOTE — Patient Instructions (Signed)
 VISIT SUMMARY:  Today, you were seen for vulvar soreness and swelling following your recent back surgery. You reported experiencing swelling and tenderness in the vulvar area, with a noticeable lump on one side, but no burning sensation during urination, itching, or abnormal discharge. Additionally, you have been experiencing soreness at the base of your spine, which affects your ability to stand, sit, and lie down to sleep.  YOUR PLAN:  -POSTOPERATIVE PAIN: Postoperative pain refers to the discomfort you are experiencing at the base of your spine following your back surgery. This pain is affecting your mobility and comfort. Please follow up as scheduled with your Neurosurgeon.  -VULVAR SORENESS AND TENDERNESS: Vulvar soreness and tenderness may be due to a possible yeast infection, which can occur after taking antibiotics. You have been prescribed Diflucan , which is an antifungal medication. Take one dose today and repeat in three days if your symptoms persist. If there is no improvement or if your symptoms worsen, please come in for an examination.  INSTRUCTIONS:  Please monitor your pain levels and mobility. If your pain persists, we may consider prescribing pain relievers. For your vulvar soreness, take the prescribed Diflucan  as directed. If there is no improvement or if your symptoms worsen, schedule an examination.

## 2023-06-06 ENCOUNTER — Ambulatory Visit (HOSPITAL_COMMUNITY)
Admission: RE | Admit: 2023-06-06 | Discharge: 2023-06-06 | Disposition: A | Discharge status: Home / Self Care | Source: Ambulatory Visit | Attending: Vascular Surgery | Admitting: Vascular Surgery

## 2023-06-06 ENCOUNTER — Other Ambulatory Visit (HOSPITAL_COMMUNITY): Payer: Self-pay | Admitting: Neurosurgery

## 2023-06-06 DIAGNOSIS — R609 Edema, unspecified: Secondary | ICD-10-CM

## 2023-06-23 ENCOUNTER — Other Ambulatory Visit: Payer: Self-pay | Admitting: Family

## 2023-06-23 DIAGNOSIS — F419 Anxiety disorder, unspecified: Secondary | ICD-10-CM

## 2023-06-23 DIAGNOSIS — J309 Allergic rhinitis, unspecified: Secondary | ICD-10-CM

## 2023-06-23 DIAGNOSIS — G4700 Insomnia, unspecified: Secondary | ICD-10-CM

## 2023-06-23 DIAGNOSIS — I10 Essential (primary) hypertension: Secondary | ICD-10-CM

## 2023-06-23 DIAGNOSIS — E039 Hypothyroidism, unspecified: Secondary | ICD-10-CM

## 2023-06-28 ENCOUNTER — Encounter: Payer: Self-pay | Admitting: Family

## 2023-06-29 MED ORDER — TOPIRAMATE 50 MG PO TABS: 50.0000 mg | ORAL_TABLET | Freq: Two times a day (BID) | ORAL | 1 refills | Status: DC

## 2023-07-26 LAB — HM MAMMOGRAPHY

## 2023-07-27 ENCOUNTER — Other Ambulatory Visit: Payer: Self-pay | Admitting: Family

## 2023-07-27 DIAGNOSIS — F419 Anxiety disorder, unspecified: Secondary | ICD-10-CM

## 2023-07-27 NOTE — Telephone Encounter (Signed)
 Requesting: alprazolam  0.25mg   Contract: 09/02/22 UDS: 09/02/22 Last Visit: 05/31/23 Next Visit: None Last Refill: 06/25/23 #30 and 0RF  Please Advise

## 2023-08-04 ENCOUNTER — Telehealth: Payer: Self-pay | Admitting: Family

## 2023-08-04 NOTE — Telephone Encounter (Signed)
 Please advise pt to call [facility] [phone] to schedule additional mammogram images. Original mammo incomplete from 6/18.

## 2023-08-08 NOTE — Telephone Encounter (Signed)
 Called but no answer, left detailed message for patient to call [facility] to set up appointment. Phone number provided

## 2023-09-17 ENCOUNTER — Other Ambulatory Visit: Payer: Self-pay | Admitting: Family

## 2023-09-17 DIAGNOSIS — F419 Anxiety disorder, unspecified: Secondary | ICD-10-CM

## 2023-09-26 ENCOUNTER — Encounter: Admitting: Family

## 2023-10-22 ENCOUNTER — Encounter: Payer: Self-pay | Admitting: Family

## 2023-10-23 NOTE — Telephone Encounter (Signed)
 Can you please put placard form in my folder for her?

## 2023-10-26 ENCOUNTER — Other Ambulatory Visit: Payer: Self-pay | Admitting: Family

## 2023-10-26 DIAGNOSIS — G4700 Insomnia, unspecified: Secondary | ICD-10-CM

## 2023-10-27 ENCOUNTER — Other Ambulatory Visit: Payer: Self-pay | Admitting: Family

## 2023-10-27 DIAGNOSIS — Z86718 Personal history of other venous thrombosis and embolism: Secondary | ICD-10-CM

## 2023-10-27 DIAGNOSIS — F419 Anxiety disorder, unspecified: Secondary | ICD-10-CM

## 2023-10-29 NOTE — Telephone Encounter (Signed)
 Sharon Rivers,   I believe that is in the paperwork I gave you on Friday. Thanks!  [name]

## 2023-11-07 ENCOUNTER — Ambulatory Visit (INDEPENDENT_AMBULATORY_CARE_PROVIDER_SITE_OTHER): Admitting: Family

## 2023-11-07 ENCOUNTER — Encounter: Payer: Self-pay | Admitting: Family

## 2023-11-07 DIAGNOSIS — G4733 Obstructive sleep apnea (adult) (pediatric): Secondary | ICD-10-CM | POA: Diagnosis not present

## 2023-11-07 DIAGNOSIS — Z86718 Personal history of other venous thrombosis and embolism: Secondary | ICD-10-CM

## 2023-11-07 DIAGNOSIS — E559 Vitamin D deficiency, unspecified: Secondary | ICD-10-CM | POA: Diagnosis not present

## 2023-11-07 DIAGNOSIS — E039 Hypothyroidism, unspecified: Secondary | ICD-10-CM

## 2023-11-07 DIAGNOSIS — G4700 Insomnia, unspecified: Secondary | ICD-10-CM

## 2023-11-07 DIAGNOSIS — K219 Gastro-esophageal reflux disease without esophagitis: Secondary | ICD-10-CM

## 2023-11-07 DIAGNOSIS — J309 Allergic rhinitis, unspecified: Secondary | ICD-10-CM

## 2023-11-07 DIAGNOSIS — F419 Anxiety disorder, unspecified: Secondary | ICD-10-CM

## 2023-11-07 DIAGNOSIS — G43809 Other migraine, not intractable, without status migrainosus: Secondary | ICD-10-CM

## 2023-11-07 DIAGNOSIS — I10 Essential (primary) hypertension: Secondary | ICD-10-CM | POA: Diagnosis not present

## 2023-11-07 DIAGNOSIS — F3289 Other specified depressive episodes: Secondary | ICD-10-CM

## 2023-11-07 MED ORDER — SERTRALINE HCL 100 MG PO TABS: 100.0000 mg | ORAL_TABLET | Freq: Every day | ORAL | 1 refills | Status: DC

## 2023-11-07 MED ORDER — QUETIAPINE FUMARATE 25 MG PO TABS: 25.0000 mg | ORAL_TABLET | Freq: Every day | ORAL | 1 refills | Status: DC

## 2023-11-07 MED ORDER — RIZATRIPTAN BENZOATE 10 MG PO TABS: ORAL_TABLET | ORAL | 6 refills | Status: AC

## 2023-11-07 MED ORDER — ONDANSETRON HCL 4 MG PO TABS: 4.0000 mg | ORAL_TABLET | Freq: Four times a day (QID) | ORAL | 1 refills | Status: AC | PRN

## 2023-11-07 MED ORDER — RIVAROXABAN 20 MG PO TABS: 20.0000 mg | ORAL_TABLET | Freq: Every day | ORAL | 1 refills | Status: AC

## 2023-11-07 MED ORDER — SEMAGLUTIDE-WEIGHT MANAGEMENT 0.5 MG/0.5ML ~~LOC~~ SOAJ: 0.5000 mg | SUBCUTANEOUS | Status: AC

## 2023-11-07 MED ORDER — TOPIRAMATE 50 MG PO TABS: 50.0000 mg | ORAL_TABLET | Freq: Two times a day (BID) | ORAL | 1 refills | Status: AC

## 2023-11-07 MED ORDER — POTASSIUM CHLORIDE ER 10 MEQ PO TBCR: 10.0000 meq | EXTENDED_RELEASE_TABLET | Freq: Every day | ORAL | 1 refills | Status: AC

## 2023-11-07 MED ORDER — VALSARTAN 320 MG PO TABS: 320.0000 mg | ORAL_TABLET | Freq: Every day | ORAL | 1 refills | Status: AC

## 2023-11-07 MED ORDER — MONTELUKAST SODIUM 10 MG PO TABS: 10.0000 mg | ORAL_TABLET | Freq: Every day | ORAL | 1 refills | Status: AC

## 2023-11-07 NOTE — Assessment & Plan Note (Signed)
 Improved.  Continues singulair .  Not on antihistamine.

## 2023-11-07 NOTE — Patient Instructions (Signed)
 VISIT SUMMARY:  Today, we reviewed your medications and overall health. We discussed your blood pressure, sleep issues, weight loss, headaches, mood, anxiety, allergies, and thyroid  function.  YOUR PLAN:  ESSENTIAL HYPERTENSION: Your blood pressure has been low due to your recent weight loss. -Stop taking amlodipine . -Check your blood pressure at home and report if it goes above 150 mmHg. -Recheck your blood pressure in two weeks with a nurse.  OBSTRUCTIVE SLEEP APNEA: Your sleep quality is good with the CPAP machine. -Continue using your CPAP machine.  OBESITY: You have lost a significant amount of weight with semaglutide . -Continue taking semaglutide  50 units weekly.  MIGRAINE: You have been experiencing nagging headaches that are not typical migraines. -Continue using Maxalt  and Zofran  as needed for migraine and nausea.  MAJOR DEPRESSIVE DISORDER AND INSOMNIA: You have been feeling irritable and having trouble sleeping, especially on weekends. -Stop taking hydroxyzine  and trazodone . -Start taking low-dose Seroquel. -Continue taking sertraline . -Continue taking Xanax  as needed. -Follow up in one month to assess your response to Seroquel.  GENERALIZED ANXIETY DISORDER: Your anxiety is not a major issue right now, but we are focusing on your mood and sleep. -Continue taking Xanax  as needed.  ALLERGIC RHINITIS: Your allergies are well-controlled with CPAP modifications and Singulair . -Continue taking Singulair .  HYPOTHYROIDISM: You are currently taking 225 mcg of Synthroid  for your thyroid . -Update your thyroid  function tests. -Adjust your Synthroid  dose if necessary after reviewing the test results.

## 2023-11-07 NOTE — Assessment & Plan Note (Signed)
 Some irritability. Not sleeping well at night despite trazodone , xanax  and hydroxyzine .  D/C trazodone , d/c hydroxyzine , add seroquel 25mg  HS  for depression and insomnia.

## 2023-11-07 NOTE — Assessment & Plan Note (Signed)
 Continues CPAP, has not followed back up with sleep specialist. Declines to see sleep specialist.

## 2023-11-07 NOTE — Assessment & Plan Note (Signed)
 Improved since starting cpap, not on medication for GERD.

## 2023-11-07 NOTE — Assessment & Plan Note (Addendum)
 She is doing well with weight loss which is being managed by another provider and compounded GLP1.

## 2023-11-07 NOTE — Assessment & Plan Note (Signed)
 Stable on sertraline.

## 2023-11-07 NOTE — Assessment & Plan Note (Signed)
 Overtreated, d/c amlodipine . Continue hydrochlorothiazide  and valsartan .

## 2023-11-07 NOTE — Assessment & Plan Note (Signed)
 Uncontrolled. Trial of seroquel HS. May use xanax  prn but d/c trazodone  and hydroxyzine .

## 2023-11-07 NOTE — Assessment & Plan Note (Signed)
 Lab Results  Component Value Date   TSH 5.21 08/23/2022   On synthroid , update TSH.

## 2023-11-07 NOTE — Progress Notes (Signed)
 Subjective:     Patient ID: Sharon Rivers, female    DOB: 09-Jul-1970, 53 y.o.   MRN: [id number]  Chief Complaint  Patient presents with   Annual Exam    HPI  Discussed the use of AI scribe software for clinical note transcription with the patient, who gave verbal consent to proceed.  History of Present Illness  Sharon Rivers is a 53 year old female who presents for a physical exam and medication review.  She has lost approximately 40 pounds since March due to semaglutide , administered at 50 units weekly. She experiences low blood pressure at home while on hydrochlorothiazide , valsartan , and amlodipine .  She has difficulty falling and staying asleep on weekends despite using a CPAP machine and taking trazodone , hydroxyzine , and Xanax . Sleep quality is better during the week.  She experiences non-migraine headaches unresponsive to Tylenol . She recently obtained new glasses with blue light filters.  Her mood is irritable despite taking sertraline . Anxiety is managed with Xanax  as needed.  Allergies are controlled with CPAP and Singulair . She avoids additional antihistamines.  She takes Synthroid , vitamin D , and potassium. Maxalt  and Zofran  are used for migraine and nausea management.     Health Maintenance Due  Topic Date Due   Hepatitis B Vaccines 19-59 Average Risk (1 of 3 - 19+ 3-dose series) Never done   Pneumococcal Vaccine: 50+ Years (1 of 1 - PCV) Never done    Past Medical History:  Diagnosis Date   Anxiety    Back pain    Chest pain    a. 07/2015 Myoview : EF 71%, medium size, mild intensity, partially reversible septal defect with overlying breast attenuation -> likely artifact. No significant reversible ischemia.   Constipation    Depression    Edema    feet and legs   Frequent headaches    GERD (gastroesophageal reflux disease)    History of DVT (deep vein thrombosis) 2018   Hypertension    Hypothyroidism    Insulin  resistance    Joint pain     Migraines    OSA (obstructive sleep apnea) 03/17/2016   Osteoarthritis    Panic anxiety syndrome    PONV (postoperative nausea and vomiting)    Pulmonary embolism (HCC) 2022   SOB (shortness of breath)    a. 04/2016 Echo: EF 60-65%, Gr1 DD.   Swallowing difficulty    Trigeminal neuralgia 04/21/2020    Past Surgical History:  Procedure Laterality Date   CESAREAN SECTION  2001 & 2002   DILATION AND CURETTAGE OF UTERUS N/A 09/08/2020   Procedure: DILATATION AND CURETTAGE;  Surgeon: Corene Coy, MD;  Location: [facility] OR;  Service: Gynecology;  Laterality: N/A;   ELBOW SURGERY     HYSTEROSCOPY WITH NOVASURE N/A 09/08/2020   Procedure: HYSTEROSCOPY WITH NOVASURE;  Surgeon: Corene Coy, MD;  Location: [facility] OR;  Service: Gynecology;  Laterality: N/A;   LUMBAR LAMINECTOMY/DECOMPRESSION MICRODISCECTOMY Left 05/22/2023   Procedure: Microdiscectomy - Lumbar Five-Sacral One - left;  Surgeon: Onetha Kuba, MD;  Location: [facility];  Service: Neurosurgery;  Laterality: Left;  Microdiscectomy - L5-S1 - left   MINOR CARPAL TUNNEL     pinched nerve in elbow   WISDOM TOOTH EXTRACTION      Family History  Problem Relation Age of Onset   Hypertension Mother        Living   Arthritis Mother    Thyroid  disease Mother    Heart disease Mother        MI at age 19  Heart attack Mother    Migraines Mother    Depression Mother    Obesity Mother    Gallstones Mother    CAD Mother    Neuropathy Mother    Peripheral Artery Disease Mother    Congestive Heart Failure Father        ?CHF   Gout Brother    Hypertension Maternal Grandmother    Parkinson's disease Maternal Grandmother    Alzheimer's disease Maternal Grandmother    Arthritis Maternal Grandmother    Pancreatitis Maternal Grandmother    Cancer Maternal Grandfather        metastatic, primary unknown   Hypertension Maternal Grandfather    CAD Maternal Grandfather    Hypertension Paternal Grandmother    Hypertension Paternal  Grandfather    ADD / ADHD Son        x1   Other Son        #2-Unknown   Diabetes Neg Hx     Social History   Socioeconomic History   Marital status: Single    Spouse name: Not on file   Number of children: 2   Years of education: Not on file   Highest education level: Associate degree: academic program  Occupational History   Occupation: Tree surgeon    Comment: [employer]  Tobacco Use   Smoking status: Former    Current packs/day: 0.00    Types: Cigarettes    Quit date: 1992    Years since quitting: 33.7   Smokeless tobacco: Never   Tobacco comments:    quit 1992  Vaping Use   Vaping status: Never Used  Substance and Sexual Activity   Alcohol  use: No    Alcohol /week: 0.0 standard drinks of alcohol    Drug use: No   Sexual activity: Yes    Partners: Male    Birth control/protection: None  Other Topics Concern   Not on file  Social History Narrative   She has 2 children (grown). She did not retain custody of these children.   Lives with boyfriend in [place].   Works in Audiological scientist at [employer]: Low Risk  (10/31/2023)   Overall Financial Resource Strain (CARDIA)    Difficulty of Paying Living Expenses: Not very hard  Food Insecurity: No Food Insecurity (10/31/2023)   Hunger Vital Sign    Worried About Running Out of Food in the Last Year: Never true    Ran Out of Food in the Last Year: Never true  Transportation Needs: No Transportation Needs (10/31/2023)   PRAPARE - Administrator, Civil Service (Medical): No    Lack of Transportation (Non-Medical): No  Physical Activity: Insufficiently Active (10/31/2023)   Exercise Vital Sign    Days of Exercise per Week: 3 days    Minutes of Exercise per Session: 10 min  Stress: Stress Concern Present (10/31/2023)   [employer] of Occupational Health - Occupational Stress Questionnaire    Feeling of Stress: Rather much  Social Connections: Moderately Isolated  (10/31/2023)   Social Connection and Isolation Panel    Frequency of Communication with Friends and Family: More than three times a week    Frequency of Social Gatherings with Friends and Family: Twice a week    Attends Religious Services: Patient declined    Database administrator or Organizations: No    Attends Engineer, structural: Not on file    Marital Status: Living with partner  Intimate Partner  Violence: Unknown (07/15/2022)   Received from [facility]   HITS    Physically Hurt: Not on file    Insult or Talk Down To: Not on file    Threaten Physical Harm: Not on file    Scream or Curse: Not on file    Outpatient Medications Prior to Visit  Medication Sig Dispense Refill   acetaminophen  (TYLENOL ) 325 MG tablet Take 2 tablets (650 mg total) by mouth every 6 (six) hours as needed for mild pain (or Fever >/= 101). 20 tablet 0   ALPRAZolam  (XANAX ) 0.25 MG tablet TAKE 1 TABLET BY MOUTH TWICE DAILY AS NEEDED FOR ANXIETY AND  FOR  SLEEP 30 tablet 0   azelastine  (ASTELIN ) 0.1 % nasal spray Place 1 spray into both nostrils 2 (two) times daily. Use in each nostril as directed (Patient taking differently: Place 1 spray into both nostrils 2 (two) times daily as needed for rhinitis or allergies. Use in each nostril as directed) 30 mL 5   chlorhexidine  (HIBICLENS ) 4 % external liquid Apply 15 mLs (1 Application total) topically as directed for 30 doses. Use as directed daily for 5 days every other week for 6 weeks. 946 mL 1   Cholecalciferol  (VITAMIN D3) 50 MCG (2000 UT) capsule Take 1 capsule (2,000 Units total) by mouth daily.     docusate sodium  (COLACE) 100 MG capsule Take 100 mg by mouth 2 (two) times daily as needed for mild constipation.     furosemide  (LASIX ) 20 MG tablet Take 1 tablet by mouth once daily 90 tablet 3   hydrochlorothiazide  (MICROZIDE ) 12.5 MG capsule Take 1 capsule by mouth once daily 90 capsule 2   levothyroxine  (SYNTHROID ) 200 MCG tablet Take 1 tablet by mouth  once daily 90 tablet 0   levothyroxine  (SYNTHROID ) 25 MCG tablet Take 1 tablet by mouth once daily 90 tablet 0   methocarbamol  (ROBAXIN ) 500 MG tablet Take 1 tablet (500 mg total) by mouth every 8 (eight) hours as needed for muscle spasms. 40 tablet 1   Multiple Vitamins-Minerals (MULTIVITAMIN WITH MINERALS) tablet Take 1 tablet by mouth daily.     nystatin  (MYCOSTATIN /NYSTOP ) powder Apply 1 application topically 3 (three) times daily. (Patient taking differently: Apply 1 application  topically 3 (three) times daily as needed (irritation).) 60 g 1   Polyvinyl Alcohol -Povidone PF (REFRESH) 1.4-0.6 % SOLN Place 1-2 drops into both eyes 3 (three) times daily as needed (dry/irritated eyes).     amLODipine  (NORVASC ) 5 MG tablet Take 1 tablet by mouth once daily 90 tablet 0   fluticasone  (FLONASE ) 50 MCG/ACT nasal spray Place 2 sprays into both nostrils daily. (Patient taking differently: Place 2 sprays into both nostrils daily as needed for allergies.) 16 g 6   hydrOXYzine  (VISTARIL ) 25 MG capsule TAKE 1 CAPSULE BY MOUTH EVERY DAY AT BEDTIME AS NEEDED FOR SLEEP 90 capsule 0   montelukast  (SINGULAIR ) 10 MG tablet TAKE 1 TABLET BY MOUTH ONCE DAILY AT BEDTIME 90 tablet 0   ondansetron  (ZOFRAN ) 4 MG tablet Take 1 tablet (4 mg total) by mouth every 6 (six) hours as needed for nausea. 20 tablet 0   potassium chloride  (KLOR-CON ) 10 MEQ tablet Take 10 mEq by mouth daily.     rizatriptan  (MAXALT ) 10 MG tablet As needed for headache; may repeat in 2 hours if needed; max 2 per day or 8 per month 8 tablet 6   SEMAGLUTIDE -WEIGHT MANAGEMENT [place] 20 mg once a week. [facility] (compounding pharmacy)  sertraline  (ZOLOFT ) 100 MG tablet TAKE 1 & 1/2 (ONE & ONE-HALF) TABLETS BY MOUTH ONCE DAILY (Patient taking differently: Take 100 mg by mouth daily.) 135 tablet 0   topiramate  (TOPAMAX ) 50 MG tablet Take 1 tablet (50 mg total) by mouth 2 (two) times daily. 180 tablet 1   traZODone  (DESYREL ) 50 MG tablet TAKE  1 TABLET BY MOUTH AT BEDTIME 90 tablet 0   valsartan  (DIOVAN ) 320 MG tablet Take 1 tablet by mouth once daily 90 tablet 0   XARELTO  20 MG TABS tablet TAKE 1 TABLET BY MOUTH ONCE DAILY WITH SUPPER 30 tablet 0   fluconazole  (DIFLUCAN ) 150 MG tablet Take 1 tablet by mouth today, repeat in 3 days 2 tablet 0   HYDROcodone -acetaminophen  (NORCO/VICODIN) 5-325 MG tablet Take 2 tablets by mouth every 4 (four) hours as needed for severe pain (pain score 7-10). 30 tablet 0   methocarbamol  (ROBAXIN ) 500 MG tablet TAKE 1 TABLET BY MOUTH EVERY 8 HOURS AS NEEDED FOR MUSCLE SPASM 20 tablet 0   No facility-administered medications prior to visit.    Allergies  Allergen Reactions   Megace  [Megestrol ] Other (See Comments)    Pt is s/p PE and DVT on low dose Megace    Adhesive [Tape] Other (See Comments)    Skin Burn. DO NOT USE PAPER TAPE , BUT surgical tape is ok    Biofreeze [Menthol  (Topical Analgesic)] Other (See Comments)    Skin Burn.   Camphor Other (See Comments)    Burning sensation.   Folic Acid  Hives   Lisinopril  Cough   Menthol  Other (See Comments)    Burning sensation.   Sulfa Antibiotics Rash    ROS    See HPI Objective:    Physical Exam Constitutional:      General: She is not in acute distress.    Appearance: Normal appearance. She is well-developed.  HENT:     Head: Normocephalic and atraumatic.     Right Ear: External ear normal.     Left Ear: External ear normal.  Eyes:     General: No scleral icterus. Neck:     Thyroid : No thyromegaly.  Cardiovascular:     Rate and Rhythm: Normal rate and regular rhythm.     Heart sounds: Normal heart sounds. No murmur heard. Pulmonary:     Effort: Pulmonary effort is normal. No respiratory distress.     Breath sounds: Normal breath sounds. No wheezing.  Musculoskeletal:     Cervical back: Neck supple.  Skin:    General: Skin is warm and dry.  Neurological:     Mental Status: She is alert and oriented to person, place, and  time.  Psychiatric:        Mood and Affect: Mood normal.        Behavior: Behavior normal.        Thought Content: Thought content normal.        Judgment: Judgment normal.      BP (!) 88/53   Pulse 68   Temp 98.4 F (36.9 C) (Oral)   Resp 16   Ht 5' 2 (1.575 m)   Wt 266 lb (120.7 kg)   LMP  (LMP Unknown) Comment: pt is unaware of last cycle  SpO2 100%   BMI 48.65 kg/m  Wt Readings from Last 3 Encounters:  11/07/23 266 lb (120.7 kg)  05/22/23 296 lb (134.3 kg)  05/15/23 299 lb (135.6 kg)       Assessment & Plan:   Problem List  Items Addressed This Visit       Unprioritized   Vitamin D  deficiency   Relevant Orders   Vitamin D  (25 hydroxy)   OSA (obstructive sleep apnea)   Continues CPAP, has not followed back up with sleep specialist. Declines to see sleep specialist.       Morbid obesity (HCC) - Primary   She is doing well with weight loss which is being managed by another provider and compounded GLP1.       Relevant Medications   semaglutide -weight management (WEGOVY ) 0.5 MG/0.5ML SOAJ SQ injection   Migraines   No recent migraines, does have some headaches.  Keeps maxalt  on hand for prn use. Uses Zofran  prn nausea during migraines.       Relevant Medications   rivaroxaban  (XARELTO ) 20 MG TABS tablet   valsartan  (DIOVAN ) 320 MG tablet   sertraline  (ZOLOFT ) 100 MG tablet   topiramate  (TOPAMAX ) 50 MG tablet   rizatriptan  (MAXALT ) 10 MG tablet   ondansetron  (ZOFRAN ) 4 MG tablet   Insomnia   Uncontrolled. Trial of seroquel HS. May use xanax  prn but d/c trazodone  and hydroxyzine .       Relevant Medications   QUEtiapine (SEROQUEL) 25 MG tablet   Hypothyroid   Lab Results  Component Value Date   TSH 5.21 08/23/2022   On synthroid , update TSH.       Relevant Orders   Comp Met (CMET)   Hypertension   Overtreated, d/c amlodipine . Continue hydrochlorothiazide  and valsartan .       Relevant Medications   rivaroxaban  (XARELTO ) 20 MG TABS tablet    valsartan  (DIOVAN ) 320 MG tablet   potassium chloride  (KLOR-CON ) 10 MEQ tablet   Other Relevant Orders   TSH   History of DVT (deep vein thrombosis)   Relevant Medications   rivaroxaban  (XARELTO ) 20 MG TABS tablet   RESOLVED: GERD (gastroesophageal reflux disease)   Improved since starting cpap, not on medication for GERD.       Relevant Medications   ondansetron  (ZOFRAN ) 4 MG tablet   Depression   Some irritability. Not sleeping well at night despite trazodone , xanax  and hydroxyzine .  D/C trazodone , d/c hydroxyzine , add seroquel 25mg  HS  for depression and insomnia.       Relevant Medications   QUEtiapine (SEROQUEL) 25 MG tablet   sertraline  (ZOLOFT ) 100 MG tablet   Anxiety   Stable on sertraline .        Relevant Medications   sertraline  (ZOLOFT ) 100 MG tablet   Allergic rhinitis   Improved.  Continues singulair .  Not on antihistamine.       Relevant Medications   montelukast  (SINGULAIR ) 10 MG tablet    I have discontinued Sharon Rivers's fluticasone , traZODone , HYDROcodone -acetaminophen , fluconazole , amLODipine , and hydrOXYzine . I have changed her SEMAGLUTIDE -WEIGHT MANAGEMENT [place] to semaglutide -weight management (WEGOVY ) 0.5 MG/0.5ML SOAJ SQ injection and Xarelto  to rivaroxaban . I have also changed her montelukast , valsartan , sertraline , and potassium chloride . Additionally, I am having her start on QUEtiapine. Lastly, I am having her maintain her azelastine , acetaminophen , nystatin , docusate sodium , Refresh, Vitamin D3, multivitamin with minerals, furosemide , hydrochlorothiazide , chlorhexidine , methocarbamol , levothyroxine , levothyroxine , ALPRAZolam , topiramate , rizatriptan , and ondansetron .  Meds ordered this encounter  Medications   semaglutide -weight management (WEGOVY ) 0.5 MG/0.5ML SOAJ SQ injection    Sig: Inject 0.5 mg into the skin once a week. 50 units weekly from [facility] (compounding pharmacy)    Supervising Provider:   DOMENICA BLACKBIRD A [[id number]]    QUEtiapine (SEROQUEL) 25 MG tablet    Sig:  Take 1 tablet (25 mg total) by mouth at bedtime.    Dispense:  30 tablet    Refill:  1    Supervising Provider:   DOMENICA BLACKBIRD A [[id number]]   montelukast  (SINGULAIR ) 10 MG tablet    Sig: Take 1 tablet (10 mg total) by mouth at bedtime.    Dispense:  90 tablet    Refill:  1    Supervising Provider:   DOMENICA BLACKBIRD A [[id number]]   rivaroxaban  (XARELTO ) 20 MG TABS tablet    Sig: Take 1 tablet (20 mg total) by mouth daily with supper.    Dispense:  90 tablet    Refill:  1    Supervising Provider:   DOMENICA BLACKBIRD A [[id number]]   valsartan  (DIOVAN ) 320 MG tablet    Sig: Take 1 tablet (320 mg total) by mouth daily.    Dispense:  90 tablet    Refill:  1    Supervising Provider:   DOMENICA BLACKBIRD A [[id number]]   sertraline  (ZOLOFT ) 100 MG tablet    Sig: Take 1 tablet (100 mg total) by mouth daily. TAKE 1 & 1/2 (ONE & ONE-HALF) TABLETS BY MOUTH ONCE DAILY    Dispense:  90 tablet    Refill:  1    Supervising Provider:   DOMENICA BLACKBIRD A [[id number]]   topiramate  (TOPAMAX ) 50 MG tablet    Sig: Take 1 tablet (50 mg total) by mouth 2 (two) times daily.    Dispense:  180 tablet    Refill:  1    Supervising Provider:   DOMENICA BLACKBIRD A [[id number]]   rizatriptan  (MAXALT ) 10 MG tablet    Sig: As needed for headache; may repeat in 2 hours if needed; max 2 per day or 8 per month    Dispense:  8 tablet    Refill:  6    Supervising Provider:   DOMENICA BLACKBIRD A [[id number]]   potassium chloride  (KLOR-CON ) 10 MEQ tablet    Sig: Take 1 tablet (10 mEq total) by mouth daily.    Dispense:  90 tablet    Refill:  1    Supervising Provider:   DOMENICA BLACKBIRD A [[id number]]   ondansetron  (ZOFRAN ) 4 MG tablet    Sig: Take 1 tablet (4 mg total) by mouth every 6 (six) hours as needed for nausea.    Dispense:  20 tablet    Refill:  1    Supervising Provider:   DOMENICA BLACKBIRD A [[id number]]

## 2023-11-07 NOTE — Assessment & Plan Note (Addendum)
 No recent migraines, does have some headaches.  Keeps maxalt  on hand for prn use. Uses Zofran  prn nausea during migraines.

## 2023-11-08 LAB — COMPREHENSIVE METABOLIC PANEL WITH GFR
ALT: 14 U/L (ref 0–35)
AST: 11 U/L (ref 0–37)
Albumin: 3.9 g/dL (ref 3.5–5.2)
Alkaline Phosphatase: 70 U/L (ref 39–117)
BUN: 19 mg/dL (ref 6–23)
CO2: 29 meq/L (ref 19–32)
Calcium: 9.2 mg/dL (ref 8.4–10.5)
Chloride: 103 meq/L (ref 96–112)
Creatinine, Ser: 1.18 mg/dL (ref 0.40–1.20)
GFR: 53.32 mL/min — ABNORMAL LOW (ref >60.00)
Glucose, Bld: 85 mg/dL (ref 70–99)
Potassium: 3.8 meq/L (ref 3.5–5.1)
Sodium: 140 meq/L (ref 135–145)
Total Bilirubin: 0.4 mg/dL (ref 0.2–1.2)
Total Protein: 7.2 g/dL (ref 6.0–8.3)

## 2023-11-08 LAB — TSH: TSH: 0.73 u[IU]/mL (ref 0.35–5.50)

## 2023-11-08 LAB — VITAMIN D 25 HYDROXY (VIT D DEFICIENCY, FRACTURES): VITD: 19.53 ng/mL — ABNORMAL LOW (ref 30.00–100.00)

## 2023-11-09 ENCOUNTER — Other Ambulatory Visit: Payer: Self-pay | Admitting: Family

## 2023-11-09 ENCOUNTER — Ambulatory Visit: Payer: Self-pay | Admitting: Family

## 2023-11-09 DIAGNOSIS — E039 Hypothyroidism, unspecified: Secondary | ICD-10-CM

## 2023-11-09 DIAGNOSIS — E559 Vitamin D deficiency, unspecified: Secondary | ICD-10-CM

## 2023-11-09 MED ORDER — LEVOTHYROXINE SODIUM 25 MCG PO TABS: 25.0000 ug | ORAL_TABLET | Freq: Every day | ORAL | 0 refills | Status: AC

## 2023-11-09 MED ORDER — VITAMIN D (ERGOCALCIFEROL) 1.25 MG (50000 UNIT) PO CAPS: 50000.0000 [IU] | ORAL_CAPSULE | ORAL | 0 refills | Status: DC

## 2023-11-09 MED ORDER — LEVOTHYROXINE SODIUM 200 MCG PO TABS: 200.0000 ug | ORAL_TABLET | Freq: Every day | ORAL | 0 refills | Status: AC

## 2023-11-09 NOTE — Telephone Encounter (Signed)
 Vitamin D  level is low.  Advise patient to begin vit D 50000 units once weekly for 12 weeks, then repeat vit D level (dx Vit D deficiency).    Thyroid  testing looks good- I sent refill on synthroid .  Kidney function is mildly reduced but stable. Good blood pressure control and avoiding anti-inflammatory medications such as ibuprofen/Aleve  is important for kidney health.

## 2023-11-09 NOTE — Telephone Encounter (Signed)
 Patient notified of results, new prescription and she will follow up in one month as scheduled

## 2023-11-10 ENCOUNTER — Telehealth: Payer: Self-pay | Admitting: Family

## 2023-11-10 NOTE — Telephone Encounter (Signed)
 Copied from CRM [id number]. Topic: Clinical - Medication Question >> Nov 10, 2023  2:44 PM Alfonso ORN wrote: Reason for CRM: Curlee from [facility] called to get clarification on prescription for sertraline  (ZOLOFT ) 100 MG tablet as it had two sets of instructions on it. Contact number: [phone] fax number: [phone]

## 2023-11-13 NOTE — Telephone Encounter (Signed)
 Ashley at [facility] notified

## 2023-11-14 ENCOUNTER — Other Ambulatory Visit: Payer: Self-pay

## 2023-11-14 ENCOUNTER — Encounter: Payer: Self-pay | Admitting: Family

## 2023-11-14 DIAGNOSIS — G4700 Insomnia, unspecified: Secondary | ICD-10-CM

## 2023-11-14 DIAGNOSIS — F419 Anxiety disorder, unspecified: Secondary | ICD-10-CM

## 2023-11-14 DIAGNOSIS — F3289 Other specified depressive episodes: Secondary | ICD-10-CM

## 2023-11-14 MED ORDER — SERTRALINE HCL 100 MG PO TABS: 100.0000 mg | ORAL_TABLET | Freq: Every day | ORAL | 1 refills | Status: AC

## 2023-11-15 MED ORDER — TRAZODONE HCL 100 MG PO TABS: 100.0000 mg | ORAL_TABLET | Freq: Every day | ORAL | 0 refills | Status: DC

## 2023-11-15 NOTE — Addendum Note (Signed)
 Addended by: DARYL SETTER on: 11/15/2023 09:17 AM   Modules accepted: Orders

## 2023-11-16 ENCOUNTER — Encounter: Payer: Self-pay | Admitting: Family

## 2023-12-06 ENCOUNTER — Other Ambulatory Visit (HOSPITAL_COMMUNITY)
Admission: RE | Admit: 2023-12-06 | Discharge: 2023-12-06 | Disposition: A | Discharge status: Home / Self Care | Source: Ambulatory Visit | Attending: Family | Admitting: Family

## 2023-12-06 ENCOUNTER — Ambulatory Visit: Admitting: Family

## 2023-12-06 ENCOUNTER — Encounter: Payer: Self-pay | Admitting: Family

## 2023-12-06 VITALS — BP 128/73 | HR 70 | Temp 98.6°F | Resp 16 | Ht 62.0 in | Wt 273.0 lb

## 2023-12-06 DIAGNOSIS — Z01419 Encounter for gynecological examination (general) (routine) without abnormal findings: Secondary | ICD-10-CM | POA: Diagnosis present

## 2023-12-06 DIAGNOSIS — Z86718 Personal history of other venous thrombosis and embolism: Secondary | ICD-10-CM

## 2023-12-06 DIAGNOSIS — G4733 Obstructive sleep apnea (adult) (pediatric): Secondary | ICD-10-CM

## 2023-12-06 DIAGNOSIS — Z1272 Encounter for screening for malignant neoplasm of vagina: Secondary | ICD-10-CM | POA: Diagnosis present

## 2023-12-06 DIAGNOSIS — M1990 Unspecified osteoarthritis, unspecified site: Secondary | ICD-10-CM | POA: Diagnosis not present

## 2023-12-06 DIAGNOSIS — Z0000 Encounter for general adult medical examination without abnormal findings: Secondary | ICD-10-CM | POA: Diagnosis not present

## 2023-12-06 DIAGNOSIS — E559 Vitamin D deficiency, unspecified: Secondary | ICD-10-CM | POA: Diagnosis not present

## 2023-12-06 DIAGNOSIS — F3289 Other specified depressive episodes: Secondary | ICD-10-CM

## 2023-12-06 DIAGNOSIS — I10 Essential (primary) hypertension: Secondary | ICD-10-CM

## 2023-12-06 DIAGNOSIS — E039 Hypothyroidism, unspecified: Secondary | ICD-10-CM

## 2023-12-06 DIAGNOSIS — F419 Anxiety disorder, unspecified: Secondary | ICD-10-CM

## 2023-12-06 DIAGNOSIS — Z6841 Body Mass Index (BMI) 40.0 and over, adult: Secondary | ICD-10-CM

## 2023-12-06 NOTE — Assessment & Plan Note (Signed)
 On lifelong xarelto . Continue same.

## 2023-12-06 NOTE — Assessment & Plan Note (Signed)
 BP Readings from Last 3 Encounters:  12/06/23 128/73  11/07/23 (!) 88/53  05/22/23 135/89   Currently on valsartan  only, with good BP.

## 2023-12-06 NOTE — Progress Notes (Unsigned)
 Subjective:     Patient ID: Sharon Rivers, female    DOB: May 09, 1970, 53 y.o.   MRN: [id number]  Chief Complaint  Patient presents with   Annual Exam    HPI  Discussed the use of AI scribe software for clinical note transcription with the patient, who gave verbal consent to proceed.  History of Present Illness Sharon Rivers is a 53 year old female who presents for an updated physical exam and concerns about family history of Parkinson's, Alzheimer's, and arthritis.  She is worried about her family history of Parkinson's, Alzheimer's, and arthritis, particularly as her grandmother had all three conditions. She experiences her hands 'drawing up' while working and seeks evaluation for potential early detection of these conditions.  She describes a hard 'knot' in her upper body and a large mole on her back that has recently appeared. Her mood is variable, with fluctuations attributed to past work-related stress. She takes sertraline  100 mg for depression and Xanax  for anxiety.  She experiences soreness in the right side of her back, similar to when she 'cracked a rib,' which began after back surgery. She uses a CPAP machine and finds it essential for sleep.  She experiences 'sulfur burps' associated with her use of Wegovy , which she started in March 2025 and recently increased the dose. Her weight has increased since the last visit, attributed to recent birthday celebrations.  She takes valsartan  for blood pressure and Xarelto  for blood clot prevention. She reports dry skin and small bumps on her arms, with no recent changes in products. She does not smoke and drinks alcohol  infrequently.  Immunizations: will consider Heplisav- we are out today, declines prevnar, declines flu shot Diet: healthier Exercise:  started after her back surgery Colonoscopy: cologuard due 7/27 Pap Smear: will complete today Mammogram: 6/25 Vision: up to date Dental: edentulous        Health  Maintenance Due  Topic Date Due   Hepatitis B Vaccines 19-59 Average Risk (1 of 3 - 19+ 3-dose series) Never done    Past Medical History:  Diagnosis Date   Anxiety    Back pain    Chest pain    a. 07/2015 Myoview : EF 71%, medium size, mild intensity, partially reversible septal defect with overlying breast attenuation -> likely artifact. No significant reversible ischemia.   Constipation    Depression    Edema    feet and legs   Frequent headaches    GERD (gastroesophageal reflux disease)    History of DVT (deep vein thrombosis) 2018   Hypertension    Hypothyroidism    Insulin  resistance    Joint pain    Migraines    OSA (obstructive sleep apnea) 03/17/2016   Osteoarthritis    Panic anxiety syndrome    PONV (postoperative nausea and vomiting)    Pulmonary embolism (HCC) 2022   SOB (shortness of breath)    a. 04/2016 Echo: EF 60-65%, Gr1 DD.   Swallowing difficulty    Trigeminal neuralgia 04/21/2020    Past Surgical History:  Procedure Laterality Date   CESAREAN SECTION  2001 & 2002   DILATION AND CURETTAGE OF UTERUS N/A 09/08/2020   Procedure: DILATATION AND CURETTAGE;  Surgeon: Corene Coy, MD;  Location: [facility] OR;  Service: Gynecology;  Laterality: N/A;   ELBOW SURGERY     HYSTEROSCOPY WITH NOVASURE N/A 09/08/2020   Procedure: HYSTEROSCOPY WITH NOVASURE;  Surgeon: Corene Coy, MD;  Location: [facility] OR;  Service: Gynecology;  Laterality: N/A;   LUMBAR LAMINECTOMY/DECOMPRESSION  MICRODISCECTOMY Left 05/22/2023   Procedure: Microdiscectomy - Lumbar Five-Sacral One - left;  Surgeon: Onetha Kuba, MD;  Location: [facility] OR;  Service: Neurosurgery;  Laterality: Left;  Microdiscectomy - L5-S1 - left   MINOR CARPAL TUNNEL     pinched nerve in elbow   WISDOM TOOTH EXTRACTION      Family History  Problem Relation Age of Onset   Hypertension Mother        Living   Arthritis Mother    Thyroid  disease Mother    Heart disease Mother        MI at age 78   Heart  attack Mother    Migraines Mother    Depression Mother    Obesity Mother    Gallstones Mother    CAD Mother    Neuropathy Mother    Peripheral Artery Disease Mother    Congestive Heart Failure Father        ?CHF   Gout Brother    Hypertension Maternal Grandmother    Parkinson's disease Maternal Grandmother    Alzheimer's disease Maternal Grandmother    Arthritis Maternal Grandmother    Pancreatitis Maternal Grandmother    Cancer Maternal Grandfather        metastatic, primary unknown   Hypertension Maternal Grandfather        smoker/drinker   CAD Maternal Grandfather    Hypertension Paternal Grandmother    Hypertension Paternal Grandfather    ADD / ADHD Son        x1   Other Son        #2-Unknown   Diabetes Neg Hx     Social History   Socioeconomic History   Marital status: Single    Spouse name: Not on file   Number of children: 2   Years of education: Not on file   Highest education level: Associate degree: occupational, scientist, product/process development, or vocational program  Occupational History   Occupation: Tree Surgeon    Comment: [employer]  Tobacco Use   Smoking status: Former    Current packs/day: 0.00    Types: Cigarettes    Quit date: 1992    Years since quitting: 33.8   Smokeless tobacco: Never   Tobacco comments:    quit 1992  Vaping Use   Vaping status: Never Used  Substance and Sexual Activity   Alcohol  use: No    Alcohol /week: 0.0 standard drinks of alcohol    Drug use: No   Sexual activity: Yes    Partners: Male    Birth control/protection: None  Other Topics Concern   Not on file  Social History Narrative   She has 2 children (grown). She did not retain custody of these children.   Lives with boyfriend in [place].   Works in audiological scientist at [employer]: Low Risk  (11/29/2023)   Overall Financial Resource Strain (CARDIA)    Difficulty of Paying Living Expenses: Not hard at all  Food Insecurity: No Food  Insecurity (11/29/2023)   Hunger Vital Sign    Worried About Running Out of Food in the Last Year: Never true    Ran Out of Food in the Last Year: Never true  Transportation Needs: No Transportation Needs (11/29/2023)   PRAPARE - Administrator, Civil Service (Medical): No    Lack of Transportation (Non-Medical): No  Physical Activity: Inactive (11/29/2023)   Exercise Vital Sign    Days of Exercise per Week: 0 days  Minutes of Exercise per Session: Not on file  Stress: Stress Concern Present (11/29/2023)   [employer] of Occupational Health - Occupational Stress Questionnaire    Feeling of Stress: To some extent  Social Connections: Moderately Isolated (11/29/2023)   Social Connection and Isolation Panel    Frequency of Communication with Friends and Family: More than three times a week    Frequency of Social Gatherings with Friends and Family: Three times a week    Attends Religious Services: Never    Active Member of Clubs or Organizations: No    Attends Banker Meetings: Not on file    Marital Status: Living with partner  Intimate Partner Violence: Unknown (07/15/2022)   Received from [facility]   HITS    Physically Hurt: Not on file    Insult or Talk Down To: Not on file    Threaten Physical Harm: Not on file    Scream or Curse: Not on file    Outpatient Medications Prior to Visit  Medication Sig Dispense Refill   acetaminophen  (TYLENOL ) 325 MG tablet Take 2 tablets (650 mg total) by mouth every 6 (six) hours as needed for mild pain (or Fever >/= 101). 20 tablet 0   ALPRAZolam  (XANAX ) 0.25 MG tablet TAKE 1 TABLET BY MOUTH TWICE DAILY AS NEEDED FOR ANXIETY AND  FOR  SLEEP 30 tablet 0   Cholecalciferol  (VITAMIN D3) 50 MCG (2000 UT) capsule Take 1 capsule (2,000 Units total) by mouth daily.     docusate sodium  (COLACE) 100 MG capsule Take 100 mg by mouth 2 (two) times daily as needed for mild constipation.     furosemide  (LASIX ) 20 MG tablet  Take 1 tablet by mouth once daily 90 tablet 3   levothyroxine  (SYNTHROID ) 200 MCG tablet Take 1 tablet (200 mcg total) by mouth daily. 90 tablet 0   levothyroxine  (SYNTHROID ) 25 MCG tablet Take 1 tablet (25 mcg total) by mouth daily. 90 tablet 0   methocarbamol  (ROBAXIN ) 500 MG tablet Take 1 tablet (500 mg total) by mouth every 8 (eight) hours as needed for muscle spasms. 40 tablet 1   montelukast  (SINGULAIR ) 10 MG tablet Take 1 tablet (10 mg total) by mouth at bedtime. 90 tablet 1   Multiple Vitamins-Minerals (MULTIVITAMIN WITH MINERALS) tablet Take 1 tablet by mouth daily.     nystatin  (MYCOSTATIN /NYSTOP ) powder Apply 1 application topically 3 (three) times daily. (Patient taking differently: Apply 1 application  topically 3 (three) times daily as needed (irritation).) 60 g 1   ondansetron  (ZOFRAN ) 4 MG tablet Take 1 tablet (4 mg total) by mouth every 6 (six) hours as needed for nausea. 20 tablet 1   Polyvinyl Alcohol -Povidone PF (REFRESH) 1.4-0.6 % SOLN Place 1-2 drops into both eyes 3 (three) times daily as needed (dry/irritated eyes).     potassium chloride  (KLOR-CON ) 10 MEQ tablet Take 1 tablet (10 mEq total) by mouth daily. 90 tablet 1   rivaroxaban  (XARELTO ) 20 MG TABS tablet Take 1 tablet (20 mg total) by mouth daily with supper. 90 tablet 1   rizatriptan  (MAXALT ) 10 MG tablet As needed for headache; may repeat in 2 hours if needed; max 2 per day or 8 per month 8 tablet 6   semaglutide -weight management (WEGOVY ) 0.5 MG/0.5ML SOAJ SQ injection Inject 0.5 mg into the skin once a week. 50 units weekly from [facility] (comp[facility])     sertraline  (ZOLOFT ) 100 MG tablet Take 1 tablet (100 mg total) by mouth daily.  90 tablet 1   topiramate  (TOPAMAX ) 50 MG tablet Take 1 tablet (50 mg total) by mouth 2 (two) times daily. 180 tablet 1   traZODone  (DESYREL ) 100 MG tablet Take 1 tablet (100 mg total) by mouth at bedtime. 90 tablet 0   valsartan  (DIOVAN ) 320 MG tablet Take 1  tablet (320 mg total) by mouth daily. 90 tablet 1   Vitamin D , Ergocalciferol , (DRISDOL ) 1.25 MG (50000 UNIT) CAPS capsule Take 1 capsule (50,000 Units total) by mouth every 7 (seven) days. 12 capsule 0   azelastine  (ASTELIN ) 0.1 % nasal spray Place 1 spray into both nostrils 2 (two) times daily. Use in each nostril as directed (Patient taking differently: Place 1 spray into both nostrils 2 (two) times daily as needed for rhinitis or allergies. Use in each nostril as directed) 30 mL 5   chlorhexidine  (HIBICLENS ) 4 % external liquid Apply 15 mLs (1 Application total) topically as directed for 30 doses. Use as directed daily for 5 days every other week for 6 weeks. 946 mL 1   No facility-administered medications prior to visit.    Allergies  Allergen Reactions   Megace  [Megestrol ] Other (See Comments)    Pt is s/p PE and DVT on low dose Megace    Seroquel [Quetiapine] Itching   Adhesive [Tape] Other (See Comments)    Skin Burn. DO NOT USE PAPER TAPE , BUT surgical tape is ok    Biofreeze [Menthol  (Topical Analgesic)] Other (See Comments)    Skin Burn.   Camphor Other (See Comments)    Burning sensation.   Folic Acid  Hives   Lisinopril  Cough   Menthol  Other (See Comments)    Burning sensation.   Sulfa Antibiotics Rash    Review of Systems  Constitutional:  Negative for weight loss.  HENT:  Negative for congestion and hearing loss.   Eyes:  Negative for blurred vision.  Respiratory:  Negative for cough.   Cardiovascular:  Negative for leg swelling.  Gastrointestinal:  Negative for constipation and diarrhea.  Genitourinary:  Negative for dysuria and frequency.  Musculoskeletal:  Positive for back pain and joint pain.  Skin:  Negative for rash.  Neurological:  Negative for headaches.  Psychiatric/Behavioral:         See HPI       Objective:    Physical Exam   BP 128/73 (BP Location: Right Arm, Patient Position: Sitting, Cuff Size: Large)   Pulse 70   Temp 98.6 F (37 C)  (Oral)   Resp 16   Ht 5' 2 (1.575 m)   Wt 273 lb (123.8 kg)   LMP  (LMP Unknown) Comment: pt is unaware of last cycle  SpO2 100%   BMI 49.93 kg/m  Wt Readings from Last 3 Encounters:  12/06/23 273 lb (123.8 kg)  11/07/23 266 lb (120.7 kg)  05/22/23 296 lb (134.3 kg)   Physical Exam  Constitutional: She is oriented to person, place, and time. She appears well-developed and well-nourished. No distress.  HENT:  Head: Normocephalic and atraumatic.  Right Ear: Tympanic membrane and ear canal normal.  Left Ear: Tympanic membrane and ear canal normal.  Mouth/Throat: Oropharynx is clear and moist.  Eyes: Pupils are equal, round, and reactive to light. No scleral icterus.  Neck: Normal range of motion. No thyromegaly present.  Cardiovascular: Normal rate and regular rhythm.   No murmur heard. Pulmonary/Chest: Effort normal and breath sounds normal. No respiratory distress. He has no wheezes. She has no rales. She exhibits no tenderness.  Abdominal: Soft. Bowel sounds are normal. She exhibits no distension and no mass. There is no tenderness. There is no rebound and no guarding.  Musculoskeletal: She exhibits no edema.  Lymphadenopathy:    She has no cervical adenopathy.  Neurological: She is alert and oriented to person, place, and time. She has normal patellar reflexes. She exhibits normal muscle tone. Coordination normal.  Skin: Skin is warm and dry. Benign appearing skin tag mid back. ubcutaneous cyst right lateral ribcage Psychiatric: She has a normal mood and affect. Her behavior is normal. Judgment and thought content normal.  Breasts: Examined lying Right: Without masses, retractions, discharge or axillary adenopathy.  Left: Without masses, retractions, discharge or axillary adenopathy.  Inguinal/mons: Normal without inguinal adenopathy  External genitalia: Normal  BUS/Urethra/Skene's glands: Normal  Bladder: Normal  Vagina: Normal  Cervix: Normal  Uterus: normal in size,  shape and contour. Midline and mobile  Adnexa/parametria:  Rt: Without masses or tenderness.  Lt: Without masses or tenderness.  Anus and perineum: Normal            Assessment & Plan:       Assessment & Plan:   Problem List Items Addressed This Visit       Unprioritized   Vitamin D  deficiency   Continues vit D 50000 international units weekly and plan to repeat vit D after Christmas.       Preventative health care    Routine wellness visit with family history of Parkinson's, Alzheimer's, and arthritis. Emphasized blood pressure control, healthy diet, alcohol  avoidance and exercise for brain health. - Declines flu shot and pneumonia vaccine. - Performed Pap smear. - Performed breast exam. - Discuss hepatitis B vaccination at next visit (we are out of stock today.        OSA (obstructive sleep apnea)   CPAP is going well. Good compliance.      Morbid obesity (HCC)   Maintained on Wegovy .  Discussed importance of diet exercise and weight loss. Follow back up in 3 months for visit with weight check.       Hypothyroid   Lab Results  Component Value Date   TSH 0.73 11/07/2023   TSH stable on sythroid 225 mcg daily.      Hypertension   BP Readings from Last 3 Encounters:  12/06/23 128/73  11/07/23 (!) 88/53  05/22/23 135/89   Currently on valsartan  only, with good BP.       History of DVT (deep vein thrombosis)   On lifelong xarelto . Continue same.       Depression   Fair control- will look into counseling with employer. Continue sertraline  100mg .       Anxiety   Improving, continue sertraline .       Other Visit Diagnoses       Arthritis    -  Primary   Relevant Orders   Rheumatoid Factor   Sedimentation rate     Encounter for Papanicolaou smear of vagina as part of routine gynecological examination       Relevant Orders   Cytology - PAP( [facility])       I am having Jamee Goldberg maintain her azelastine , acetaminophen , nystatin ,  docusate sodium , Refresh, Vitamin D3, multivitamin with minerals, furosemide , chlorhexidine , methocarbamol , ALPRAZolam , semaglutide -weight management, montelukast , rivaroxaban , valsartan , topiramate , rizatriptan , potassium chloride , ondansetron , levothyroxine , levothyroxine , Vitamin D  (Ergocalciferol ), sertraline , and traZODone .  No orders of the defined types were placed in this encounter.

## 2023-12-06 NOTE — Assessment & Plan Note (Signed)
 Continues vit D 50000 international units weekly and plan to repeat vit D after Christmas.

## 2023-12-06 NOTE — Assessment & Plan Note (Signed)
 Fair control- will look into counseling with employer. Continue sertraline  100mg .

## 2023-12-06 NOTE — Assessment & Plan Note (Signed)
  Routine wellness visit with family history of Parkinson's, Alzheimer's, and arthritis. Emphasized blood pressure control, healthy diet, alcohol  avoidance and exercise for brain health. - Declines flu shot and pneumonia vaccine. - Performed Pap smear. - Performed breast exam. - Discuss hepatitis B vaccination at next visit (we are out of stock today.

## 2023-12-06 NOTE — Assessment & Plan Note (Signed)
 Improving, continue sertraline .

## 2023-12-06 NOTE — Assessment & Plan Note (Addendum)
 Maintained on Wegovy .  Discussed importance of diet exercise and weight loss. Follow back up in 3 months for visit with weight check.

## 2023-12-06 NOTE — Assessment & Plan Note (Signed)
 Lab Results  Component Value Date   TSH 0.73 11/07/2023   TSH stable on sythroid 225 mcg daily.

## 2023-12-06 NOTE — Assessment & Plan Note (Signed)
 CPAP is going well. Good compliance.

## 2023-12-07 LAB — SEDIMENTATION RATE: Sed Rate: 4 mm/h (ref 0–30)

## 2023-12-08 LAB — CYTOLOGY - PAP
Comment: NEGATIVE
Diagnosis: NEGATIVE
High risk HPV: NEGATIVE

## 2023-12-09 LAB — RHEUMATOID FACTOR

## 2023-12-09 LAB — EXTRA SPECIMEN

## 2023-12-12 ENCOUNTER — Ambulatory Visit: Payer: Self-pay

## 2023-12-12 ENCOUNTER — Other Ambulatory Visit

## 2023-12-12 DIAGNOSIS — M1990 Unspecified osteoarthritis, unspecified site: Secondary | ICD-10-CM

## 2023-12-12 DIAGNOSIS — E559 Vitamin D deficiency, unspecified: Secondary | ICD-10-CM

## 2023-12-13 LAB — VITAMIN D 25 HYDROXY (VIT D DEFICIENCY, FRACTURES): VITD: 24.38 ng/mL — ABNORMAL LOW (ref 30.00–100.00)

## 2023-12-13 LAB — RHEUMATOID FACTOR: Rheumatoid fact SerPl-aCnc: <10 [IU]/mL (ref <14)

## 2023-12-14 ENCOUNTER — Ambulatory Visit: Payer: Self-pay | Admitting: Family

## 2023-12-14 DIAGNOSIS — E559 Vitamin D deficiency, unspecified: Secondary | ICD-10-CM

## 2023-12-14 MED ORDER — VITAMIN D (ERGOCALCIFEROL) 1.25 MG (50000 UNIT) PO CAPS: 50000.0000 [IU] | ORAL_CAPSULE | ORAL | 0 refills | Status: AC

## 2023-12-14 NOTE — Telephone Encounter (Signed)
 Vitamin D  is better but still low.  Please continue high dose vit D weekly for 12 more weeks, then repeat vit D level.

## 2023-12-15 NOTE — Progress Notes (Signed)
 Patient notified of results and provider's advise.

## 2023-12-16 ENCOUNTER — Other Ambulatory Visit: Payer: Self-pay | Admitting: Family

## 2023-12-16 DIAGNOSIS — F419 Anxiety disorder, unspecified: Secondary | ICD-10-CM

## 2023-12-18 NOTE — Telephone Encounter (Signed)
 Requesting: alprazolam  0.25mg  Contract: 11/14/23 UDS: 09/02/22 Last Visit: 12/06/23 Next Visit: 03/12/24 Last Refill: 10/28/23 #30 and 0RF  Please Advise

## 2024-01-20 ENCOUNTER — Other Ambulatory Visit: Payer: Self-pay | Admitting: Family

## 2024-01-20 DIAGNOSIS — F419 Anxiety disorder, unspecified: Secondary | ICD-10-CM

## 2024-01-22 NOTE — Telephone Encounter (Signed)
 Requesting: alprazolam  0.25mg  Contract: 11/14/23 UDS: 09/02/22 Last Visit: 12/06/23 Next Visit: 03/12/24 Last Refill: 12/18/23 #30 and 0RF   Please Advise

## 2024-01-27 ENCOUNTER — Other Ambulatory Visit: Payer: Self-pay | Admitting: Family

## 2024-01-27 DIAGNOSIS — G4700 Insomnia, unspecified: Secondary | ICD-10-CM

## 2024-02-06 ENCOUNTER — Other Ambulatory Visit: Payer: Self-pay | Admitting: Family

## 2024-02-06 DIAGNOSIS — G4700 Insomnia, unspecified: Secondary | ICD-10-CM

## 2024-02-26 ENCOUNTER — Encounter: Payer: Self-pay | Admitting: Family

## 2024-02-26 DIAGNOSIS — Z0000 Encounter for general adult medical examination without abnormal findings: Secondary | ICD-10-CM

## 2024-03-12 ENCOUNTER — Ambulatory Visit: Admitting: Family

## 2024-11-13 ENCOUNTER — Ambulatory Visit: Admitting: Physician Assistant
